# Patient Record
Sex: Male | Born: 1935 | Race: White | Hispanic: No | Smoking: Former smoker
Health system: Southern US, Community
[De-identification: ages and names within clinical notes are randomized; demographics above are authoritative.]

## PROBLEM LIST (undated history)

## (undated) DIAGNOSIS — L97409 Non-pressure chronic ulcer of unspecified heel and midfoot with unspecified severity: Secondary | ICD-10-CM

## (undated) DIAGNOSIS — I219 Acute myocardial infarction, unspecified: Secondary | ICD-10-CM

## (undated) DIAGNOSIS — I10 Essential (primary) hypertension: Secondary | ICD-10-CM

## (undated) DIAGNOSIS — N400 Enlarged prostate without lower urinary tract symptoms: Secondary | ICD-10-CM

## (undated) DIAGNOSIS — N183 Chronic kidney disease, stage 3 unspecified: Secondary | ICD-10-CM

## (undated) DIAGNOSIS — I739 Peripheral vascular disease, unspecified: Secondary | ICD-10-CM

## (undated) DIAGNOSIS — E7800 Pure hypercholesterolemia, unspecified: Secondary | ICD-10-CM

## (undated) DIAGNOSIS — I495 Sick sinus syndrome: Secondary | ICD-10-CM

## (undated) DIAGNOSIS — L97509 Non-pressure chronic ulcer of other part of unspecified foot with unspecified severity: Secondary | ICD-10-CM

## (undated) DIAGNOSIS — E119 Type 2 diabetes mellitus without complications: Secondary | ICD-10-CM

## (undated) DIAGNOSIS — J189 Pneumonia, unspecified organism: Secondary | ICD-10-CM

## (undated) DIAGNOSIS — I509 Heart failure, unspecified: Secondary | ICD-10-CM

## (undated) DIAGNOSIS — Z950 Presence of cardiac pacemaker: Secondary | ICD-10-CM

## (undated) DIAGNOSIS — E785 Hyperlipidemia, unspecified: Secondary | ICD-10-CM

## (undated) DIAGNOSIS — E1140 Type 2 diabetes mellitus with diabetic neuropathy, unspecified: Secondary | ICD-10-CM

## (undated) DIAGNOSIS — I2510 Atherosclerotic heart disease of native coronary artery without angina pectoris: Secondary | ICD-10-CM

## (undated) HISTORY — PX: INSERT / REPLACE / REMOVE PACEMAKER: SUR710

## (undated) HISTORY — PX: PACEMAKER INSERTION: SHX728

## (undated) HISTORY — DX: Hyperlipidemia, unspecified: E78.5

## (undated) HISTORY — PX: HERNIA REPAIR: SHX51

## (undated) HISTORY — DX: Type 2 diabetes mellitus with diabetic neuropathy, unspecified: E11.40

## (undated) HISTORY — PX: CARDIAC CATHETERIZATION: SHX172

## (undated) HISTORY — PX: CATARACT EXTRACTION W/ INTRAOCULAR LENS IMPLANT: SHX1309

## (undated) HISTORY — DX: Sick sinus syndrome: I49.5

## (undated) HISTORY — DX: Atherosclerotic heart disease of native coronary artery without angina pectoris: I25.10

## (undated) HISTORY — PX: CAROTID ENDARTERECTOMY: SUR193

## (undated) HISTORY — PX: CATARACT EXTRACTION, BILATERAL: SHX1313

## (undated) HISTORY — DX: Essential (primary) hypertension: I10

---

## 1994-01-21 HISTORY — PX: CORONARY ARTERY BYPASS GRAFT: SHX141

## 1999-02-04 ENCOUNTER — Inpatient Hospital Stay (HOSPITAL_COMMUNITY): Admission: EM | Admit: 1999-02-04 | Discharge: 1999-02-07 | Payer: Self-pay | Admitting: Cardiology

## 1999-08-17 ENCOUNTER — Encounter: Payer: Self-pay | Admitting: Internal Medicine

## 1999-08-17 ENCOUNTER — Inpatient Hospital Stay (HOSPITAL_COMMUNITY): Admission: EM | Admit: 1999-08-17 | Discharge: 1999-08-24 | Payer: Self-pay | Admitting: Emergency Medicine

## 2001-06-23 ENCOUNTER — Encounter: Payer: Self-pay | Admitting: *Deleted

## 2001-06-24 ENCOUNTER — Encounter (INDEPENDENT_AMBULATORY_CARE_PROVIDER_SITE_OTHER): Payer: Self-pay | Admitting: Specialist

## 2001-06-24 ENCOUNTER — Inpatient Hospital Stay (HOSPITAL_COMMUNITY): Admission: RE | Admit: 2001-06-24 | Discharge: 2001-06-26 | Payer: Self-pay | Admitting: *Deleted

## 2003-12-16 ENCOUNTER — Ambulatory Visit: Payer: Self-pay | Admitting: Cardiology

## 2004-03-23 ENCOUNTER — Ambulatory Visit: Payer: Self-pay | Admitting: Cardiology

## 2004-04-24 ENCOUNTER — Ambulatory Visit: Payer: Self-pay | Admitting: Cardiology

## 2004-05-24 ENCOUNTER — Ambulatory Visit: Payer: Self-pay | Admitting: Cardiology

## 2004-06-28 ENCOUNTER — Ambulatory Visit: Payer: Self-pay | Admitting: Cardiology

## 2004-08-06 ENCOUNTER — Ambulatory Visit: Payer: Self-pay | Admitting: Cardiology

## 2004-09-07 ENCOUNTER — Ambulatory Visit: Payer: Self-pay | Admitting: Cardiology

## 2004-10-12 ENCOUNTER — Ambulatory Visit: Payer: Self-pay | Admitting: Cardiology

## 2004-10-24 ENCOUNTER — Ambulatory Visit: Payer: Self-pay | Admitting: Cardiology

## 2004-11-12 ENCOUNTER — Ambulatory Visit: Payer: Self-pay | Admitting: Cardiology

## 2004-12-17 ENCOUNTER — Ambulatory Visit: Payer: Self-pay | Admitting: Cardiology

## 2005-01-17 ENCOUNTER — Ambulatory Visit: Payer: Self-pay | Admitting: Cardiology

## 2005-02-19 ENCOUNTER — Ambulatory Visit: Payer: Self-pay | Admitting: Cardiology

## 2005-03-22 ENCOUNTER — Ambulatory Visit: Payer: Self-pay | Admitting: Cardiology

## 2005-05-17 ENCOUNTER — Ambulatory Visit: Payer: Self-pay | Admitting: Cardiology

## 2005-06-13 ENCOUNTER — Ambulatory Visit: Payer: Self-pay | Admitting: Cardiology

## 2005-07-15 ENCOUNTER — Ambulatory Visit: Payer: Self-pay | Admitting: Cardiology

## 2005-08-12 ENCOUNTER — Ambulatory Visit: Payer: Self-pay | Admitting: Cardiology

## 2005-09-16 ENCOUNTER — Ambulatory Visit: Payer: Self-pay | Admitting: Cardiology

## 2005-10-14 ENCOUNTER — Ambulatory Visit: Payer: Self-pay | Admitting: Cardiology

## 2005-11-12 ENCOUNTER — Ambulatory Visit: Payer: Self-pay | Admitting: Cardiology

## 2005-12-10 ENCOUNTER — Ambulatory Visit: Payer: Self-pay | Admitting: Cardiology

## 2006-01-07 ENCOUNTER — Ambulatory Visit: Payer: Self-pay | Admitting: Cardiology

## 2006-02-04 ENCOUNTER — Ambulatory Visit: Payer: Self-pay | Admitting: Cardiology

## 2006-03-04 ENCOUNTER — Ambulatory Visit: Payer: Self-pay | Admitting: Cardiology

## 2006-04-01 ENCOUNTER — Ambulatory Visit: Payer: Self-pay | Admitting: Cardiology

## 2006-04-22 ENCOUNTER — Ambulatory Visit: Payer: Self-pay | Admitting: Cardiology

## 2006-04-29 ENCOUNTER — Ambulatory Visit: Payer: Self-pay | Admitting: Cardiology

## 2006-05-27 ENCOUNTER — Ambulatory Visit: Payer: Self-pay | Admitting: Cardiology

## 2006-06-24 ENCOUNTER — Ambulatory Visit: Payer: Self-pay | Admitting: Cardiology

## 2006-07-22 ENCOUNTER — Ambulatory Visit: Payer: Self-pay | Admitting: Cardiology

## 2006-08-19 ENCOUNTER — Ambulatory Visit: Payer: Self-pay | Admitting: Cardiology

## 2006-09-16 ENCOUNTER — Ambulatory Visit: Payer: Self-pay | Admitting: Cardiology

## 2006-10-14 ENCOUNTER — Ambulatory Visit: Payer: Self-pay | Admitting: Cardiology

## 2006-11-11 ENCOUNTER — Ambulatory Visit: Payer: Self-pay | Admitting: Cardiology

## 2006-12-09 ENCOUNTER — Ambulatory Visit: Payer: Self-pay | Admitting: Cardiology

## 2007-01-06 ENCOUNTER — Ambulatory Visit: Payer: Self-pay | Admitting: Cardiology

## 2007-02-02 ENCOUNTER — Ambulatory Visit: Payer: Self-pay | Admitting: Cardiology

## 2007-04-22 ENCOUNTER — Ambulatory Visit: Payer: Self-pay | Admitting: Cardiology

## 2007-04-28 ENCOUNTER — Ambulatory Visit: Payer: Self-pay

## 2007-05-05 ENCOUNTER — Ambulatory Visit: Payer: Self-pay | Admitting: Cardiology

## 2007-05-12 ENCOUNTER — Ambulatory Visit: Payer: Self-pay | Admitting: Cardiology

## 2007-08-04 ENCOUNTER — Ambulatory Visit: Payer: Self-pay | Admitting: Cardiology

## 2007-10-20 ENCOUNTER — Ambulatory Visit: Payer: Self-pay | Admitting: Cardiology

## 2007-11-03 ENCOUNTER — Ambulatory Visit: Payer: Self-pay | Admitting: Cardiology

## 2008-02-02 ENCOUNTER — Ambulatory Visit: Payer: Self-pay | Admitting: Cardiology

## 2008-04-15 DIAGNOSIS — E119 Type 2 diabetes mellitus without complications: Secondary | ICD-10-CM | POA: Insufficient documentation

## 2008-04-15 DIAGNOSIS — I495 Sick sinus syndrome: Secondary | ICD-10-CM

## 2008-04-15 DIAGNOSIS — E1142 Type 2 diabetes mellitus with diabetic polyneuropathy: Secondary | ICD-10-CM | POA: Insufficient documentation

## 2008-04-15 DIAGNOSIS — I2510 Atherosclerotic heart disease of native coronary artery without angina pectoris: Secondary | ICD-10-CM | POA: Insufficient documentation

## 2008-04-15 DIAGNOSIS — I10 Essential (primary) hypertension: Secondary | ICD-10-CM | POA: Insufficient documentation

## 2008-04-15 DIAGNOSIS — E785 Hyperlipidemia, unspecified: Secondary | ICD-10-CM | POA: Insufficient documentation

## 2008-04-26 ENCOUNTER — Encounter: Payer: Self-pay | Admitting: Cardiology

## 2008-04-26 ENCOUNTER — Ambulatory Visit: Payer: Self-pay | Admitting: Cardiology

## 2008-05-03 ENCOUNTER — Ambulatory Visit: Payer: Self-pay | Admitting: Cardiology

## 2008-05-09 ENCOUNTER — Encounter: Payer: Self-pay | Admitting: Cardiology

## 2008-08-02 ENCOUNTER — Ambulatory Visit: Payer: Self-pay | Admitting: Cardiology

## 2008-09-01 ENCOUNTER — Encounter (INDEPENDENT_AMBULATORY_CARE_PROVIDER_SITE_OTHER): Payer: Self-pay | Admitting: *Deleted

## 2008-11-01 ENCOUNTER — Ambulatory Visit: Payer: Self-pay | Admitting: Cardiology

## 2008-11-01 DIAGNOSIS — R609 Edema, unspecified: Secondary | ICD-10-CM | POA: Insufficient documentation

## 2008-11-01 DIAGNOSIS — R42 Dizziness and giddiness: Secondary | ICD-10-CM | POA: Insufficient documentation

## 2009-01-31 ENCOUNTER — Ambulatory Visit: Payer: Self-pay | Admitting: Cardiology

## 2009-04-28 ENCOUNTER — Ambulatory Visit: Payer: Self-pay | Admitting: Cardiology

## 2009-05-02 ENCOUNTER — Ambulatory Visit: Payer: Self-pay | Admitting: Cardiology

## 2009-08-01 ENCOUNTER — Ambulatory Visit: Payer: Self-pay | Admitting: Cardiology

## 2009-08-17 ENCOUNTER — Encounter (INDEPENDENT_AMBULATORY_CARE_PROVIDER_SITE_OTHER): Payer: Self-pay | Admitting: *Deleted

## 2009-09-11 ENCOUNTER — Encounter (INDEPENDENT_AMBULATORY_CARE_PROVIDER_SITE_OTHER): Payer: Self-pay | Admitting: *Deleted

## 2009-10-20 ENCOUNTER — Encounter: Payer: Self-pay | Admitting: Cardiology

## 2009-10-31 ENCOUNTER — Ambulatory Visit: Payer: Self-pay | Admitting: Cardiology

## 2009-11-17 ENCOUNTER — Encounter: Payer: Self-pay | Admitting: Cardiology

## 2009-11-17 ENCOUNTER — Ambulatory Visit: Payer: Self-pay | Admitting: Cardiology

## 2009-11-23 ENCOUNTER — Encounter: Payer: Self-pay | Admitting: Cardiology

## 2009-11-23 LAB — CONVERTED CEMR LAB
BUN: 31 mg/dL — ABNORMAL HIGH (ref 6–23)
CO2: 23 meq/L (ref 19–32)
Calcium: 8.4 mg/dL (ref 8.4–10.5)
Chloride: 104 meq/L (ref 96–112)
Creatinine, Ser: 1.39 mg/dL (ref 0.40–1.50)
Glucose, Bld: 99 mg/dL (ref 70–99)
Potassium: 5.8 meq/L — ABNORMAL HIGH (ref 3.5–5.3)
Sodium: 140 meq/L (ref 135–145)

## 2010-01-02 ENCOUNTER — Encounter: Payer: Self-pay | Admitting: Cardiology

## 2010-01-30 ENCOUNTER — Encounter: Payer: Self-pay | Admitting: Internal Medicine

## 2010-02-13 ENCOUNTER — Ambulatory Visit: Payer: Self-pay | Admitting: Cardiology

## 2010-02-14 ENCOUNTER — Ambulatory Visit: Payer: Self-pay | Admitting: Cardiology

## 2010-02-22 NOTE — Cardiovascular Report (Signed)
Summary: TTM   TTM   Imported By: Roderic Ovens 10/19/2009 16:12:39  _____________________________________________________________________  External Attachment:    Type:   Image     Comment:   External Document

## 2010-02-22 NOTE — Letter (Signed)
Summary: Device-Delinquent Check  [facility], Main Office  [address]   [place], [place] [address]   Phone: [phone]  Fax: [phone]     September 11, 2009 MRN: [id number]   IZYAN EZZELL [address] [place], [place]  [address]   Dear Mr. HALLQUIST,  According to our records, you have not had your implanted device checked in the recommended period of time.  We are unable to determine appropriate device function without checking your device on a regular basis.  Please call our office to schedule an appointment, with Dr. Juanda Chance,  as soon as possible.  If you are having your device checked by another physician, please call us so that we may update our records.  Thank you,  Altha Harm, LPN  September 11, 2009 9:09 AM  [facility]  certified mail

## 2010-02-22 NOTE — Cardiovascular Report (Signed)
Summary: Certified Letter Signed - Patient (Appt. Scheduled 11/17/09)  Certified Letter Signed - Patient (Appt. Scheduled 11/17/09)   Imported By: Debby Freiberg 11/20/2009 15:59:19  _____________________________________________________________________  External Attachment:    Type:   Image     Comment:   External Document

## 2010-02-22 NOTE — Letter (Signed)
Summary: Device-Delinquent Phone Journalist, newspaper, Main Office  [address]   [place], [place] [address]   Phone: [phone]  Fax: [phone]     August 17, 2009 MRN: [id number]   ALBERT HERSCH [address] [place], [place]  [address]   Dear Mr. ANSELMI,  According to our records, you were scheduled for a device phone transmission on                                 12-21-2008.  We did not receive any results from this check.  If you transmitted on your scheduled day, please call us to help troubleshoot your system.  If you forgot to send your transmission, please send one upon receipt of this letter.  Thank you,   Architectural technologist [facility]

## 2010-02-22 NOTE — Cardiovascular Report (Signed)
Summary: TTM   TTM   Imported By: Roderic Ovens 01/17/2010 15:42:15  _____________________________________________________________________  External Attachment:    Type:   Image     Comment:   External Document

## 2010-02-22 NOTE — Assessment & Plan Note (Signed)
Summary: per check out/sf   Visit Type:  1 yr f/u Primary Provider:  Ammie Ferrier  CC:  sob and pt states he gets exhausted easily.Marland Kitchendenies any other complaints today.  History of Present Illness: [name] comes in today for further evaluation and management of his coronary artery disease, history of hypertension, dizzy spells, history of lower extremity edema, and permanent pacemaker.  His dizzy spells persist the point that he stopped his amlodipine. His blood pressures are running in the 80s. Gen. running in the 120s. His dizziness has improved.  He also discontinued his diuretic. He's had no edema.  He's very limited and getting around because of diabetic neuropathy. His son lives next to exam as well as his granddaughter. His wife is more disabled and him. He is still very sharp mentally however.  He has dyspnea on exertion but no angina. Symptoms are fairly stable. He denies any syncope or tachypalpitations.  Current Medications (verified): 1)  Glipizide Xl 5 Mg Tb24 (Glipizide) .Marland Kitchen.. 1 Tablet By Mouth Two Times A Day 2)  Simvastatin 40 Mg Tabs (Simvastatin) .... Take One Tablet By Mouth At Bedtime 3)  Doxazosin Mesylate 4 Mg Tabs (Doxazosin Mesylate) .... Take  1 Tab At Bedtime 4)  Metoprolol Tartrate 50 Mg Tabs (Metoprolol Tartrate) .... Take 1/2  Tablet By Mouth Twice A Day 5)  Aspirin Ec 325 Mg Tbec (Aspirin) .... Take One Tablet By Mouth Daily  Allergies: 1)  ! Ace Inhibitors  Past History:  Past Medical History: Last updated: 05/02/2008 HYPERLIPIDEMIA (ICD-272.4) HYPERTENSION (ICD-401.9) DM (ICD-250.00) DIABETIC PERIPHERAL NEUROPATHY (ICD-250.60) PACEMAKER, PERMANENT (ICD-V45.01) CAD (ICD-414.00)    Past Surgical History: Last updated: 02-May-2008 left carotid endarterectomy in 2003 by Dr. Liliane Bade  Family History: Last updated: 02-May-2008  His mother passed away of coronary artery disease at 68. Father passed away of a stroke at 77.  He has two brothers  who have coronary artery disease with bypass surgeries in the past.  Social History: Last updated: 05-02-08 Retired  Married  Tobacco Use - Former.  1983 approx Alcohol Use - no  Risk Factors: Smoking Status: quit (05/02/08)  Review of Systems       negative history of present illness  Vital Signs:  Patient profile:   77 year old male Height:      70 inches Weight:      211 pounds Pulse (ortho):   64 / minute Pulse rhythm:   regular BP sitting:   128 / 70  (left arm) Cuff size:   large  Vitals Entered By: Danielle Rankin, CMA (April 28, 2009 3:30 PM)  Physical Exam  General:  obese.  sitting in a wheelchair Head:  normocephalic and atraumatic Eyes:  PERRLA/EOM intact; conjunctiva and lids normal. Mouth:  Teeth, gums and palate normal. Oral mucosa normal. Neck:  Neck supple, no JVD. No masses, thyromegaly or abnormal cervical nodes. Lungs:  Clear bilaterally to auscultation and percussion. Heart:  PMI nondisplaced, soft S1-S2, no gallop or rub Msk:  decreased ROM.   Pulses:  barely palpable in the lower extremities. His toes are cool with decreased capillary reflex. There is no sign of ulcers or infection. Extremities:  he has no edema today. Neurologic:  Alert and oriented x 3. Skin:  Intact without lesions or rashes. Psych:  Normal affect.   EKG  Procedure date:  04/28/2009  Findings:      normal sinus rhythm, ventricular pacing, rate 64 beats per minute, occasional PVC  PPM Specifications Following  MD:  Everardo Beals Juanda Chance, MD     Referring MD:  Trig Mcbryar PPM Vendor:  Medtronic     PPM Model Number:  [id number]     PPM Serial Number:  [id number] PPM DOI:  08/23/1999     PPM Implanting MD:  Everardo Beals. Juanda Chance, MD  Lead 1    Location: RA     DOI: 08/23/1999     Model #: [id number]     Serial #: [id number]     Status: active Lead 2    Location: RV     DOI: 08/23/1999     Model #: [id number]     Serial #: [id number]     Status: active   Indications:  SSS   Impression &  Recommendations:  Problem # 1:  CAD (ICD-414.00) Assessment Unchanged  The following medications were removed from the medication list:    Amlodipine Besylate 5 Mg Tabs (Amlodipine besylate) .Marland Kitchen... 1 once daily His updated medication list for this problem includes:    Metoprolol Tartrate 50 Mg Tabs (Metoprolol tartrate) .Marland Kitchen... Take 1/2  tablet by mouth twice a day    Aspirin Ec 325 Mg Tbec (Aspirin) .Marland Kitchen... Take one tablet by mouth daily  Orders: EKG w/ Interpretation (93[address])  Problem # 2:  PACEMAKER, PERMANENT (ICD-V45.01) Assessment: Unchanged We will have his pacemaker interrogated today.interrogation demonstrates 22 months of battery life.  Problem # 3:  EDEMA (ICD-782.3) Assessment: Improved Continue not to take diuretic  Problem # 4:  HYPERTENSION (ICD-401.9) Assessment: Improved continue That cannot take diuretic or amlodipine. The following medications were removed from the medication list:    Amlodipine Besylate 5 Mg Tabs (Amlodipine besylate) .Marland Kitchen... 1 once daily    Hydrochlorothiazide 25 Mg Tabs (Hydrochlorothiazide) .Marland Kitchen... Take one tablet by mouth daily. His updated medication list for this problem includes:    Doxazosin Mesylate 4 Mg Tabs (Doxazosin mesylate) .Marland Kitchen... Take  1 tab at bedtime    Metoprolol Tartrate 50 Mg Tabs (Metoprolol tartrate) .Marland Kitchen... Take 1/2  tablet by mouth twice a day    Aspirin Ec 325 Mg Tbec (Aspirin) .Marland Kitchen... Take one tablet by mouth daily  Patient Instructions: 1)  Your physician recommends that you schedule a follow-up appointment in: YEAR WITH DR Lowen Barringer 2)  Your physician recommends that you continue on your current medications as directed. Please refer to the Current Medication list given to you today.

## 2010-02-22 NOTE — Assessment & Plan Note (Signed)
Summary: rov   Visit Type:  rov Primary Dazia Lippold:  Ammie Ferrier  CC:  edema/legs...sob w/exertion....denies any cp.  History of Present Illness: [name] is an 77 yo m that comes in today for further evaluation and management of his coronary artery disease, history of hypertension, dizzy spells, history of lower extremity edema, and permanent pacemaker.  he is off his BP meds given that he was hypotensive. He also discontinued his diuretic. He's had no edema.  He's very limited and getting around because of diabetic neuropathy. His son lives next to him as well as his granddaughter. His wife is more disabled and him. He is still very sharp mentally however.  Patient is feeling well and denies CP, abdominal pain, nausea, vomiting, HA's, palpitations, blurred vision. fever, chills, diarrhea, constipation or SOB.    Current Medications (verified): 1)  Glipizide Xl 5 Mg Tb24 (Glipizide) .Marland Kitchen.. 1 Tablet By Mouth Two Times A Day 2)  Simvastatin 40 Mg Tabs (Simvastatin) .... Take One Tablet By Mouth At Bedtime 3)  Metoprolol Tartrate 50 Mg Tabs (Metoprolol Tartrate) .... Take 1/2 Tab Once Daily 4)  Aspirin Ec 325 Mg Tbec (Aspirin) .... Take One Tablet By Mouth Daily 5)  Flomax 0.4 Mg Caps (Tamsulosin Hcl) .Marland Kitchen.. 1 Cap At Bedtime  Allergies: 1)  ! Ace Inhibitors  Past History:  Past Medical History: Last updated: 04/15/2008 HYPERLIPIDEMIA (ICD-272.4) HYPERTENSION (ICD-401.9) DM (ICD-250.00) DIABETIC PERIPHERAL NEUROPATHY (ICD-250.60) PACEMAKER, PERMANENT (ICD-V45.01) CAD (ICD-414.00)    Review of Systems       negative except per HPI  Vital Signs:  Patient profile:   77 year old male Height:      70 inches Weight:      213.4 pounds BMI:     30.73 Pulse rate:   66 / minute Pulse rhythm:   regular BP sitting:   146 / 82  (left arm) Cuff size:   large  Vitals Entered By: Danielle Rankin, CMA (November 17, 2009 2:21 PM)  Physical Exam  General:  obese.  sitting in a  wheelchair Lungs:  Clear bilaterally to auscultation and percussion. Heart:  PMI nondisplaced, soft S1-S2, no gallop or rub Pulses:  barely palpable in the lower extremities. His toes are cool with decreased capillary reflex. There is no sign of ulcers or infection. Extremities:  he has no edema today. Neurologic:  Alert and oriented x 3.   PPM Specifications Following MD:  Everardo Beals. Juanda Chance, MD     Referring MD:  WALL PPM Vendor:  Medtronic     PPM Model Number:  [id number]     PPM Serial Number:  [id number] PPM DOI:  08/23/1999     PPM Implanting MD:  Everardo Beals. Juanda Chance, MD  Lead 1    Location: RA     DOI: 08/23/1999     Model #: [id number]     Serial #: [id number]     Status: active Lead 2    Location: RV     DOI: 08/23/1999     Model #: 6578     Serial #: [id number]     Status: active   Indications:  SSS   PPM Follow Up Battery Voltage:  2.73 V     Battery Est. Longevity:  16 mths       PPM Device Measurements [facility]  Amplitude: 2.80 mV, Impedance: 513 ohms, Threshold: 1.00 V at 0.40 msec Right Ventricle  Amplitude: 8.00 mV, Impedance: 504 ohms, Threshold: 1.00 V at 0.40 msec  Episodes MS Episodes:  0     Ventricular High Rate:  0     Atrial Pacing:  56.6%     Ventricular Pacing:  99.9%  Parameters Mode:  DDD     Lower Rate Limit:  60     Upper Rate Limit:  120 Paced AV Delay:  150     Sensed AV Delay:  130 Next Cardiology Appt Due:  05/22/2010 Tech Comments:  NORMAL DEVICE FUNCTION.  NO CHANGES MADE.  BATTERY LONGEVITY 16 MTHS.  MEDNET TTMs 3 MTHS AND ROV IN 6 MTHS W/DEVICE CLINIC. Vella Kohler  November 17, 2009 2:41 PM  Impression & Recommendations:  Problem # 1:  PACEMAKER, PERMANENT (ICD-V45.01) Assessment Comment Only We will have his pacemaker interrogated today.interrogation demonstrates 16 months of battery life. will consolidate his cardiology visits to only Dr. Johney Frame.   Problem # 2:  HYPERTENSION (ICD-401.9) Assessment: Comment Only bp slightly elevated, will give  HCTZ. and check BMET.   The following medications were removed from the medication list:    Doxazosin Mesylate 4 Mg Tabs (Doxazosin mesylate) .Marland Kitchen... Take  1 tab at bedtime His updated medication list for this problem includes:    Metoprolol Tartrate 50 Mg Tabs (Metoprolol tartrate) .Marland Kitchen... Take 1/2 tab once daily    Aspirin Ec 325 Mg Tbec (Aspirin) .Marland Kitchen... Take one tablet by mouth daily    Hydrochlorothiazide 12.5 Mg Tabs (Hydrochlorothiazide) .Marland Kitchen... Take one tablet by mouth daily.  Orders: EKG w/ Interpretation (93000) T-Basic Metabolic Panel ([address])  Problem # 3:  HYPERLIPIDEMIA (ICD-272.4) Assessment: Comment Only Well controlled on current treatment, No new changes made today, Will continue to monitor.   His updated medication list for this problem includes:    Simvastatin 40 Mg Tabs (Simvastatin) .Marland Kitchen... Take one tablet by mouth at bedtime  His updated medication list for this problem includes:    Simvastatin 40 Mg Tabs (Simvastatin) .Marland Kitchen... Take one tablet by mouth at bedtime  Problem # 4:  CAD (ICD-414.00) Assessment: Comment Only stable.   His updated medication list for this problem includes:    Metoprolol Tartrate 50 Mg Tabs (Metoprolol tartrate) .Marland Kitchen... Take 1/2 tab once daily    Aspirin Ec 325 Mg Tbec (Aspirin) .Marland Kitchen... Take one tablet by mouth daily  Orders: EKG w/ Interpretation (93000) T-Basic Metabolic Panel ([address]-91478)  Patient Instructions: 1)  Start Hydrochlorothiazide ( HCTZ) 12.5mg  once daily. 2)  Labwork today: bmet 3)  Labwork at Dr. Neita Garnet office in 1 week: bmet 4)  Your physician wants you to follow-up in:  1 year with Dr. Johney Frame. You will receive a reminder letter in the mail two months in advance. If you don't receive a letter, please call our office to schedule the follow-up appointment. Prescriptions: HYDROCHLOROTHIAZIDE 12.5 MG TABS (HYDROCHLOROTHIAZIDE) Take one tablet by mouth daily.  #30 x 11   Entered by:   Sherri Rad, RN, BSN   Authorized  by:   Lenoria Farrier, MD, [facility]   Signed by:   Sherri Rad, RN, BSN on 11/17/2009   Method used:   Electronically to        [facility] [id number]* (retail)       [address]       [place], [place]  [address]       Ph: [phone]       Fax: [phone]   RxID:   [id number]   Appended Document: rov faxed note to Dr. Mikey Bussing at [phone].

## 2010-02-22 NOTE — Cardiovascular Report (Signed)
Summary: TTM   TTM   Imported By: Roderic Ovens 08/04/2009 15:56:25  _____________________________________________________________________  External Attachment:    Type:   Image     Comment:   External Document

## 2010-02-22 NOTE — Cardiovascular Report (Signed)
Summary: TTM   TTM   Imported By: Roderic Ovens 02/28/2009 16:04:49  _____________________________________________________________________  External Attachment:    Type:   Image     Comment:   External Document

## 2010-02-22 NOTE — Medication Information (Signed)
Summary: Physician's Orders   Physician's Orders   Imported By: Roderic Ovens 12/01/2009 10:59:36  _____________________________________________________________________  External Attachment:    Type:   Image     Comment:   External Document

## 2010-02-28 NOTE — Cardiovascular Report (Signed)
Summary: TTM   TTM   Imported By: Roderic Ovens 02/16/2010 16:14:24  _____________________________________________________________________  External Attachment:    Type:   Image     Comment:   External Document

## 2010-04-26 ENCOUNTER — Ambulatory Visit (INDEPENDENT_AMBULATORY_CARE_PROVIDER_SITE_OTHER): Payer: Medicare Other | Admitting: *Deleted

## 2010-04-26 DIAGNOSIS — I442 Atrioventricular block, complete: Secondary | ICD-10-CM

## 2010-05-03 ENCOUNTER — Encounter: Payer: Self-pay | Admitting: Internal Medicine

## 2010-05-03 DIAGNOSIS — I495 Sick sinus syndrome: Secondary | ICD-10-CM

## 2010-05-31 ENCOUNTER — Encounter: Payer: Self-pay | Admitting: Internal Medicine

## 2010-05-31 DIAGNOSIS — I495 Sick sinus syndrome: Secondary | ICD-10-CM

## 2010-06-05 NOTE — Assessment & Plan Note (Signed)
[facility]                            CARDIOLOGY OFFICE NOTE   NAME:Housman, BRIGG CAPE                       MRN:          [id number]  DATE:05/12/2007                            DOB:          January 13, 1921    Mr. Whitner returns today for followup concerning a possibility of  posterior circulation TIAs.  Please see my previous note.   He has had no further events.  He has not gone up on his aspirin from 81  to 325.   His carotid Dopplers showed nonobstructive plaque in both carotids and  antegrade flow in both vertebrals.  I have reviewed these with him  today.   His meds are unchanged.   Blood pressure is 108/54, pulse is 66 and regular.  Weight is 223 down  4.  Rest of the exam is unchanged.   I had a nice talk with Mr. Calo.  I have asked him to increase his  aspirin 325 a day.  We also talked about Aggrenox, but he said he was  not going to afford that.  Will plan on seeing him back in 6 months.     Thomas C. Daleen Squibb, MD, [facility]  Electronically Signed    TCW/MedQ  DD: 05/12/2007  DT: 05/12/2007  Job #: [id number]   cc:   Wannetta Sender.

## 2010-06-05 NOTE — Assessment & Plan Note (Signed)
 HEALTHCARE                            CARDIOLOGY OFFICE NOTE   NAME:[name]                       MRN:          [id number]  DATE:10/20/2007                            DOB:          [birth year]    [name] returns today for further management of the following issues:  1. Coronary artery disease status post coronary artery bypass grafting      in 1996.  He has some occasional chest pain but very little.  He is      very limited with a walker.  2. Normal left ventricular systolic function.  3. Status post left carotid endarterectomy in 2003 by Dr. Liliane Bade.      Carotid Dopplers in April 2009 showed nonobstructive plaque with      antegrade flow in both vertebrals.  He has had no further symptoms      of posterior TIAs.  4. Sick sinus syndrome status post Medtronic Sigma DDD pacer in 2001.  5. Type 2 diabetes with peripheral neuropathy.  6. Hypertension.  7. Hyperlipidemia.  8. History of ACE intolerance.   His biggest concern is his wife today.  I saw her earlier.  She is  starting to have dementia and just got her with a prolonged  hospitalization with complicated cellulitis.   His meds are:  1. Amlodipine 10 mg a day.  2. Aspirin 325 mg a day.  3. Metoprolol 25 b.i.d.  4. Glipizide 5 mg p.o. b.i.d.  5. Allopurinol 300 mg a day.  6. Zocor 40 mg a day.  7. Doxazosin 4 mg a day.  8. Hydrochlorothiazide 25 mg a day.   PHYSICAL EXAMINATION:  VITAL SIGNS:  Blood pressure today is 102/58,  pulse 64 and regular, weight is 221, down 2.  HEENT:  Unremarkable.  NECK:  Carotid upstrokes are equal bilaterally with bilateral bruits.  Thyroid is not enlarged.  Trachea is midline.  LUNGS:  Clear.  HEART:  Regular rate and rhythm.  No gallop.  ABDOMEN:  Soft, good bowel sounds.  EXTREMITIES:  No significant edema.  Pulses are present but reduced.  No  sign of DVT.  NEURO:  Other than walking with a walker, it is intact.  He is alert and  oriented and still has his sense of humor.   ASSESSMENT/PLAN:  [name] is stable from our standpoint.  He  unfortunately is going back as did his wife.  He says he has already  picked out his graveside.   I have tried to encourage him today.  We have made no changes in his  medical program.  I will see him back again in 6 months.     Thomas C. Daleen Squibb, MD, [facility]  Electronically Signed    TCW/MedQ  DD: 10/20/2007  DT: 10/21/2007  Job #: [id number]   cc:   Wannetta Sender.

## 2010-06-05 NOTE — Assessment & Plan Note (Signed)
[facility]                            CARDIOLOGY OFFICE NOTE   NAME:Goodlow, [name]                       MRN:          [id number]  DATE:04/22/2007                            DOB:          [birth year]    [name] returns today for management of the following issues.  1. Coronary artery disease status post coronary bypass grafting in      1996.  He is asymptomatic.  He is very limited with a walker.  He      is having no angina.  He does have some dyspnea on exertion which      is stable.  2. Normal left ventricular systolic function.  3. Status post left carotid endarterectomy in 2003 by Dr. Liliane Bade.  4. Sick sinus syndrome status post Medtronic SIGMA DDD in 2001.  5. Diabetes with peripheral neuropathy.  6. Hypertension.  7. Hyperlipidemia.  8. History of ACE intolerance.   He is delighted it is spring.  He said he is sick and tired to sitting  inside all the time.  He is his usual humorous self.   He does relate some episodes of dizziness that just come out of the  blue.  They will last sometimes several minutes.  Sometimes he gets  visual blurring in both eyes.  He has had one episode where he was  having a little trouble speaking.   He has had a carotid endarterectomy but it has been a while since we  have had vertebral Dopplers.  He takes 81 mg of aspirin, Actos 30 mg a  day, HCTZ 25 mg a day, felodipine 10 mg a day, metoprolol 25 mg a day,  doxazosin 4 mg a day, Zocor 40 mg a day, allopurinol 300 mg a day.   He denies any orthopnea, PND or increased peripheral edema.  He has  about 1+ stable edema.   His blood pressure today was good at 127/58.  His pulse is 71.  His EKG  without the magnet shows him to have normal sinus rhythm with V pacing.  With the magnet, he has got AV pacing.  His weight is 227 which is  stable.  HEENT:  He is alert and oriented x3.  His speech is normal.  Facial  symmetry is normal.  PERRL.  Extraocular  movement is intact.  Sclerae  are clear.  NECK:  Supple.  Carotid upstrokes are equal bilaterally with a soft  bruit on the left.  There is a carotid endarterectomy scar on the left.  Thyroid is not enlarged.  Trachea is midline.  LUNGS:  Clear.  HEART:  Reveals a poorly appreciated PMI. He has a soft S1, S2 without  gallop.  ABDOMEN:  Soft, good bowel sounds.  No midline bruit.  EXTREMITIES:  1+ pitting edema.  There is no sign of DVT.  Pulses are  present but reduced.  NEURO:  Intact.  SKIN:  Marked for a few ecchymoses.   I am concerned [name] may be having some posterior circulation TIAs.  I have asked him to increase his aspirin to  325 mg a day and to return  for carotid Dopplers and to look at the vertebral flow as well.  In  addition, I have reviewed all his secondary preventative strategies  including blood sugar control, blood pressure control which is good  today and hyperlipidemia.  He is extremely compliant.  His daughter is  here to confirm this.   I will have him come back in 3-4 weeks to review these findings.     [name] C. Daleen Squibb, MD, [facility]  Electronically Signed    TCW/MedQ  DD: 04/22/2007  DT: 04/23/2007  Job #: [id number]   cc:   Wannetta Sender.

## 2010-06-05 NOTE — Assessment & Plan Note (Signed)
HEALTHCARE                            CARDIOLOGY OFFICE NOTE   NAME:[name], [name]                       MRN:          [id number]  DATE:04/26/2008                            DOB:          1920/12/17    [name] comes in today for followup.   PROBLEM LIST:  1. Coronary artery disease, status post coronary artery bypass      grafting in 1996.  He is asymptomatic.  He is still very limited      with his walker.  He is having no significant angina.  He has      dyspnea on exertion which is stable.  2. Normal left ventricular systolic function.  3. Status post left carotid endarterectomy in 2003.  4. Sick sinus syndrome, status post Medtronic Sigma dual-mode, dual-      pacing, dual-sensing (pacemaker) in 2001.  5. Type 2 diabetes with peripheral neuropathy.  6. Hypertension.  7. Hyperlipidemia.  8. History of ACE intolerance.   He is still somewhat depressed.  His biggest concern is that he can  hardly afford his wife's numerous medications.   He has very few episodes of the dizzy spells with transient visual  changes.  We were concerned in the past about this being a posterior  event and evaluated that and continued with aspirin 325 mg a day.   His carotid Dopplers were last done on April 28, 2007, which showed  nonobstructive disease bilaterally with a patent carotid endarterectomy  with the DPA on the left.  He has antegrade flow in both vertebrals.   CURRENT MEDICATIONS:  1. Glipizide XL 5 mg daily.  2. Simvastatin 40 mg at bedtime.  3. Doxazosin 4 mg a day.  4. Amlodipine 10 mg a day.  5. Metoprolol tartrate 50 mg 1/2 tablet by mouth twice a day.  6. Hydrochlorothiazide 25 mg per day.  7. Allopurinol 300 mg a day.  8. Aspirin 325 mg a day.   PHYSICAL EXAMINATION:  VITAL SIGNS:  His blood pressure is 124/60 in  left arm.  His pulse is 83 and regular.  His weight is 223.  SKIN:  Pale and dry.  HEENT:  Unchanged.  NECK:  Carotids are  full with a soft left carotid bruit.  Thyroid is not  enlarged.  Trachea is midline.  LUNGS:  Clear to auscultation and percussion.  HEART:  A nondisplaced PMI.  Sternotomy site is stable.  ABDOMEN:  Soft, good bowel sounds.  No obvious organomegaly.  EXTREMITIES:  No cyanosis or clubbing, but does reveal 1 to 2+ pitting  edema pretibially.  Pulses are intact.  NEUROLOGIC:  Grossly intact.   His electrocardiogram shows ventricular pacing at a rate of 77 beats per  minute.  He appears to be in sinus rhythm otherwise.   I had a nice chat with [name].  I have asked him to watch out for his  peripheral edema getting worse.  If that happens he could switch his  hydrochlorothiazide to Lasix.  At the present time, he would like to  just watch this.  He  says it comes and goes.   I have made no changes in medical program.  I will plan on seeing him  back in 6 months.     Thomas C. Daleen Squibb, MD, [facility]  Electronically Signed    TCW/MedQ  DD: 04/26/2008  DT: 04/27/2008  Job #: [id number]   cc:   Wannetta Sender.

## 2010-06-08 NOTE — Cardiovascular Report (Signed)
[place]. [facility]  Patient:    [name], [name]                       MRN: [id number] Adm. Date:  [id number] Attending:  Junious Silk CC:         Georgina Snell, M.D.             Thomas C. Wall, M.D. [facility]             Madolyn Frieze. Jens Som, M.D. [facility]                        Cardiac Catheterization  PROCEDURE:  Coronary arteriography.  INDICATION:  Recurrent bradycardia, chest pain.  [name] is status post CABG.  RESULTS:  Angiographic data: 1. His native left main coronary artery has a 70% diffuse disease. 2. Native LAD is 100% occluded proximally. 3. Native circumflex is 100% occluded proximally. 4. Native right coronary artery is 100% occluded proximally. 5. The left internal mammary artery is widely patent to the LAD. 6. The saphenous vein graft to the diagonal branch is widely patent.    Saphenous vein graft to the PDA is widely patent.  The saphenous vein graft    to the obtuse marginal-2 and 3 is widely patent, some collaterals to the    distal right coronary artery.  RAO ventriculography:  RAO ventriculography revealed normal wall motion. Ejection fraction is in excess of 60%.  There is no gradient across the aortic valve and no MR.  Hemodynamic data: 1. Aortic pressure was 140/62. 2. LV pressure was 140/18.  COMMENTS:  The patient had a lot of scar tissue in his right femoral artery and the perclose device was not used.  IMPRESSION:  The patient does not have a significant ischemic substraight. However, he continues to have bradycardia in the face of hyperkalemia.  This was discussed with Dr. Madolyn Frieze. [name] and he will have both the EP and renal consult.  I suspect that unless there is a clear cut etiology to the patients hyperkalemia he will need pacemaker therapy. DD:  08/20/99 TD:  08/20/99 Job: [id number]

## 2010-06-08 NOTE — Op Note (Signed)
[place]. [facility]  Patient:    [name], [name]                       MRN: [id number] Proc. Date: 08/23/99 Adm. Date:  [id number] Attending:  Junious Silk CC:         Lindwood Qua, M.D., [place], [place]             Jesse Sans. Daleen Squibb, M.D. [facility]/Cardiopulmonary Laboratory             Nathen May, M.D., [facility]/Bruce R. Juanda Chance, M.D. L                           Operative Report  HISTORY: Mr. Min is a 77 year old with previous bypass surgery, hypertension, diabetes, and renal insufficiency when on ACE inhibitors.  He also has had intermittent hyperkalemia and bradycardia down in the 30s, from which he gets weak and symptomatic.  It was felt that his bradycardia was not related to his borderline high potassiums and the decision was made to implant a permanent pacemaker.  OPERATION/PROCEDURE: Implantation of Medtronics Sigma DDD pacemaker (model [id number], serial [id number]), with a Medtronics bipolar screw-in ventricular lead (model [id number], serial [id number]), and Medtronics bipolar screw-in atrial lead (model #5076 45 cm, serial #[id number]).  CARDIOLOGISTEverardo Beals Juanda Chance, M.D.  INDICATIONS FOR PROCEDURE: Marked bradycardia with symptoms.  ANESTHESIA: Local 1% local xylocaine.  ESTIMATED BLOOD LOSS: Less than 20 cc.  COMPLICATIONS: None.  DESCRIPTION OF PROCEDURE: The procedure was performed in operating room #3. The right anterior chest was prepped and draped in the usual fashion.  The skin and subcutaneous tissue were anesthetized with 1% local xylocaine.  Using an 18 gauge thin-wall needle the subclavian vein was entered with a standard stick and accessed with a 0.38 wire.  An incision was made below the clavicle and extended to the prepectoral fascia.  A pocket was made inferior to the incision using blunt dissection.  Using two 9 French sheaths the atrial and ventricular leads were passed to the right atrium.  A - line was left  in the subclavian vein for later access.  A figure-of-eight suture was placed at the site for hemostasis and for later screw-in leads.  The ventricular lead was screwed into the right ventricular apex with good pacing parameters as described below.  The atrial lead was screwed into the lateral atrial wall with good pacing parameters as described below.  After removal of the stylettes and the 0.38 wire the figure-of-eight suture was secured at the entry site.  The leads were attached to the prepectoral fascia with two sutures of 1-0 silk and running Silastic collar.  The pocket was irrigated with sterile ganamycin solution and the leads were attached to the generator with the hexagonal wrench, and the generator was implanted into the pocket and secured loosely to the prepectoral fascia with 1-0 silk.  The subcutaneous tissue was closed with running 2-0 Dexon.  The skin was closed with running 5-0 Dexon.  PACING PARAMETERS: Ventricular unipolar R wave 3.6 mV.  Mem/capture 0.5 V. Ventricular bipolar: R wave 10.0 mV.  Mem/capture 0.6 V.  Resistance 838 ohms.  Atrial unipolar: P wave 2.0 mV.  Mem/capture 1.1 V.  Resistance 706 ohms.  Atrial bipolar: P wave 2.2 mV.  Mem/capture 1.1 V.  Resistance 701 ohms.  There was no pacing in the diaphragm in either the unipolar or bipolar mode at  10 V on either lead.  The patient tolerated the procedure well and left the laboratory in satisfactory condition, tracking in the atrium and pacing the ventricle. D:  08/23/99 TD:  08/23/99 Job: [id number]

## 2010-06-08 NOTE — Discharge Summary (Signed)
[name]. [facility]  Patient:    [name]                        MRN: [id number] Adm. Date:  [id number] Disc. Date: [id number] Attending:  Junious Silk Dictator:   Lavella Hammock, P.A. CC:         Dr. Lindwood Qua, [place], [place]             Jesse Sans. Wall, M.D. [facility]                           Discharge Summary  DATE OF BIRTH:  19-May-1920.  PROCEDURES:  Persantine Cardiolite.  HOSPITAL COURSE:  Mr.  [name] is a 77 year old male with a history of non-insulin-dependent diabetes and coronary artery disease who presented on the day of admission with profound junctional bradycardia and a creatinine that had gone from 1.2 to 2.4.  His potassium was also elevated at 6.2.  He was treated with insulin and glucose and transported from [facility] where he presented to  [facility] for further evaluation.  He had been on lisinopril prior to  admission and his dose had been increased from 2.5 mg to 5 mg a few weeks prior to admission.  Lisinopril was discontinued and he was hydrated.  A renal ultrasound was ordered.  His heart rate had been in the 30s but he was maintaining his blood pressure and it was felt that he should be watched carefully and not paced unless he became symptomatic.  If his heart rate continued on after his electrolyte imbalance was corrected, then he would need a pacemaker.  His potassium upon repeat check was still greater than 6 and he was given Kayexalate and sorbitol x 1. The next day, his heart rate was in the 70s, his potassium was 4.4 and his creatinine was 2.1 with hydration.  He had had presyncope with the low heart rate but once his heart rate became within normal limits, he had no further symptoms. An echocardiogram was done which showed mildly symmetric LVH with a grossly normal left ventricular ejection fraction and mild aortic sclerosis.  There was also mild tricuspid regurgitation and  trace mitral regurgitation.  He had enzymes checked for MI, but these were negative.  He had a renal ultrasound and this showed normal renal size in echo texture with no hydronephrosis.  His urinary bladder was distended with a possible bladder outlet obstruction.  He had a post-void residual catheterization done and only a 100 cc was obtained, so he did not have a Foley  catheter.  He had a Persantine Cardiolite which showed no ischemia and an injection fraction of 56%.  By February 07, 1999, he was asymptomatic as far as his presyncope went and he had no shortness of breath and no chest pain.  He had no significant pauses or arrhythmias either.  His BUN and creatinine had normalized and he was  feeling much better.  He was considered stable for discharge on February 07, 1999.  LABORATORY VALUES:  Hemoglobin 13.9, hematocrit 40.2, WBCs 9.2, platelets 220.  K+ on admission 5.9, 4.2 at discharge.  BUN 42 on admission, 23 at discharge. Creatinine 2.9 on admission, 1.5 at discharge.  Serial CK-MB and troponin are negative for MI.  TSH 1.98.  Chest x-ray:  Small left effusion or scarring and left lower lobe atelectasis, similar to the prior  study.  Cardiolite study:  Negative for pharmacologic stress induced ischemia with an ejection fraction of 56%.  DISCHARGE CONDITION:  Improved.  CONSULTS:  None.  COMPLICATIONS:  None.  DISCHARGE DIAGNOSES: 1. Renal insufficiency. 2. Bradycardia. 3. Presyncope. 4. History of coronary artery disease with aortocoronary bypass surgery in 1996. 5. Bilateral internal carotid artery stenosis. 6. Atypical chest pain. 7. Non-insulin-dependent diabetes mellitus. 8. Hypertension.  DISCHARGE INSTRUCTIONS: 1. His activity level is to be as tolerated. 2. He is to stick to a low-fat diabetic diet. 3. He is not to take ACE inhibitors. 4. He is to follow up with Dr. Mikey Bussing as needed. 5. He has an appointment with Dr. Daleen Squibb on March 02, 1999 at  9:45 a.m.  DISCHARGE MEDICATIONS: 1. Serzone 150 mg one-half tablet q.d. 2. Coated aspirin 81 mg q.d. 3. Glucotrol XL 10 mg two tablets q.d. 4. Glucophage 500 mg two tablets b.i.d. 5. Centrum Silver multivitamin q.d. DD:  02/22/99 TD:  02/22/99 Job: [id number]

## 2010-06-08 NOTE — Assessment & Plan Note (Signed)
[facility]                            CARDIOLOGY OFFICE NOTE   NAME:Elmquist, CATHY ROPP                       MRN:          [id number]  DATE:04/22/2006                            DOB:          [birth year]    Mr. Wimmer returns today for management of the following of the issues:  1. Coronary artery disease status post coronary artery bypass grafting      in 1996. His last Myoview was in 2004. He is asymptomatic. He is      fairly limited in his mobility.  2. Good left ventricular systolic function.  3. Status post left carotid endarterectomy in 2003.  4. Sick sinus syndrome status post Medtronic SIGMA DDD pacer in 2001.      That was checked today by our pacer techs and it checked out      normal. No changes were made.  5. Diabetes with peripheral neuropathy.  6. Hypertension.  7. Hyperlipidemia.  8. History of ACE INTOLERANCE.   He is doing pretty well other than a cold.   His medications are unchanged since last visit.   His blood pressure today is 137/66, pulse is 71. Weight is 227.  HEENT: Normocephalic, atraumatic. PERRLA. Extra-ocular movements intact.  Sclerae are clear. Facial symmetry is normal.  LUNGS:  Are clear to auscultation and percussion.  Carotids are full. There are no bruits.  HEART: Reveals a normal S1, S2. PMI was difficult to appreciate.  Sternotomy site is stable.  ABDOMEN: Protuberant,  with good bowel sounds.  EXTREMITIES: Reveals trace edema. Pulses were decreased, but present.   Electrocardiogram shows normal sinus rhythm with ventricular pacing.   ASSESSMENT/PLAN:  Mr. Bathgate is doing well. I have made no changes in his  program. With his decreased functional capacity, we will forgo a stress  Myoview. Will see him back in a year.     Thomas C. Daleen Squibb, MD, [facility]  Electronically Signed    TCW/MedQ  DD: 04/22/2006  DT: 04/22/2006  Job #: [id number]   cc:   Wannetta Sender.

## 2010-06-08 NOTE — H&P (Signed)
[place]. [facility]  Patient:    [name], [name] Visit Number: [id number] MRN: [id number]          Service Type: SUR Location: 2000 2029 01 Attending Physician:  Melvenia Needles Dictated by:   Dominica Severin, P.A. Admit Date:  06/24/2001 Discharge Date: 06/26/2001   CC:         CVTS office   History and Physical  DATE OF BIRTH:  06/18/1920  CHIEF COMPLAINT:  Left ECCVOD.  HISTORY OF PRESENT ILLNESS:  This is a 77 year old Caucasian male who is referred from Dr. Daleen Squibb for evaluation of left internal carotid artery stenosis.  The patient has been having symptoms of dizziness, speech changes, vision changes, and confusion which started approximately six weeks ago.  He has had three episodes since then.  He describes speech difficulties as well as sensory difficulties and vision changes, as well as weakness, which lasted about 15-20 minutes and then resolved.  He has had no prior history of this.  PAST MEDICAL HISTORY: 1. Coronary artery disease, status post coronary artery bypass graft surgery,    1996, by Dr. Tyrone Sage. 2. History of arrhythmia, status post pacemaker implantation in 2001. 3. Hypertension. 4. Hyperlipidemia. 5. Diabetes mellitus, type 2. 6. Peripheral neuropathy with pain in his feet.  ALLERGIES:  He has an intolerance to ACE INHIBITORS, causing renal insufficiency, and BETA BLOCKERS.  MEDICATIONS: 1. Glucotrol 10 mg daily. 2. Actos 30 mg daily. 3. Serzone 75 mg daily. 4. Zocor 20 mg daily. 5. Feldene 10 mg daily. 6. Hydrochlorothiazide 12.5 mg daily. 7. Centrum daily. 8. Aspirin 81 mg daily.  SOCIAL HISTORY:  The patient has been married for 60 years.  He has two children who are 65 and 54.  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  He denies any alcohol use.  He has a remote history of smoking.  He quit 20 years ago.  He lives with his wife in a ranch style home.  He does drive.   He does not have any assistance available at home.  FAMILY HISTORY:  His mother passed away of coronary artery disease at 45. Father passed away of a stroke at 68.  He has two brothers who have coronary artery disease with bypass surgeries in the past.  REVIEW OF SYSTEMS:  Generally the patient denies any fever or chills, weight loss, weight gain, or increased fatigue.  HEENT:  He denies any vision troubles, hearing changes, difficulty swallowing or chewing, neck pain or stiffness.  He wears eyeglasses as well as dentures and hearing aids. PULMONARY:  He does get short of breath with exertion, denies any shortness of breath at rest, paroxysmal or nocturnal dyspnea, sleep apnea, or cough. CARDIAC:  He denies any chest pain, pressure, palpitations, or pedal edema. GASTROINTESTINAL:  The patient has decreased his weight approximately 13 pounds intentionally on the [employer].  He denies any dysphagia, nausea, vomiting, abdominal pain, change in bowel habits.  He does have a hiatal hernia.  GENITOURINARY:  Denies any dysuria.  He does describe some urinary frequency.  Denies any hematuria, hesitancy, incontinence, or urinary tract infections.  NEUROMUSCULAR:  He states he has some dizziness and some presyncope, although he has not passed out, as well as paresthesias in his lower extremities.  He denies any tremors, joint pains, stiffness, swelling, muscle pain, or weakness, or back pain.  He is able to ambulate with cane. VASCULAR AND SKIN:  Please see HPI.  HEMATOLOGIC:  There is no infection, fever, fatigue, night sweats, bruits, or bleeding tendencies, no bleed or blood clots.  PSYCHIATRIC HISTORY:  Denies any depression, anxiety, or memory disturbance.  PHYSICAL EXAMINATION:  VITAL SIGNS:  Blood pressure is 148/80 on the right and 156/70 on the left. Heart rate is 88, respiratory rate is 18.  GENERAL:  This is a 77 year old Caucasian male who is in no acute distress. He is alert  and oriented x3.  HEENT:  Head is normocephalic, atraumatic.  Eyes:  PERRLA/EOMI.  Oral mucosa is pink and moist.  NECK:  Supple without JVD, bruits, lymphadenopathy, or thyromegaly.  RESPIRATORY:  Lung sounds are clear to auscultation bilaterally without any wheezes, rhonchi, or rales.  HEART:  Regular rate and rhythm without any murmurs, gallops, or rubs.  ABDOMEN:  Soft, nontender with positive bowel sounds x4 quadrants without any masses or bruits.  GENITOURINARY/RECTAL:  Deferred.  VASCULAR:  Doppler studies revealed severe left internal carotid artery stenosis.  EXTREMITIES:  He has right lower extremity edema as well as a saphenous vein; however, his incisions which are well-healed.  Of note he does have a well-healed sternotomy incision on his chest.  He has 2+ radial and 2+ pedal pulses bilaterally.  NEUROMUSCULAR:  He is alert and oriented x3.  Cranial nerves II-XII are grossly intact.  He has a steady gait with his cane.  He has full range of motion of his upper and lower extremities and 4+ muscle strength bilaterally.  IMPRESSION:  Left internal carotid artery stenosis, status post TIAs.  PLAN:  The patient is to undergo a left carotid endarterectomy tomorrow on June 24, 2001, by Dr. Madilyn Fireman.  Dr. Madilyn Fireman has seen and evaluated the patient prior to this admission and has explained the risks and benefits of the procedure and the patient has agreed to continue. Dictated by:   Dominica Severin, P.A. Attending Physician:  Melvenia Needles DD:  06/23/01 TD:  06/24/01 Job: [id number]

## 2010-06-08 NOTE — Discharge Summary (Signed)
[place]. [facility]  Patient:    [name], [name]                         MRN: [id number] Adm. Date:  08/17/99 Disc. Date: 08/24/99 Attending:  Madolyn Frieze. Jens Som, M.D. [facility] Dictator:   Gene Serpe, P.A. CC:         Harlow Mares, M.D.   Referring Physician Discharge [place]  PROCEDURES:  Cardiac catheterization July 30 and Metronic Sigma pacemaker implantation August 2.  REASON FOR ADMISSION:  Mr. Gracey is a 77 year old male with complicated past medical history notable for status post CABG, diabetes mellitus, HTN, hypercholesterolemia, RBBB, and history of renal insufficiency who presented with recurrent symptomatic bradycardia.  Patient had initially been seen in January 2001 for evaluation of profound bradycardia in the setting of acute renal insufficiency/hyperkalemia.  ACE inhibitor was discontinued and a renal ultrasound was negative.  Additional studies included a normal 2-D echocardiogram and Persantine Cardiolite.  He was discharged on Norvasc for hypertension.  However, patient continued to have reports of bradycardia and on admission was noted to have marked sinus bradycardia with rates in the 20 BPM range.  He was admitted for further evaluation and management of recurrent profound bradycardia.  LABORATORIES:  Normal HGB/hematocrit and platelet indices.  INR 1.0. Potassium 5.8 on admission with normal renal function-subsequent final potassium level of 4.0.  Final BUN 24, creatinine 1.4.  Cardiac enzymes: Negative CPK-MB x 2, troponin I 0.02, less than 0.03.  TSH 3.57.  Normal LFTs.  ADMISSION CXR:  No acute findings.  Stable cardiomegaly.  HOSPITAL COURSE:  Following admission, patient ruled out for myocardial infarction with negative serial cardiac enzymes.  Hyperkalemia was treated with Kayexalate and hydration with normalization of potassium levels.  Dr. Jens Som was concerned upon presentation that the bradycardia might  be ischemia-mediated.  Thus, plan was to proceed with diagnostic coroangiography.  Cardiac catheterization performed July 30 by Dr. Burna Forts (see catheterization report) revealed no significant ischemic substrate with normal LV function.  Specifically, Dr. Eden Emms noted patency of all the CABG grafts with total occlusion of the native coronary arteries including 70% LMCA.  Dr. Eden Emms recommended proceeding with pacemaker implantation.  Patient was seen in this regard by Dr. Odessa Fleming who recommended proceeding with pacemaker implantation given the recurrence of this marked and symptomatic sinus bradycardia.  He also noted evidence of sinus arrest with slow junctional escape.  Dr. Graciela Husbands was not clear as to the correlation between the noted hyperkalemia on several occasions with the recurrent sinus bradycardia. Following review with colleagues, plan was to proceed with permanent pacemaker implantation.  Dr. Juanda Chance performed successful implantation of Metronic Sigma pacemaker on August 2 set in DDD mode.  No complications were noted.  Postoperative CXR revealed no pneumothorax.  Patient was instructed to refrain from driving x 3 weeks.  DISCHARGE MEDICATIONS: 1. Norvasc 5 mg q.d. 2. Serzone 75 mg q.d. 3. Coated aspirin 81 mg q.d. 4. Glucophage 1000 mg b.i.d. 5. Glucotrol XL 20 mg q.d. 6. Niaspan 750 mg q.d. 7. MVI.  Patient was instructed to refrain from lifting his right upper extremity and to keep the pacemaker wound site clean and dry until follow-up.  Patient is scheduled to follow up with the [facility] on Friday, August 17 at 10 a.m.  Patient will then return for cardiac follow-up with Dr. Juanito Doom in four to six weeks.  DISCHARGE DIAGNOSES: 1. Recurrent symptomatic sinus bradycardia.  a. Status post Metronic (DDD) pacemaker August 2. 2. Coronary artery disease.    a. Cardiac catheterization July 30-widely patent grafts, normal left       ventricular  function. 3. Recurrent hyperkalemia. 4. Hypertension. 5. Diabetes mellitus. 6. History of hypercholesterolemia. 7. History of renal insufficiency on ACE inhibitor. 8. Right bundle branch block. DD:  09/03/99 TD:  09/03/99 Job: [id number]

## 2010-06-08 NOTE — Op Note (Signed)
[place]. [facility]  Patient:    KEMON, DEVINCENZI Visit Number: [id number] MRN: [id number]          Service Type: SUR Location: 2000 2029 01 Attending Physician:  Melvenia Needles Dictated by:   Denman George, M.D. Proc. Date: 06/24/01 Admit Date:  06/24/2001 Discharge Date: 06/26/2001   CC:         Thomas C. Wall, M.D. [facility]  Dr. Linda Hedges, [facility]   Operative Report  SURGEON:  Denman George, M.D.  ASSISTANTS:  Caralee Ates, M.D. and Maxwell Marion, RNFA  ANESTHETIC:  General endotracheal.  ANESTHESIOLOGIST:  Guadalupe Maple, M.D.  PREOPERATIVE DIAGNOSES: 1. Severe left internal carotid artery stenosis. 2. Left hemispheric transient ischemic attacks.  POSTOPERATIVE DIAGNOSES: 1. Severe left internal carotid artery stenosis. 2. Left hemispheric transient ischemic attacks.  PROCEDURE:  Left carotid endarterectomy with Dacron patch angioplasty.  CLINICAL NOTE:  This is a 77 year old male with a history of coronary artery disease and diabetes.  He has recently developed recurrent episodes of dysphasia, light-headedness, dizziness, and mild confusion.  These have been recurrent over the past 4-6 weeks lasting 10-15 minutes.  A work-up for this revealed evidence of severe left internal carotid artery stenosis.  The patient was seen and evaluated in the office.  It was recommended he undergo left carotid endarterectomy for reduction of stroke risk.  Risks and benefits of this operative procedure were explained to the patient in detail. Potential risks of the procedure including MI, CVA, and death approximate 1-2% risk were discussed.  The patient consented for surgery.  DESCRIPTION OF PROCEDURE:  The patient was brought to the operating room in stable condition.  General endotracheal anesthesia was induced.  A Foley catheter and arterial line were in place.  The left neck was prepped and draped in a sterile  fashion.  Curvilinear skin incision was made along the anterior border of the left sternomastoid muscle.  Subcutaneous tissue was divided with electrocautery. Deep dissection carried along the anterior border of the sternomastoid to the carotid sheath. The common carotid artery was mobilized proximally down to the omohyoid muscle and encircled with a vessel loop.  The vagus nerve was reflected posteriorly and preserved.  Distal dissection was carried up to the origin of the superior thyroid and external carotid, which were encircled with a vessel loop.  The internal carotid artery followed distally upto the posterior belly of the digastric muscle.  The hypoglossal nerve was mobilized and retracted superiorly.  The distal internal carotid artery was encircled with a vessel loop.  Examination of the carotid bifurcation verified calcified plaque disease extending well into the origin of the left internal carotid artery.  The patient was administered 7000 units of heparin intravenously.  Adequate circulation time was permitted.  The carotid vessels were controlled with clamps.  A longitudinal arteriotomy was made in the distal left common carotid artery.  The arteriotomy was extended across the carotid bulb and up into the internal carotid artery.  The carotid bifurcation revealed ulcerated plaque with a high-grade, greater than 80% stenosis.  A shunt was inserted.  An endarterectomy elevator was used to remove the plaque.  The endarterectomy was carried down into the common carotid artery, where the plaque was divided transversely with Potts scissors.  The plaque then raised up into the bulb of the superior thyroid and external carotid were endarterectomized using an eversion technique.  The internal carotid plaque then feathered out well distally.  Fragments of  plaque were removed with plaque forceps.  The site irrigated with heparin saline solution.  A Finesse Dacron patch was placed  over the endarterectomy site using running 6-0 Prolene suture.  The shunt was then removed.  All vessels were well-flushed.  Clamps were removed directly initial antegrade flow up the external carotid artery, following this the internal carotid was released. Excellent pulse on Doppler signal were present in the left internal carotid artery.  The patient was administered 50 mg of Protamine intravenously. Adequate hemostasis obtained.  The sternomastoid fascia was closed with running 2-0 Vicryl suture.  Platysma was closed with running 3-0 Vicryl suture.  Skin closed with 4-0 Monocryl. Sterile dressing was applied.  Anesthesia was reversed in the operating room.  The patient awakened readily and moved all extremities to command.  He was transferred to recovery room in stable condition. Dictated by:   Denman George, M.D. Attending Physician:  Melvenia Needles DD:  06/24/01 TD:  06/26/01 Job: [id number]

## 2010-06-08 NOTE — Discharge Summary (Signed)
[place]. [facility]  Patient:    [name], SITES Visit Number: [id number] MRN: [id number]          Service Type: SUR Location: 2000 2029 01 Attending Physician:  Melvenia Needles Dictated by:   Faith Rogue, M.D. Admit Date:  06/24/2001 Discharge Date: 06/26/2001   CC:         Thomas C. Wall, M.D.  Angelia Mould. Charisse March., M.D.   Discharge Summary  DATE OF BIRTH:  1920/07/24  ADMITTING DIAGNOSIS:  Left carotid stenosis.  PAST MEDICAL HISTORY: 1. Coronary artery disease status post coronary artery bypass grafting in    1996 and a permanent pacemaker in 2001. 2. Diabetes mellitus type 2 times 20 years. 3. Hypertension. 4. Dyslipidemia. 5. Peripheral neuropathy.  ALLERGIES:  ACE INHIBITORS and BETA BLOCKERS.  DISCHARGE DIAGNOSIS: Left carotid stenosis status post left carotid endarterectomy.  BRIEF HISTORY:  The patient is a 77 year old Caucasian man who was referred to CVTS by Dr. Daleen Squibb. He was evaluated in the office by Dr. Madilyn Fireman on June 2 after a four to six history of episodic lightheadedness, dizziness, vision changes and speech difficulty.  Doppler evaluation in the CVTS office revealed severe left internal carotid artery stenosis. After examination of the patient and review of the records including the Doppler results, Dr. Madilyn Fireman recommended left carotid endarterectomy for stroke risk reduction.  The procedure risks and benefits were discussed with the patient and he agreed to proceed.  HOSPITAL COURSE: On June 24, 2001 the patient was electively admitted to [facility] under the care of Dr. Denman George.  He underwent an uncomplicated left carotid endarterectomy with a Dacron patch angioplasty.  He tolerated this procedure well and was transferred in stable condition to the PACU.  He emerged from anesthesia neurologically intact.  The patient has remained hemodynamically stable from surgery.  POSTOPERATIVE COURSE:   On postoperative day one he was slow to void after removing the Foley.  He also had some nausea and vomiting on this day.  On postoperative day two he reports feeling very well. He ate breakfast and lunch without nausea and vomiting.  He is urinating without difficulty now. He is ambulating independently and his pain is well controlled. His left neck incision is clean and dry.  He has some mild swelling and is also complaining of a mild sore throat.  The patient is ready for discharge home.  CONDITION ON DISCHARGE:  Improved.  INSTRUCTIONS ON DISCHARGE INCLUDE: 1. Activity - he has been asked to refrain from driving for at least two weeks    and no heavy lifting also. 2. He is to continue his breathing exercises and daily walking.  WOUND CARE:  He may shower tomorrow, that is Saturday, June 7, with mild soap and water.  He has been instructed to look at his incision daily.  If it becomes red, hot, swollen, draining or if he has a fever greater than 101.0 degrees Fahrenheit he has been asked to call Dr. Madilyn Fireman office.  MEDICATIONS ON DISCHARGE: 1. Tylox one to two p.o. q.4. to 6h p.r.n. for pain or acetaminophen 650 mg    p.o. q.4. to 6h p.r.n. for pain. 2. Chloraseptic spray, two sprays to the back of the throat p.r.n. 3. Resume home medications of Glucotrol 10 mg p.o. qd, Actos 30 mg p.o. q.d.,    Serzone 75 mg p.o. q.d., Zocor 20 mg p.o. q.d., Feldene 10 mg p.o. q.d.,    hydrochlorothiazide 12.5  mg p.o. q.d., enteric coated aspirin 81 mg p.o.    q.d. and Centrum multivitamin one p.o. q.d.  He has also been asked to keep track of his blood pressure and blood glucose.  FOLLOW-UP: 1. He has an appointment to see Dr. Madilyn Fireman at the CVTS office on June 30 at    12:30 in the afternoon. 2. He has an appointment to see Dr. Daleen Squibb on June 17 and he has been reminded  to keep that appointment.  Dictated by:   Faith Rogue, M.D. Attending Physician:  Melvenia Needles DD:  06/26/01 TD:   06/29/01 Job: [id number]

## 2010-07-05 ENCOUNTER — Encounter: Payer: Self-pay | Admitting: Internal Medicine

## 2010-07-05 DIAGNOSIS — I495 Sick sinus syndrome: Secondary | ICD-10-CM

## 2010-08-02 ENCOUNTER — Encounter: Payer: Self-pay | Admitting: Internal Medicine

## 2010-08-02 DIAGNOSIS — I495 Sick sinus syndrome: Secondary | ICD-10-CM

## 2010-08-30 ENCOUNTER — Encounter: Payer: Self-pay | Admitting: Internal Medicine

## 2010-08-30 DIAGNOSIS — I495 Sick sinus syndrome: Secondary | ICD-10-CM

## 2010-10-04 ENCOUNTER — Encounter: Payer: Self-pay | Admitting: Internal Medicine

## 2010-10-04 DIAGNOSIS — I495 Sick sinus syndrome: Secondary | ICD-10-CM

## 2010-10-24 ENCOUNTER — Emergency Department (HOSPITAL_COMMUNITY): Payer: Medicare Other

## 2010-10-24 ENCOUNTER — Inpatient Hospital Stay (HOSPITAL_COMMUNITY)
Admission: EM | Admit: 2010-10-24 | Discharge: 2010-10-26 | DRG: 243 | Disposition: A | Discharge status: Home Health | Payer: Medicare Other | Attending: Internal Medicine | Admitting: Internal Medicine

## 2010-10-24 DIAGNOSIS — I498 Other specified cardiac arrhythmias: Secondary | ICD-10-CM | POA: Diagnosis present

## 2010-10-24 DIAGNOSIS — I442 Atrioventricular block, complete: Secondary | ICD-10-CM | POA: Diagnosis present

## 2010-10-24 DIAGNOSIS — E876 Hypokalemia: Secondary | ICD-10-CM | POA: Diagnosis present

## 2010-10-24 DIAGNOSIS — I10 Essential (primary) hypertension: Secondary | ICD-10-CM | POA: Diagnosis present

## 2010-10-24 DIAGNOSIS — T82190A Other mechanical complication of cardiac electrode, initial encounter: Principal | ICD-10-CM | POA: Diagnosis present

## 2010-10-24 DIAGNOSIS — G609 Hereditary and idiopathic neuropathy, unspecified: Secondary | ICD-10-CM | POA: Diagnosis present

## 2010-10-24 DIAGNOSIS — Y849 Medical procedure, unspecified as the cause of abnormal reaction of the patient, or of later complication, without mention of misadventure at the time of the procedure: Secondary | ICD-10-CM | POA: Diagnosis present

## 2010-10-24 DIAGNOSIS — I2510 Atherosclerotic heart disease of native coronary artery without angina pectoris: Secondary | ICD-10-CM | POA: Diagnosis present

## 2010-10-24 DIAGNOSIS — E119 Type 2 diabetes mellitus without complications: Secondary | ICD-10-CM | POA: Diagnosis present

## 2010-10-24 DIAGNOSIS — E785 Hyperlipidemia, unspecified: Secondary | ICD-10-CM | POA: Diagnosis present

## 2010-10-24 LAB — CBC
Hemoglobin: 11.8 g/dL — ABNORMAL LOW (ref 13.0–17.0)
MCH: 29.4 pg (ref 26.0–34.0)
MCHC: 32.7 g/dL (ref 30.0–36.0)
MCV: 90 fL (ref 78.0–100.0)
RBC: 4.01 MIL/uL — ABNORMAL LOW (ref 4.22–5.81)

## 2010-10-24 LAB — POCT I-STAT, CHEM 8
BUN: 45 mg/dL — ABNORMAL HIGH (ref 6–23)
Creatinine, Ser: 1.8 mg/dL — ABNORMAL HIGH (ref 0.50–1.35)
Glucose, Bld: 130 mg/dL — ABNORMAL HIGH (ref 70–99)
Hemoglobin: 11.6 g/dL — ABNORMAL LOW (ref 13.0–17.0)
Potassium: 5.8 mEq/L — ABNORMAL HIGH (ref 3.5–5.1)
Sodium: 140 mEq/L (ref 135–145)
TCO2: 24 mmol/L (ref 0–100)

## 2010-10-24 LAB — COMPREHENSIVE METABOLIC PANEL
ALT: 19 U/L (ref 0–53)
AST: 16 U/L (ref 0–37)
Albumin: 3.3 g/dL — ABNORMAL LOW (ref 3.5–5.2)
Alkaline Phosphatase: 60 U/L (ref 39–117)
BUN: 44 mg/dL — ABNORMAL HIGH (ref 6–23)
Chloride: 106 mEq/L (ref 96–112)
Potassium: 5.9 mEq/L — ABNORMAL HIGH (ref 3.5–5.1)
Sodium: 141 mEq/L (ref 135–145)
Total Bilirubin: 0.2 mg/dL — ABNORMAL LOW (ref 0.3–1.2)

## 2010-10-24 LAB — PROTIME-INR
INR: 0.96 (ref 0.00–1.49)
Prothrombin Time: 13 seconds (ref 11.6–15.2)

## 2010-10-25 DIAGNOSIS — I498 Other specified cardiac arrhythmias: Secondary | ICD-10-CM

## 2010-10-25 LAB — BASIC METABOLIC PANEL
CO2: 25 mEq/L (ref 19–32)
Calcium: 8.5 mg/dL (ref 8.4–10.5)
Chloride: 104 mEq/L (ref 96–112)
Creatinine, Ser: 1.62 mg/dL — ABNORMAL HIGH (ref 0.50–1.35)
Glucose, Bld: 98 mg/dL (ref 70–99)

## 2010-10-25 LAB — GLUCOSE, CAPILLARY
Glucose-Capillary: 145 mg/dL — ABNORMAL HIGH (ref 70–99)
Glucose-Capillary: 173 mg/dL — ABNORMAL HIGH (ref 70–99)

## 2010-10-25 LAB — CBC
HCT: 37.9 % — ABNORMAL LOW (ref 39.0–52.0)
Hemoglobin: 12.3 g/dL — ABNORMAL LOW (ref 13.0–17.0)
MCH: 28.6 pg (ref 26.0–34.0)
MCV: 88.1 fL (ref 78.0–100.0)
RBC: 4.3 MIL/uL (ref 4.22–5.81)
WBC: 8.1 10*3/uL (ref 4.0–10.5)

## 2010-10-26 ENCOUNTER — Inpatient Hospital Stay (HOSPITAL_COMMUNITY): Payer: Medicare Other

## 2010-10-26 LAB — GLUCOSE, CAPILLARY: Glucose-Capillary: 116 mg/dL — ABNORMAL HIGH (ref 70–99)

## 2010-11-01 DIAGNOSIS — I495 Sick sinus syndrome: Secondary | ICD-10-CM

## 2010-11-01 NOTE — Op Note (Signed)
[name], [name]                ACCOUNT NO.:  [id number]  MEDICAL RECORD NO.:  [id number]  LOCATION:  2004                         FACILITY:  [facility]  PHYSICIAN:  Doylene Canning. [name], MD    DATE OF BIRTH:  [birth year]  DATE OF PROCEDURE:  10/25/2010 DATE OF DISCHARGE:                              OPERATIVE REPORT   PROCEDURE PERFORMED:  Insertion of dual-chamber pacemaker.  INDICATIONS:  Status post prior pacemaker insertion with broken RV lead, unable to revise the broken RV lead secondary to occlusion of the right subclavian vein, now for insertion of a new dual-chamber device.  INTRODUCTION:  The patient is an 77 year old male with longstanding bradycardia, complete heart block status post pacemaker insertion.  He was subsequently found to have a broken RV lead with markedly elevated bipolar pacing impedance of 2000 ohms.  He is now referred for insertion of a new device.  PROCEDURE:  After informed consent was obtained, the patient was taken to the diagnostic catheterization lab in the fasting state.  After usual preparation and draping, intravenous fentanyl and midazolam were given for sedation.  A 30 mL of lidocaine was initially infiltrated into the right infraclavicular region.  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  This was confirmed with a second venogram from the right side.  At this point, the left side was evaluated and left upper extremity venous system was in fact intact.  The initial procedure from the right side was abandoned and the patient was re-prepped and draped from the left side in a sterile fashion.  A 30 mL lidocaine was infiltrated into the left infraclavicular region.  A 5-cm incision was carried out over this region.  Electrocautery was utilized to dissect down to the fascial plane.  The left subclavian vein was punctured x2  and a Medtronic model 5076 58-cm active fixation pacing lead, serial #[id number] was advanced into the right ventricle and the Medtronic model 5076 52-cm active fixation pacing lead, serial #[id number] was advanced in the right atrium.  Mapping was carried out in the right ventricle and paced R- waves at 10 mV.  The pacing impedance was 900 ohms and threshold 0.9 volts at 0.5 milliseconds.  A 10-volt pacing did not stimulate the diaphragm.  With the ventricular lead in satisfactory position, attention was then turned to the placement of an atrial lead, was placed in anterolateral portion of the right atrium where P-waves measured 1.5 mV.  The pacing threshold was 1.4 volts at 0.5 milliseconds and the impedance was 500 ohms.  With both the atrial and ventricular leads in satisfactory position, there was secured to subpectoralis fascia with a figure-of-eight silk suture.  The sewing sleeve was secured with silk suture.  Electrocautery was utilized to make the subcutaneous pocket. Antibiotic irrigation was utilized to irrigate the pocket.  The Medtronic Sensia dual-chamber pacemaker serial [id number] was connected to the atrial and ventricular leads and placed back in the subcutaneous pocket where it were secured with silk suture.  Pocket was irrigated with antibiotic irrigation.  The incision was closed with  2-0 and 3-0 Vicryl.  Benzoin and Steri-Strips were painted on the skin, pressure dressing was applied, the patient was returned to his room in satisfactory condition.  COMPLICATIONS:  There were no immediate procedure complications.  RESULTS:  Demonstrate successful implantation of a Medtronic dual- chamber pacemaker in a patient with symptomatic complete heart block, broken RV pacing lead, and congestive heart failure.  Of note, the patient's old device was reprogrammed to the ODO mode.     Doylene Canning. [name], MD     GWT/MEDQ  D:  10/25/2010  T:  10/26/2010  Job:   [id number]  Electronically Signed by Lewayne Bunting MD on 11/01/2010 06:50:47 PM

## 2010-11-01 NOTE — Discharge Summary (Signed)
[name], [name]                ACCOUNT NO.:  [id number]  MEDICAL RECORD NO.:  [id number]  LOCATION:  2004                         FACILITY:  [facility]  PHYSICIAN:  Doylene Canning. [name], MD    DATE OF BIRTH:  03-01-1920  DATE OF ADMISSION:  10/24/2010 DATE OF DISCHARGE:  10/26/2010                              DISCHARGE SUMMARY   PRIMARY CARE PHYSICIAN:  Lindwood Qua, MD  CARDIOLOGIST:  New Dr. Hillis Range, with previously Dr. Charlies Constable.  PRIMARY DIAGNOSIS:  Right ventricular lead fracture resulting in loss of pacing status post new pacemaker implantation this admission.  SECONDARY DIAGNOSES: 1. Symptomatic bradycardia status post pacemaker implant in August     2001. 2. Coronary artery disease status post coronary artery bypass graft in     1996 by Dr. Tyrone Sage. 3. Hypertension. 4. Hyperlipidemia. 5. Type 2 diabetes. 6. Peripheral neuropathy. 7. Carotid disease status post left carotid endarterectomy in 2003.  ALLERGIES:  The patient is allergic to LISINOPRIL.  PROCEDURES THIS ADMISSION: 1. 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  The patient received a     Medtronic model number [id number] pacemaker with model number 5076     right atrial and right ventricular leads.  The patient had no early     apparent complications. 2. Chest x-ray on October 26, 2010 demonstrated no pneumothorax status     post pacemaker implantation.  ADMISSION LABORATORY DATA:  Potassium 4.7, BUN 38, creatinine 1.62, and glucose 98.  White count 8.1, hemoglobin 12.3, and hematocrit 37.9.  INR 0.96.  BRIEF HISTORY OF PRESENT ILLNESS:  [name] is an 77 year old male with a history of coronary disease and symptomatic bradycardia status post pacemaker implantation originally in 2001.  On the day of admission, he began to have significant fatigue and weakness.  He tested the pulse and it was in the 30s.  He called his daughter who  called 911 and he was transferred to [facility] for further evaluation.  On arrival, he was found to have a bipolar RV lead fracture.  His device was therefore reprogrammed to unipolar recapture.  The patient was admitted for further treatment.  HOSPITAL COURSE:  The patient was admitted on October 24, 2010 for planned RV lead revision and pacemaker generator change.  Because of the patient's occluded right subclavian vein, he underwent placement of a new pacemaker system on the left side.  The patient tolerated this procedure well with no real apparent complications.  He was monitored on telemetry overnight which demonstrated AV pacing.  His left chest was without hematoma or ecchymosis.  Chest x-ray was obtained which demonstrated no pneumothorax.  His device was interrogated and found to be functioning normally.  Dr. [name] examined the patient on October 26, 2010 and considered him stable for discharge.  DISCHARGE INSTRUCTIONS: 1. Increase activity slowly. 2. No driving for 3 days. 3. Follow a low-sodium, heart-healthy diet. 4. Keep the incisions clean and dry for 1 week.  FOLLOWUP APPOINTMENTS: 1. Dr. Johney Frame on November 07, 2010 at 4:15 p.m. - this was previously     scheduled. 2. Dr.  Hofmann as scheduled.  DISCHARGE MEDICATIONS: 1. Aspirin 325 mg daily. 2. Glipizide 5 mg twice daily. 3. Metoprolol tartrate 25 mg twice daily. 4. Simvastatin 40 mg daily. 5. Flomax 0.4 mg every evening.  DISPOSITION:  The patient was seen and examined by Dr. [name] on October 26, 2010 and considered stable for discharge.  DURATION OF DISCHARGE ENCOUNTER:  35 minutes.     Gypsy Balsam, RN,BSN   ______________________________ Doylene Canning. [name], MD    AS/MEDQ  D:  10/26/2010  T:  10/26/2010  Job:  [id number]  cc:   Wannetta Sender., MD  Electronically Signed by Gypsy Balsam RNBSN on 10/30/2010 02:38:46 PM Electronically Signed by Lewayne Bunting MD on 11/01/2010 06:50:52 PM

## 2010-11-05 ENCOUNTER — Telehealth: Payer: Self-pay | Admitting: Internal Medicine

## 2010-11-05 NOTE — Telephone Encounter (Signed)
Nursing is going out tomorrow  He is somewhat depressed  Due to his wife being in a facility and he wants to be placed in the same one  The [facility] is going to see about getting a Child psychotherapist involved with case to see if maybe can get them placed together  They have contacted his PCP regarding the depression

## 2010-11-05 NOTE — Telephone Encounter (Signed)
Ask for [name].  Message for patient Blood Pressure 100/50 and is having 3 out of 10 pain around site. Pt feels somewhat depressed.  She will call PCP for that.  This is info only but if any orders call [name].

## 2010-11-06 ENCOUNTER — Telehealth: Payer: Self-pay | Admitting: Internal Medicine

## 2010-11-06 NOTE — Telephone Encounter (Signed)
Pt has an appointment tomorrow with Dr [name] and we can address these issues at the appointment

## 2010-11-06 NOTE — Telephone Encounter (Signed)
Needs to report BP 95/50 everything else is ok.  Pt fells weak/tired/  Please call HM back and advise if any changes.

## 2010-11-07 ENCOUNTER — Encounter: Payer: Self-pay | Admitting: Internal Medicine

## 2010-11-07 ENCOUNTER — Ambulatory Visit (INDEPENDENT_AMBULATORY_CARE_PROVIDER_SITE_OTHER): Payer: Medicare Other | Admitting: Internal Medicine

## 2010-11-07 DIAGNOSIS — Z950 Presence of cardiac pacemaker: Secondary | ICD-10-CM

## 2010-11-07 DIAGNOSIS — I495 Sick sinus syndrome: Secondary | ICD-10-CM

## 2010-11-07 LAB — PACEMAKER DEVICE OBSERVATION
AL IMPEDENCE PM: 464 Ohm
AL THRESHOLD: 0.75 V
ATRIAL PACING PM: 79
BATTERY VOLTAGE: 2.79 V
RV LEAD IMPEDENCE PM: 540 Ohm

## 2010-11-07 NOTE — Progress Notes (Signed)
The patient presents today for routine electrophysiology followup.  Since last being seen by Dr Juanda Chance, the patient reports doing reasonably well.  He recently underwent urgent pacemaker system revision by Dr [name] due to RV lead fracture.  His old R sided system was abandoned due to occluded R subclavian vein and an inability to place a new lead.  He now has a new left sided pacing system.  He has mild site soreness but is otherwise doing very well.  Today, he denies symptoms of palpitations, chest pain, shortness of breath, orthopnea, PND, lower extremity edema, dizziness, presyncope, syncope, or neurologic sequela.  The patient feels that he is tolerating medications without difficulties and is otherwise without complaint today.   Past Medical History  Diagnosis Date  . Hypertension   . Hyperlipidemia   . Diabetes mellitus   . Coronary artery disease     s/p CABG 1996  . Sick sinus syndrome     s/p PPM 2001 (R sided),  RV lead fracture 10/12 with new system (MDT) placed on L side due to R sided venous occlusion  . Diabetic neuropathy   . CVD (cardiovascular disease)     s/p CEA   Past Surgical History  Procedure Date  . Coronary artery bypass graft   . Carotid endarterectomy   . Pacemaker insertion     s/p PPM 2001 (R sided),  RV lead fracture 10/12 with new system (MDT) placed on L side due to R sided venous occlusion    Current Outpatient Prescriptions  Medication Sig Dispense Refill  . aspirin 325 MG tablet Take 325 mg by mouth daily.        Marland Kitchen glipiZIDE (GLUCOTROL) 5 MG tablet Take 5 mg by mouth 2 (two) times daily before a meal.        . metoprolol tartrate (LOPRESSOR) 25 MG tablet Take 25 mg by mouth 2 (two) times daily.        . simvastatin (ZOCOR) 40 MG tablet Take 40 mg by mouth at bedtime.        . Tamsulosin HCl (FLOMAX) 0.4 MG CAPS Take 0.4 mg by mouth daily.          Allergies  Allergen Reactions  . Ace Inhibitors     History   Social History  . Marital Status:  Married    Spouse Name: N/A    Number of Children: N/A  . Years of Education: N/A   Occupational History  . Not on file.   Social History Main Topics  . Smoking status: Former Smoker    Quit date: 11/07/1975  . Smokeless tobacco: Never Used  . Alcohol Use: No  . Drug Use: No  . Sexually Active: Not on file   Other Topics Concern  . Not on file   Social History Narrative  . No narrative on file    Physical Exam: Filed Vitals:   11/07/10 1623  BP: 138/68  Pulse: 75  Height: 5' 10.5" (1.791 m)  Weight: 202 lb (91.627 kg)    GEN- The patient is well appearing, alert and oriented x 3 today.   Head- normocephalic, atraumatic Eyes-  Sclera clear, conjunctiva pink Ears- hearing intact Oropharynx- clear Neck- supple, no JVP Lymph- no cervical lymphadenopathy Lungs- Clear to ausculation bilaterally, normal work of breathing Chest- pacemaker pocket is well healed Heart- Regular rate and rhythm, no murmurs, rubs or gallops, PMI not laterally displaced GI- soft, NT, ND, + BS Extremities- no clubbing, cyanosis, or edema  Pacemaker  interrogation- reviewed in detail today,  See PACEART report  Assessment and Plan:

## 2010-11-07 NOTE — Assessment & Plan Note (Signed)
Normal pacemaker function See Arita Miss Art report No changes today  steristrips removed today Return in 3 months

## 2010-11-07 NOTE — Patient Instructions (Signed)
Your physician recommends that you schedule a follow-up appointment in 3 months with Dr Allred    

## 2010-11-29 ENCOUNTER — Telehealth: Payer: Self-pay | Admitting: Internal Medicine

## 2010-11-29 ENCOUNTER — Ambulatory Visit (INDEPENDENT_AMBULATORY_CARE_PROVIDER_SITE_OTHER): Payer: Medicare Other | Admitting: *Deleted

## 2010-11-29 DIAGNOSIS — I495 Sick sinus syndrome: Secondary | ICD-10-CM

## 2010-11-29 LAB — PACEMAKER DEVICE OBSERVATION

## 2010-11-29 MED ORDER — SULFAMETHOXAZOLE-TRIMETHOPRIM 800-160 MG PO TABS: 1.0000 | ORAL_TABLET | Freq: Two times a day (BID) | ORAL | Status: DC

## 2010-11-29 NOTE — Telephone Encounter (Signed)
Pt son calling stating that site where pacemaker was inserted site is no oozing and has been oozing since yesterday. Please return pt call to discuss further.

## 2010-11-29 NOTE — Progress Notes (Signed)
Wound check--site oozing

## 2010-11-29 NOTE — Telephone Encounter (Signed)
Pt's son is calling back again

## 2010-11-30 NOTE — Telephone Encounter (Signed)
Patient coming in to see [facility] and Dr. Johney Frame if needed.

## 2010-12-05 ENCOUNTER — Ambulatory Visit (INDEPENDENT_AMBULATORY_CARE_PROVIDER_SITE_OTHER): Payer: Medicare Other | Admitting: *Deleted

## 2010-12-05 DIAGNOSIS — I495 Sick sinus syndrome: Secondary | ICD-10-CM

## 2010-12-05 DIAGNOSIS — R0989 Other specified symptoms and signs involving the circulatory and respiratory systems: Secondary | ICD-10-CM

## 2010-12-05 LAB — PACEMAKER DEVICE OBSERVATION

## 2010-12-17 ENCOUNTER — Other Ambulatory Visit: Payer: Self-pay

## 2010-12-17 ENCOUNTER — Ambulatory Visit (INDEPENDENT_AMBULATORY_CARE_PROVIDER_SITE_OTHER): Payer: Medicare Other | Admitting: *Deleted

## 2010-12-17 ENCOUNTER — Encounter: Payer: Self-pay | Admitting: Internal Medicine

## 2010-12-17 DIAGNOSIS — I495 Sick sinus syndrome: Secondary | ICD-10-CM

## 2010-12-17 LAB — PACEMAKER DEVICE OBSERVATION
AL IMPEDENCE PM: 456 Ohm
ATRIAL PACING PM: 89
BATTERY VOLTAGE: 2.8 V
RV LEAD THRESHOLD: 1 V
VENTRICULAR PACING PM: 100

## 2010-12-17 NOTE — Progress Notes (Signed)
PPM wound check 

## 2010-12-18 ENCOUNTER — Telehealth: Payer: Self-pay | Admitting: Internal Medicine

## 2010-12-18 NOTE — Telephone Encounter (Signed)
Called and spoke with Larene Beach and she is going to refax today to [phone]

## 2010-12-18 NOTE — Telephone Encounter (Signed)
New Msg: Vickie from [facility] regarding face to face and orders needing to signed by MD. Pt d/c instructions were signed but face to face and orders were never signed and this prohibits the services to be billed. Please return Vickie call to discuss further.

## 2011-02-21 ENCOUNTER — Encounter: Payer: Self-pay | Admitting: Internal Medicine

## 2011-02-21 ENCOUNTER — Ambulatory Visit (INDEPENDENT_AMBULATORY_CARE_PROVIDER_SITE_OTHER): Payer: Medicare Other | Admitting: Internal Medicine

## 2011-02-21 DIAGNOSIS — I495 Sick sinus syndrome: Secondary | ICD-10-CM

## 2011-02-21 DIAGNOSIS — I10 Essential (primary) hypertension: Secondary | ICD-10-CM

## 2011-02-21 LAB — PACEMAKER DEVICE OBSERVATION
AL AMPLITUDE: 4 mv
AL IMPEDENCE PM: 432 Ohm
AL THRESHOLD: 0.625 v
ATRIAL PACING PM: 92
BAMS-0001: 150 {beats}/min
BATTERY VOLTAGE: 2.79 v
RV LEAD IMPEDENCE PM: 501 Ohm
RV LEAD THRESHOLD: 0.625 v
VENTRICULAR PACING PM: 100

## 2011-02-21 NOTE — Patient Instructions (Signed)
Your physician wants you to follow-up in: OCT OF 2013 You will receive a reminder letter in the mail two months in advance. If you don't receive a letter, please call our office to schedule the follow-up appointment.

## 2011-02-21 NOTE — Assessment & Plan Note (Signed)
Stable No change required today  

## 2011-02-21 NOTE — Assessment & Plan Note (Signed)
Normal pacemaker function See Pace Art report No changes today  

## 2011-02-21 NOTE — Progress Notes (Signed)
The patient presents today for routine electrophysiology followup.  Since last being seen by Dr Juanda Chance, the patient reports doing reasonably well.  He underwent urgent pacemaker system revision 10/12 by Dr [name] due to RV lead fracture.  His old R sided system was abandoned due to occluded R subclavian vein and an inability to place a new lead.  He now has a new left sided pacing system.  His incision is now well healed.  Today, he denies symptoms of palpitations, chest pain, shortness of breath, orthopnea, PND, lower extremity edema, dizziness, presyncope, syncope, or neurologic sequela.  The patient feels that he is tolerating medications without difficulties and is otherwise without complaint today.   Past Medical History  Diagnosis Date  . Hypertension   . Hyperlipidemia   . Diabetes mellitus   . Coronary artery disease     s/p CABG 1996  . Sick sinus syndrome     s/p PPM 2001 (R sided),  RV lead fracture 10/12 with new system (MDT) placed on L side due to R sided venous occlusion  . Diabetic neuropathy   . CVD (cardiovascular disease)     s/p CEA   Past Surgical History  Procedure Date  . Coronary artery bypass graft   . Carotid endarterectomy   . Pacemaker insertion     s/p PPM 2001 (R sided),  RV lead fracture 10/12 with new system (MDT) placed on Left side due to R sided venous occlusion    Current Outpatient Prescriptions  Medication Sig Dispense Refill  . aspirin 325 MG tablet Take 325 mg by mouth daily.        Marland Kitchen glipiZIDE (GLUCOTROL) 5 MG tablet Take 5 mg by mouth 2 (two) times daily before a meal.        . metoprolol tartrate (LOPRESSOR) 25 MG tablet Take 25 mg by mouth 2 (two) times daily.        . simvastatin (ZOCOR) 40 MG tablet Take 40 mg by mouth at bedtime.        . Tamsulosin HCl (FLOMAX) 0.4 MG CAPS Take 0.4 mg by mouth daily.        Marland Kitchen sulfamethoxazole-trimethoprim (BACTRIM DS) 800-160 MG per tablet Take 1 tablet by mouth 2 (two) times daily.  28 tablet  0     Allergies  Allergen Reactions  . Ace Inhibitors     History   Social History  . Marital Status: Married    Spouse Name: N/A    Number of Children: N/A  . Years of Education: N/A   Occupational History  . Not on file.   Social History Main Topics  . Smoking status: Former Smoker    Quit date: 11/07/1975  . Smokeless tobacco: Never Used  . Alcohol Use: No  . Drug Use: No  . Sexually Active: Not on file   Other Topics Concern  . Not on file   Social History Narrative  . No narrative on file    Physical Exam: Filed Vitals:   02/21/11 1207  BP: 132/68  Pulse: 78  Height: 5\' 10"  (1.778 m)  Weight: 202 lb (91.627 kg)    GEN- The patient is well appearing, alert and oriented x 3 today.   Head- normocephalic, atraumatic Eyes-  Sclera clear, conjunctiva pink Ears- hearing intact Oropharynx- clear Neck- supple, no JVP Lymph- no cervical lymphadenopathy Lungs- Clear to ausculation bilaterally, normal work of breathing Chest- pacemaker pocket is well healed Heart- Regular rate and rhythm, no murmurs, rubs or gallops,  PMI not laterally displaced GI- soft, NT, ND, + BS Extremities- no clubbing, cyanosis, or edema  Pacemaker interrogation- reviewed in detail today,  See PACEART report  Assessment and Plan:

## 2011-11-20 ENCOUNTER — Encounter: Payer: Self-pay | Admitting: *Deleted

## 2011-11-20 DIAGNOSIS — Z950 Presence of cardiac pacemaker: Secondary | ICD-10-CM | POA: Insufficient documentation

## 2011-11-28 ENCOUNTER — Encounter: Payer: Self-pay | Admitting: Internal Medicine

## 2011-11-28 ENCOUNTER — Ambulatory Visit (INDEPENDENT_AMBULATORY_CARE_PROVIDER_SITE_OTHER): Payer: Medicare Other | Admitting: Internal Medicine

## 2011-11-28 VITALS — BP 114/60 | HR 75 | Ht 61.2 in | Wt 208.0 lb

## 2011-11-28 DIAGNOSIS — I10 Essential (primary) hypertension: Secondary | ICD-10-CM

## 2011-11-28 DIAGNOSIS — I495 Sick sinus syndrome: Secondary | ICD-10-CM

## 2011-11-28 NOTE — Patient Instructions (Signed)
Your physician wants you to follow-up in: 6 months with Sunday Spillers and 12 months with Dr Jacquiline Doe will receive a reminder letter in the mail two months in advance. If you don't receive a letter, please call our office to schedule the follow-up appointment.   Remote monitoring is used to monitor your Pacemaker of ICD from home. This monitoring reduces the number of office visits required to check your device to one time per year. It allows [place] to keep an eye on the functioning of your device to ensure it is working properly. You are scheduled for a device check from home on 03/02/12. You may send your transmission at any time that day. If you have a wireless device, the transmission will be sent automatically. After your physician reviews your transmission, you will receive a postcard with your next transmission date.

## 2011-11-29 LAB — PACEMAKER DEVICE OBSERVATION
AL IMPEDENCE PM: 438 Ohm
AL THRESHOLD: 1 V
BAMS-0001: 150 {beats}/min
RV LEAD THRESHOLD: 1 V

## 2011-12-01 NOTE — Progress Notes (Signed)
PCP: Lindwood Qua, MD  [name] is a 78 y.o. male who presents today for routine electrophysiology followup.  Since last being seen in our clinic, the patient reports doing well for his age.  Today, he denies symptoms of palpitations, chest pain, shortness of breath, dizziness, presyncope, or syncope.   He has stable edema. The patient is otherwise without complaint today.   Past Medical History  Diagnosis Date  . Hypertension   . Hyperlipidemia   . Diabetes mellitus   . Coronary artery disease     s/p CABG 1996  . Sick sinus syndrome     s/p PPM 2001 (R sided),  RV lead fracture 10/12 with new system (MDT) placed on L side due to R sided venous occlusion  . Diabetic neuropathy   . CVD (cardiovascular disease)     s/p CEA   Past Surgical History  Procedure Date  . Coronary artery bypass graft   . Carotid endarterectomy   . Pacemaker insertion     s/p PPM 2001 (R sided),  RV lead fracture 10/12 with new system (MDT) placed on Left side due to R sided venous occlusion    Current Outpatient Prescriptions  Medication Sig Dispense Refill  . aspirin 325 MG tablet Take 325 mg by mouth daily.        Marland Kitchen glipiZIDE (GLUCOTROL) 5 MG tablet Take 5 mg by mouth 2 (two) times daily before a meal.        . metoprolol tartrate (LOPRESSOR) 25 MG tablet Take 25 mg by mouth 2 (two) times daily.        . simvastatin (ZOCOR) 40 MG tablet Take 40 mg by mouth at bedtime.        . Tamsulosin HCl (FLOMAX) 0.4 MG CAPS Take 0.4 mg by mouth daily.          Physical Exam: Filed Vitals:   11/28/11 1622  BP: 114/60  Pulse: 75  Height: 5' 1.2" (1.554 m)  Weight: 208 lb (94.348 kg)  SpO2: 98%    GEN- The patient is elderly appearing, alert and oriented x 3 today.   Head- normocephalic, atraumatic Eyes-  Sclera clear, conjunctiva pink Ears- hearing intact Oropharynx- clear Lungs- Clear to ausculation bilaterally, normal work of breathing Chest- L pacemaker pocket is well healed, his previously  implanted pacemaker has been abandoned on the R side Heart- Regular rate and rhythm, no murmurs, rubs or gallops, PMI not laterally displaced GI- soft, NT, ND, + BS Extremities- no clubbing, cyanosis, +1 edema  Pacemaker interrogation- reviewed in detail today,  See PACEART report  Assessment and Plan:

## 2011-12-01 NOTE — Assessment & Plan Note (Signed)
Normal pacemaker function See Pace Art report No changes today  

## 2011-12-01 NOTE — Assessment & Plan Note (Signed)
Stable No change required today  

## 2012-03-02 ENCOUNTER — Encounter: Payer: Medicare Other | Admitting: *Deleted

## 2012-03-09 ENCOUNTER — Encounter: Payer: Self-pay | Admitting: *Deleted

## 2012-03-16 ENCOUNTER — Ambulatory Visit (INDEPENDENT_AMBULATORY_CARE_PROVIDER_SITE_OTHER): Payer: Medicare Other | Admitting: *Deleted

## 2012-03-16 ENCOUNTER — Telehealth: Payer: Self-pay | Admitting: Internal Medicine

## 2012-03-16 ENCOUNTER — Encounter: Payer: Self-pay | Admitting: Internal Medicine

## 2012-03-16 ENCOUNTER — Other Ambulatory Visit: Payer: Self-pay | Admitting: Internal Medicine

## 2012-03-16 DIAGNOSIS — I495 Sick sinus syndrome: Secondary | ICD-10-CM

## 2012-03-16 DIAGNOSIS — Z950 Presence of cardiac pacemaker: Secondary | ICD-10-CM

## 2012-03-16 NOTE — Telephone Encounter (Signed)
Pt calling to see if remote transmission came in

## 2012-03-16 NOTE — Telephone Encounter (Signed)
Transmission was received. Spoke w/pt and pt aware.

## 2012-03-24 LAB — REMOTE PACEMAKER DEVICE
AL IMPEDENCE PM: 426 Ohm
AL IMPEDENCE PM: 426 Ohm
AL THRESHOLD: 0.625 v
ATRIAL PACING PM: 94
ATRIAL PACING PM: 94
BAMS-0001: 150 {beats}/min
BATTERY VOLTAGE: 2.79 V
BATTERY VOLTAGE: 2.79 v
RV LEAD IMPEDENCE PM: 529 Ohm
RV LEAD IMPEDENCE PM: 529 Ohm
RV LEAD THRESHOLD: 0.5 V
RV LEAD THRESHOLD: 0.5 v
VENTRICULAR PACING PM: 100

## 2012-06-11 ENCOUNTER — Telehealth: Payer: Self-pay | Admitting: Internal Medicine

## 2012-06-11 NOTE — Telephone Encounter (Signed)
Patient instructed to send carelink transmission 06/16/12.  Patient also requested an appointment with Norma Fredrickson.  Appointment scheduled for 6*3/14 @ 3:00pm

## 2012-06-11 NOTE — Telephone Encounter (Signed)
New Prob      Pt has some questions regarding follow up remote check. Please call.

## 2012-06-16 ENCOUNTER — Ambulatory Visit (INDEPENDENT_AMBULATORY_CARE_PROVIDER_SITE_OTHER): Payer: Medicare Other | Admitting: *Deleted

## 2012-06-16 DIAGNOSIS — I495 Sick sinus syndrome: Secondary | ICD-10-CM

## 2012-06-16 DIAGNOSIS — Z950 Presence of cardiac pacemaker: Secondary | ICD-10-CM

## 2012-06-19 ENCOUNTER — Encounter: Payer: Self-pay | Admitting: Cardiology

## 2012-06-22 LAB — REMOTE PACEMAKER DEVICE
AL IMPEDENCE PM: 404 Ohm
AL THRESHOLD: 0.625 V
BAMS-0001: 150 {beats}/min
BATTERY VOLTAGE: 2.79 V
RV LEAD THRESHOLD: 0.5 V

## 2012-06-23 ENCOUNTER — Ambulatory Visit (INDEPENDENT_AMBULATORY_CARE_PROVIDER_SITE_OTHER)
Admission: RE | Admit: 2012-06-23 | Discharge: 2012-06-23 | Disposition: A | Discharge status: Home / Self Care | Payer: Medicare Other | Source: Ambulatory Visit | Attending: Nurse Practitioner | Admitting: Nurse Practitioner

## 2012-06-23 ENCOUNTER — Encounter: Payer: Self-pay | Admitting: Nurse Practitioner

## 2012-06-23 ENCOUNTER — Ambulatory Visit (INDEPENDENT_AMBULATORY_CARE_PROVIDER_SITE_OTHER): Payer: Medicare Other | Admitting: Nurse Practitioner

## 2012-06-23 VITALS — BP 162/84 | HR 68 | Ht 70.5 in | Wt 190.0 lb

## 2012-06-23 DIAGNOSIS — R5383 Other fatigue: Secondary | ICD-10-CM

## 2012-06-23 DIAGNOSIS — G459 Transient cerebral ischemic attack, unspecified: Secondary | ICD-10-CM

## 2012-06-23 DIAGNOSIS — R5381 Other malaise: Secondary | ICD-10-CM

## 2012-06-23 LAB — CBC WITH DIFFERENTIAL/PLATELET
Basophils Absolute: 0.1 10*3/uL (ref 0.0–0.1)
Basophils Relative: 0.6 % (ref 0.0–3.0)
Eosinophils Absolute: 0.2 10*3/uL (ref 0.0–0.7)
Eosinophils Relative: 2.5 % (ref 0.0–5.0)
HCT: 38.1 % — ABNORMAL LOW (ref 39.0–52.0)
Hemoglobin: 12.4 g/dL — ABNORMAL LOW (ref 13.0–17.0)
Lymphocytes Relative: 18.4 % (ref 12.0–46.0)
Lymphs Abs: 1.5 10*3/uL (ref 0.7–4.0)
MCHC: 32.6 g/dL (ref 30.0–36.0)
MCV: 91.6 fl (ref 78.0–100.0)
Monocytes Absolute: 0.9 10*3/uL (ref 0.1–1.0)
Monocytes Relative: 11 % (ref 3.0–12.0)
Neutro Abs: 5.6 10*3/uL (ref 1.4–7.7)
Neutrophils Relative %: 67.5 % (ref 43.0–77.0)
Platelets: 241 10*3/uL (ref 150.0–400.0)
RBC: 4.16 Mil/uL — ABNORMAL LOW (ref 4.22–5.81)
RDW: 13.6 % (ref 11.5–14.6)
WBC: 8.3 10*3/uL (ref 4.5–10.5)

## 2012-06-23 LAB — BASIC METABOLIC PANEL
BUN: 35 mg/dL — ABNORMAL HIGH (ref 6–23)
CO2: 23 mEq/L (ref 19–32)
Calcium: 9 mg/dL (ref 8.4–10.5)
Chloride: 110 mEq/L (ref 96–112)
Creatinine, Ser: 1.5 mg/dL (ref 0.4–1.5)
GFR: 45.39 mL/min — ABNORMAL LOW (ref >60.00)
Glucose, Bld: 109 mg/dL — ABNORMAL HIGH (ref 70–99)
Potassium: 5.5 mEq/L — ABNORMAL HIGH (ref 3.5–5.1)
Sodium: 143 mEq/L (ref 135–145)

## 2012-06-23 LAB — TSH: TSH: 1.64 u[IU]/mL (ref 0.35–5.50)

## 2012-06-23 NOTE — Patient Instructions (Signed)
We will check a CT scan of your head  We are checking labs today  We will get your pacemaker reprogrammed  Go to the ER if you have any more spells where your speech is slurred and you can't walk  Call the [facility] office at [phone] if you have any questions, problems or concerns.

## 2012-06-23 NOTE — Progress Notes (Signed)
[name] Date of Birth: May 11, 1920 Medical Record #[id number]  History of Present Illness: [name] is seen back today for a 6 month check. He is seen for Dr. Johney Frame. He has a pacemaker in place - changed out in 2012, originally placed in 2001. Remote CABG in 1996. Other issues include HTN, HLD and DM. He has had remote stroke and has had prior CEA on the left.   Comes in today. Here with his son. He says he is not doing well. He "can't do anything". Using a walker, power chair, wheel chair to get around. He wants this pacemaker programmed like his last one. I am assuming he is referring to the rate responsiveness. Has trouble with his balance. Some occasional chest pain but nothing that really worries him. Did have a spell this past Friday where his words were slurred and he had trouble walking. He did not seek evaluation. Symptoms abated within a few hours and he was fine the following day. He tells me that he "knows he is old".    Current Outpatient Prescriptions on File Prior to Visit  Medication Sig Dispense Refill  . aspirin 325 MG tablet Take 325 mg by mouth daily.        Marland Kitchen glipiZIDE (GLUCOTROL) 5 MG tablet Take 5 mg by mouth 2 (two) times daily before a meal.        . metoprolol tartrate (LOPRESSOR) 25 MG tablet Take 25 mg by mouth 2 (two) times daily.        . simvastatin (ZOCOR) 40 MG tablet Take 40 mg by mouth at bedtime.        . Tamsulosin HCl (FLOMAX) 0.4 MG CAPS Take 0.4 mg by mouth daily.         No current facility-administered medications on file prior to visit.    Allergies  Allergen Reactions  . Ace Inhibitors     Past Medical History  Diagnosis Date  . Hypertension   . Hyperlipidemia   . Diabetes mellitus   . Coronary artery disease     s/p CABG 1996  . Sick sinus syndrome     s/p PPM 2001 (R sided),  RV lead fracture 10/12 with new system (MDT) placed on L side due to R sided venous occlusion  . Diabetic neuropathy   . CVD (cardiovascular disease)       s/p CEA    Past Surgical History  Procedure Laterality Date  . Coronary artery bypass graft    . Carotid endarterectomy    . Pacemaker insertion      s/p PPM 2001 (R sided),  RV lead fracture 10/12 with new system (MDT) placed on Left side due to R sided venous occlusion    History  Smoking status  . Former Smoker  . Quit date: 11/07/1975  Smokeless tobacco  . Never Used    History  Alcohol Use No    History reviewed. No pertinent family history.  Review of Systems: The review of systems is per the HPI.  All other systems were reviewed and are negative.  Physical Exam: BP 162/84  Pulse 68  Ht 5' 10.5" (1.791 m)  Wt 190 lb (86.183 kg)  BMI 26.87 kg/m2 Repeat BP by me is down to 140/60.  Patient is very pleasant and in no acute distress. Looks chronically [name]. Skin is warm and dry. Color is pale.  HEENT is unremarkable. Normocephalic/atraumatic. PERRL. Sclera are nonicteric. Neck is supple. No masses. No JVD. Lungs are clear.  Cardiac exam shows a regular rate and rhythm. Abdomen is soft. Extremities are with trace edema. Gait is not tested. No gross neurologic deficits noted.  LABORATORY DATA: PENDING  Lab Results  Component Value Date   WBC 8.1 10/25/2010   HGB 12.3* 10/25/2010   HCT 37.9* 10/25/2010   PLT 208 10/25/2010   GLUCOSE 98 10/25/2010   ALT 19 10/24/2010   AST 16 10/24/2010   NA 140 10/25/2010   K 4.7 10/25/2010   CL 104 10/25/2010   CREATININE 1.62* 10/25/2010   BUN 38* 10/25/2010   CO2 25 10/25/2010   INR 0.96 10/24/2010     Assessment / Plan: 1. Underlying pacemaker - his rate responsiveness was not like it had been. Reprogrammed from medium/low to low per the device clinic today.   2. Possible TIA - he is not interested in a "big evaluation" - will check some baseline labs and arrange for CT of the head. If he has recurrence, he will need further testing. I have left him on his current regimen for now. I have asked him to alert his family if he has  recurrence and seek medical attention.   3. Advanced age  Will tentatively see him back as planned.   Patient is agreeable to this plan and will call if any problems develop in the interim.   Rosalio Macadamia, RN, ANP-C [facility] [address] [place], [place]  [address]

## 2012-07-01 ENCOUNTER — Encounter: Payer: Self-pay | Admitting: *Deleted

## 2012-07-16 ENCOUNTER — Encounter: Payer: Self-pay | Admitting: Internal Medicine

## 2012-09-22 ENCOUNTER — Ambulatory Visit (INDEPENDENT_AMBULATORY_CARE_PROVIDER_SITE_OTHER): Payer: Medicare Other | Admitting: *Deleted

## 2012-09-22 DIAGNOSIS — I495 Sick sinus syndrome: Secondary | ICD-10-CM

## 2012-09-22 DIAGNOSIS — Z950 Presence of cardiac pacemaker: Secondary | ICD-10-CM

## 2012-10-02 LAB — REMOTE PACEMAKER DEVICE
BAMS-0001: 150 {beats}/min
RV LEAD THRESHOLD: 0.5 V

## 2012-10-14 ENCOUNTER — Encounter: Payer: Self-pay | Admitting: *Deleted

## 2012-10-23 ENCOUNTER — Encounter: Payer: Self-pay | Admitting: Internal Medicine

## 2012-12-10 ENCOUNTER — Encounter: Payer: Self-pay | Admitting: Internal Medicine

## 2012-12-10 ENCOUNTER — Encounter (INDEPENDENT_AMBULATORY_CARE_PROVIDER_SITE_OTHER): Payer: Self-pay

## 2012-12-10 ENCOUNTER — Ambulatory Visit (INDEPENDENT_AMBULATORY_CARE_PROVIDER_SITE_OTHER): Payer: Medicare Other | Admitting: Internal Medicine

## 2012-12-10 VITALS — BP 137/79 | HR 90 | Ht 70.5 in | Wt 206.2 lb

## 2012-12-10 DIAGNOSIS — I10 Essential (primary) hypertension: Secondary | ICD-10-CM

## 2012-12-10 DIAGNOSIS — I495 Sick sinus syndrome: Secondary | ICD-10-CM

## 2012-12-10 DIAGNOSIS — Z950 Presence of cardiac pacemaker: Secondary | ICD-10-CM

## 2012-12-10 DIAGNOSIS — I2510 Atherosclerotic heart disease of native coronary artery without angina pectoris: Secondary | ICD-10-CM

## 2012-12-10 DIAGNOSIS — I442 Atrioventricular block, complete: Secondary | ICD-10-CM

## 2012-12-10 NOTE — Progress Notes (Signed)
PCP: Lindwood Qua, MD  [name] is a 79 y.o. male who presents today for routine electrophysiology followup.  Since last being seen in our clinic, the patient reports doing well for his age.  Today, he denies symptoms of palpitations, chest pain, shortness of breath, dizziness, presyncope, or syncope.   He has stable edema.  He has had no further neuro symptoms since being see by [name].  The patient is otherwise without complaint today.   Past Medical History  Diagnosis Date  . Hypertension   . Hyperlipidemia   . Diabetes mellitus   . Coronary artery disease     s/p CABG 1996  . Sick sinus syndrome     s/p PPM 2001 (R sided),  RV lead fracture 10/12 with new system (MDT) placed on L side due to R sided venous occlusion  . Diabetic neuropathy   . CVD (cardiovascular disease)     s/p CEA   Past Surgical History  Procedure Laterality Date  . Coronary artery bypass graft    . Carotid endarterectomy    . Pacemaker insertion      s/p PPM 2001 (R sided),  RV lead fracture 10/12 with new system (MDT) placed on Left side due to R sided venous occlusion    Current Outpatient Prescriptions  Medication Sig Dispense Refill  . aspirin 325 MG tablet Take 325 mg by mouth daily.        Marland Kitchen glipiZIDE (GLUCOTROL) 5 MG tablet Take 5 mg by mouth 2 (two) times daily before a meal.        . metoprolol tartrate (LOPRESSOR) 25 MG tablet Take 25 mg by mouth 2 (two) times daily.        . simvastatin (ZOCOR) 40 MG tablet Take 40 mg by mouth at bedtime.        . Tamsulosin HCl (FLOMAX) 0.4 MG CAPS Take 0.4 mg by mouth daily.         No current facility-administered medications for this visit.    Physical Exam: Filed Vitals:   12/10/12 1558  BP: 137/79  Pulse: 90  Height: 5' 10.5" (1.791 m)  Weight: 206 lb 3.2 oz (93.532 kg)    GEN- The patient is elderly appearing, alert and oriented x 3 today.   Head- normocephalic, atraumatic Eyes-  Sclera clear, conjunctiva pink Ears- hearing  intact Oropharynx- clear Lungs- Clear to ausculation bilaterally, normal work of breathing Chest- L pacemaker pocket is well healed, his previously implanted pacemaker has been abandoned on the R side Heart- Regular rate and rhythm, no murmurs, rubs or gallops, PMI not laterally displaced GI- soft, NT, ND, + BS Extremities- no clubbing, cyanosis, +1 edema  Pacemaker interrogation- reviewed in detail today,  See PACEART report  Assessment and Plan:  1. Sick sinus syndrome, complete heart block Normal pacemaker function See Pace Art report No changes today  2. HTN Stable No change required today  3. CAD Stable No change required today  Carelink  Return to see [name] in 6 months I will see in a year

## 2012-12-10 NOTE — Patient Instructions (Signed)
Your physician wants you to follow-up in: 6 months with Lori Gerhardt,NP and 12 months with Dr Allred You will receive a reminder letter in the mail two months in advance. If you don't receive a letter, please call our office to schedule the follow-up appointment.   Remote monitoring is used to monitor your Pacemaker or ICD from home. This monitoring reduces the number of office visits required to check your device to one time per year. It allows us to keep an eye on the functioning of your device to ensure it is working properly. You are scheduled for a device check from home on 03/15/13. You may send your transmission at any time that day. If you have a wireless device, the transmission will be sent automatically. After your physician reviews your transmission, you will receive a postcard with your next transmission date.   

## 2012-12-11 LAB — MDC_IDC_ENUM_SESS_TYPE_INCLINIC
Brady Statistic AP VP Percent: 97 %
Brady Statistic AS VP Percent: 3 %
Brady Statistic AS VS Percent: 0 %
Date Time Interrogation Session: 20141120220221
Lead Channel Impedance Value: 426 Ohm
Lead Channel Impedance Value: 526 Ohm
Lead Channel Pacing Threshold Amplitude: 0.5 V
Lead Channel Pacing Threshold Pulse Width: 0.4 ms
Lead Channel Setting Pacing Amplitude: 2.5 V

## 2013-03-15 ENCOUNTER — Ambulatory Visit (INDEPENDENT_AMBULATORY_CARE_PROVIDER_SITE_OTHER): Payer: Medicare Other | Admitting: *Deleted

## 2013-03-15 DIAGNOSIS — I442 Atrioventricular block, complete: Secondary | ICD-10-CM

## 2013-03-15 LAB — MDC_IDC_ENUM_SESS_TYPE_REMOTE
Battery Impedance: 252 Ohm
Battery Voltage: 2.78 V
Brady Statistic AP VS Percent: 0 %
Lead Channel Impedance Value: 416 Ohm
Lead Channel Pacing Threshold Amplitude: 0.625 V
Lead Channel Pacing Threshold Pulse Width: 0.4 ms
Lead Channel Setting Pacing Amplitude: 2 V
Lead Channel Setting Pacing Pulse Width: 0.4 ms
MDC IDC MSMT BATTERY REMAINING LONGEVITY: 83 mo
MDC IDC MSMT LEADCHNL RA PACING THRESHOLD PULSEWIDTH: 0.4 ms
MDC IDC MSMT LEADCHNL RV IMPEDANCE VALUE: 503 Ohm
MDC IDC MSMT LEADCHNL RV PACING THRESHOLD AMPLITUDE: 0.5 V
MDC IDC SESS DTM: 20150223150300
MDC IDC SET LEADCHNL RV PACING AMPLITUDE: 2.5 V
MDC IDC SET LEADCHNL RV SENSING SENSITIVITY: 5.6 mV
MDC IDC STAT BRADY AP VP PERCENT: 98 %
MDC IDC STAT BRADY AS VP PERCENT: 2 %
MDC IDC STAT BRADY AS VS PERCENT: 0 %

## 2013-03-22 ENCOUNTER — Encounter: Payer: Self-pay | Admitting: *Deleted

## 2013-03-29 ENCOUNTER — Encounter: Payer: Self-pay | Admitting: Internal Medicine

## 2013-06-08 ENCOUNTER — Encounter: Payer: Self-pay | Admitting: Nurse Practitioner

## 2013-06-08 ENCOUNTER — Ambulatory Visit (INDEPENDENT_AMBULATORY_CARE_PROVIDER_SITE_OTHER): Payer: Medicare Other | Admitting: Nurse Practitioner

## 2013-06-08 VITALS — BP 140/60 | HR 68 | Ht 70.5 in | Wt 210.0 lb

## 2013-06-08 DIAGNOSIS — I10 Essential (primary) hypertension: Secondary | ICD-10-CM

## 2013-06-08 DIAGNOSIS — Z950 Presence of cardiac pacemaker: Secondary | ICD-10-CM

## 2013-06-08 NOTE — Progress Notes (Signed)
Sarina IllAustin L Chock Date of Birth: January 11, 1921 Medical Record #[id number]  History of Present Illness: Mr. Logan Boresvans is seen back today for a follow up visit. Seen for Dr. Johney FrameAllred. He is a 80 year old male with HTN, HLD DM, remote CABG for CAD, underlying PPM for SSS with subsequent RV lead fracture in 2012 with new system placed on left side due to right sided venous occlusion, PVD with past CEA.  Last seen here in November. Seemed to be doing ok.  Comes back today. Here with his son. Doing ok. Says he really has no complaint. Still gets short of breath if he does a lot. Gives out easily. No chest pain. Some swelling. Was given prn lasix to use by his PCP earlier this month. Tolerating his medicines. Probably gets too much salt. Lost his wife of 8171 years this past January. Labs are checked by PCP who he sees every 3 months.   Current Outpatient Prescriptions  Medication Sig Dispense Refill  . aspirin 325 MG tablet Take 325 mg by mouth daily.        . furosemide (LASIX) 40 MG tablet Take 40 mg by mouth as needed.       Marland Kitchen. glipiZIDE (GLUCOTROL) 5 MG tablet Take 5 mg by mouth 2 (two) times daily before a meal.        . metoprolol tartrate (LOPRESSOR) 25 MG tablet Take 25 mg by mouth 2 (two) times daily.        . simvastatin (ZOCOR) 40 MG tablet Take 40 mg by mouth at bedtime.        . Tamsulosin HCl (FLOMAX) 0.4 MG CAPS Take 0.4 mg by mouth daily.         No current facility-administered medications for this visit.    Allergies  Allergen Reactions  . Ace Inhibitors     Past Medical History  Diagnosis Date  . Hypertension   . Hyperlipidemia   . Diabetes mellitus   . Coronary artery disease     s/p CABG 1996  . Sick sinus syndrome     s/p PPM 2001 (R sided),  RV lead fracture 10/12 with new system (MDT) placed on L side due to R sided venous occlusion  . Diabetic neuropathy   . CVD (cardiovascular disease)     s/p CEA    Past Surgical History  Procedure Laterality Date  . Coronary  artery bypass graft    . Carotid endarterectomy    . Pacemaker insertion      s/p PPM 2001 (R sided),  RV lead fracture 10/12 with new system (MDT) placed on Left side due to R sided venous occlusion    History  Smoking status  . Former Smoker  . Quit date: 11/07/1975  Smokeless tobacco  . Never Used    History  Alcohol Use No    History reviewed. No pertinent family history.  Review of Systems: The review of systems is per the HPI.  All other systems were reviewed and are negative.  Physical Exam: BP 140/60  Pulse 68  Ht 5' 10.5" (1.791 m)  Wt 210 lb (95.255 kg)  BMI 29.70 kg/m2 Patient is very pleasant and in no acute distress. Skin is warm and dry. Color is normal.  HEENT is unremarkable but has missing teeth. Normocephalic/atraumatic. PERRL. Sclera are nonicteric. Neck is supple. No masses. No JVD. Lungs are clear. Cardiac exam shows a regular rate and rhythm. Abdomen is soft. Extremities are without edema. Legs little cool to  touch. Distal pulses diminished.  Gait not tested.  No gross neurologic deficits noted.  LABORATORY DATA:  Lab Results  Component Value Date   WBC 8.3 06/23/2012   HGB 12.4* 06/23/2012   HCT 38.1* 06/23/2012   PLT 241.0 06/23/2012   GLUCOSE 109* 06/23/2012   ALT 19 10/24/2010   AST 16 10/24/2010   NA 143 06/23/2012   K 5.5* 06/23/2012   CL 110 06/23/2012   CREATININE 1.5 06/23/2012   BUN 35* 06/23/2012   CO2 23 06/23/2012   TSH 1.64 06/23/2012   INR 0.96 10/24/2010    Assessment / Plan: 1. HTN - BP fair. No changes in current regimen. He seems to be holding his own.   2. CAD - managed medically.  3. Underlying PPM  See Dr. Johney FrameAllred in November.   Patient is agreeable to this plan and will call if any problems develop in the interim.   Rosalio MacadamiaLori C. Ajene Carchi, RN, ANP-C [facility] [address] [place], [place]  [id number] [phone]

## 2013-06-08 NOTE — Patient Instructions (Signed)
I think you are doing ok  Stay on your current medicines  See Dr. Johney FrameAllred back in November  Call the [facility] office at [phone] if you have any questions, problems or concerns.

## 2013-06-16 ENCOUNTER — Encounter: Payer: Self-pay | Admitting: Internal Medicine

## 2013-06-16 ENCOUNTER — Ambulatory Visit (INDEPENDENT_AMBULATORY_CARE_PROVIDER_SITE_OTHER): Payer: Medicare Other | Admitting: *Deleted

## 2013-06-16 DIAGNOSIS — I495 Sick sinus syndrome: Secondary | ICD-10-CM

## 2013-06-16 LAB — MDC_IDC_ENUM_SESS_TYPE_REMOTE
Battery Remaining Longevity: 77 mo
Date Time Interrogation Session: 20150527143210
Lead Channel Impedance Value: 499 Ohm
Lead Channel Pacing Threshold Amplitude: 0.5 V
Lead Channel Pacing Threshold Pulse Width: 0.4 ms
Lead Channel Setting Pacing Amplitude: 2.5 V
Lead Channel Setting Sensing Sensitivity: 5.6 mV
MDC IDC MSMT BATTERY IMPEDANCE: 326 Ohm
MDC IDC MSMT BATTERY VOLTAGE: 2.78 V
MDC IDC MSMT LEADCHNL RA IMPEDANCE VALUE: 409 Ohm
MDC IDC MSMT LEADCHNL RA PACING THRESHOLD AMPLITUDE: 0.75 V
MDC IDC MSMT LEADCHNL RA PACING THRESHOLD PULSEWIDTH: 0.4 ms
MDC IDC SET LEADCHNL RA PACING AMPLITUDE: 2 V
MDC IDC SET LEADCHNL RV PACING PULSEWIDTH: 0.4 ms
MDC IDC STAT BRADY AP VP PERCENT: 99 %
MDC IDC STAT BRADY AP VS PERCENT: 0 %
MDC IDC STAT BRADY AS VP PERCENT: 1 %
MDC IDC STAT BRADY AS VS PERCENT: 0 %

## 2013-06-16 NOTE — Progress Notes (Signed)
Remote pacemaker transmission.   

## 2013-07-02 ENCOUNTER — Encounter: Payer: Self-pay | Admitting: Cardiology

## 2013-09-21 ENCOUNTER — Ambulatory Visit (INDEPENDENT_AMBULATORY_CARE_PROVIDER_SITE_OTHER): Payer: Medicare Other | Admitting: *Deleted

## 2013-09-21 ENCOUNTER — Telehealth: Payer: Self-pay | Admitting: Cardiology

## 2013-09-21 DIAGNOSIS — I495 Sick sinus syndrome: Secondary | ICD-10-CM

## 2013-09-21 LAB — MDC_IDC_ENUM_SESS_TYPE_REMOTE
Battery Impedance: 352 Ohm
Battery Voltage: 2.78 V
Brady Statistic AP VP Percent: 99 %
Brady Statistic AP VS Percent: 0 %
Brady Statistic AS VP Percent: 1 %
Brady Statistic AS VS Percent: 0 %
Lead Channel Impedance Value: 426 Ohm
Lead Channel Pacing Threshold Amplitude: 0.75 V
Lead Channel Pacing Threshold Pulse Width: 0.4 ms
Lead Channel Setting Pacing Amplitude: 2.5 V
Lead Channel Setting Pacing Pulse Width: 0.4 ms
MDC IDC MSMT BATTERY REMAINING LONGEVITY: 75 mo
MDC IDC MSMT LEADCHNL RV IMPEDANCE VALUE: 488 Ohm
MDC IDC MSMT LEADCHNL RV PACING THRESHOLD AMPLITUDE: 0.5 V
MDC IDC MSMT LEADCHNL RV PACING THRESHOLD PULSEWIDTH: 0.4 ms
MDC IDC SESS DTM: 20150901145336
MDC IDC SET LEADCHNL RA PACING AMPLITUDE: 2 V
MDC IDC SET LEADCHNL RV SENSING SENSITIVITY: 5.6 mV

## 2013-09-21 NOTE — Telephone Encounter (Signed)
Spoke with pt and reminded pt of remote transmission that is due today. Pt verbalized understanding.   

## 2013-09-22 NOTE — Progress Notes (Signed)
Remote pacemaker transmission.   

## 2013-09-29 ENCOUNTER — Encounter: Payer: Self-pay | Admitting: Cardiology

## 2013-10-21 ENCOUNTER — Encounter: Payer: Self-pay | Admitting: Internal Medicine

## 2013-12-13 ENCOUNTER — Encounter: Payer: Self-pay | Admitting: Internal Medicine

## 2013-12-13 ENCOUNTER — Ambulatory Visit (INDEPENDENT_AMBULATORY_CARE_PROVIDER_SITE_OTHER): Payer: Medicare Other | Admitting: Internal Medicine

## 2013-12-13 VITALS — BP 150/90 | HR 89 | Ht 70.0 in | Wt 213.1 lb

## 2013-12-13 DIAGNOSIS — I10 Essential (primary) hypertension: Secondary | ICD-10-CM

## 2013-12-13 DIAGNOSIS — I495 Sick sinus syndrome: Secondary | ICD-10-CM

## 2013-12-13 DIAGNOSIS — I442 Atrioventricular block, complete: Secondary | ICD-10-CM

## 2013-12-13 LAB — MDC_IDC_ENUM_SESS_TYPE_INCLINIC
Battery Impedance: 402 Ohm
Battery Remaining Longevity: 73 mo
Battery Voltage: 2.78 V
Brady Statistic AS VP Percent: 1 %
Lead Channel Impedance Value: 439 Ohm
Lead Channel Pacing Threshold Amplitude: 0.5 V
Lead Channel Pacing Threshold Amplitude: 0.75 V
Lead Channel Setting Pacing Amplitude: 2 V
Lead Channel Setting Pacing Amplitude: 2.5 V
Lead Channel Setting Sensing Sensitivity: 5.6 mV
MDC IDC MSMT LEADCHNL RA PACING THRESHOLD PULSEWIDTH: 0.4 ms
MDC IDC MSMT LEADCHNL RV IMPEDANCE VALUE: 504 Ohm
MDC IDC MSMT LEADCHNL RV PACING THRESHOLD PULSEWIDTH: 0.4 ms
MDC IDC SESS DTM: 20151123164230
MDC IDC SET LEADCHNL RV PACING PULSEWIDTH: 0.4 ms
MDC IDC STAT BRADY AP VP PERCENT: 99 %
MDC IDC STAT BRADY AP VS PERCENT: 0 %
MDC IDC STAT BRADY AS VS PERCENT: 0 %

## 2013-12-13 NOTE — Progress Notes (Signed)
PCP: [name],[name], MD  [name] is a 80 y.o. male who presents today for routine electrophysiology followup.  Since last being seen in our clinic, the patient reports doing well for his age.  He enjoys watching nascar on TV.  Today, he denies symptoms of palpitations, chest pain, shortness of breath, dizziness, presyncope, or syncope.   He has stable edema. The patient is otherwise without complaint today.   Past Medical History  Diagnosis Date  . Hypertension   . Hyperlipidemia   . Diabetes mellitus   . Coronary artery disease     s/p CABG 1996  . Sick sinus syndrome     s/p PPM 2001 (R sided),  RV lead fracture 10/12 with new system (MDT) placed on L side due to R sided venous occlusion  . Diabetic neuropathy   . CVD (cardiovascular disease)     s/p CEA   Past Surgical History  Procedure Laterality Date  . Coronary artery bypass graft    . Carotid endarterectomy    . Pacemaker insertion      s/p PPM 2001 (R sided),  RV lead fracture 10/12 with new system (MDT) placed on Left side due to R sided venous occlusion    Current Outpatient Prescriptions  Medication Sig Dispense Refill  . aspirin 325 MG tablet Take 325 mg by mouth daily.      . furosemide (LASIX) 40 MG tablet Take 40 mg by mouth daily as needed for fluid.     Marland Kitchen. glipiZIDE (GLUCOTROL) 5 MG tablet Take 5 mg by mouth 2 (two) times daily before a meal.      . metoprolol tartrate (LOPRESSOR) 25 MG tablet Take 25 mg by mouth 2 (two) times daily.      . polyethylene glycol powder (GLYCOLAX/MIRALAX) powder Take by mouth.    . simvastatin (ZOCOR) 40 MG tablet Take 40 mg by mouth at bedtime.      . Tamsulosin HCl (FLOMAX) 0.4 MG CAPS Take 0.4 mg by mouth daily.       No current facility-administered medications for this visit.    Physical Exam: Filed Vitals:   12/13/13 1558  BP: 150/90  Pulse: 89  Height: 5\' 10"  (1.778 m)  Weight: 213 lb 1.9 oz (96.671 kg)    GEN- The patient is elderly appearing, alert and  oriented x 3 today.   Head- normocephalic, atraumatic Eyes-  Sclera clear, conjunctiva pink Ears- hearing intact Oropharynx- clear Lungs- Clear to ausculation bilaterally, normal work of breathing Chest- L pacemaker pocket is well healed, his previously implanted pacemaker has been abandoned on the R side Heart- Regular rate and rhythm, no murmurs, rubs or gallops, PMI not laterally displaced GI- soft, NT, ND, + BS Extremities- no clubbing, cyanosis, +1 edema  Pacemaker interrogation- reviewed in detail today,  See PACEART report  Assessment and Plan:  1. Sick sinus syndrome, complete heart block Normal pacemaker function See Pace Art report No changes today  2. HTN Stable No change required today  3. CAD Stable No change required today  [facility]  I will see in a year

## 2013-12-13 NOTE — Patient Instructions (Signed)
Your physician wants you to follow-up in: 12 months with Dr. Allred You will receive a reminder letter in the mail two months in advance. If you don't receive a letter, please call our office to schedule the follow-up appointment.   Remote monitoring is used to monitor your Pacemaker or ICD from home. This monitoring reduces the number of office visits required to check your device to one time per year. It allows us to keep an eye on the functioning of your device to ensure it is working properly. You are scheduled for a device check from home on 03/16/14. You may send your transmission at any time that day. If you have a wireless device, the transmission will be sent automatically. After your physician reviews your transmission, you will receive a postcard with your next transmission date.   

## 2013-12-14 ENCOUNTER — Encounter: Payer: Self-pay | Admitting: Internal Medicine

## 2014-01-21 HISTORY — PX: PERIPHERAL ARTERIAL STENT GRAFT: SHX2220

## 2014-03-01 ENCOUNTER — Ambulatory Visit (INDEPENDENT_AMBULATORY_CARE_PROVIDER_SITE_OTHER): Payer: Medicare Other

## 2014-03-01 ENCOUNTER — Ambulatory Visit (INDEPENDENT_AMBULATORY_CARE_PROVIDER_SITE_OTHER): Payer: Medicare Other | Admitting: Podiatry

## 2014-03-01 VITALS — BP 147/83 | HR 73 | Temp 97.6°F

## 2014-03-01 DIAGNOSIS — L03032 Cellulitis of left toe: Secondary | ICD-10-CM

## 2014-03-01 DIAGNOSIS — L97521 Non-pressure chronic ulcer of other part of left foot limited to breakdown of skin: Secondary | ICD-10-CM

## 2014-03-01 MED ORDER — CEPHALEXIN 500 MG PO CAPS: 500.0000 mg | ORAL_CAPSULE | Freq: Three times a day (TID) | ORAL | Status: DC

## 2014-03-01 NOTE — Progress Notes (Signed)
Subjective:     Patient ID: [name], male   DOB: 25-Jul-1920, 81 y.o.   MRN: [id number]  HPI patient presents with his son stating that he had a small puncture on the bottom of his left heel and he has nerve damage cannot feel his feet and it was worked on by his family physician. He now has a small area on the outside of his left heel in the back of his heel that are crusted and he states are not currently draining. He has had no fever chills or any systemic indication of infection   Review of Systems  All other systems reviewed and are negative.      Objective:   Physical Exam  Constitutional: He is oriented to person, place, and time.  Musculoskeletal: Normal range of motion.  Neurological: He is oriented to person, place, and time.  Skin: Skin is warm and dry.  Nursing note and vitals reviewed.  neurovascular status are diminished on both feet with diminishment of sharp Dole vibratory and diminishment of PT and DP pulses left over right. He has crusted area on the left heel plantar left lateral heel and a small area on his Achilles tendon with all these areas not showing current drainage but there is redness surrounding the area on his Achilles tendon. Patient also complains of pain     Assessment:     Probable puncture room with soft tissue cellulitis that does not appear to be spreading currently but I do think lack of circulation is causing the problem to be worsened    Plan:     H&P and condition discussed with him and his son. Understands that the possibility exists he may require amputation long-term but at this point we are sending him to vascular as soon as possible and I applied Silvadene to the lesions and placed on cephalexin 3 times a day for the next 10 days. Gave strict instructions if he should develop any systemic signs of infection or if he should develop any fever or chills to go straight to the emergency room

## 2014-03-01 NOTE — Progress Notes (Signed)
   Subjective:    Patient ID: [name], male    DOB: 03/06/1920, 81 y.o.   MRN: [id number]  HPI Comments: "I have a place on my foot"  Patient c/o wounds left heel, plantar and posterior, for about 3 weeks. He states that Dr. Mikey BussingHoffman and his nurse thought he might have a wart and froze the area to the plantar heel x 2. The area started to darken and become sore. The area posterior is red, draining and dark. The whole foot is very swollen and red. Keeping areas covered.     Review of Systems  Respiratory: Positive for shortness of breath.   Cardiovascular: Positive for leg swelling.  Skin: Positive for color change, rash and wound.  All other systems reviewed and are negative.      Objective:   Physical Exam        Assessment & Plan:

## 2014-03-02 ENCOUNTER — Telehealth: Payer: Self-pay | Admitting: *Deleted

## 2014-03-02 NOTE — Telephone Encounter (Signed)
"  We received a referral from Dr. Charlsie Merlesegal.  It is conflicting, we don't know if he just wants ABIs or if he wants a lower arterial doppler."  I informed her that he needs ABIs and a lower arterial.  "We don't have the time to do this.  We have the time to do the  ABIs because we have some 30 minute slots but no times to do a lower arterial.  We only have one tech.  You may have to refer him to the hospital."

## 2014-03-14 ENCOUNTER — Ambulatory Visit: Payer: Self-pay | Admitting: Vascular Surgery

## 2014-03-16 ENCOUNTER — Ambulatory Visit (INDEPENDENT_AMBULATORY_CARE_PROVIDER_SITE_OTHER): Payer: Medicare Other | Admitting: *Deleted

## 2014-03-16 DIAGNOSIS — I495 Sick sinus syndrome: Secondary | ICD-10-CM

## 2014-03-16 NOTE — Progress Notes (Signed)
Remote pacemaker transmission.   

## 2014-03-17 LAB — MDC_IDC_ENUM_SESS_TYPE_REMOTE
Battery Voltage: 2.78 V
Brady Statistic AP VS Percent: 0 %
Brady Statistic AS VP Percent: 1 %
Brady Statistic AS VS Percent: 0 %
Date Time Interrogation Session: 20160224134857
Lead Channel Impedance Value: 404 Ohm
Lead Channel Impedance Value: 464 Ohm
Lead Channel Pacing Threshold Amplitude: 0.5 V
Lead Channel Pacing Threshold Amplitude: 0.625 V
Lead Channel Pacing Threshold Pulse Width: 0.4 ms
Lead Channel Setting Pacing Amplitude: 2.5 V
Lead Channel Setting Pacing Pulse Width: 0.4 ms
MDC IDC MSMT BATTERY IMPEDANCE: 478 Ohm
MDC IDC MSMT BATTERY REMAINING LONGEVITY: 67 mo
MDC IDC MSMT LEADCHNL RA PACING THRESHOLD PULSEWIDTH: 0.4 ms
MDC IDC SET LEADCHNL RA PACING AMPLITUDE: 2 V
MDC IDC SET LEADCHNL RV SENSING SENSITIVITY: 5.6 mV
MDC IDC STAT BRADY AP VP PERCENT: 98 %

## 2014-03-21 ENCOUNTER — Encounter: Payer: Self-pay | Admitting: Cardiology

## 2014-03-28 ENCOUNTER — Encounter: Payer: Self-pay | Admitting: Internal Medicine

## 2014-04-12 ENCOUNTER — Ambulatory Visit: Payer: Medicare Other | Admitting: Podiatry

## 2014-04-22 ENCOUNTER — Ambulatory Visit (INDEPENDENT_AMBULATORY_CARE_PROVIDER_SITE_OTHER): Payer: Medicare Other | Admitting: Podiatry

## 2014-04-22 ENCOUNTER — Encounter: Payer: Self-pay | Admitting: Podiatry

## 2014-04-22 VITALS — BP 125/70 | HR 77 | Resp 16 | Ht 70.0 in | Wt 206.0 lb

## 2014-04-22 DIAGNOSIS — L89891 Pressure ulcer of other site, stage 1: Secondary | ICD-10-CM

## 2014-04-22 DIAGNOSIS — L03032 Cellulitis of left toe: Secondary | ICD-10-CM

## 2014-04-22 DIAGNOSIS — L97521 Non-pressure chronic ulcer of other part of left foot limited to breakdown of skin: Secondary | ICD-10-CM

## 2014-04-25 NOTE — Progress Notes (Signed)
Subjective:     Patient ID: [name], male   DOB: 06/19/1920, 81 y.o.   MRN: [id number]  HPI patient states the pain comes and goes and my foot. States it's improved since treatment   Review of Systems     Objective:   Physical Exam Neurovascular status found to be unchanged with patient well oriented and noted to have significant diminishment of pain upon palpation    Assessment:     Improved inflammatory process with pain which has receded    Plan:     Advised on physical therapy anti-inflammatories and compression. Surgical shoe as needed and wide supportive shoes will be utilized. Reappoint to recheck

## 2014-04-28 ENCOUNTER — Ambulatory Visit
Admit: 2014-04-28 | Disposition: A | Discharge status: Home / Self Care | Payer: Self-pay | Attending: Surgery | Admitting: Surgery

## 2014-04-28 ENCOUNTER — Encounter
Admit: 2014-04-28 | Disposition: A | Discharge status: Home / Self Care | Payer: Self-pay | Attending: Surgery | Admitting: Surgery

## 2014-05-04 LAB — WOUND AEROBIC CULTURE

## 2014-05-10 ENCOUNTER — Ambulatory Visit
Admit: 2014-05-10 | Disposition: A | Discharge status: Home / Self Care | Payer: Self-pay | Attending: Surgery | Admitting: Surgery

## 2014-05-12 ENCOUNTER — Ambulatory Visit
Admit: 2014-05-12 | Disposition: A | Discharge status: Home / Self Care | Payer: Self-pay | Attending: Surgery | Admitting: Surgery

## 2014-05-12 LAB — CBC WITH DIFFERENTIAL/PLATELET
BASOS ABS: 0 10*3/uL (ref 0.0–0.1)
Basophil %: 0.6 %
Eosinophil #: 0.2 10*3/uL (ref 0.0–0.7)
Eosinophil %: 2.8 %
HCT: 37 % — AB (ref 40.0–52.0)
HGB: 12.1 g/dL — ABNORMAL LOW (ref 13.0–18.0)
LYMPHS ABS: 1.5 10*3/uL (ref 1.0–3.6)
Lymphocyte %: 18.7 %
MCH: 28.7 pg (ref 26.0–34.0)
MCHC: 32.7 g/dL (ref 32.0–36.0)
MCV: 88 fL (ref 80–100)
Monocyte #: 0.9 x10 3/mm (ref 0.2–1.0)
Monocyte %: 10.9 %
Neutrophil #: 5.4 10*3/uL (ref 1.4–6.5)
Neutrophil %: 67 %
PLATELETS: 276 10*3/uL (ref 150–440)
RBC: 4.2 10*6/uL — ABNORMAL LOW (ref 4.40–5.90)
RDW: 13.4 % (ref 11.5–14.5)
WBC: 8.1 10*3/uL (ref 3.8–10.6)

## 2014-05-13 LAB — HEMOGLOBIN A1C: Hemoglobin A1C: 8.5 % — ABNORMAL HIGH

## 2014-05-17 ENCOUNTER — Ambulatory Visit
Admit: 2014-05-17 | Disposition: A | Discharge status: Home / Self Care | Payer: Self-pay | Attending: Surgery | Admitting: Surgery

## 2014-05-22 NOTE — Op Note (Signed)
PATIENT NAME:  [name], [name] MR#:  [id number] DATE OF BIRTH:  [birth year]  DATE OF PROCEDURE:  03/14/2014  PREOPERATIVE DIAGNOSES:  1.  Peripheral arterial disease with ulceration and gangrenous changes in the left lower extremity.  2.  Chronic kidney disease stage 3-4. 3.  Coronary artery disease.  4.  Diabetes.  5.  Hypertension.   POSTOPERATIVE DIAGNOSES: 1.  Peripheral arterial disease with ulceration and gangrenous changes in the left lower extremity.  2.  Chronic kidney disease stage 3-4. 3.  Coronary artery disease.  4.  Diabetes.  5.  Hypertension.   PROCEDURE: 1.  Ultrasound guidance for vascular access to right femoral artery.  2.  Catheter placement to left peroneal artery from right femoral approach.  3.  Aortogram and selective left lower extremity angiogram.  4.  Percutaneous transluminal angioplasty of left peroneal artery with 3 mm diameter angioplasty balloon.  5.  Percutaneous transluminal angioplasty of tibioperoneal trunk with 3 mm diameter conventional and 4 mm diameter Lutonix drug-coated angioplasty balloon.  6.  Percutaneous transluminal angioplasty of distal SFA and above-knee popliteal artery with 5 mm diameter Lutonix drug-coated angioplasty balloon and 6 mm diameter conventional angioplasty balloon.  7.  Subcutaneous stent placement to distal superficial femoral artery for residual stenosis after angioplasty with 6 mm diameter x 67 mm length self-expanding stent. 8.  Star close closure device, right femoral artery.   SURGEON: Annice Needy, M.D.   ANESTHESIA: Local with moderate conscious sedation.   ESTIMATED BLOOD LOSS: 25 mL.  FLUOROSCOPY TIME: Six minutes.   CONTRAST: 80 mL.   INDICATION FOR PROCEDURE: This is a 81 year old gentleman who presented in my office last week with severe ischemia of the left lower extremity with nonhealing ulceration and gangrenous changes. We are performing revascularization for an attempt at limb salvage. The risks and  benefits were discussed. Informed consent was obtained.   DESCRIPTION OF PROCEDURE: The patient is brought to the vascular suite. Groins were shaved and prepped and a sterile surgical field was created. The right femoral artery was accessed under direct ultrasound guidance without difficulty with a Seldinger needle and a permanent image was recorded. A J-wire and 5 French sheath were then placed. Pigtail catheter was placed in the aorta at the L1 level and AP aortogram was performed. This showed what appeared to be moderate left renal artery stenosis and a patent right renal artery, left renal artery stenosis although not entirely opacified appeared to be in the 60%-70% range. The aorta and iliac segments appeared widely patent with about a 30% stenosis at most in the right common iliac artery. I then crossed the aortic bifurcation and advanced to the left femoral head and selective left lower extremity angiogram was then performed. This demonstrated a calcific distal iliac common femoral, profunda femoris, superficial femoral artery.  The common femoral and profunda femoris artery were widely patent. The SFA was patent until the distal segment where it occluded. It reconstituted at Hunter's canal but the above-knee popliteal artery was diseased down to just above the knee. The vessel then normalized in the distal popliteal artery. The tibioperoneal trunk and peroneal artery where the only runoff distally and this was somewhat diffusely diseased, although the degree of stenosis was difficult to opacify due to the proximal occlusion and the poor opacification. The patient was heparinized. A 6 French Ansell sheath was placed over a Truman advantage wire and I was able to cross through the SFA and popliteal lesions without difficulty and confirm intraluminal flow  in the popliteal artery. This showed blob in the tibioperoneal trunk and moderate stenosis of multiple segments of the peroneal artery. I treated the  peroneal artery and the tibioperoneal trunk with a 3 mm diameter angioplasty balloon. This resulted in residual flow limitation with the blob of either thrombus or plaque in the tibioperoneal trunk. It was then treated with a 4 mm diameter angioplasty balloon. I treated the majority of the peroneal artery down to its split just above the ankle with a 3 mm diameter angioplasty balloon and resulted in markedly improved patency and there was reconstitution of the posterior tibial artery at the ankle. It should be noted the 4 balloon in the tibioperoneal trunk was actually Lutonix drug-coated angioplasty balloon. I treated the above-knee popliteal artery and distal SFA with a 5 mm diameter Lutonix drug-coated angioplasty balloon.  Narrowing was seen which resolved with angioplasty. Completion angiogram following this showed the popliteal portion of the intervention to be patent with good flow, however, there were still a high-grade residual stenosis at the original occlusion entry point in the distal SFA. I treated this with a 6 mm diameter angioplasty balloon, however, the residual high-grade stenosis from the very calcific lesion remained and I elected to treat this with a self-expanding stent. A 6 mm diameter x 67 mm self-expanding stent was placed in the distal SFA, post dilated with a 6 mm balloon with an excellent angiographic completion result and markedly improved flow. There was no significant residual stenosis after stent placement. The peroneal artery and tibioperoneal trunk had less than 20% residual stenosis after angioplasty. At this point I terminated the procedure. The sheath was removed. StarClose closure device was deployed in the usual fashion with excellent hemostatic result. The patient tolerated the procedure well and was taken to the recovery room in stable condition.   ____________________________ Annice NeedyJason S. Ashyah Quizon, MD jsd:mc D: 03/14/2014 10:30:36 ET T: 03/14/2014 10:55:32  ET JOB#: [id number]  cc: Annice NeedyJason S. Ardyce Heyer, MD, <Dictator>   Annice NeedyJASON S Natalea Sutliff MD ELECTRONICALLY SIGNED 03/25/2014 17:40

## 2014-05-23 ENCOUNTER — Other Ambulatory Visit: Payer: Self-pay | Admitting: Infectious Diseases

## 2014-05-23 ENCOUNTER — Ambulatory Visit: Admission: RE | Admit: 2014-05-23 | Payer: Medicare Other | Source: Ambulatory Visit

## 2014-05-23 DIAGNOSIS — M869 Osteomyelitis, unspecified: Secondary | ICD-10-CM

## 2014-05-24 ENCOUNTER — Other Ambulatory Visit: Payer: Self-pay | Admitting: Infectious Diseases

## 2014-05-24 ENCOUNTER — Encounter: Payer: PRIVATE HEALTH INSURANCE | Admitting: Surgery

## 2014-05-24 ENCOUNTER — Ambulatory Visit
Admission: RE | Admit: 2014-05-24 | Discharge: 2014-05-24 | Disposition: A | Discharge status: Home / Self Care | Payer: Medicare Other | Source: Ambulatory Visit | Attending: Vascular Surgery | Admitting: Vascular Surgery

## 2014-05-24 DIAGNOSIS — E11621 Type 2 diabetes mellitus with foot ulcer: Secondary | ICD-10-CM | POA: Diagnosis present

## 2014-05-24 DIAGNOSIS — M869 Osteomyelitis, unspecified: Secondary | ICD-10-CM

## 2014-05-24 DIAGNOSIS — L97529 Non-pressure chronic ulcer of other part of left foot with unspecified severity: Secondary | ICD-10-CM | POA: Diagnosis present

## 2014-05-24 DIAGNOSIS — L03116 Cellulitis of left lower limb: Secondary | ICD-10-CM | POA: Insufficient documentation

## 2014-05-24 MED ORDER — VANCOMYCIN HCL IN DEXTROSE 1-5 GM/200ML-% IV SOLN: 1000.0000 mg | Freq: Once | INTRAVENOUS | Status: DC

## 2014-05-24 MED ORDER — VANCOMYCIN HCL IN DEXTROSE 1-5 GM/200ML-% IV SOLN
1000.0000 mg | Freq: Once | INTRAVENOUS | Status: DC
  Filled 2014-05-24: qty 200

## 2014-05-24 NOTE — Op Note (Signed)
Preoperative diagnosis: Cellulitis left foot with diabetic foot ulcer  Postoperative diagnosis: Same  Procedure performed insertion of a PICC line with ultrasound and fluoroscopic guidance.  Procedure performed by Levora DredgeGregory Hansini Clodfelter M.D.  Anesthesia local 1% lidocaine.  After obtaining informed consent for insertion of a PICC line the patient is brought to special procedures in his left arm is prepped and draped in a sterile fashion. Appropriate timeout was called. Ultrasound was placed in a sterile sleeve and the left upper arm is evaluated. The basilic vein is noted to be echolucent and compressible indicating patency. Images recorded for the permanent record. Under real-time visualization the basilic vein is accessed with a microneedle. A microwire was then advanced under fluoroscopy and positioned with its tip in the superior vena cava. Length measurements are made and a single-lumen 4 French PICC line is trimmed to appropriate length. Peel-away sheath was inserted and subsequently the PICC line is advanced over the wire through the peel-away sheath. Under fluoroscopy the tip was noted to be in the superior vena cava and the wire and peel-away sheath are removed.  The PICC line aspirates easily and was flushed with heparinized saline is then secured to the skin of the arm with a sterile dressing.  The PICC line measures 46 cm to the tip.  Summary successful insertion of single-lumen PICC line left arm.

## 2014-05-25 ENCOUNTER — Encounter: Payer: PRIVATE HEALTH INSURANCE | Admitting: Surgery

## 2014-05-26 ENCOUNTER — Encounter: Payer: Medicare Other | Admitting: Surgery

## 2014-05-26 ENCOUNTER — Encounter: Payer: Medicare Other | Attending: Surgery | Admitting: Surgery

## 2014-05-26 DIAGNOSIS — M869 Osteomyelitis, unspecified: Secondary | ICD-10-CM | POA: Insufficient documentation

## 2014-05-26 DIAGNOSIS — E1152 Type 2 diabetes mellitus with diabetic peripheral angiopathy with gangrene: Secondary | ICD-10-CM | POA: Insufficient documentation

## 2014-05-26 DIAGNOSIS — L97323 Non-pressure chronic ulcer of left ankle with necrosis of muscle: Secondary | ICD-10-CM | POA: Diagnosis not present

## 2014-05-26 DIAGNOSIS — E11621 Type 2 diabetes mellitus with foot ulcer: Secondary | ICD-10-CM | POA: Insufficient documentation

## 2014-05-26 DIAGNOSIS — I70244 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: Secondary | ICD-10-CM | POA: Insufficient documentation

## 2014-05-26 DIAGNOSIS — L03116 Cellulitis of left lower limb: Secondary | ICD-10-CM | POA: Diagnosis not present

## 2014-05-27 ENCOUNTER — Encounter: Payer: PRIVATE HEALTH INSURANCE | Admitting: Surgery

## 2014-05-27 NOTE — Progress Notes (Addendum)
[name], [name] ([id number]) Visit Report for 05/26/2014 Chief Complaint Document Details Patient Name: Desire, [name] Date of Service: 05/26/2014 3:30 PM Medical Record Number: [id number] Patient Account Number: [id number] Date of Birth/Sex: 1920/10/07 (81 y.o. Male) Treating RN: Kristin Bruins Primary Care Physician: Lindwood Qua Other Clinician: Referring Physician: Lindwood Qua Treating Physician/Extender: Rudene Re in Treatment: 4 Information Obtained from: Patient Chief Complaint Patient presents to the wound care center for a consult due non healing wound 81 year old gentleman who comes with a history of having some ulcerated areas on his heels since February 2016 and then a large injury to his left posterior heel and ankle since about a month. Electronic Signature(s) Signed: 05/26/2014 4:59:19 PM By: Evlyn Kanner MD, FACS Entered By: Evlyn Kanner on 05/26/2014 16:30:55 [name] ([id number]) -------------------------------------------------------------------------------- Debridement Details Patient Name: [name], [name] Date of Service: 05/26/2014 3:30 PM Medical Record Number: [id number] Patient Account Number: [id number] Date of Birth/Sex: 1920-04-18 (81 y.o. Male) Treating RN: Kristin Bruins Primary Care Physician: Lindwood Qua Other Clinician: Referring Physician: Lindwood Qua Treating Physician/Extender: Rudene Re in Treatment: 4 Debridement Performed for Wound #1 Left Achilles Assessment: Performed By: Physician Tristan Schroeder., MD Debridement: Debridement Pre-procedure Yes Verification/Time Out Taken: Start Time: 16:20 Pain Control: Lidocaine 4% Topical Solution Level: Skin/Subcutaneous Tissue Total Area Debrided (L x 6.8 (cm) x 4.9 (cm) = 33.32 (cm) W): Tissue and other Eschar, Fibrin/Slough, Ligament, Skin, Subcutaneous material debrided: Instrument: Forceps, Scissors Bleeding: Minimum Hemostasis Achieved:  Pressure End Time: 16:25 Procedural Pain: 0 Post Procedural Pain: 0 Response to Treatment: Procedure was tolerated well Post Debridement Measurements of Total Wound Length: (cm) 6.8 Width: (cm) 4.9 Depth: (cm) 1.5 Volume: (cm) 39.254 Electronic Signature(s) Signed: 05/26/2014 4:55:43 PM By: Kristin Bruins Signed: 05/26/2014 4:59:19 PM By: Evlyn Kanner MD, FACS Entered By: Evlyn Kanner on 05/26/2014 16:30:14 [name] ([id number]) -------------------------------------------------------------------------------- Debridement Details Patient Name: [name], [name] Date of Service: 05/26/2014 3:30 PM Medical Record Number: [id number] Patient Account Number: [id number] Date of Birth/Sex: 1920/07/26 (81 y.o. Male) Treating RN: Kristin Bruins Primary Care Physician: Lindwood Qua Other Clinician: Referring Physician: Lindwood Qua Treating Physician/Extender: Rudene Re in Treatment: 4 Debridement Performed for Wound #2 Left,Lateral Calcaneous Assessment: Performed By: Physician Tristan Schroeder., MD Debridement: Debridement Pre-procedure Yes Verification/Time Out Taken: Start Time: 16:18 Pain Control: Lidocaine 4% Topical Solution Level: Skin/Subcutaneous Tissue Total Area Debrided (L x 1.1 (cm) x 3.8 (cm) = 4.18 (cm) W): Tissue and other Viable, Non-Viable, Fibrin/Slough, Subcutaneous material debrided: Instrument: Forceps Bleeding: Minimum Hemostasis Achieved: Pressure End Time: 16:20 Procedural Pain: 0 Post Procedural Pain: 0 Response to Treatment: Procedure was tolerated well Post Debridement Measurements of Total Wound Length: (cm) 1.1 Width: (cm) 3.8 Depth: (cm) 0.1 Volume: (cm) 0.328 Electronic Signature(s) Signed: 05/26/2014 4:55:43 PM By: Kristin Bruins Signed: 05/26/2014 4:59:19 PM By: Evlyn Kanner MD, FACS Entered By: Evlyn Kanner on 05/26/2014 16:30:47 [name]  ([id number]) -------------------------------------------------------------------------------- HPI Details Patient Name: [name], [name] Date of Service: 05/26/2014 3:30 PM Medical Record Number: [id number] Patient Account Number: [id number] Date of Birth/Sex: 12/19/1920 (81 y.o. Male) Treating RN: Kristin Bruins Primary Care Physician: Lindwood Qua Other Clinician: Referring Physician: Lindwood Qua Treating Physician/Extender: Rudene Re in Treatment: 4 History of Present Illness HPI Description: 81 year old gentleman who was known to be a diabetic for many years has peripheral neuropathy recently went to his primary care doctor for a punctured wound on his left heel which was something he had noticed. The patient then was referred  to a podiatrist who referred him to Dr. Wyn Quaker in the vascular surgery department and I understand the procedure was done on 03/14/2014 with a left lower extremity vascular procedure and stenting was done. Details of this are not available at the present time. The patient has been applying Neosporin to his leg and has not had any wound care addressed so far. patient has also had a history of coronary artery disease in the past and hasn't had a CABG and a pacemaker placement in the remote past.Other details and notes are pending. the patient is not in pain lives alone and has some home health and other aides coming to help him with his daily chores and meals. reviewing the vascular notes I understand the procedure was done on 03/14/2014 and he was operated by Dr. Wyn Quaker. he had a catheter placement to the left peroneal artery and a aortogram and selective left lower extremity angiogram was done. He also had a percutaneous transluminal angioplasty of the left peroneal artery and the tibioperoneal trunk. The distal SFA and above-knee popliteal artery were also angioplastied. A subcutaneous stent placement to the distal superficial femoral artery was also  done. 05/05/2014 -- the patient has not had any change in his health and after much consideration is decided that he does not want HBOT as he is claustrophobic and was unable to tolerate being in the chamber for 90 minutes. I have discussed with him that his most recent x-ray of the foot shows that he has the distal phalanx of the left great toe showing changes with osteomyelitis cannot be excluded at that site. He tells me that he cannot have an MRI because of his defibrillator and hence we will order a triple phase bone scan. I have also reviewed his culture report which shows several organisms and he has sensitivity to tetracycline and we have recommended he takes this for 14 days and this has been given to him on 05/02/2014 05/12/2014 the bone scan done on 05/10/2014 shows #1 findings are worrisome for osteomyelitis involving the calcaneus of the left foot, #2 increase uptake localizing to the left second and third toe on all 3 phases. Cannot rule out osteomyelitis in this area. And #3 left foot cellulitis. 05/19/2014 -- reviewed several reports which we have received back on [facility]. #1 chest x-ray done on April 21 shows chronic changes in the left base no acute findings. #2 his CBC is within normal limits. #3 hemoglobin A1c was 8.5%. #4 EKG done on 26 April was within normal limits due to his electronic ventricular pacemaker. 05/26/2014 -- he has had his PICC line placed and is taking vancomycin daily basis. He is to start hyperbaric oxygen therapy on this coming Monday. TRAVONTE, BYARD ([id number]) Electronic Signature(s) Signed: 05/26/2014 4:59:19 PM By: Evlyn Kanner MD, FACS Entered By: Evlyn Kanner on 05/26/2014 16:31:30 [name], [name] ([id number]) -------------------------------------------------------------------------------- Physical Exam Details Patient Name: [name], [name] Date of Service: 05/26/2014 3:30 PM Medical Record Number: [id number] Patient Account Number:  [id number] Date of Birth/Sex: March 14, 1920 (81 y.o. Male) Treating RN: Kristin Bruins Primary Care Physician: Lindwood Qua Other Clinician: Referring Physician: Lindwood Qua Treating Physician/Extender: Rudene Re in Treatment: 4 Constitutional . Pulse regular. Respirations normal and unlabored. Afebrile. . Eyes Nonicteric. Reactive to light. Ears, Nose, Mouth, and Throat Lips, teeth, and gums WNL.Marland Kitchen Moist mucosa without lesions . Neck supple and nontender. No palpable supraclavicular or cervical adenopathy. Normal sized without goiter. Respiratory WNL. No retractions.. Integumentary (Hair, Skin) the left Achilles  tendon is exposed and I have debrided as much as possible and there is minimal amount of healthy granulation tissue.Marland Kitchen No crepitus or fluctuance. No peri-wound warmth or erythema. No masses.Marland Kitchen Psychiatric Judgement and insight Intact.. No evidence of depression, anxiety, or agitation.. Electronic Signature(s) Signed: 05/26/2014 4:59:19 PM By: Evlyn Kanner MD, FACS Entered By: Evlyn Kanner on 05/26/2014 16:32:48 Blumstein, [name] ([id number]) -------------------------------------------------------------------------------- Physician Orders Details Patient Name: Bunner, Deyvi L. Date of Service: 05/26/2014 3:30 PM Medical Record Number: [id number] Patient Account Number: [id number] Date of Birth/Sex: [birth year] (81 y.o. Male) Treating RN: Curtis Sites Primary Care Physician: Lindwood Qua Other Clinician: Referring Physician: Lindwood Qua Treating Physician/Extender: Rudene Re in Treatment: 4 Verbal / Phone Orders: Yes Clinician: Curtis Sites Read Back and Verified: Yes Diagnosis Coding Wound Cleansing Wound #1 Left Achilles o Clean wound with Normal Saline. o May shower with protection. Wound #2 Left,Lateral Calcaneous o Clean wound with Normal Saline. o May shower with protection. Wound #3 Left Toe Great o Clean wound with  Normal Saline. o May shower with protection. Anesthetic Wound #1 Left Achilles o Topical Lidocaine 4% cream applied to wound bed prior to debridement Wound #2 Left,Lateral Calcaneous o Topical Lidocaine 4% cream applied to wound bed prior to debridement Wound #3 Left Toe Great o Topical Lidocaine 4% cream applied to wound bed prior to debridement Skin Barriers/Peri-Wound Care Wound #1 Left Achilles o Skin Prep Wound #2 Left,Lateral Calcaneous o Skin Prep Wound #3 Left Toe Great o Skin Prep Primary Wound Dressing Wound #1 Left Achilles o Santyl Ointment [name], Demir L. ([id number]) Wound #2 Left,Lateral Calcaneous o Santyl Ointment Wound #3 Left Toe Great o Other: - betadine paint Secondary Dressing Wound #1 Left Achilles o Gauze, ABD and Kerlix/Conform Wound #2 Left,Lateral Calcaneous o Gauze, ABD and Kerlix/Conform Wound #3 Left Toe Great o Gauze, ABD and Kerlix/Conform Dressing Change Frequency Wound #1 Left Achilles o Change dressing every day. Wound #2 Left,Lateral Calcaneous o Change dressing every day. Wound #3 Left Toe Great o Change dressing every day. Follow-up Appointments Wound #1 Left Achilles o Return Appointment in 1 week. Wound #2 Left,Lateral Calcaneous o Return Appointment in 1 week. Wound #3 Left Toe Great o Return Appointment in 1 week. Additional Orders / Instructions Wound #1 Left Achilles o Increase protein intake. o Other: - goal is to keep blood sugars under 180 Wound #2 Left,Lateral Calcaneous o Increase protein intake. o Other: - goal is to keep blood sugars under 180 Wound #3 Left Toe Great o Increase protein intake. YUTAKA, HOLBERG ([id number]) o Other: - goal is to keep blood sugars under 180 Home Health Wound #1 Left Achilles o Continue Home Health Visits - [facility] o Home Health Nurse may visit PRN to address patientos wound care needs. o FACE TO FACE  ENCOUNTER: MEDICARE and MEDICAID PATIENTS: I certify that this patient is under my care and that I had a face-to-face encounter that meets the physician face-to-face encounter requirements with this patient on this date. The encounter with the patient was in whole or in part for the following MEDICAL CONDITION: (primary reason for Home Healthcare) MEDICAL NECESSITY: I certify, that based on my findings, NURSING services are a medically necessary home health service. HOME BOUND STATUS: I certify that my clinical findings support that this patient is homebound (i.e., Due to illness or injury, pt requires aid of supportive devices such as crutches, cane, wheelchairs, walkers, the use of special transportation or the assistance of another person to leave  their place of residence. There is a normal inability to leave the home and doing so requires considerable and taxing effort. Other absences are for medical reasons / religious services and are infrequent or of short duration when for other reasons). o If current dressing causes regression in wound condition, may D/C ordered dressing product/s and apply Normal Saline Moist Dressing daily until next Wound Healing Center / Other MD appointment. Notify Wound Healing Center of regression in wound condition at [phone]. o Please direct any NON-WOUND related issues/requests for orders to patient's Primary Care Physician Wound #2 Left,Lateral Calcaneous o [facility] o Home Health Nurse may visit PRN to address patientos wound care needs. o FACE TO FACE ENCOUNTER: MEDICARE and MEDICAID PATIENTS: I certify that this patient is under my care and that I had a face-to-face encounter that meets the physician face-to-face encounter requirements with this patient on this date. The encounter with the patient was in whole or in part for the following MEDICAL CONDITION: (primary reason for Home  Healthcare) MEDICAL NECESSITY: I certify, that based on my findings, NURSING services are a medically necessary home health service. HOME BOUND STATUS: I certify that my clinical findings support that this patient is homebound (i.e., Due to illness or injury, pt requires aid of supportive devices such as crutches, cane, wheelchairs, walkers, the use of special transportation or the assistance of another person to leave their place of residence. There is a normal inability to leave the home and doing so requires considerable and taxing effort. Other absences are for medical reasons / religious services and are infrequent or of short duration when for other reasons). o If current dressing causes regression in wound condition, may D/C ordered dressing product/s and apply Normal Saline Moist Dressing daily until next Wound Healing Center / Other MD appointment. Notify Wound Healing Center of regression in wound condition at [phone]. o Please direct any NON-WOUND related issues/requests for orders to patient's Primary Care Physician Wound #3 Left Toe Doretha SouGreat o Continue Home Health Visits - [facility] o Home Health Nurse may visit PRN to address patientos wound care needs. [name], [name] ([id number]) o FACE TO FACE ENCOUNTER: MEDICARE and MEDICAID PATIENTS: I certify that this patient is under my care and that I had a face-to-face encounter that meets the physician face-to-face encounter requirements with this patient on this date. The encounter with the patient was in whole or in part for the following MEDICAL CONDITION: (primary reason for Home Healthcare) MEDICAL NECESSITY: I certify, that based on my findings, NURSING services are a medically necessary home health service. HOME BOUND STATUS: I certify that my clinical findings support that this patient is homebound (i.e., Due to illness or injury, pt requires aid of supportive devices such as crutches, cane, wheelchairs,  walkers, the use of special transportation or the assistance of another person to leave their place of residence. There is a normal inability to leave the home and doing so requires considerable and taxing effort. Other absences are for medical reasons / religious services and are infrequent or of short duration when for other reasons). o If current dressing causes regression in wound condition, may D/C ordered dressing product/s and apply Normal Saline Moist Dressing daily until next Wound Healing Center / Other MD appointment. Notify Wound Healing Center of regression in wound condition at [phone]. o Please direct any NON-WOUND related issues/requests for orders to patient's Primary Care Physician Hyperbaric Oxygen Therapy Wound #3 Left Toe  Great o Indication: - osteomyelitis o If appropriate for treatment, begin HBOT per protocol: o 2.0 ATA for 90 Minutes without Air Breaks o One treatment per day (delivered Monday through Friday unless otherwise specified in Special Instructions below): o Total # of Treatments: - 40 o Finger stick Blood Glucose Pre- and Post- HBOT Treatment. o Follow Hyperbaric Oxygen Glycemia Protocol HBO Contraindications Wound #3 Left Toe Great - Left Lower Extremity o HBO contraindications of hyperbaric oxygen therapy were reviewed and the patient found to have no untreated pneumothorax or history of spontaneous pneumothorax. o HBO contraindications of hyperbaric oxygen therapy were reviewed and the patient found to have no history of medications such as Bleomycin, Adriamycin, disulfiram, cisplatin and sulfamylon and is not currently receiving any chemotherapy. o HBO contraindications of hyperbaric oxygen therapy were reviewed and the patient found to have no Upper respiratory infection and chronic sinusitis. o HBO contraindications of hyperbaric oxygen therapy were reviewed and the patient found to have no history of retinal  surgery proceeding 6 weeks or intraocular gas o HBO contraindications of hyperbaric oxygen therapy were reviewed and the patient found to have no history seizure disorder or any anticonvulsant medication. o HBO contraindications of hyperbaric oxygen therapy were reviewed and the patient found to have no septicemia with CO2 retention. o HBO contraindications of hyperbaric oxygen therapy were reviewed and the patient found to have no fever greater than 100 degrees. o HBO contraindications of hyperbaric oxygen therapy were reviewed and the patient found to have no pregnancy noted. [name], [name] ([id number]) o HBO contraindications of hyperbaric oxygen therapy were reviewed and the patient found to have no medications such as steroids or narcotics or Phenergan. Medications-please add to medication list. Wound #1 Left Achilles - Left Lower Extremity o Santyl Enzymatic Ointment Wound #2 Left,Lateral Calcaneous - Left Lower Extremity o Santyl Enzymatic Ointment GLYCEMIA INTERVENTIONS PROTOCOL PRE-HBO GLYCEMIA INTERVENTIONS ACTION INTERVENTION Obtain pre-HBO capillary blood 1 glucose (ensure physician order is in chart). A. Notify HBO physician and await physician orders. 2 If result is 70 mg/dl or below: B. If the result meets the hospital definition of a critical result, follow hospital policy. A. Give patient an 8 ounce Glucerna Shake, an 8 ounce Ensure, or 8 ounces of a Glucerna/Ensure equivalent dietary supplement*. B. Wait 30 minutes. If result is 71 mg/dl to 409 mg/dl: C. Retest patientos capillary blood glucose (CBG). D. If result greater than or equal to 110 mg/dl, proceed with HBO. If result less than 110 mg/dl, notify HBO physician and consider holding HBO. If result is 131 mg/dl to 811 mg/dl: A. Proceed with HBO. A. Notify HBO physician and await physician orders. B. It is recommended to hold HBO and do blood/urine ketone If result is 250 mg/dl or  greater: testing. C. If the result meets the hospital definition of a critical result, follow hospital policy. POST-HBO GLYCEMIA INTERVENTIONS ACTION INTERVENTION Obtain post HBO capillary blood 1 glucose (ensure physician order is in chart). Fanara, Tymarion L. ([id number]) 2 If result is 70 mg/dl or below: A. Notify HBO physician and await physician orders. B. If the result meets the hospital definition of a critical result, follow hospital policy. A. Give patient an 8 ounce Glucerna Shake, an 8 ounce Ensure, or 8 ounces of a Glucerna/Ensure equivalent dietary supplement*. B. Wait 15 minutes for symptoms of hypoglycemia (i.e. nervousness, anxiety, If result is 71 mg/dl to 213 mg/dl: sweating, chills, clamminess, irritability, confusion, tachycardia or dizziness). C. If patient asymptomatic, discharge patient. If patient symptomatic,  repeat capillary blood glucose (CBG) and notify HBO physician. If result is 101 mg/dl to 191249 mg/dl: A. Discharge patient. A. Notify HBO physician and await physician orders. B. It is recommended to do If result is 250 mg/dl or greater: blood/urine ketone testing. C. If the result meets the hospital definition of a critical result, follow hospital policy. *Juice or candies are NOT equivalent products. If patient refuses the Glucerna or Ensure, please consult the hospital dietitian for an appropriate substitute. Electronic Signature(s) Signed: 05/30/2014 9:33:45 AM By: Curtis Sitesorthy, Joanna Signed: 05/30/2014 5:02:53 PM By: Evlyn KannerBritto, Bralynn Donado MD, FACS Previous Signature: 05/26/2014 4:59:19 PM Version By: Evlyn KannerBritto, Miosotis Wetsel MD, FACS Previous Signature: 05/26/2014 5:45:27 PM Version By: Curtis Sitesorthy, Joanna Entered By: Curtis Sitesorthy, Joanna on 05/30/2014 09:33:43 [name], [name] ([id number]) -------------------------------------------------------------------------------- Problem List Details Patient Name: Wilczak, Alistair L. Date of Service: 05/26/2014 3:30 PM Medical Record  Number: [id number] Patient Account Number: [id number] Date of Birth/Sex: 1920-02-25 36(81 y.o. 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 ([id number]) -------------------------------------------------------------------------------- Progress Note Details Patient Name: [name], [name] Date of Service: 05/26/2014 3:30 PM Medical Record Number: [id number] Patient Account Number: [id number] Date of Birth/Sex: 1920-02-25 37(81 y.o. Male) Treating RN: Kristin Bruinsanneker, Nancy Primary Care Physician: [name], [name] Other Clinician: Referring Physician: [name], [name] Treating Physician/Extender: Rudene ReBritto, Loreda Silverio Weeks in Treatment: 4 Subjective Chief Complaint Information obtained from Patient Patient presents to the wound care center for a consult due non healing wound 81 year old gentleman who comes with a history of having some ulcerated areas on his heels since February 2016 and then a large injury to his left posterior heel and ankle since about a month. History of Present Illness (HPI) 81 year old gentleman who was known to be a diabetic for many years has peripheral neuropathy  recently went to his primary care doctor for a punctured wound on his left heel which was something he had noticed. The patient then was referred to a podiatrist who referred him to Dr. Wyn Quakerew in the vascular surgery department and I understand the procedure was done on 03/14/2014 with a left lower extremity vascular procedure and stenting was done. Details of this are not available at the present time. The patient has been applying Neosporin to his leg and has not had any wound care addressed so far. patient has also had a history of coronary artery disease in the past and hasn't had a CABG and a pacemaker placement in the remote past.Other details and notes are pending. the patient is not in pain lives alone and has some home health and other aides coming to help him with his daily chores and meals. reviewing the vascular notes I understand the procedure was done on 03/14/2014 and he was operated by Dr. Wyn QuakerEW. he had a catheter placement to the left peroneal artery and a aortogram and selective left lower extremity angiogram was done. He also had  a percutaneous transluminal angioplasty of the left peroneal artery and the tibioperoneal trunk. The distal SFA and above-knee popliteal artery were also angioplastied. A subcutaneous stent placement to the distal superficial femoral artery was also done. 05/05/2014 -- the patient has not had any change in his health and after much consideration is decided that he does not want HBOT as he is claustrophobic and was unable to tolerate being in the chamber for 90 minutes. I have discussed with him that his most recent x-ray of the foot shows that he has the distal phalanx of the left great toe showing changes with osteomyelitis cannot be excluded at that site. He tells me that he cannot have an MRI because of his defibrillator and hence we will order a triple phase bone scan. I have also reviewed his culture report which shows several organisms and he has  sensitivity to tetracycline and we have recommended he takes this for 14 days and this has been given to him on 05/02/2014 05/12/2014 the bone scan done on 05/10/2014 shows #1 findings are worrisome for osteomyelitis involving the calcaneus of the left foot, #2 increase uptake localizing to the left second and third toe on all 3 phases. Cannot rule out osteomyelitis in this area. And #3 left foot cellulitis. 05/19/2014 -- reviewed several reports which we have received back on [facility] Coom[facility]. #1 chest x-ray done on April 21 shows chronic changes in the left base no acute findings. Potts, Atari L. ([id number]) #2 his CBC is within normal limits. #3 hemoglobin A1c was 8.5%. #4 EKG done on 26 April was within normal limits due to his electronic ventricular pacemaker. 05/26/2014 -- he has had his PICC line placed and is taking vancomycin daily basis. He is to start hyperbaric oxygen therapy on this coming Monday. Objective Constitutional Pulse regular. Respirations normal and unlabored. Afebrile. Vitals Time Taken: 3:45 PM, Height: 70 in, Weight: 200 lbs, BMI: 28.7, Temperature: 97.8 F, Pulse: 88 bpm, Respiratory Rate: 18 breaths/min, Blood Pressure: 135/55 mmHg. Eyes Nonicteric. Reactive to light. Ears, Nose, Mouth, and Throat Lips, teeth, and gums WNL.Marland Kitchen Moist mucosa without lesions . Neck supple and nontender. No palpable supraclavicular or cervical adenopathy. Normal sized without goiter. Respiratory WNL. No retractions.Marland Kitchen Psychiatric Judgement and insight Intact.. No evidence of depression, anxiety, or agitation.. Integumentary (Hair, Skin) the left Achilles tendon is exposed and I have debrided as much as possible and there is minimal amount of healthy granulation tissue.Marland Kitchen No crepitus or fluctuance. No peri-wound warmth or erythema. No masses.. Wound #1 status is Open. Original cause of wound was Gradually Appeared. The wound is located on the Left Achilles. The wound measures 6.8cm  length x 4.9cm width x 1.5cm depth; 26.169cm^2 area and 39.254cm^3 volume. There is tendon exposed. There is no tunneling or undermining noted. There is a medium amount of serous drainage noted. The wound margin is well defined and not attached to the wound base. There is small (1-33%) pink granulation within the wound bed. There is a large (67-100%) amount of necrotic tissue within the wound bed including Eschar and Adherent Slough. The periwound skin appearance exhibited: Erythema. The periwound skin appearance did not exhibit: Callus, Crepitus, Excoriation, Fluctuance, Friable, Induration, Localized Edema, Rash, Scarring, Dry/Scaly, Maceration, Novitski, Jaiceon L. ([id number]) Moist, Atrophie Blanche, Cyanosis, Ecchymosis, Hemosiderin Staining, Mottled, Pallor, Rubor. The surrounding wound skin color is noted with erythema which is circumferential. Periwound temperature was noted as No Abnormality. Wound #2 status is Open. Original cause of wound was Gradually Appeared. The wound  is located on the Left,Lateral Calcaneous. The wound measures 1.1cm length x 3.8cm width x 0.1cm depth; 3.283cm^2 area and 0.328cm^3 volume. The wound is limited to skin breakdown. There is no tunneling or undermining noted. There is a small amount of serous drainage noted. The wound margin is distinct with the outline attached to the wound base. There is small (1-33%) pink granulation within the wound bed. There is a medium (34-66%) amount of necrotic tissue within the wound bed including Adherent Slough. The periwound skin appearance did not exhibit: Callus, Crepitus, Excoriation, Fluctuance, Friable, Induration, Localized Edema, Rash, Scarring, Dry/Scaly, Maceration, Moist, Atrophie Blanche, Cyanosis, Ecchymosis, Hemosiderin Staining, Mottled, Pallor, Rubor, Erythema. Periwound temperature was noted as No Abnormality. The periwound has tenderness on palpation. Wound #3 status is Open. Original cause of wound was  Gradually Appeared. The wound is located on the Left Toe Great. The wound measures 1cm length x 1.2cm width x 0.1cm depth; 0.942cm^2 area and 0.094cm^3 volume. The wound is limited to skin breakdown. There is a none present amount of drainage noted. The wound margin is distinct with the outline attached to the wound base. There is no granulation within the wound bed. There is a large (67-100%) amount of necrotic tissue within the wound bed including Eschar. The periwound skin appearance exhibited: Erythema. The periwound skin appearance did not exhibit: Callus, Crepitus, Excoriation, Fluctuance, Friable, Induration, Localized Edema, Rash, Scarring, Dry/Scaly, Maceration, Moist, Atrophie Blanche, Cyanosis, Ecchymosis, Hemosiderin Staining, Mottled, Pallor, Rubor. The surrounding wound skin color is noted with erythema which is circumferential. Periwound temperature was noted as No Abnormality. 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 we'll continue with Santyl ointment locally and apply a appropriate dressing over this. Bolin, Ramez L. ([id number]) He will see me back next week. 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 There was a Skin/Subcutaneous Tissue Debridement (40981-19147) debridement with total area of 33.32 sq cm performed by Cheikh Bramble, Ignacia Felling., MD. with the following instrument(s): Forceps and Scissors including Fibrin/Slough, Ligament, Eschar, Skin,  and Subcutaneous after achieving pain control using Lidocaine 4% Topical Solution. A time out was conducted prior to the start of the procedure. A Minimum amount of bleeding was controlled with Pressure. The procedure was tolerated well with a pain level of 0 throughout and a pain level of 0 following the procedure. Post Debridement Measurements: 6.8cm length x 4.9cm width x 1.5cm depth; 39.254cm^3 volume. Wound #2 Wound #2 is an Arterial Insufficiency Ulcer located on the Left,Lateral Calcaneous . There was a Skin/Subcutaneous Tissue Debridement ([id number]) debridement with total area of 4.18 sq cm performed by Sevon Rotert, Ignacia Felling., MD. with the following instrument(s): Forceps to remove Viable and Non- Viable tissue/material including Fibrin/Slough and Subcutaneous after achieving pain control using Lidocaine 4% Topical Solution. A time out was conducted prior to the start of the procedure. A Minimum amount of bleeding was controlled with Pressure. The procedure was tolerated well with a pain level of 0 throughout and a pain level of 0 following the procedure. Post Debridement Measurements: 1.1cm length  x 3.8cm width x 0.1cm depth; 0.328cm^3 volume. Plan Wound Cleansing: Wound #1 Left Achilles: Clean wound with Normal Saline. May shower with protection. Wound #2 Left,Lateral Calcaneous: Clean wound with Normal Saline. May shower with protection. Wound #3 Left Toe Great: Clean wound with Normal Saline. May shower with protection. Anesthetic: Wound #1 Left Achilles: Topical Lidocaine 4% cream applied to wound bed prior to debridement Wound #2 Left,Lateral Calcaneous: Topical Lidocaine 4% cream applied to wound bed prior to debridement Wound #3 Left Toe Great: Topical Lidocaine 4% cream applied to wound bed prior to debridement Skin Barriers/Peri-Wound Care: Wound #1 Left Achilles: Royer, Ozias L. ([id number]) Skin Prep Wound #2 Left,Lateral Calcaneous: Skin Prep Wound #3 Left Toe  Great: Skin Prep Primary Wound Dressing: Wound #1 Left Achilles: Santyl Ointment Wound #2 Left,Lateral Calcaneous: Santyl Ointment Wound #3 Left Toe Great: Other: - betadine paint Secondary Dressing: Wound #1 Left Achilles: Gauze, ABD and Kerlix/Conform Wound #2 Left,Lateral Calcaneous: Gauze, ABD and Kerlix/Conform Wound #3 Left Toe Great: Gauze, ABD and Kerlix/Conform Dressing Change Frequency: Wound #1 Left Achilles: Change dressing every day. Wound #2 Left,Lateral Calcaneous: Change dressing every day. Wound #3 Left Toe Great: Change dressing every day. Follow-up Appointments: Wound #1 Left Achilles: Return Appointment in 1 week. Wound #2 Left,Lateral Calcaneous: Return Appointment in 1 week. Wound #3 Left Toe Great: Return Appointment in 1 week. Additional Orders / Instructions: Wound #1 Left Achilles: Increase protein intake. Other: - goal is to keep blood sugars under 180 Wound #2 Left,Lateral Calcaneous: Increase protein intake. Other: - goal is to keep blood sugars under 180 Wound #3 Left Toe Great: Increase protein intake. Other: - goal is to keep blood sugars under 180 Home Health: Wound #1 Left Achilles: Continue Home Health Visits - [facility] Nurse may visit PRN to address patient s wound care needs. FACE TO FACE ENCOUNTER: MEDICARE and MEDICAID PATIENTS: I certify that this patient is under my care and that I had a face-to-face encounter that meets the physician face-to-face encounter requirements with this patient on this date. The encounter with the patient was in whole or in part for the following MEDICAL CONDITION: (primary reason for Home Healthcare) MEDICAL NECESSITY: I certify, TIELER, COURNOYER ([id number]) that based on my findings, NURSING services are a medically necessary home health service. HOME BOUND STATUS: I certify that my clinical findings support that this patient is homebound (i.e., Due to illness or injury, pt  requires aid of supportive devices such as crutches, cane, wheelchairs, walkers, the use of special transportation or the assistance of another person to leave their place of residence. There is a normal inability to leave the home and doing so requires considerable and taxing effort. Other absences are for medical reasons / religious services and are infrequent or of short duration when for other reasons). If current dressing causes regression in wound condition, may D/C ordered dressing product/s and apply Normal Saline Moist Dressing daily until next Wound Healing Center / Other MD appointment. Notify Wound Healing Center of regression in wound condition at [phone]. Please direct any NON-WOUND related issues/requests for orders to patient's Primary Care Physician Wound #2 Left,Lateral Calcaneous: Continue Home Health Visits - [facility] Nurse may visit PRN to address patient s wound care needs. FACE TO FACE ENCOUNTER: MEDICARE and MEDICAID PATIENTS: I certify that this patient is under my care and that I had a face-to-face encounter that meets the physician face-to-face encounter requirements with this patient on this date.  The encounter with the patient was in whole or in part for the following MEDICAL CONDITION: (primary reason for Home Healthcare) MEDICAL NECESSITY: I certify, that based on my findings, NURSING services are a medically necessary home health service. HOME BOUND STATUS: I certify that my clinical findings support that this patient is homebound (i.e., Due to illness or injury, pt requires aid of supportive devices such as crutches, cane, wheelchairs, walkers, the use of special transportation or the assistance of another person to leave their place of residence. There is a normal inability to leave the home and doing so requires considerable and taxing effort. Other absences are for medical reasons / religious services and are infrequent or of short  duration when for other reasons). If current dressing causes regression in wound condition, may D/C ordered dressing product/s and apply Normal Saline Moist Dressing daily until next Wound Healing Center / Other MD appointment. Notify Wound Healing Center of regression in wound condition at [phone]. Please direct any NON-WOUND related issues/requests for orders to patient's Primary Care Physician Wound #3 Left Toe Great: Continue Home Health Visits - [facility] Nurse may visit PRN to address patient s wound care needs. FACE TO FACE ENCOUNTER: MEDICARE and MEDICAID PATIENTS: I certify that this patient is under my care and that I had a face-to-face encounter that meets the physician face-to-face encounter requirements with this patient on this date. The encounter with the patient was in whole or in part for the following MEDICAL CONDITION: (primary reason for Home Healthcare) MEDICAL NECESSITY: I certify, that based on my findings, NURSING services are a medically necessary home health service. HOME BOUND STATUS: I certify that my clinical findings support that this patient is homebound (i.e., Due to illness or injury, pt requires aid of supportive devices such as crutches, cane, wheelchairs, walkers, the use of special transportation or the assistance of another person to leave their place of residence. There is a normal inability to leave the home and doing so requires considerable and taxing effort. Other absences are for medical reasons / religious services and are infrequent or of short duration when for other reasons). If current dressing causes regression in wound condition, may D/C ordered dressing product/s and apply Normal Saline Moist Dressing daily until next Wound Healing Center / Other MD appointment. Notify Wound Healing Center of regression in wound condition at [phone]. Please direct any NON-WOUND related issues/requests for orders to patient's  Primary Care Physician Hyperbaric Oxygen Therapy: Wound #3 Left Toe Great: Indication: - osteomyelitis If appropriate for treatment, begin HBOT per protocol: 2.0 ATA for 90 Minutes without Air Breaks One treatment per day (delivered Monday through Friday unless otherwise specified in Special Instructions below): Mathew, Raquon L. ([id number]) Total # of Treatments: - 40 Finger stick Blood Glucose Pre- and Post- HBOT Treatment. Follow Hyperbaric Oxygen Glycemia Protocol HBO Contraindications: Wound #3 Left Toe Great: HBO contraindications of hyperbaric oxygen therapy were reviewed and the patient found to have no untreated pneumothorax or history of spontaneous pneumothorax. HBO contraindications of hyperbaric oxygen therapy were reviewed and the patient found to have no history of medications such as Bleomycin, Adriamycin, disulfiram, cisplatin and sulfamylon and is not currently receiving any chemotherapy. HBO contraindications of hyperbaric oxygen therapy were reviewed and the patient found to have no Upper respiratory infection and chronic sinusitis. HBO contraindications of hyperbaric oxygen therapy were reviewed and the patient found to have no history of retinal surgery proceeding 6 weeks or intraocular gas HBO contraindications of  hyperbaric oxygen therapy were reviewed and the patient found to have no history seizure disorder or any anticonvulsant medication. HBO contraindications of hyperbaric oxygen therapy were reviewed and the patient found to have no septicemia with CO2 retention. HBO contraindications of hyperbaric oxygen therapy were reviewed and the patient found to have no fever greater than 100 degrees. HBO contraindications of hyperbaric oxygen therapy were reviewed and the patient found to have no pregnancy noted. HBO contraindications of hyperbaric oxygen therapy were reviewed and the patient found to have no medications such as steroids or narcotics or  Phenergan. Medications-please add to medication list.: Wound #1 Left Achilles: Santyl Enzymatic Ointment Wound #2 Left,Lateral Calcaneous: Santyl Enzymatic Ointment the patient is making slow progress and will benefit greatly from the IV antibiotics and from the hyperbaric oxygen therapy. we'll continue with Santyl ointment locally and apply a appropriate dressing over this. He will see me back next week. Electronic Signature(s) Signed: 05/31/2014 4:43:51 PM By: Evlyn Kanner MD, FACS Previous Signature: 05/26/2014 4:59:19 PM Version By: Evlyn Kanner MD, FACS Entered By: Evlyn Kanner on 05/31/2014 16:43:27 [name], [name] ([id number]) -------------------------------------------------------------------------------- SuperBill Details Patient Name: Senn, Damare L. Date of Service: 05/26/2014 Medical Record Number: [id number] Patient Account Number: [id number] Date of Birth/Sex: September 22, 1920 (81 y.o. 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 M86.072 Acute hematogenous osteomyelitis, left ankle and foot Facility Procedures CPT4: Description Modifier Quantity Code [id number] 11042 - DEB SUBQ TISSUE 20 SQ CM/< 1 ICD-10 Description Diagnosis E11.621 Type 2 diabetes mellitus with foot ulcer I70.244 Atherosclerosis of native arteries of left leg with ulceration of heel and  midfoot 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 CPT4: [id number] 11045 - DEB SUBQ TISS EA ADDL 20CM 1 ICD-10  Description Diagnosis E11.621 Type 2 diabetes mellitus with foot ulcer 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

## 2014-05-27 NOTE — Progress Notes (Signed)
[name] ([id number]) Visit Report for 05/26/2014 Arrival Information Details Patient Name: [name], [name]. Date of Service: 05/26/2014 3:30 PM Medical Record Number: [id number] Patient Account Number: [id number] Date of Birth/Sex: May 20, 1920 (81 y.o. Male) Treating RN: Kristin Bruins Primary Care Physician: Lindwood Qua Other Clinician: Referring Physician: Lindwood Qua Treating Physician/Extender: Rudene Re in Treatment: 4 Visit Information History Since Last Visit All ordered tests and consults were completed: Yes Patient Arrived: Wheel Chair Added or deleted any medications: Yes Arrival Time: 15:50 Any new allergies or adverse reactions: No Accompanied By: son Had a fall or experienced change in No activities of daily living that may affect Transfer Assistance: None risk of falls: Patient Identification Verified: Yes Signs or symptoms of abuse/neglect since last No Secondary Verification Process Yes visito Completed: Hospitalized since last visit: No Patient Requires Transmission-Based No Has Dressing in Place as Prescribed: Yes Precautions: Pain Present Now: No Patient Has Alerts: No Electronic Signature(s) Signed: 05/26/2014 4:11:43 PM By: Kristin Bruins Entered By: Kristin Bruins on 05/26/2014 15:52:07 Arrona, Mitchel Elbert Ewings ([id number]) -------------------------------------------------------------------------------- Encounter Discharge Information Details Patient Name: [name], [name] Date of Service: 05/26/2014 3:30 PM Medical Record Number: [id number] Patient Account Number: [id number] Date of Birth/Sex: 1920/10/22 (81 y.o. Male) Treating RN: Kristin Bruins Primary Care Physician: Lindwood Qua Other Clinician: Referring Physician: Lindwood Qua Treating Physician/Extender: Rudene Re in Treatment: 4 Encounter Discharge Information Items Discharge Pain Level: 0 Discharge Condition: Stable Ambulatory Status: Wheelchair Discharge  Destination: Home Transportation: Private Auto Accompanied By: son Schedule Follow-up Appointment: Yes Medication Reconciliation completed No and provided to Patient/Care Joshuwa Vecchio: Provided on Clinical Summary of Care: 05/26/2014 Form Type Recipient Paper Patient AE Electronic Signature(s) Signed: 05/26/2014 4:55:43 PM By: Kristin Bruins Previous Signature: 05/26/2014 4:42:59 PM Version By: Gwenlyn Perking Entered By: Kristin Bruins on 05/26/2014 16:48:58 Campos, Leeandre Elbert Ewings ([id number]) -------------------------------------------------------------------------------- Lower Extremity Assessment Details Patient Name: [name], [name] Date of Service: 05/26/2014 3:30 PM Medical Record Number: [id number] Patient Account Number: [id number] Date of Birth/Sex: [birth year] (81 y.o. Male) Treating RN: Kristin Bruins Primary Care Physician: Lindwood Qua Other Clinician: Referring Physician: Lindwood Qua Treating Physician/Extender: Rudene Re in Treatment: 4 Edema Assessment Assessed: [Left: No] [Right: No] E[Left: dema] [Right: :] Calf Left: Right: Point of Measurement: 34 cm From Medial Instep 40 cm cm Ankle Left: Right: Point of Measurement: 12 cm From Medial Instep 24.5 cm cm Vascular Assessment Claudication: Claudication Assessment [Left:None] Pulses: Posterior Tibial Palpable: [Left:No] Doppler: [Left:Multiphasic] Dorsalis Pedis Palpable: [Left:No] Doppler: [Left:Multiphasic] Extremity colors, hair growth, and conditions: Extremity Color: [Left:Red] Hair Growth on Extremity: [Left:No] Temperature of Extremity: [Left:Warm] Capillary Refill: [Left:< 3 seconds] Lipodermatosclerosis: [Left:No] Toe Nail Assessment Left: Right: Thick: Yes Discolored: Yes Improper Length and Hygiene: Yes RENNER, SEBALD ([id number]) Electronic Signature(s) Signed: 05/26/2014 4:11:43 PM By: Kristin Bruins Entered By: Kristin Bruins on 05/26/2014 16:00:34 Baggerly, Burnham Elbert Ewings  ([id number]) -------------------------------------------------------------------------------- Multi Wound Chart Details Patient Name: [name], [name] Date of Service: 05/26/2014 3:30 PM Medical Record Number: [id number] Patient Account Number: [id number] Date of Birth/Sex: [birth year] (81 y.o. 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  Arterial Insufficiency Ulcer] Comorbid History: [1:Cataracts, Chronic sinus Cataracts, Chronic sinus Cataracts, Chronic sinus problems/congestion, Congestive Heart Failure, Congestive Heart Failure, Congestive Heart Failure, Coronary Artery Disease, Coronary Artery Disease, Coronary  Artery Disease, Hypertension, Myocardial Hypertension, Myocardial Hypertension, Myocardial Infarction, Peripheral Arterial Disease, Type II Arterial Disease, Type II Arterial Disease, Type II Diabetes, Neuropathy] [2:problems/congestion, Infarction,  Peripheral Diabetes, Neuropathy] [3:problems/congestion, Infarction, Peripheral Diabetes, Neuropathy] Date Acquired: [1:01/24/2014] [2:01/24/2014] [3:01/24/2014] Weeks of Treatment: [1:4] [2:4] [3:4] Wound Status: [1:Open] [2:Open] [3:Open] Clustered Wound: [1:No] [2:Yes] [3:No] Pending Amputation on Yes [2:No] [3:No] Presentation: Measurements L x W x D 6.8x4.9x1.5 [2:1.1x3.8x0.1] [3:1x1.2x0.1] (cm) Area (cm) : [1:26.169]  [2:3.283] [3:0.942] Volume (cm) : [1:39.254] [2:0.328] [3:0.094] % Reduction in Area: [1:-5.80%] [2:41.90%] [3:-9.00%] % Reduction in Volume: -429.00% [2:41.90%] [3:-9.30%] Classification: [1:Full Thickness With Exposed Support Structures] [2:Full Thickness Without Exposed Support Structures] [3:Unclassifiable] HBO Classification: [1:Grade 3] [2:Grade 1] [3:Grade 0] Wagner Verification: [1:Abscess] [2:N/A] [3:N/A] Exudate Amount: [1:Medium] [2:Small] [3:None Present] Exudate Type: Serous Serous N/A Exudate Color: amber amber N/A Wound Margin: Well defined, not attached Distinct, outline attached Distinct, outline attached Granulation Amount: Small (1-33%) Small (1-33%) None Present (0%) Granulation Quality: Pink Pink N/A Necrotic Amount: Large (67-100%) Medium (34-66%) Large (67-100%) Necrotic Tissue: Eschar, Adherent Slough Adherent Slough Eschar Exposed Structures: Tendon: Yes Fascia: No Fascia: No Fascia: No Fat: No Fat: No Fat: No Tendon: No Tendon: No Muscle: No Muscle: No Muscle: No Joint: No Joint: No Joint: No Bone: No Bone: No Bone: No Limited to Skin Limited to Skin Breakdown Breakdown Epithelialization: None None None Periwound Skin Texture: Edema: No Edema: No Edema: No Excoriation: No Excoriation: No Excoriation: No Induration: No Induration: No Induration: No Callus: No Callus: No Callus: No Crepitus: No Crepitus: No Crepitus: No Fluctuance: No Fluctuance: No Fluctuance: No Friable: No Friable: No Friable: No Rash: No Rash: No Rash: No Scarring: No Scarring: No Scarring: No Periwound Skin Maceration: No Maceration: No Maceration: No Moisture: Moist: No Moist: No Moist: No Dry/Scaly: No Dry/Scaly: No Dry/Scaly: No Periwound Skin Color: Erythema: Yes Atrophie Blanche: No Erythema: Yes Atrophie Blanche: No Cyanosis: No Atrophie Blanche: No Cyanosis: No Ecchymosis: No Cyanosis: No Ecchymosis: No Erythema: No Ecchymosis:  No Hemosiderin Staining: No Hemosiderin Staining: No Hemosiderin Staining: No Mottled: No Mottled: No Mottled: No Pallor: No Pallor: No Pallor: No Rubor: No Rubor: No Rubor: No Erythema Location: Circumferential N/A Circumferential Erythema Change: No Change N/A N/A Temperature: No Abnormality No Abnormality No Abnormality Tenderness on No Yes No Palpation: Wound Preparation: Ulcer Cleansing: Ulcer Cleansing: Ulcer Cleansing: Rinsed/Irrigated with Rinsed/Irrigated with Rinsed/Irrigated with Saline Saline Saline Topical Anesthetic Topical Anesthetic Topical Anesthetic Applied: Other: lidocaine Applied: Other: idocaine Applied: Other: 4% 4% LIDOCAINE 4% Treatment Notes [name], Theodoro L. ([id number]) Electronic Signature(s) Signed: 05/26/2014 5:45:27 PM By: Curtis Sitesorthy, Joanna Entered By: Curtis Sitesorthy, Joanna on 05/26/2014 16:18:54 Fancher, Raoul [name] ([id number]) -------------------------------------------------------------------------------- Multi-Disciplinary Care Plan Details Patient Name: Sunga, Carleton L. Date of Service: 05/26/2014 3:30 PM Medical Record Number: [id number] Patient Account Number: [id number] Date of Birth/Sex: 1920/09/18 12(81 y.o. Male) Treating RN: Curtis Sitesorthy, Joanna Primary Care Physician: [name], [name] Other Clinician: Referring Physician: [name], [name] Treating Physician/Extender: Rudene ReBritto, Errol Weeks in Treatment: 4 Active Inactive Abuse / Safety / Falls / Self Care Management Nursing Diagnoses: Potential for falls Goals: Patient will remain injury free Date Initiated: 04/28/2014 Goal Status: Active Interventions: Assess fall risk on admission and as needed Notes: Necrotic Tissue Nursing Diagnoses: Impaired tissue integrity related to necrotic/devitalized tissue Goals: Necrotic/devitalized tissue will be minimized in the wound  bed Date Initiated: 04/28/2014 Goal Status: Active Interventions: Provide education on necrotic tissue and debridement process Treatment  Activities: Apply topical anesthetic as ordered : 05/26/2014 Notes: Nutrition Nursing Diagnoses: Potential for alteratiion in Nutrition/Potential for imbalanced nutrition Radigan, Iker L. ([id number]) Goals: Patient/caregiver agrees to and verbalizes understanding of need to use nutritional supplements and/or vitamins as prescribed Date Initiated: 04/28/2014 Goal Status: Active Interventions: Assess patient nutrition upon admission and as needed per policy Notes: Orientation to the Wound Care Program Nursing Diagnoses: Knowledge deficit related to the wound healing center program Goals: Patient/caregiver will verbalize understanding of the Wound Healing Center Program Date Initiated: 04/28/2014 Goal Status: Active Interventions: Provide education on orientation to the wound center Notes: Wound/Skin Impairment Nursing Diagnoses: Impaired tissue integrity Goals: Ulcer/skin breakdown will have a volume reduction of 30% by week 4 Date Initiated: 04/28/2014 Goal Status: Active Interventions: Assess ulceration(s) every visit Notes: Electronic Signature(s) Signed: 05/26/2014 5:45:27 PM By: Curtis Sites Entered By: Curtis Sites on 05/26/2014 16:18:43 Kovich, Kinte L. ([id number]) -------------------------------------------------------------------------------- Pain Assessment Details Patient Name: Giacomo, Soua L. Date of Service: 05/26/2014 3:30 PM Medical Record Number: [id number] Patient Account Number: [id number] Date of Birth/Sex: 05-08-1920 (81 y.o. Male) Treating RN: Kristin Bruins Primary Care Physician: Lindwood Qua Other Clinician: Referring Physician: Lindwood Qua Treating Physician/Extender: Rudene Re in Treatment: 4 Active Problems Location of Pain Severity and Description of Pain Patient Has Paino No Site Locations Pain Management and Medication Current Pain Management: Electronic Signature(s) Signed: 05/26/2014 4:11:43 PM By: Kristin Bruins Entered  By: Kristin Bruins on 05/26/2014 15:52:16 Amparan, [name] ([id number]) -------------------------------------------------------------------------------- Patient/Caregiver Education Details Patient Name: Lingafelter, Dezman L. Date of Service: 05/26/2014 3:30 PM Medical Record Number: [id number] Patient Account Number: [id number] Date of Birth/Gender: June 22, 1920 (81 y.o. Male) Treating RN: Kristin Bruins Primary Care Physician: Lindwood Qua Other Clinician: Referring Physician: Lindwood Qua Treating Physician/Extender: Rudene Re in Treatment: 4 Education Assessment Education Provided To: Patient and Caregiver son Education Topics Provided Wound/Skin Impairment: Handouts: Caring for Your Ulcer Methods: Demonstration, Explain/Verbal Responses: Reinforcements needed Electronic Signature(s) Signed: 05/26/2014 4:55:43 PM By: Kristin Bruins Entered By: Kristin Bruins on 05/26/2014 16:49:19 Claytor, Arshawn Elbert Ewings ([id number]) -------------------------------------------------------------------------------- Wound Assessment Details Patient Name: Perine, Keivon L. Date of Service: 05/26/2014 3:30 PM Medical Record Number: [id number] Patient Account Number: [id number] Date of Birth/Sex: 07/05/1920 (81 y.o. Male) Treating RN: Kristin Bruins Primary Care Physician: Lindwood Qua Other Clinician: Referring Physician: Lindwood Qua Treating Physician/Extender: Rudene Re in Treatment: 4 Wound Status Wound Number: 1 Primary Arterial Insufficiency Ulcer Etiology: Wound Location: Left Achilles Wound Open Wounding Event: Gradually Appeared Status: Date Acquired: 01/24/2014 Comorbid Cataracts, Chronic sinus Weeks Of Treatment: 4 History: problems/congestion, Congestive Heart Clustered Wound: No Failure, Coronary Artery Disease, Pending Amputation On Presentation Hypertension, Myocardial Infarction, Peripheral Arterial Disease, Type II Diabetes, Neuropathy Wound  Measurements Length: (cm) 6.8 Width: (cm) 4.9 Depth: (cm) 1.5 Area: (cm) 26.169 Volume: (cm) 39.254 % Reduction in Area: -5.8% % Reduction in Volume: -429% Epithelialization: None Tunneling: No Undermining: No Wound Description Full Thickness With Exposed Foul Odor Af Classification: Support Structures Diabetic Severity Grade 3 (Wagner): [address] Verification: Abscess Wound Margin: Well defined, not attached Exudate Amount: Medium Exudate Type: Serous Exudate Color: amber ter Cleansing: No Wound Bed Granulation Amount: Small (1-33%) Exposed Structure Granulation Quality: Pink Fascia Exposed: No Necrotic Amount: Large (67-100%) Fat Layer Exposed: No Necrotic Quality: Eschar, Adherent Slough Tendon Exposed: Yes Muscle Exposed: No Joint Exposed: No Bone Exposed: No Periwound Skin Texture Mothershead, Evren L. ([id number]) Texture Color No Abnormalities  Noted: No No Abnormalities Noted: No Callus: No Atrophie Blanche: No Crepitus: No Cyanosis: No Excoriation: No Ecchymosis: No Fluctuance: No Erythema: Yes Friable: No Erythema Location: Circumferential Induration: No Erythema Change: No Change Localized Edema: No Hemosiderin Staining: No Rash: No Mottled: No Scarring: No Pallor: No Rubor: No Moisture No Abnormalities Noted: No Temperature / Pain Dry / Scaly: No Temperature: No Abnormality Maceration: No Moist: No Wound Preparation Ulcer Cleansing: Rinsed/Irrigated with Saline Topical Anesthetic Applied: Other: lidocaine 4%, Treatment Notes Wound #1 (Left Achilles) 1. Cleansed with: Clean wound with Normal Saline 2. Anesthetic Topical Lidocaine 4% cream to wound bed prior to debridement 4. Dressing Applied: Santyl Ointment 5. Secondary Dressing Applied ABD Pad Dry Gauze ABD and Kerlix/Conform Electronic Signature(s) Signed: 05/26/2014 4:11:43 PM By: Kristin Bruins Entered By: Kristin Bruins on 05/26/2014 16:04:23 Celaya, Soul Elbert Ewings  ([id number]) -------------------------------------------------------------------------------- Wound Assessment Details Patient Name: Terrero, Trevar L. Date of Service: 05/26/2014 3:30 PM Medical Record Number: [id number] Patient Account Number: [id number] Date of Birth/Sex: 04-28-1920 (81 y.o. Male) Treating RN: Kristin Bruins Primary Care Physician: Lindwood Qua Other Clinician: Referring Physician: Lindwood Qua Treating Physician/Extender: Rudene Re in Treatment: 4 Wound Status Wound Number: 2 Primary Arterial Insufficiency Ulcer Etiology: Wound Location: Left Calcaneous - Lateral Wound Open Wounding Event: Gradually Appeared Status: Date Acquired: 01/24/2014 Comorbid Cataracts, Chronic sinus Weeks Of Treatment: 4 History: problems/congestion, Congestive Heart Clustered Wound: Yes Failure, Coronary Artery Disease, Hypertension, Myocardial Infarction, Peripheral Arterial Disease, Type II Diabetes, Neuropathy Wound Measurements Length: (cm) 1.1 Width: (cm) 3.8 Depth: (cm) 0.1 Area: (cm) 3.283 Volume: (cm) 0.328 % Reduction in Area: 41.9% % Reduction in Volume: 41.9% Epithelialization: None Tunneling: No Undermining: No Wound Description Full Thickness Without Classification: Exposed Support Structures Diabetic Severity Grade 1 (Wagner): Wound Margin: Distinct, outline attached Exudate Amount: Small Exudate Type: Serous Exudate Color: amber Foul Odor After Cleansing: No Wound Bed Granulation Amount: Small (1-33%) Exposed Structure Granulation Quality: Pink Fascia Exposed: No Necrotic Amount: Medium (34-66%) Fat Layer Exposed: No Necrotic Quality: Adherent Slough Tendon Exposed: No Muscle Exposed: No Joint Exposed: No Bone Exposed: No Limited to Skin Breakdown Periwound Skin Texture Jaeger, Chauncey L. ([id number]) Texture Color No Abnormalities Noted: No No Abnormalities Noted: No Callus: No Atrophie Blanche: No Crepitus: No Cyanosis:  No Excoriation: No Ecchymosis: No Fluctuance: No Erythema: No Friable: No Hemosiderin Staining: No Induration: No Mottled: No Localized Edema: No Pallor: No Rash: No Rubor: No Scarring: No Temperature / Pain Moisture Temperature: No Abnormality No Abnormalities Noted: No Tenderness on Palpation: Yes Dry / Scaly: No Maceration: No Moist: No Wound Preparation Ulcer Cleansing: Rinsed/Irrigated with Saline Topical Anesthetic Applied: Other: idocaine 4%, Treatment Notes Wound #2 (Left, Lateral Calcaneous) 1. Cleansed with: Clean wound with Normal Saline 2. Anesthetic Topical Lidocaine 4% cream to wound bed prior to debridement 4. Dressing Applied: Santyl Ointment 5. Secondary Dressing Applied ABD Pad Dry Gauze ABD and Kerlix/Conform Electronic Signature(s) Signed: 05/26/2014 4:11:43 PM By: Kristin Bruins Entered By: Kristin Bruins on 05/26/2014 16:05:06 Kail, Ziquan Elbert Ewings ([id number]) -------------------------------------------------------------------------------- Wound Assessment Details Patient Name: Pherigo, Carel L. Date of Service: 05/26/2014 3:30 PM Medical Record Number: [id number] Patient Account Number: [id number] Date of Birth/Sex: 12/08/1920 (81 y.o. Male) Treating RN: Kristin Bruins Primary Care Physician: Lindwood Qua Other Clinician: Referring Physician: Lindwood Qua Treating Physician/Extender: Rudene Re in Treatment: 4 Wound Status Wound Number: 3 Primary Arterial Insufficiency Ulcer Etiology: Wound Location: Left Toe Great Wound Open Wounding Event: Gradually Appeared Status: Date Acquired: 01/24/2014 Comorbid Cataracts, Chronic sinus Weeks Of Treatment:  4 History: problems/congestion, Congestive Heart Clustered Wound: No Failure, Coronary Artery Disease, Hypertension, Myocardial Infarction, Peripheral Arterial Disease, Type II Diabetes, Neuropathy Wound Measurements Length: (cm) 1 Width: (cm) 1.2 Depth: (cm) 0.1 Area: (cm)  0.942 Volume: (cm) 0.094 % Reduction in Area: -9% % Reduction in Volume: -9.3% Epithelialization: None Wound Description Classification: Unclassifiable Diabetic Severity [address](Wagner): Grade 0 Wound Margin: Distinct, outline attach Exudate Amount: None Present Foul Odor After Cleansing: No ed Wound Bed Granulation Amount: None Present (0%) Exposed Structure Necrotic Amount: Large (67-100%) Fascia Exposed: No Necrotic Quality: Eschar Fat Layer Exposed: No Tendon Exposed: No Muscle Exposed: No Joint Exposed: No Bone Exposed: No Limited to Skin Breakdown Periwound Skin Texture Texture Color No Abnormalities Noted: No No Abnormalities Noted: No Callus: No Atrophie Blanche: No Bright, Demarquis L. ([id number]) Crepitus: No Cyanosis: No Excoriation: No Ecchymosis: No Fluctuance: No Erythema: Yes Friable: No Erythema Location: Circumferential Induration: No Hemosiderin Staining: No Localized Edema: No Mottled: No Rash: No Pallor: No Scarring: No Rubor: No Moisture Temperature / Pain No Abnormalities Noted: No Temperature: No Abnormality Dry / Scaly: No Maceration: No Moist: No Wound Preparation Ulcer Cleansing: Rinsed/Irrigated with Saline Topical Anesthetic Applied: Other: LIDOCAINE 4%, Treatment Notes Wound #3 (Left Toe Great) 1. Cleansed with: Clean wound with Normal Saline 4. Dressing Applied: Other dressing (specify in notes) 5. Secondary Dressing Applied Dry Gauze 7. Secured with Paper tape Notes betadine paint Electronic Signature(s) Signed: 05/26/2014 4:11:43 PM By: Kristin Bruinsanneker, Nancy Entered By: Kristin Bruinsanneker, Nancy on 05/26/2014 16:05:28 Baynes, Raoul [name] ([id number]) -------------------------------------------------------------------------------- Vitals Details Patient Name: Grumbine, Burman L. Date of Service: 05/26/2014 3:30 PM Medical Record Number: [id number] Patient Account Number: [id number] Date of Birth/Sex: [birth year](81 y.o. Male) Treating RN:  Kristin Bruinsanneker, Nancy Primary Care Physician: [name], [name] Other Clinician: Referring Physician: [name], [name] Treating Physician/Extender: Rudene ReBritto, Errol Weeks in Treatment: 4 Vital Signs Time Taken: 15:45 Temperature (F): 97.8 Height (in): 70 Pulse (bpm): 88 Weight (lbs): 200 Respiratory Rate (breaths/min): 18 Body Mass Index (BMI): 28.7 Blood Pressure (mmHg): 135/55 Reference Range: 80 - 120 mg / dl Electronic Signature(s) Signed: 05/26/2014 4:11:43 PM By: Kristin Bruinsanneker, Nancy Entered By: Kristin Bruinsanneker, Nancy on 05/26/2014 15:52:45

## 2014-05-27 NOTE — Progress Notes (Signed)
[name], Lajarvis L. ([id number]) Visit Report for 05/26/2014 HBO Risk Assessment Details Patient Name: Tencza, Tomoya L. Date of Service: 05/26/2014 3:30 PM Medical Record Number: [id number] Patient Account Number: [id number] Date of Birth/Sex: [birth year] [age] y.o. Male) Treating RN: Huel CoventryWoody, Kim Primary Care Physician: [name], [name] Other Clinician: Referring Physician: [name], [name] Treating Physician/Extender: Rudene ReBritto, Errol Weeks in Treatment: 4 HBO Risk Assessment Items Answer Barotrauma Risks: Upper Respiratory Infections No Prior Radiation Treatment to Head/Neck No Tracheostomy No Ear problems or surgery (otosclerosis)- Consider pressure equalization tubes No Sinus Problems, Sinus Obstruction No Pulmonary Risks: Currently seeing a pulmonologisto No Emphysema No Pneumothorax No Tuberculosis No Other lung problems (COPD with CO2 retention, lesions, surgery) -Refer to No CPGs Congestive heart Failure -Consider holding HBO if ejection fraction<30% Yes History of smoking No Bullous Disease, Blebs No Other pulmonary abnormalities No Cardiac Risks: Currently seeing a cardiologisto Yes Pacemaker/AICD Yes Hypertension Yes Diuretic Used (water pill). If yes, last time taken: Yes History of prior or current malignancy (Cancer) Surgery No Radiation therapy No Chemotherapy No Ophthalmic Risks: Optic Neuritis No Waddle, Aidric L. ([id number]) Cataracts No Myopia No Retinopathy or Retinal Detachment Surgery- Consider pressure equalization No tubes Confinement Anxiety Claustrophobia Yes Dialysis Dialysis No Any implants; medical or non-medical No Pregnancy No Diabetes Non-Insulin if Yes for Diabetes: Dependent Diabetes Medication: glipizide HgbA1C within 3 months Yes Seizures Seizures No Currently using these medications: Aspirin No Digoxin (CHF patient) No Narcotics No Nitroprusside No Phenothiazine (Thorazine,etc.) No Prednisone or other steroids No Disulfiram  (Antabuse) No Mafenide Acetate (Sulfamylon-burn cream) No Amiodarone No Date of last Chest X-ray: 05/12/2014 Date of last EKG: 05/17/2014 Date of Last CBC: 05/12/2014 Electronic Signature(s) Signed: 05/26/2014 4:41:22 PM By: Curtis Sitesorthy, Joanna Signed: 05/26/2014 5:19:47 PM By: Elliot GurneyWoody, RN, BSN, Kim RN, BSN Previous Signature: 05/26/2014 4:39:47 PM Version By: Curtis Sitesorthy, Joanna Entered By: Curtis Sitesorthy, Joanna on 05/26/2014 16:41:22

## 2014-05-30 ENCOUNTER — Encounter: Payer: Medicare Other | Admitting: Surgery

## 2014-05-30 DIAGNOSIS — L97323 Non-pressure chronic ulcer of left ankle with necrosis of muscle: Secondary | ICD-10-CM | POA: Diagnosis not present

## 2014-05-30 LAB — GLUCOSE, CAPILLARY
GLUCOSE-CAPILLARY: 132 mg/dL — AB (ref 70–99)
Glucose-Capillary: 260 mg/dL — ABNORMAL HIGH (ref 70–99)

## 2014-05-30 NOTE — Progress Notes (Signed)
[name], Raeshawn L. ([id number]) Visit Report for 05/30/2014 HBO Details Patient Name: Carreira, Cohen L. Date of Service: 05/30/2014 10:00 AM Medical Record Number: [id number] Patient Account Number: [id number] Date of Birth/Sex: 02/26/1920 [age] y.o. 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 HBO Treatment Details Treatment Number: 1 Patient Type: Outpatient Chamber Type: Monoplace Chamber #: YQM#[id number] Treatment Protocol: 2.0 ATA with 90 minutes oxygen, and no air breaks Treatment Details Compression Rate Down: 1.5 psi / minute De-Compression Rate Up: 1.5 psi / minute Air breaks and breathing Compress Tx Pressure Decompress Decompress periods Begins Reached Begins Ends (leave unused spaces blank) Chamber Pressure 1 ATA 2.0 ATA - - - - - - 2.0 ATA 1 ATA Clock Time (24 hr) 10:20 10:32 - - - - - - 12:02 12:13 Treatment Length: 113 (minutes) Treatment Segments: 4 Capillary Blood Glucose Pre Capillary Blood Glucose (mg/dl): Post Capillary Blood Glucose (mg/dl): Vital Signs Capillary Blood Glucose Reference Range: 80 - 120 mg / dl HBO Diabetic Blood Glucose Intervention Range: <131 mg/dl or >295>249 mg/dl Time Vitals Blood Respiratory Capillary Blood Glucose Pulse Action Type: Pulse: Temperature: Taken: Pressure: Rate: Glucose (mg/dl): Meter #: Oximetry (%) Taken: Pre 09:21 120/52 78 18 97.8 260 1 none Post 12:18 124/50 72 18 97.9 132 1 none Pre-Treatment Ear Evaluation Left Right Crapps, Ulysse L. ([id number]) Clear: Yes Clear: Yes Intact: Yes Intact: Yes PE Tubes inserted: No PE Tubes  inserted: No Irrigated: No Irrigated: No Left Teed Scale: Grade 0 Right Teed Scale: Grade 0 Treatment Response Treatment Well Toleration: Treatment Treatment Completed without Adverse Event Completion Status: Physician Notes in good general health with normal auscultation of heart and lungs. HBO Attestation I certify that I supervised this HBO treatment in accordance with Medicare guidelines. A trained Yes emergency response team is readily available per hospital policies and procedures. Continue HBOT as ordered. Yes Electronic Signature(s) Signed: 05/30/2014 5:02:53 PM By: Evlyn KannerBritto, Errol MD, FACS Entered By: Evlyn KannerBritto, Errol on 05/30/2014 13:54:43 Debono, Catarino Elbert EwingsL. ([id number]) -------------------------------------------------------------------------------- HBO Risk Assessment Details Patient Name: Councilman, Fishel L. Date of Service: 05/30/2014 10:00 AM Medical Record Number: [id number] Patient Account Number: [id number] Date of Birth/Sex: 04/01/1920 [age] y.o. Male) Treating RN: Huel CoventryWoody, Kim Primary Care Physician: [name], [name] Other Clinician: Referring Physician: [name], [name] Treating Physician/Extender: Rudene ReBritto, Errol Weeks in Treatment: 4 HBO Risk Assessment Items Answer Barotrauma Risks: Upper Respiratory Infections No Prior Radiation Treatment to Head/Neck No Tracheostomy No Ear problems or surgery (otosclerosis)- Consider pressure equalization tubes No Sinus Problems, Sinus Obstruction No Pulmonary Risks: Currently seeing a pulmonologisto No Emphysema No Pneumothorax No Tuberculosis No Other lung problems (COPD with CO2 retention, lesions, surgery) -Refer to No CPGs Congestive heart Failure -Consider holding HBO if ejection fraction<30% Yes History of smoking No Bullous Disease, Blebs No Other pulmonary abnormalities No Cardiac Risks: Currently seeing a cardiologisto Yes Pacemaker/AICD Yes if Yes for Pacemaker: Brand and Model Medtronic 5076-52cm Hypertension  Yes Diuretic Used (water pill). If yes, last time taken: Yes History of prior or current malignancy (Cancer) Surgery No Radiation therapy No Chemotherapy No Ophthalmic Risks: Optic Neuritis No Cataracts No Myopia No Buffin, Stefano L. ([id number]) Retinopathy or Retinal Detachment Surgery- Consider pressure equalization No tubes Confinement Anxiety Claustrophobia Yes Dialysis Any implants;  medical or non-medical No Pregnancy No Diabetes Non-Insulin if Yes for Diabetes: Dependent Diabetes Medication: glipizide HgbA1C within 3 months Yes Seizures Seizures No Currently using these medications: Aspirin No Digoxin (CHF patient) No Narcotics No Nitroprusside No Phenothiazine (Thorazine,etc.) No Prednisone or other steroids No Disulfiram (Antabuse) No Mafenide Acetate (Sulfamylon-burn cream) No Amiodarone No Date of last Chest X-ray: 05/12/2014 Date of last EKG: 05/17/2014 Date of Last CBC: 05/12/2014 Electronic Signature(s) Signed: 05/30/2014 9:47:51 AM By: Elliot GurneyWoody, RN, BSN, Kim RN, BSN Entered By: Elliot GurneyWoody, RN, BSN, Kim on 05/30/2014 09:47:51 Branaman, Raoul [name] ([id number]) -------------------------------------------------------------------------------- HBO Safety Checklist Details Patient Name: Osland, Bryndan L. Date of Service: 05/30/2014 10:00 AM Medical Record Number: [id number] Patient Account Number: [id number] Date of Birth/Sex: 06/13/1920 [age] y.o. Male) Treating RN: Primary Care Physician: [name], [name] Other Clinician: Referring Physician: [name], [name] Treating Physician/Extender: Rudene ReBritto, Errol Weeks in Treatment: 4 HBO Safety Checklist Items Safety Checklist Consent Form Signed Patient voided / foley secured and emptied When did you last eato 07:00 am Last dose of injectable or oral agent 07:00 am Ostomy pouch emptied and vented if applicable n/a All implantable devices assessed, documented and approved n/a Intravenous access site secured and place Valuables  secured Linens and cotton and cotton/polyester blend (less than 51% polyester) Personal oil-based products / skin lotions / body lotions removed Wigs or hairpieces removed Smoking or tobacco materials removed Books / newspapers / magazines / loose paper removed Cologne, aftershave, perfume and deodorant removed Jewelry removed (may wrap wedding band) Make-up removed n/a Hair care products removed Battery operated devices (external) removed Heating patches and chemical warmers removed Titanium eyewear removed Nail polish cured greater than 10 hours n/a Casting material cured greater than 10 hours n/a Hearing aids removed Loose dentures or partials removed Prosthetics have been removed n/a Patient demonstrates correct use of air break device (if applicable) Patient concerns have been addressed Patient grounding bracelet on and cord attached to chamber Specifics for Inpatients (complete in addition to above) Medication sheet sent with patient Intravenous medications needed or due during therapy sent with patient [name], Fidencio L. ([id number]) Drainage tubes (e.g. nasogastric tube or chest tube secured and vented) Endotracheal or Tracheotomy tube secured Cuff deflated of air and inflated with saline Airway suctioned Electronic Signature(s) Signed: 05/30/2014 4:24:42 PM By: Dayton MartesWallace, RCP,RRT,CHT, Sallie RCP, RRT, CHT Entered By: Weyman RodneyWallace, RCP,RRT,CHT, Lucio EdwardSallie on 05/30/2014 09:29:26

## 2014-05-30 NOTE — Progress Notes (Signed)
[name], Nazire L. ([id number]) Visit Report for 05/30/2014 Problem List Details Patient Name: [name], [name] Date of Service: 05/30/2014 10:00 AM Medical Record Number: [id number] Patient Account Number: [id number] Date of Birth/Sex: 05-10-1920 [age] y.o. 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 ([id number]) -------------------------------------------------------------------------------- SuperBill Details Patient Name: [name], [name] Date of Service: 05/30/2014 Medical Record Number: [id number] Patient Account Number: [id number] Date of Birth/Sex: 05-10-1920 [age] y.o. 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 of left  ankle with necrosis of muscle M86.372 Chronic multifocal osteomyelitis, left ankle and foot Facility Procedures CPT4 Code: [id number] Description: (Facility Use Only) HBOT, full body chamber, 30min Modifier: Quantity: 4 Physician Procedures CPT4 Code: 32440106770762 Description: [id number] - WC PHYS HYPERBARIC OXYGEN THERAPY ICD-10 Description Diagnosis M86.372 Chronic multifocal osteomyelitis, left ankle and foo Modifier: t Quantity: 1 Electronic Signature(s) Signed: 05/30/2014 5:02:53 PM By: Evlyn KannerBritto, Cassady Turano MD, FACS Entered By: Evlyn KannerBritto, Tarrell Debes on 05/30/2014 16:04:18

## 2014-05-30 NOTE — Progress Notes (Signed)
[name], [name] ([id number]) Visit Report for 05/30/2014 Arrival Information Details Patient Name: [name], [name] Date of Service: 05/30/2014 10:00 AM Medical Record Number: [id number] Patient Account Number: [id number] Date of Birth/Sex: 04/06/1920 [age] y.o. Male) Treating RN: Primary Care Physician: [name], [name] Other Clinician: Referring Physician: [name], [name] Treating Physician/Extender: Rudene ReBritto, Errol Weeks in Treatment: 4 Visit Information History Since Last Visit Added or deleted any medications: No Patient Arrived: Wheel Chair Any new allergies or adverse reactions: No Arrival Time: 09:15 Had a fall or experienced change in No activities of daily living that may affect Accompanied By: son risk of falls: Transfer Assistance: Manual Signs or symptoms of abuse/neglect since last No Patient Identification Verified: Yes visito Secondary Verification Process Yes Hospitalized since last visit: No Completed: Has Dressing in Place as Prescribed: Yes Patient Requires Transmission-Based No Pain Present Now: No Precautions: Patient Has Alerts: No Electronic Signature(s) Signed: 05/30/2014 4:24:42 PM By: Dayton MartesWallace, RCP,RRT,CHT, Sallie RCP, RRT, CHT Entered By: Dayton MartesWallace, RCP,RRT,CHT, Sallie on 05/30/2014 11:09:42 [name], [name] ([id number]) -------------------------------------------------------------------------------- Encounter Discharge Information Details Patient Name: [name], [name] Date of Service: 05/30/2014 10:00 AM Medical Record Number: [id number] Patient Account Number: [id number] Date of Birth/Sex: 04/06/1920 [age] y.o. Male) Treating RN: Primary Care Physician: [name], [name] Other Clinician: Izetta DakinWallace, Sallie Referring Physician: [name], [name] Treating Physician/Extender: Rudene ReBritto, Errol Weeks in Treatment: 4 Encounter Discharge Information Items Discharge Pain Level: 0 Discharge Condition: Stable Ambulatory Status: Wheelchair Discharge Destination:  Home Transportation: Private Auto Accompanied By: son, Reita ClicheBobby Schedule Follow-up Appointment: No Medication Reconciliation completed No and provided to Patient/Care Frandy Basnett: Patient Clinical Summary of Care: Declined Notes Patient has an HBO treatment scheduled on 05/31/14 at 10:00 am. Electronic Signature(s) Signed: 05/30/2014 2:04:10 PM By: Gwenlyn PerkingMoore, Shelia Entered By: Gwenlyn PerkingMoore, Shelia on 05/30/2014 14:04:09 [name], [name] ([id number]) -------------------------------------------------------------------------------- Vitals Details Patient Name: [name], [name] Date of Service: 05/30/2014 10:00 AM Medical Record Number: [id number] Patient Account Number: [id number] Date of Birth/Sex: 04/06/1920 [age] y.o. Male) Treating RN: Primary Care Physician: [name], [name] Other Clinician: Izetta DakinWallace, Sallie Referring Physician: [name], [name] Treating Physician/Extender: Rudene ReBritto, Errol Weeks in Treatment: 4 Vital Signs Time Taken: 09:21 Temperature (F): 97.8 Height (in): 70 Pulse (bpm): 78 Weight (lbs): 200 Respiratory Rate (breaths/min): 18 Body Mass Index (BMI): 28.7 Blood Pressure (mmHg): 120/52 Capillary Blood Glucose (mg/dl): 536260 Reference Range: 80 - 120 mg / dl Electronic Signature(s) Signed: 05/30/2014 4:24:42 PM By: Dayton MartesWallace, RCP,RRT,CHT, Sallie RCP, RRT, CHT Entered By: Weyman RodneyWallace, RCP,RRT,CHT, Lucio EdwardSallie on 05/30/2014 11:09:52

## 2014-05-31 ENCOUNTER — Encounter: Payer: Medicare Other | Admitting: Surgery

## 2014-05-31 DIAGNOSIS — L97323 Non-pressure chronic ulcer of left ankle with necrosis of muscle: Secondary | ICD-10-CM | POA: Diagnosis not present

## 2014-05-31 LAB — GLUCOSE, CAPILLARY
Glucose-Capillary: 213 mg/dL — ABNORMAL HIGH (ref 70–99)
Glucose-Capillary: 121 mg/dL — ABNORMAL HIGH (ref 70–99)

## 2014-05-31 NOTE — Progress Notes (Signed)
[name], [name] ([id number]) Visit Report for 05/31/2014 Arrival Information Details Patient Name: [name], Ubaldo L. Date of Service: 05/31/2014 10:00 AM Medical Record Number: [id number] Patient Account Number: [id number] Date of Birth/Sex: 09-18-1920 [age] y.o. Male) Treating RN: Primary Care Physician: [name], [name] Other Clinician: Izetta DakinWallace, Sallie Referring Physician: [name], [name] Treating Physician/Extender: Rudene ReBritto, Errol Weeks in Treatment: 4 Visit Information History Since Last Visit Added or deleted any medications: No Patient Arrived: Wheel Chair Any new allergies or adverse reactions: No Arrival Time: 09:15 Had a fall or experienced change in No activities of daily living that may affect Accompanied By: son, risk of falls: Bobby Signs or symptoms of abuse/neglect since last No Transfer Assistance: Manual visito Patient Identification Verified: Yes Hospitalized since last visit: No Secondary Verification Process Yes Has Dressing in Place as Prescribed: Yes Completed: Pain Present Now: No Patient Requires Transmission-Based No Precautions: Patient Has Alerts: No Electronic Signature(s) Signed: 05/31/2014 3:43:03 PM By: Dayton MartesWallace, RCP,RRT,CHT, Sallie RCP, RRT, CHT Entered By: Dayton MartesWallace, RCP,RRT,CHT, Sallie on 05/31/2014 09:30:25 [name], [name] ([id number]) -------------------------------------------------------------------------------- Encounter Discharge Information Details Patient Name: [name], [name] Date of Service: 05/31/2014 10:00 AM Medical Record Number: [id number] Patient Account Number: [id number] Date of Birth/Sex: 09-18-1920 [age] y.o. Male) Treating RN: Primary Care Physician: [name], [name] Other Clinician: Izetta DakinWallace, Sallie Referring Physician: [name], [name] Treating Physician/Extender: Rudene ReBritto, Errol Weeks in Treatment: 4 Encounter Discharge Information Items Discharge Pain Level: 0 Discharge Condition: Stable Ambulatory Status: Wheelchair Discharge  Destination: Home Private Transportation: Auto Accompanied By: son, Reita ClicheBobby Schedule Follow-up Appointment: No Medication Reconciliation completed and No provided to Patient/Care Kyah Buesing: Clinical Summary of Care: Notes Patient has an HBO treatment scheduled on 06/01/14 at 10:00 am. Electronic Signature(s) Signed: 05/31/2014 3:43:03 PM By: Dayton MartesWallace, RCP,RRT,CHT, Sallie RCP, RRT, CHT Entered By: Dayton MartesWallace, RCP,RRT,CHT, Sallie on 05/31/2014 13:01:36 [name], [name] ([id number]) -------------------------------------------------------------------------------- Vitals Details Patient Name: [name], [name] Date of Service: 05/31/2014 10:00 AM Medical Record Number: [id number] Patient Account Number: [id number] Date of Birth/Sex: 09-18-1920 [age] y.o. Male) Treating RN: Primary Care Physician: [name], [name] Other Clinician: Izetta DakinWallace, Sallie Referring Physician: [name], [name] Treating Physician/Extender: Rudene ReBritto, Errol Weeks in Treatment: 4 Vital Signs Time Taken: 09:18 Temperature (F): 98.7 Height (in): 70 Pulse (bpm): 84 Weight (lbs): 200 Respiratory Rate (breaths/min): 18 Body Mass Index (BMI): 28.7 Blood Pressure (mmHg): 122/50 Capillary Blood Glucose (mg/dl): 536213 Reference Range: 80 - 120 mg / dl Electronic Signature(s) Signed: 05/31/2014 3:43:03 PM By: Dayton MartesWallace, RCP,RRT,CHT, Sallie RCP, RRT, CHT Entered By: Dayton MartesWallace, RCP,RRT,CHT, Sallie on 05/31/2014 09:32:16

## 2014-05-31 NOTE — Progress Notes (Signed)
[name] [name], [name] L. ([id number]) Visit Report for 05/31/2014 HBO Details Patient Name: [name] [name] L. Date of Service: 05/31/2014 10:00 AM Medical Record Number: [id number] Patient Account Number: [id number] Date of Birth/Sex: 07-24-1920 [age] y.o. 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 HBO Treatment Details Treatment Number: 2 Patient Type: Outpatient Chamber Type: Monoplace Chamber #: [id number] Treatment Protocol: 2.0 ATA with 90 minutes oxygen, and no air breaks Treatment Details Compression Rate Down: 1.5 psi / minute De-Compression Rate Up: 1.5 psi / minute Air breaks and breathing Compress Tx Pressure Decompress Decompress periods Begins Reached Begins Ends (leave unused spaces blank) Chamber Pressure 1 ATA 2.0 ATA - - - - - - 2.0 ATA 1 ATA Clock Time (24 hr) 10:41 10:53 - - - - - - 12:23 12:34 Treatment Length: 113 (minutes) Treatment Segments: 4 Capillary Blood Glucose Pre Capillary Blood Glucose (mg/dl): Post Capillary Blood Glucose (mg/dl): Vital Signs Capillary Blood Glucose Reference Range: 80 - 120 mg / dl HBO Diabetic Blood Glucose Intervention Range: <131 mg/dl or >401>249 mg/dl Time Vitals Blood Respiratory Capillary Blood Glucose Pulse Action Type: Pulse: Temperature: Taken: Pressure: Rate: Glucose (mg/dl): Meter #: Oximetry (%) Taken: Pre 09:18 122/50 84 18 98.7 213 1 none Post 12:40 122/710 84 18 98 Treatment Response Well Collister, Erroll L. ([id number]) Treatment Toleration: Treatment Treatment Completed without Adverse Event Completion  Status: HBO Attestation I certify that I supervised this HBO treatment in accordance with Medicare guidelines. A trained Yes emergency response team is readily available per hospital policies and procedures. Continue HBOT as ordered. Yes Electronic Signature(s) Signed: 05/31/2014 2:25:04 PM By: Evlyn KannerBritto, Errol MD, FACS Entered By: Evlyn KannerBritto, Errol on 05/31/2014 13:35:01 [name] [name] [name] ([id number]) -------------------------------------------------------------------------------- HBO Safety Checklist Details Patient Name: [name] [name] L. Date of Service: 05/31/2014 10:00 AM Medical Record Number: [id number] Patient Account Number: [id number] Date of Birth/Sex: 07-24-1920 [age] y.o. Male) Treating RN: Primary Care Physician: [name], [name] Other Clinician: Izetta DakinWallace, [name] Referring Physician: [name], [name] Treating Physician/Extender: Rudene ReBritto, Errol Weeks in Treatment: 4 HBO Safety Checklist Items Safety Checklist Consent Form Signed Patient voided / foley secured and emptied When did you last eato 07:00 am Last dose of injectable or oral agent 07:00 am Ostomy pouch emptied and vented if applicable n/a All implantable devices assessed, documented and approved n/a Intravenous access site secured and place PICC line Valuables secured Linens and cotton and cotton/polyester blend (less than 51% polyester) Personal oil-based products / skin lotions / body lotions removed Wigs or hairpieces removed Smoking or tobacco materials removed Books / newspapers / magazines / loose paper removed Cologne, aftershave, perfume and deodorant removed Jewelry removed (may wrap wedding band) Make-up removed n/a Hair care products removed Battery operated devices (external) removed Heating patches and chemical warmers removed Titanium eyewear removed Nail polish cured greater than 10 hours n/a Casting material cured greater than 10 hours n/a Hearing aids removed Loose dentures or partials  removed Prosthetics have been removed n/a Patient demonstrates correct use of air break device (if applicable) Patient concerns have been addressed Patient grounding bracelet on and cord attached to chamber Specifics for Inpatients (complete in addition to above) Medication sheet sent with patient Intravenous medications needed or due during therapy sent with patient  [name] [name] [name] L. ([id number]) Drainage tubes (e.g. nasogastric tube or chest tube secured and vented) Endotracheal or Tracheotomy tube secured Cuff deflated of air and inflated with saline Airway suctioned Electronic Signature(s) Signed: 05/31/2014 3:43:03 PM By: [name] [name] RCP, RRT, CHT Entered By: [name] [name] on 05/31/2014 09:34:00

## 2014-06-01 ENCOUNTER — Encounter: Payer: Medicare Other | Admitting: Surgery

## 2014-06-01 DIAGNOSIS — L97323 Non-pressure chronic ulcer of left ankle with necrosis of muscle: Secondary | ICD-10-CM | POA: Diagnosis not present

## 2014-06-01 LAB — GLUCOSE, CAPILLARY
GLUCOSE-CAPILLARY: 167 mg/dL — AB (ref 70–99)
Glucose-Capillary: 236 mg/dL — ABNORMAL HIGH (ref 70–99)

## 2014-06-01 NOTE — Progress Notes (Signed)
[name], Winson L. ([id number]) Visit Report for 06/01/2014 Arrival Information Details Patient Name: [name], [name] Date of Service: 06/01/2014 10:00 AM Medical Record Number: [id number] Patient Account Number: [id number] Date of Birth/Sex: 1920-02-12 12(81 y.o. Male) Treating RN: Primary Care Physician: [name], [name] Other Clinician: Izetta DakinWallace, Sallie Referring Physician: [name], [name] Treating Physician/Extender: Tania AdeWeeks in Treatment: 4 Visit Information History Since Last Visit Added or deleted any medications: No Patient Arrived: Wheel Chair Any new allergies or adverse reactions: No Arrival Time: 09:15 Had a fall or experienced change in No activities of daily living that may affect Accompanied By: son, risk of falls: Bobby Signs or symptoms of abuse/neglect since last No Transfer Assistance: Manual visito Patient Identification Verified: Yes Hospitalized since last visit: No Secondary Verification Process Yes Has Dressing in Place as Prescribed: Yes Completed: Pain Present Now: No Patient Requires Transmission-Based No Precautions: Patient Has Alerts: No Electronic Signature(s) Signed: 06/01/2014 4:28:24 PM By: Dayton MartesWallace, RCP,RRT,CHT, Sallie RCP, RRT, CHT Entered By: Dayton MartesWallace, RCP,RRT,CHT, Sallie on 06/01/2014 09:33:41 [name], [name] ([id number]) -------------------------------------------------------------------------------- Encounter Discharge Information Details Patient Name: [name], [name] Date of Service: 06/01/2014 10:00 AM Medical Record Number: [id number] Patient Account Number: [id number] Date of Birth/Sex: 1920-02-12 [age] y.o. Male) Treating RN: Primary Care Physician: [name], [name] Other Clinician: Izetta DakinWallace, Sallie Referring Physician: [name], [name] Treating Physician/Extender: Tania AdeWeeks in Treatment: 4 Encounter Discharge Information Items Discharge Pain Level: 0 Discharge Condition: Stable Ambulatory Status: Wheelchair Discharge Destination:  Home Private Transportation: Auto Accompanied By: Reita ClicheBobby, son Schedule Follow-up Appointment: No Medication Reconciliation completed and No provided to Patient/Care Jeanita Carneiro: Clinical Summary of Care: Notes Patient has an HBO treatment scheduled on 06/02/14 at 10:00 am. Electronic Signature(s) Signed: 06/01/2014 4:28:24 PM By: Dayton MartesWallace, RCP,RRT,CHT, Sallie RCP, RRT, CHT Entered By: Dayton MartesWallace, RCP,RRT,CHT, Sallie on 06/01/2014 13:29:36 [name], [name] ([id number]) -------------------------------------------------------------------------------- Vitals Details Patient Name: [name], [name] Date of Service: 06/01/2014 10:00 AM Medical Record Number: [id number] Patient Account Number: [id number] Date of Birth/Sex: 1920-02-12 [age] y.o. Male) Treating RN: Primary Care Physician: [name], [name] Other Clinician: Izetta DakinWallace, Sallie Referring Physician: [name], [name] Treating Physician/Extender: Tania AdeWeeks in Treatment: 4 Vital Signs Time Taken: 09:17 Temperature (F): 97.9 Height (in): 70 Pulse (bpm): 78 Weight (lbs): 200 Respiratory Rate (breaths/min): 18 Body Mass Index (BMI): 28.7 Blood Pressure (mmHg): 102/52 Capillary Blood Glucose (mg/dl): 536236 Reference Range: 80 - 120 mg / dl Electronic Signature(s) Signed: 06/01/2014 4:28:24 PM By: Dayton MartesWallace, RCP,RRT,CHT, Sallie RCP, RRT, CHT Entered By: Weyman RodneyWallace, RCP,RRT,CHT, Lucio EdwardSallie on 06/01/2014 09:34:07

## 2014-06-01 NOTE — Progress Notes (Signed)
[name] [name] L. ([id number]) Visit Report for 06/01/2014 HBO Details Patient Name: [name] [name] L. Date of Service: 06/01/2014 10:00 AM Medical Record Number: [id number] Patient Account Number: [id number] Date of Birth/Sex: 1920-11-13 [age] y.o. 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 HBO Treatment Details Treatment Number: 3 Patient Type: Outpatient Chamber Type: Monoplace Chamber #: [id number] Treatment Protocol: 2.0 ATA with 90 minutes oxygen, and no air breaks Treatment Details Compression Rate Down: 1.5 psi / minute De-Compression Rate Up: 1.5 psi / minute Air breaks and breathing Compress Tx Pressure Decompress Decompress periods Begins Reached Begins Ends (leave unused spaces blank) Chamber Pressure 1 ATA 2.0 ATA - - - - - - 2.0 ATA 1 ATA Clock Time (24 hr) 10:37 10:49 - - - - - - 12:20 12:31 Treatment Length: 114 (minutes) Treatment Segments: 4 Capillary Blood Glucose Pre Capillary Blood Glucose (mg/dl): Post Capillary Blood Glucose (mg/dl): Vital Signs Capillary Blood Glucose Reference Range: 80 - 120 mg / dl HBO Diabetic Blood Glucose Intervention Range: <131 mg/dl or >474>249 mg/dl Time Vitals Blood Respiratory Capillary Blood Glucose Pulse Action Type: Pulse: Temperature: Taken: Pressure: Rate: Glucose (mg/dl): Meter #: Oximetry (%) Taken: Pre 09:17 102/52 78 18 97.9 236 1 none Post 12:36 102/58 72 18 97.3 167 1 none Treatment Response Well [name] L. ([id number]) Treatment Toleration: Treatment Treatment Completed without Adverse Event Completion  Status: HBO Attestation I certify that I supervised this HBO treatment in accordance with Medicare guidelines. A trained Yes emergency response team is readily available per hospital policies and procedures. Continue HBOT as ordered. Yes Electronic Signature(s) Signed: 06/01/2014 3:52:37 PM By: Madelaine BhatBurns, III, Walter MD Entered By: Madelaine BhatBurns, III, Walter on 06/01/2014 13:01:53 [name] [name] ([id number]) -------------------------------------------------------------------------------- HBO Safety Checklist Details Patient Name: [name] L. Date of Service: 06/01/2014 10:00 AM Medical Record Number: [id number] Patient Account Number: [id number] Date of Birth/Sex: 1920-11-13 [age] y.o. Male) Treating RN: Primary Care Physician: [name], [name] Other Clinician: Izetta DakinWallace, Sallie Referring Physician: [name], [name] Treating Physician/Extender: Tania AdeWeeks in Treatment: 4 HBO Safety Checklist Items Safety Checklist Consent Form Signed Patient voided / foley secured and emptied When did you last eato 07:00 am Last dose of injectable or oral agent 07:00 am Ostomy pouch emptied and vented if applicable n/a All implantable devices assessed, documented and approved n/a Intravenous access site secured and place PICC line in left arm Valuables secured Linens and cotton and cotton/polyester blend (less than 51% polyester) Personal oil-based products / skin lotions / body lotions removed Wigs or hairpieces removed Smoking or tobacco materials removed Books / newspapers / magazines / loose paper removed Cologne, aftershave, perfume and deodorant removed Jewelry removed (may wrap wedding band) Make-up removed n/a Hair care products removed Battery operated devices (external) removed Heating patches and chemical warmers removed Titanium eyewear removed Nail polish cured greater than 10 hours n/a Casting material cured greater than 10 hours n/a Hearing aids removed Loose dentures or partials  removed Prosthetics have been removed n/a Patient demonstrates correct use of air break device (if applicable) Patient concerns have been addressed Patient grounding bracelet on and cord attached to chamber Specifics for Inpatients (complete in addition to above) Medication sheet sent with patient Intravenous medications needed or due during therapy  sent with patient [name] [name] L. ([id number]) Drainage tubes (e.g. nasogastric tube or chest tube secured and vented) Endotracheal or Tracheotomy tube secured Cuff deflated of air and inflated with saline Airway suctioned Electronic Signature(s) Signed: 06/01/2014 4:28:24 PM By: Dayton MartesWallace, RCP,RRT,CHT, Sallie RCP, RRT, CHT Entered By: Dayton MartesWallace, RCP,RRT,CHT, Sallie on 06/01/2014 09:35:53

## 2014-06-02 ENCOUNTER — Ambulatory Visit: Payer: PRIVATE HEALTH INSURANCE | Admitting: Surgery

## 2014-06-02 ENCOUNTER — Encounter: Payer: Medicare Other | Admitting: Surgery

## 2014-06-02 DIAGNOSIS — L97323 Non-pressure chronic ulcer of left ankle with necrosis of muscle: Secondary | ICD-10-CM | POA: Diagnosis not present

## 2014-06-02 LAB — GLUCOSE, CAPILLARY
GLUCOSE-CAPILLARY: 221 mg/dL — AB (ref 65–99)
Glucose-Capillary: 129 mg/dL — ABNORMAL HIGH (ref 65–99)

## 2014-06-02 NOTE — Progress Notes (Signed)
[name], [name] ([id number]) Visit Report for 06/02/2014 HBO Details Patient Name: [name], [name] Date of Service: 06/02/2014 10:00 AM Medical Record Number: [id number] Patient Account Number: [id number] Date of Birth/Sex: 1920/05/28 [age] y.o. 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 HBO Treatment Details Treatment Number: 4 Patient Type: Outpatient Chamber Type: Monoplace Chamber #: [id number] Treatment Protocol: 2.0 ATA with 90 minutes oxygen, and no air breaks Treatment Details Compression Rate Down: 1.5 psi / minute De-Compression Rate Up: 1.5 psi / minute Air breaks and breathing Compress Tx Pressure Decompress Decompress periods Begins Reached Begins Ends (leave unused spaces blank) Chamber Pressure 1 ATA 2.0 ATA - - - - - - 2.0 ATA 1 ATA Clock Time (24 hr) 10:24 10:36 - - - - - - 12:07 12:17 Treatment Length: 113 (minutes) Treatment Segments: 4 Capillary Blood Glucose Pre Capillary Blood Glucose (mg/dl): Post Capillary Blood Glucose (mg/dl): Vital Signs Capillary Blood Glucose Reference Range: 80 - 120 mg / dl HBO Diabetic Blood Glucose Intervention Range: <131 mg/dl or >952>249 mg/dl Time Vitals Blood Respiratory Capillary Blood Glucose Pulse Action Type: Pulse: Temperature: Taken: Pressure: Rate: Glucose (mg/dl): Meter #: Oximetry (%) Taken: Pre 09:30 104/53 88 20 98 221 1 none Post 12:44 102/52 84 18 97.9 Treatment Response Well Cotham, Barlow L. ([id number]) Treatment Toleration: Treatment Treatment Completed without Adverse Event Completion Status: HBO  Attestation I certify that I supervised this HBO treatment in accordance with Medicare guidelines. A trained Yes emergency response team is readily available per hospital policies and procedures. Continue HBOT as ordered. Yes Electronic Signature(s) Signed: 06/02/2014 4:49:36 PM By: Evlyn KannerBritto, Errol MD, FACS Previous Signature: 06/02/2014 12:26:09 PM Version By: Evlyn KannerBritto, Errol MD, FACS Entered By: Evlyn KannerBritto, Errol on 06/02/2014 13:17:09 Bueso, Raoul [name] ([id number]) -------------------------------------------------------------------------------- HBO Safety Checklist Details Patient Name: [name], [name] Date of Service: 06/02/2014 10:00 AM Medical Record Number: [id number] Patient Account Number: [id number] Date of Birth/Sex: 1920/05/28 [age] y.o. Male) Treating RN: Primary Care Physician: [name], [name] Other Clinician: Referring Physician: [name], [name] Treating Physician/Extender: Rudene ReBritto, Errol Weeks in Treatment: 5 HBO Safety Checklist Items Safety Checklist Consent Form Signed Patient voided / foley secured and emptied When did you last eato 07:00 am Last dose of injectable or oral agent 07:00 am Ostomy pouch emptied and vented if applicable n/a All implantable devices assessed, documented and approved n/a Intravenous access site secured and place PICC line in left arm Valuables secured Linens and cotton and cotton/polyester blend (less than 51% polyester) Personal oil-based products / skin lotions / body lotions removed Wigs or hairpieces removed Smoking or tobacco materials removed Books / newspapers / magazines / loose paper removed Cologne, aftershave, perfume and deodorant removed Jewelry removed (may wrap wedding band) Make-up removed Hair care products removed Battery operated devices (external) removed Heating patches and chemical warmers removed Titanium eyewear removed Nail polish cured greater than 10 hours n/a Casting material cured greater than 10 hours  n/a Hearing aids removed Loose dentures or partials removed Prosthetics have been removed n/a Patient demonstrates correct use of air break device (if applicable) Patient concerns have been addressed Patient grounding bracelet on and cord attached to chamber Specifics for Inpatients (complete in addition to above) Medication sheet sent with patient Intravenous  medications needed or due during therapy sent with patient [name], Jaxzen L. ([id number]) Drainage tubes (e.g. nasogastric tube or chest tube secured and vented) Endotracheal or Tracheotomy tube secured Cuff deflated of air and inflated with saline Airway suctioned Electronic Signature(s) Signed: 06/02/2014 2:38:39 PM By: Dayton MartesWallace, RCP,RRT,CHT, Sallie RCP, RRT, CHT Entered By: Dayton MartesWallace, RCP,RRT,CHT, Sallie on 06/02/2014 12:05:33

## 2014-06-02 NOTE — Progress Notes (Signed)
Rijos, Heith L. ([id number]) Visit Report for 06/02/2014 Arrival Information Details Patient Name: [name], [name] Date of Service: 06/02/2014 10:00 AM Medical Record Number: [id number] Patient Account Number: [id number] Date of Birth/Sex: 1921/01/02 [age] y.o. Male) Treating RN: Primary Care PhysicianSarina Ill: [name], [name] Other Clinician: Izetta DakinWallace, Sallie Referring Physician: [name], [name] Treating Physician/Extender: Rudene ReBritto, Errol Weeks in Treatment: 5 Visit Information History Since Last Visit Added or deleted any medications: No Patient Arrived: Wheel Chair Any new allergies or adverse reactions: No Arrival Time: 10:00 Had a fall or experienced change in No activities of daily living that may affect Accompanied By: Reita ClicheBobby, risk of falls: son Signs or symptoms of abuse/neglect since last No Transfer Assistance: Manual visito Patient Identification Verified: Yes Hospitalized since last visit: No Secondary Verification Process Yes Has Dressing in Place as Prescribed: Yes Completed: Pain Present Now: No Patient Requires Transmission-Based No Precautions: Patient Has Alerts: No Electronic Signature(s) Signed: 06/02/2014 2:38:39 PM By: Dayton MartesWallace, RCP,RRT,CHT, Sallie RCP, RRT, CHT Entered By: Dayton MartesWallace, RCP,RRT,CHT, Sallie on 06/02/2014 12:03:29 [name], [name] ([id number]) -------------------------------------------------------------------------------- Encounter Discharge Information Details Patient Name: [name], [name] Date of Service: 06/02/2014 10:00 AM Medical Record Number: [id number] Patient Account Number: [id number] Date of Birth/Sex: 1921/01/02 [age] y.o. Male) Treating RN: Primary Care Physician: [name], [name] Other Clinician: Referring Physician: [name], [name] Treating Physician/Extender: Rudene ReBritto, Errol Weeks in Treatment: 5 Encounter Discharge Information Items Discharge Pain Level: 0 Discharge Condition: Stable Ambulatory Status: Wheelchair Discharge Destination:  Home Private Transportation: Auto Accompanied By: Reita ClicheBobby, son Schedule Follow-up Appointment: No Medication Reconciliation completed and No provided to Patient/Care Dynasti Kerman: Clinical Summary of Care: Notes Patient has an HBO treatment scheduled on 06/03/14 at 10:00 am. Electronic Signature(s) Signed: 06/02/2014 2:38:39 PM By: Dayton MartesWallace, RCP,RRT,CHT, Sallie RCP, RRT, CHT Entered By: Dayton MartesWallace, RCP,RRT,CHT, Sallie on 06/02/2014 13:17:21 [name], [name] ([id number]) -------------------------------------------------------------------------------- Vitals Details Patient Name: [name], [name] Date of Service: 06/02/2014 10:00 AM Medical Record Number: [id number] Patient Account Number: [id number] Date of Birth/Sex: 1921/01/02 [age] y.o. Male) Treating RN: Primary Care Physician: [name], [name] Other Clinician: Referring Physician: [name], [name] Treating Physician/Extender: Rudene ReBritto, Errol Weeks in Treatment: 5 Vital Signs Time Taken: 09:30 Temperature (F): 98 Height (in): 70 Pulse (bpm): 88 Weight (lbs): 200 Respiratory Rate (breaths/min): 20 Body Mass Index (BMI): 28.7 Blood Pressure (mmHg): 104/53 Capillary Blood Glucose (mg/dl): 536221 Reference Range: 80 - 120 mg / dl Electronic Signature(s) Signed: 06/02/2014 2:38:39 PM By: Dayton MartesWallace, RCP,RRT,CHT, Sallie RCP, RRT, CHT Entered By: Dayton MartesWallace, RCP,RRT,CHT, Sallie on 06/02/2014 12:04:35

## 2014-06-03 ENCOUNTER — Encounter: Payer: Medicare Other | Admitting: Surgery

## 2014-06-03 DIAGNOSIS — L97323 Non-pressure chronic ulcer of left ankle with necrosis of muscle: Secondary | ICD-10-CM | POA: Diagnosis not present

## 2014-06-03 LAB — GLUCOSE, CAPILLARY
GLUCOSE-CAPILLARY: 256 mg/dL — AB (ref 65–99)
GLUCOSE-CAPILLARY: 134 mg/dL — AB (ref 65–99)

## 2014-06-03 NOTE — Progress Notes (Signed)
[name], Issaih L. ([id number]) Visit Report for 06/03/2014 HBO Details Patient Name: [name], [name] Date of Service: 06/03/2014 10:00 AM Medical Record Number: [id number] Patient Account Number: [id number] Date of Birth/Sex: 12/17/1920 [age] y.o. 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 HBO Treatment Details Treatment Number: 5 Patient Type: Outpatient Chamber Type: Monoplace Chamber #: [id number] Treatment Protocol: 2.0 ATA with 90 minutes oxygen, and no air breaks Treatment Details Compression Rate Down: 1.5 psi / minute De-Compression Rate Up: 1.5 psi / minute Air breaks and breathing Compress Tx Pressure Decompress Decompress periods Begins Reached Begins Ends (leave unused spaces blank) Chamber Pressure 1 ATA 2.0 ATA - - - - - - 2.0 ATA 1 ATA Clock Time (24 hr) 10:26 10:38 - - - - - - 12:08 12:19 Treatment Length: 113 (minutes) Treatment Segments: 4 Capillary Blood Glucose Pre Capillary Blood Glucose (mg/dl): Post Capillary Blood Glucose (mg/dl): Vital Signs Capillary Blood Glucose Reference Range: 80 - 120 mg / dl HBO Diabetic Blood Glucose Intervention Range: <131 mg/dl or >295>249 mg/dl Time Vitals Blood Respiratory Capillary Blood Glucose Pulse Action Type: Pulse: Temperature: Taken: Pressure: Rate: Glucose (mg/dl): Meter #: Oximetry (%) Taken: Pre 09:05 100/46 72 20 98 256 1 none Post 12:24 98/48 72 20 98.4 134 1 none Treatment Response Well Mick, Erhardt L. ([id number]) Treatment Toleration: Treatment Treatment Completed without Adverse Event Completion  Status: HBO Attestation I certify that I supervised this HBO treatment in accordance with Medicare guidelines. A trained Yes emergency response team is readily available per hospital policies and procedures. Continue HBOT as ordered. Yes Electronic Signature(s) Signed: 06/03/2014 12:47:20 PM By: Evlyn KannerBritto, Errol MD, FACS Previous Signature: 06/03/2014 9:42:00 AM Version By: Dayton MartesWallace, RCP,RRT,CHT, Sallie RCP, RRT, CHT Entered By: Evlyn KannerBritto, Errol on 06/03/2014 12:46:23 Lie, Raoul [name] ([id number]) -------------------------------------------------------------------------------- HBO Safety Checklist Details Patient Name: [name], [name] Date of Service: 06/03/2014 10:00 AM Medical Record Number: [id number] Patient Account Number: [id number] Date of Birth/Sex: 12/17/1920 [age] y.o. Male) Treating RN: Primary Care Physician: [name], [name] Other Clinician: Izetta DakinWallace, Sallie Referring Physician: [name], [name] Treating Physician/Extender: Rudene ReBritto, Errol Weeks in Treatment: 5 HBO Safety Checklist Items Safety Checklist Consent Form Signed Patient voided / foley secured and emptied When did you last eato 07:00 am Last dose of injectable or oral agent 07:00 am Ostomy pouch emptied and vented if applicable n/a All implantable devices assessed, documented and approved n/a Intravenous access site secured and place PICC line in left arm Valuables secured Linens and cotton and cotton/polyester blend (less than 51% polyester) Personal oil-based products / skin lotions / body lotions removed Wigs or hairpieces removed Smoking or tobacco materials removed Books / newspapers / magazines / loose paper removed Cologne, aftershave, perfume and deodorant removed Jewelry removed (may wrap wedding band) Make-up removed n/a Hair care products removed Battery operated devices (external) removed Heating patches and chemical warmers removed Titanium eyewear removed Nail polish cured greater than 10 hours  n/a Casting material cured greater than 10 hours n/a Hearing aids removed Loose dentures or partials removed Prosthetics have been removed n/a Patient demonstrates correct use of air break device (if applicable) Patient concerns have been addressed Patient grounding bracelet on and cord attached to chamber Specifics for Inpatients (complete  in addition to above) Medication sheet sent with patient Intravenous medications needed or due during therapy sent with patient [name], Danniel L. ([id number]) Drainage tubes (e.g. nasogastric tube or chest tube secured and vented) Endotracheal or Tracheotomy tube secured Cuff deflated of air and inflated with saline Airway suctioned Electronic Signature(s) Signed: 06/03/2014 9:42:00 AM By: Dayton MartesWallace, RCP,RRT,CHT, Sallie RCP, RRT, CHT Entered By: Dayton MartesWallace, RCP,RRT,CHT, Sallie on 06/03/2014 09:16:03

## 2014-06-03 NOTE — Progress Notes (Signed)
FAYSAL, FENOGLIO ([id number]) Visit Report for 06/02/2014 Chief Complaint Document Details Patient Name: Ketchum, Quashaun L. Date of Service: 06/02/2014 9:00 AM Medical Record Number: [id number] Patient Account Number: [id number] Date of Birth/Sex: 1920/09/03 (81 y.o. Male) Treating RN: Primary Care Physician: Lindwood Qua Other Clinician: Referring Physician: Lindwood Qua Treating Physician/Extender: Rudene Re in Treatment: 5 Information Obtained from: Patient Chief Complaint Patient presents to the wound care center for a consult due non healing wound 81 year old gentleman who comes with a history of having some ulcerated areas on his heels since February 2016 and then a large injury to his left posterior heel and ankle since about a month. Electronic Signature(s) Signed: 06/02/2014 12:26:09 PM By: Evlyn Kanner MD, FACS Entered By: Evlyn Kanner on 06/02/2014 10:14:02 Stauffer, [name] ([id number]) -------------------------------------------------------------------------------- Debridement Details Patient Name: Crisco, Keyonta L. Date of Service: 06/02/2014 9:00 AM Medical Record Number: [id number] Patient Account Number: [id number] Date of Birth/Sex: 12-Aug-1920 (81 y.o. Male) Treating RN: Primary Care Physician: Lindwood Qua Other Clinician: Referring Physician: Lindwood Qua Treating Physician/Extender: Rudene Re in Treatment: 5 Debridement Performed for Wound #1 Left Achilles Assessment: Performed By: Physician Tristan Schroeder., MD Debridement: Debridement Pre-procedure Yes Verification/Time Out Taken: Start Time: 09:38 Pain Control: Lidocaine 4% Topical Solution Level: Skin/Subcutaneous Tissue Total Area Debrided (L x 5 (cm) x 2 (cm) = 10 (cm) W): Tissue and other Viable, Non-Viable, Exudate, Fibrin/Slough, Ligament, Skin, Subcutaneous material debrided: Instrument: Forceps, Scissors Bleeding: Minimum Hemostasis Achieved: Pressure End Time:  09:46 Procedural Pain: 0 Post Procedural Pain: 0 Response to Treatment: Procedure was tolerated well Post Debridement Measurements of Total Wound Length: (cm) 6.5 Width: (cm) 4.2 Depth: (cm) 0.8 Volume: (cm) 17.153 Electronic Signature(s) Signed: 06/02/2014 12:26:09 PM By: Evlyn Kanner MD, FACS Entered By: Evlyn Kanner on 06/02/2014 10:12:21 Denson, Shaquon Elbert Ewings ([id number]) -------------------------------------------------------------------------------- Debridement Details Patient Name: Dansereau, Karla L. Date of Service: 06/02/2014 9:00 AM Medical Record Number: [id number] Patient Account Number: [id number] Date of Birth/Sex: 01-Sep-1920 (81 y.o. Male) Treating RN: Primary Care Physician: Lindwood Qua Other Clinician: Referring Physician: Lindwood Qua Treating Physician/Extender: Rudene Re in Treatment: 5 Debridement Performed for Wound #2 Left,Lateral Calcaneous Assessment: Performed By: Physician Tristan Schroeder., MD Debridement: Debridement Pre-procedure Yes Verification/Time Out Taken: Start Time: 09:46 Pain Control: Lidocaine 4% Topical Solution Level: Skin/Subcutaneous Tissue Total Area Debrided (L x 0.8 (cm) x 3.6 (cm) = 2.88 (cm) W): Tissue and other Viable, Non-Viable, Eschar, Fibrin/Slough, Skin, Subcutaneous material debrided: Instrument: Forceps, Scissors Bleeding: Minimum Hemostasis Achieved: Silver Nitrate End Time: 09:48 Procedural Pain: 0 Post Procedural Pain: 0 Response to Treatment: Procedure was tolerated well Post Debridement Measurements of Total Wound Length: (cm) 0.8 Width: (cm) 3.6 Depth: (cm) 0.1 Volume: (cm) 0.226 Electronic Signature(s) Signed: 06/02/2014 12:26:09 PM By: Evlyn Kanner MD, FACS Entered By: Evlyn Kanner on 06/02/2014 10:12:58 Nyborg, Vannak Elbert Ewings ([id number]) -------------------------------------------------------------------------------- Debridement Details Patient Name: Asman, Farhad L. Date of Service:  06/02/2014 9:00 AM Medical Record Number: [id number] Patient Account Number: [id number] Date of Birth/Sex: 08/25/1920 (81 y.o. Male) Treating RN: Primary Care Physician: Lindwood Qua Other Clinician: Referring Physician: Lindwood Qua Treating Physician/Extender: Rudene Re in Treatment: 5 Debridement Performed for Wound #3 Left Toe Great Assessment: Performed By: Physician Tristan Schroeder., MD Debridement: Debridement Pre-procedure Yes Verification/Time Out Taken: Start Time: 09:34 Pain Control: Lidocaine 4% Topical Solution Level: Skin/Subcutaneous Tissue Total Area Debrided (L x 1.2 (cm) x 1.2 (cm) = 1.44 (cm) W): Tissue and other Viable, Non-Viable, Callus, Fibrin/Slough, Other, Skin, Subcutaneous material debrided: Instrument: Forceps,  Scissors Bleeding: Minimum Hemostasis Achieved: Pressure End Time: 09:38 Procedural Pain: 0 Post Procedural Pain: 0 Response to Treatment: Procedure was tolerated well Post Debridement Measurements of Total Wound Length: (cm) 2.7 Width: (cm) 1 Depth: (cm) 0.3 Volume: (cm) 0.636 Electronic Signature(s) Signed: 06/02/2014 12:26:09 PM By: Evlyn KannerBritto, Giordan Fordham MD, FACS Entered By: Evlyn KannerBritto, Gayatri Teasdale on 06/02/2014 10:13:49 Moquin, Maxx Elbert EwingsL. ([id number]) -------------------------------------------------------------------------------- HPI Details Patient Name: Gamel, Tekoa L. Date of Service: 06/02/2014 9:00 AM Medical Record Number: [id number] Patient Account Number: [id number] Date of Birth/Sex: December 02, 1920 [age] y.o. Male) Treating RN: Primary Care Physician: [name], [name] Other Clinician: Referring Physician: [name], [name] Treating Physician/Extender: Rudene ReBritto, Kyrah Schiro Weeks in Treatment: 5 History of Present Illness HPI Description: 81 year old gentleman who was known to be a diabetic for many years has peripheral neuropathy recently went to his primary care doctor for a punctured wound on his left heel which was something he had  noticed. The patient then was referred to a podiatrist who referred him to Dr. Wyn Quakerew in the vascular surgery department and I understand the procedure was done on 03/14/2014 with a left lower extremity vascular procedure and stenting was done. Details of this are not available at the present time. The patient has been applying Neosporin to his leg and has not had any wound care addressed so far. patient has also had a history of coronary artery disease in the past and hasn't had a CABG and a pacemaker placement in the remote past.Other details and notes are pending. the patient is not in pain lives alone and has some home health and other aides coming to help him with his daily chores and meals. reviewing the vascular notes I understand the procedure was done on 03/14/2014 and he was operated by Dr. Wyn QuakerEW. he had a catheter placement to the left peroneal artery and a aortogram and selective left lower extremity angiogram was done. He also had a percutaneous transluminal angioplasty of the left peroneal artery and the tibioperoneal trunk. The distal SFA and above-knee popliteal artery were also angioplastied. A subcutaneous stent placement to the distal superficial femoral artery was also done. 05/05/2014 -- the patient has not had any change in his health and after much consideration is decided that he does not want HBOT as he is claustrophobic and was unable to tolerate being in the chamber for 90 minutes. I have discussed with him that his most recent x-ray of the foot shows that he has the distal phalanx of the left great toe showing changes with osteomyelitis cannot be excluded at that site. He tells me that he cannot have an MRI because of his defibrillator and hence we will order a triple phase bone scan. I have also reviewed his culture report which shows several organisms and he has sensitivity to tetracycline and we have recommended he takes this for 14 days and this has been given to him on  05/02/2014 05/12/2014 the bone scan done on 05/10/2014 shows #1 findings are worrisome for osteomyelitis involving the calcaneus of the left foot, #2 increase uptake localizing to the left second and third toe on all 3 phases. Cannot rule out osteomyelitis in this area. And #3 left foot cellulitis. 05/19/2014 -- reviewed several reports which we have received back on [facility]. #1 chest x-ray done on April 21 shows chronic changes in the left base no acute findings. #2 his CBC is within normal limits. #3 hemoglobin A1c was 8.5%. #4 EKG done on 26 April was within normal limits due to his electronic ventricular  pacemaker. 05/26/2014 -- he has had his PICC line placed and is taking vancomycin daily basis. He is to start hyperbaric oxygen therapy on this coming Monday. AUNDRA, ESPIN ([id number]) Electronic Signature(s) Signed: 06/02/2014 12:26:09 PM By: Evlyn Kanner MD, FACS Entered By: Evlyn Kanner on 06/02/2014 10:14:11 Gunnerson, [name] ([id number]) -------------------------------------------------------------------------------- Physical Exam Details Patient Name: Sarria, Rambo L. Date of Service: 06/02/2014 9:00 AM Medical Record Number: [id number] Patient Account Number: [id number] Date of Birth/Sex: 17-Jul-1920 (81 y.o. Male) Treating RN: Primary Care Physician: Lindwood Qua Other Clinician: Referring Physician: Lindwood Qua Treating Physician/Extender: Rudene Re in Treatment: 5 Constitutional . Pulse regular. Respirations normal and unlabored. Afebrile. . Eyes Nonicteric. Reactive to light. Ears, Nose, Mouth, and Throat Lips, teeth, and gums WNL.Marland Kitchen Moist mucosa without lesions . Neck supple and nontender. No palpable supraclavicular or cervical adenopathy. Normal sized without goiter. Respiratory WNL. No retractions.. Cardiovascular Pedal Pulses WNL. No clubbing, cyanosis or edema. Integumentary (Hair, Skin) on the left big toe area there is a amount of  eschar and the nailbed which will be sharply removed today. His left Achilles tendon has a lot of debris and including some part of the ligament which needs to be sharply debrided. Lateral calcaneal area has callus and some debris and some subcutaneous tissue which will be sharply debrided.. No crepitus or fluctuance. No peri-wound warmth or erythema. No masses.Marland Kitchen Psychiatric Judgement and insight Intact.. No evidence of depression, anxiety, or agitation.. Electronic Signature(s) Signed: 06/02/2014 12:26:09 PM By: Evlyn Kanner MD, FACS Entered By: Evlyn Kanner on 06/02/2014 10:15:37 Barajas, [name] ([id number]) -------------------------------------------------------------------------------- Physician Orders Details Patient Name: Dockter, Harshan L. Date of Service: 06/02/2014 9:00 AM Medical Record Number: [id number] Patient Account Number: [id number] Date of Birth/Sex: 1920-12-07 (81 y.o. Male) Treating RN: Curtis Sites Primary Care Physician: Lindwood Qua Other Clinician: Referring Physician: Lindwood Qua Treating Physician/Extender: Rudene Re in Treatment: 5 Verbal / Phone Orders: Yes Clinician: Curtis Sites Read Back and Verified: Yes Diagnosis Coding Wound Cleansing Wound #1 Left Achilles o Clean wound with Normal Saline. o May shower with protection. Wound #2 Left,Lateral Calcaneous o Clean wound with Normal Saline. o May shower with protection. Wound #3 Left Toe Great o Clean wound with Normal Saline. o May shower with protection. Anesthetic Wound #1 Left Achilles o Topical Lidocaine 4% cream applied to wound bed prior to debridement Wound #2 Left,Lateral Calcaneous o Topical Lidocaine 4% cream applied to wound bed prior to debridement Wound #3 Left Toe Great o Topical Lidocaine 4% cream applied to wound bed prior to debridement Skin Barriers/Peri-Wound Care Wound #1 Left Achilles o Skin Prep Wound #2 Left,Lateral Calcaneous o  Skin Prep Wound #3 Left Toe Great o Skin Prep Primary Wound Dressing Wound #1 Left Achilles o Santyl Ointment Hepp, Kaelen L. ([id number]) Wound #2 Left,Lateral Calcaneous o Aquacel Ag Wound #3 Left Toe Great o Aquacel Ag Secondary Dressing Wound #1 Left Achilles o Gauze, ABD and Kerlix/Conform Wound #2 Left,Lateral Calcaneous o Gauze, ABD and Kerlix/Conform Wound #3 Left Toe Great o Gauze, ABD and Kerlix/Conform Dressing Change Frequency Wound #1 Left Achilles o Change dressing every day. Wound #2 Left,Lateral Calcaneous o Change dressing every day. Wound #3 Left Toe Great o Change dressing every day. Follow-up Appointments Wound #1 Left Achilles o Return Appointment in 1 week. Wound #2 Left,Lateral Calcaneous o Return Appointment in 1 week. Wound #3 Left Toe Great o Return Appointment in 1 week. Additional Orders / Instructions Wound #1 Left Achilles o Increase protein intake. o Other: -  goal is to keep blood sugars under 180 Wound #2 Left,Lateral Calcaneous o Increase protein intake. o Other: - goal is to keep blood sugars under 180 Wound #3 Left Toe Great o Increase protein intake. [name], Lannis L. ([id number]) o Other: - goal is to keep blood sugars under 180 Home Health Wound #1 Left Achilles o Continue Home Health Visits - [facility] o Home Health Nurse may visit PRN to address patientos wound care needs. o FACE TO FACE ENCOUNTER: MEDICARE and MEDICAID PATIENTS: I certify that this patient is under my care and that I had a face-to-face encounter that meets the physician face-to-face encounter requirements with this patient on this date. The encounter with the patient was in whole or in part for the following MEDICAL CONDITION: (primary reason for Home Healthcare) MEDICAL NECESSITY: I certify, that based on my findings, NURSING services are a medically necessary home health service. HOME BOUND STATUS: I  certify that my clinical findings support that this patient is homebound (i.e., Due to illness or injury, pt requires aid of supportive devices such as crutches, cane, wheelchairs, walkers, the use of special transportation or the assistance of another person to leave their place of residence. There is a normal inability to leave the home and doing so requires considerable and taxing effort. Other absences are for medical reasons / religious services and are infrequent or of short duration when for other reasons). o If current dressing causes regression in wound condition, may D/C ordered dressing product/s and apply Normal Saline Moist Dressing daily until next Wound Healing Center / Other MD appointment. Notify Wound Healing Center of regression in wound condition at [phone]. o Please direct any NON-WOUND related issues/requests for orders to patient's Primary Care Physician Wound #2 Left,Lateral Calcaneous o [facility] o Home Health Nurse may visit PRN to address patientos wound care needs. o FACE TO FACE ENCOUNTER: MEDICARE and MEDICAID PATIENTS: I certify that this patient is under my care and that I had a face-to-face encounter that meets the physician face-to-face encounter requirements with this patient on this date. The encounter with the patient was in whole or in part for the following MEDICAL CONDITION: (primary reason for Home Healthcare) MEDICAL NECESSITY: I certify, that based on my findings, NURSING services are a medically necessary home health service. HOME BOUND STATUS: I certify that my clinical findings support that this patient is homebound (i.e., Due to illness or injury, pt requires aid of supportive devices such as crutches, cane, wheelchairs, walkers, the use of special transportation or the assistance of another person to leave their place of residence. There is a normal inability to leave the home and doing so  requires considerable and taxing effort. Other absences are for medical reasons / religious services and are infrequent or of short duration when for other reasons). o If current dressing causes regression in wound condition, may D/C ordered dressing product/s and apply Normal Saline Moist Dressing daily until next Wound Healing Center / Other MD appointment. Notify Wound Healing Center of regression in wound condition at [phone]. o Please direct any NON-WOUND related issues/requests for orders to patient's Primary Care Physician Wound #3 Left Toe Doretha SouGreat o Continue Home Health Visits - [facility] o Home Health Nurse may visit PRN to address patientos wound care needs. [name], Damier L. ([id number]) o FACE TO FACE ENCOUNTER: MEDICARE and MEDICAID PATIENTS: I certify that this patient is under my care and that I had a face-to-face  encounter that meets the physician face-to-face encounter requirements with this patient on this date. The encounter with the patient was in whole or in part for the following MEDICAL CONDITION: (primary reason for Home Healthcare) MEDICAL NECESSITY: I certify, that based on my findings, NURSING services are a medically necessary home health service. HOME BOUND STATUS: I certify that my clinical findings support that this patient is homebound (i.e., Due to illness or injury, pt requires aid of supportive devices such as crutches, cane, wheelchairs, walkers, the use of special transportation or the assistance of another person to leave their place of residence. There is a normal inability to leave the home and doing so requires considerable and taxing effort. Other absences are for medical reasons / religious services and are infrequent or of short duration when for other reasons). o If current dressing causes regression in wound condition, may D/C ordered dressing product/s and apply Normal Saline Moist Dressing daily until next Wound Healing  Center / Other MD appointment. Notify Wound Healing Center of regression in wound condition at [phone]. o Please direct any NON-WOUND related issues/requests for orders to patient's Primary Care Physician Hyperbaric Oxygen Therapy Wound #3 Left Toe Great o Indication: - osteomyelitis o If appropriate for treatment, begin HBOT per protocol: o 2.0 ATA for 90 Minutes without Air Breaks o One treatment per day (delivered Monday through Friday unless otherwise specified in Special Instructions below): o Total # of Treatments: - 40 o Finger stick Blood Glucose Pre- and Post- HBOT Treatment. o Follow Hyperbaric Oxygen Glycemia Protocol HBO Contraindications Wound #3 Left Toe Great - Left Lower Extremity o HBO contraindications of hyperbaric oxygen therapy were reviewed and the patient found to have no untreated pneumothorax or history of spontaneous pneumothorax. o HBO contraindications of hyperbaric oxygen therapy were reviewed and the patient found to have no history of medications such as Bleomycin, Adriamycin, disulfiram, cisplatin and sulfamylon and is not currently receiving any chemotherapy. o HBO contraindications of hyperbaric oxygen therapy were reviewed and the patient found to have no Upper respiratory infection and chronic sinusitis. o HBO contraindications of hyperbaric oxygen therapy were reviewed and the patient found to have no history of retinal surgery proceeding 6 weeks or intraocular gas o HBO contraindications of hyperbaric oxygen therapy were reviewed and the patient found to have no history seizure disorder or any anticonvulsant medication. o HBO contraindications of hyperbaric oxygen therapy were reviewed and the patient found to have no septicemia with CO2 retention. o HBO contraindications of hyperbaric oxygen therapy were reviewed and the patient found to have no fever greater than 100 degrees. o HBO contraindications of  hyperbaric oxygen therapy were reviewed and the patient found to have no pregnancy noted. Brockmann, Marquise L. ([id number]) o HBO contraindications of hyperbaric oxygen therapy were reviewed and the patient found to have no medications such as steroids or narcotics or Phenergan. Medications-please add to medication list. Wound #1 Left Achilles - Left Lower Extremity o Santyl Enzymatic Ointment Wound #2 Left,Lateral Calcaneous - Left Lower Extremity o Santyl Enzymatic Ointment GLYCEMIA INTERVENTIONS PROTOCOL PRE-HBO GLYCEMIA INTERVENTIONS ACTION INTERVENTION Obtain pre-HBO capillary blood 1 glucose (ensure physician order is in chart). A. Notify HBO physician and await physician orders. 2 If result is 70 mg/dl or below: B. If the result meets the hospital definition of a critical result, follow hospital policy. A. Give patient an 8 ounce Glucerna Shake, an 8 ounce Ensure, or 8 ounces of a Glucerna/Ensure equivalent dietary supplement*. B. Wait 30 minutes. If result is  71 mg/dl to 960 mg/dl: C. Retest patientos capillary blood glucose (CBG). D. If result greater than or equal to 110 mg/dl, proceed with HBO. If result less than 110 mg/dl, notify HBO physician and consider holding HBO. If result is 131 mg/dl to 454 mg/dl: A. Proceed with HBO. A. Notify HBO physician and await physician orders. B. It is recommended to hold HBO and do blood/urine ketone If result is 250 mg/dl or greater: testing. C. If the result meets the hospital definition of a critical result, follow hospital policy. POST-HBO GLYCEMIA INTERVENTIONS ACTION INTERVENTION Obtain post HBO capillary blood 1 glucose (ensure physician order is in chart). Crisafulli, Huntley L. ([id number]) 2 If result is 70 mg/dl or below: A. Notify HBO physician and await physician orders. B. If the result meets the hospital definition of a critical result, follow hospital policy. A. Give patient an 8 ounce Glucerna  Shake, an 8 ounce Ensure, or 8 ounces of a Glucerna/Ensure equivalent dietary supplement*. B. Wait 15 minutes for symptoms of hypoglycemia (i.e. nervousness, anxiety, If result is 71 mg/dl to 829 mg/dl: sweating, chills, clamminess, irritability, confusion, tachycardia or dizziness). C. If patient asymptomatic, discharge patient. If patient symptomatic, repeat capillary blood glucose (CBG) and notify HBO physician. If result is 101 mg/dl to 562 mg/dl: A. Discharge patient. A. Notify HBO physician and await physician orders. B. It is recommended to do If result is 250 mg/dl or greater: blood/urine ketone testing. C. If the result meets the hospital definition of a critical result, follow hospital policy. *Juice or candies are NOT equivalent products. If patient refuses the Glucerna or Ensure, please consult the hospital dietitian for an appropriate substitute. Electronic Signature(s) Signed: 06/02/2014 12:26:09 PM By: Evlyn Kanner MD, FACS Signed: 06/02/2014 5:34:11 PM By: Curtis Sites Entered By: Curtis Sites on 06/02/2014 09:49:17 Tippens, [name] ([id number]) -------------------------------------------------------------------------------- Problem List Details Patient Name: Weinheimer, Boniface L. Date of Service: 06/02/2014 9:00 AM Medical Record Number: [id number] Patient Account Number: [id number] Date of Birth/Sex: [birth year] (81 y.o. 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 Date of Service: 06/02/2014 9:00 AM Medical Record Number: [id number] Patient Account Number: [id number] Date of Birth/Sex: November 20, 1920 (81 y.o. Male) Treating RN: Primary Care Physician: Lindwood Qua Other Clinician: Referring Physician: Lindwood Qua Treating Physician/Extender: Rudene Re in Treatment: 5 Subjective Chief Complaint Information obtained from Patient Patient presents to the wound care center for a consult due non healing wound 81 year old gentleman who comes with a history of having some ulcerated areas on his heels since February 2016 and then a large injury to his left posterior heel and ankle since about a month. History of Present Illness (HPI) 81 year old gentleman who was known to be a diabetic for many years has peripheral neuropathy recently went to his primary care doctor for a punctured wound  on his left heel which was something he had noticed. The patient then was referred to a podiatrist who referred him to Dr. Wyn Quaker in the vascular surgery department and I understand the procedure was done on 03/14/2014 with a left lower extremity vascular procedure and stenting was done. Details of this are not available at the present time. The patient has been applying Neosporin to his leg and has not had any wound care addressed so far. patient has also had a history of coronary artery disease in the past and hasn't had a CABG and a pacemaker placement in the remote past.Other details and notes are  pending. the patient is not in pain lives alone and has some home health and other aides coming to help him with his daily chores and meals. reviewing the vascular notes I understand the procedure was done on 03/14/2014 and he was operated by Dr. Wyn Quaker. he had a catheter placement to the left peroneal artery and a aortogram and selective left lower extremity angiogram was done. He also had a percutaneous transluminal angioplasty of the left peroneal artery and the tibioperoneal trunk. The distal SFA and above-knee popliteal artery were also angioplastied. A subcutaneous stent placement to the distal superficial femoral artery was also done. 05/05/2014 -- the patient has not had any change in his health and after much consideration is decided that he does not want HBOT as he is claustrophobic and was unable to tolerate being in the chamber for 90 minutes. I have discussed with him that his most recent x-ray of the foot shows that he has the distal phalanx of the left great toe showing changes with osteomyelitis cannot be excluded at that site. He tells me that he cannot have an MRI because of his defibrillator and hence we will order a triple phase bone scan. I have also reviewed his culture report which shows several organisms and he has sensitivity to tetracycline and we have recommended he takes this for 14 days and this has been given to him on 05/02/2014 05/12/2014 the bone scan done on 05/10/2014 shows #1 findings are worrisome for osteomyelitis involving the calcaneus of the left foot, #2 increase uptake localizing to the left second and third toe on all 3 phases. Cannot rule out osteomyelitis in this area. And #3 left foot cellulitis. 05/19/2014 -- reviewed several reports which we have received back on Everrett Coombe. #1 chest x-ray done on April 21 shows chronic changes in the left base no acute findings. Crook, Darryll L. ([id number]) #2 his CBC is within normal limits. #3 hemoglobin A1c  was 8.5%. #4 EKG done on 26 April was within normal limits due to his electronic ventricular pacemaker. 05/26/2014 -- he has had his PICC line placed and is taking vancomycin daily basis. He is to start hyperbaric oxygen therapy on this coming Monday. Objective Constitutional Pulse regular. Respirations normal and unlabored. Afebrile. Vitals Time Taken: 9:05 AM, Height: 70 in, Weight: 200 lbs, BMI: 28.7, Temperature: 98 F, Pulse: 88 bpm, Respiratory Rate: 20 breaths/min, Blood Pressure: 104/53 mmHg. General Notes: pt reports BS 150s Eyes Nonicteric. Reactive to light. Ears, Nose, Mouth, and Throat Lips, teeth, and gums WNL.Marland Kitchen Moist mucosa without lesions . Neck supple and nontender. No palpable supraclavicular or cervical adenopathy. Normal sized without goiter. Respiratory WNL. No retractions.. Cardiovascular Pedal Pulses WNL. No clubbing, cyanosis or edema. Psychiatric Judgement and insight Intact.. No evidence of depression, anxiety, or agitation.. Integumentary (Hair, Skin) on the left big toe area there is a amount  of eschar and the nailbed which will be sharply removed today. His left Achilles tendon has a lot of debris and including some part of the ligament which needs to be sharply debrided. Lateral calcaneal area has callus and some debris and some subcutaneous tissue which will be sharply debrided.. No crepitus or fluctuance. No peri-wound warmth or erythema. No masses.. Wound #1 status is Open. Original cause of wound was Gradually Appeared. The wound is located on the Left Achilles. The wound measures 6.5cm length x 4.2cm width x 0.8cm depth; 21.441cm^2 area and Poellnitz, Everhett L. ([id number]) 17.153cm^3 volume. There is tendon exposed. There is no tunneling or undermining noted. There is a medium amount of serous drainage noted. The wound margin is well defined and not attached to the wound base. There is small (1-33%) pink granulation within the wound bed. There is a  large (67-100%) amount of necrotic tissue within the wound bed including Eschar and Adherent Slough. The periwound skin appearance exhibited: Rubor, Erythema. The periwound skin appearance did not exhibit: Callus, Crepitus, Excoriation, Fluctuance, Friable, Induration, Localized Edema, Rash, Scarring, Dry/Scaly, Maceration, Moist, Atrophie Blanche, Cyanosis, Ecchymosis, Hemosiderin Staining, Mottled, Pallor. The surrounding wound skin color is noted with erythema which is circumferential. Periwound temperature was noted as No Abnormality. Wound #2 status is Open. Original cause of wound was Gradually Appeared. The wound is located on the Left,Lateral Calcaneous. The wound measures 0.8cm length x 3.6cm width x 0.1cm depth; 2.262cm^2 area and 0.226cm^3 volume. The wound is limited to skin breakdown. There is a small amount of serous drainage noted. The wound margin is distinct with the outline attached to the wound base. There is small (1-33%) pink granulation within the wound bed. There is a medium (34-66%) amount of necrotic tissue within the wound bed including Adherent Slough. The periwound skin appearance did not exhibit: Callus, Crepitus, Excoriation, Fluctuance, Friable, Induration, Localized Edema, Rash, Scarring, Dry/Scaly, Maceration, Moist, Atrophie Blanche, Cyanosis, Ecchymosis, Hemosiderin Staining, Mottled, Pallor, Rubor, Erythema. Periwound temperature was noted as No Abnormality. The periwound has tenderness on palpation. Wound #3 status is Open. Original cause of wound was Gradually Appeared. The wound is located on the Left Toe Great. The wound measures 1.2cm length x 1.2cm width x 0.1cm depth; 1.131cm^2 area and 0.113cm^3 volume. The wound is limited to skin breakdown. There is no tunneling or undermining noted. There is a none present amount of drainage noted. The wound margin is distinct with the outline attached to the wound base. There is no granulation within the wound bed.  There is a large (67-100%) amount of necrotic tissue within the wound bed including Eschar. The periwound skin appearance exhibited: Erythema. The periwound skin appearance did not exhibit: Callus, Crepitus, Excoriation, Fluctuance, Friable, Induration, Localized Edema, Rash, Scarring, Dry/Scaly, Maceration, Moist, Atrophie Blanche, Cyanosis, Ecchymosis, Hemosiderin Staining, Mottled, Pallor, Rubor. The surrounding wound skin color is noted with erythema which is circumferential. Periwound temperature was noted as No Abnormality. on the left big toe area there is a amount of eschar and the nailbed which will be sharply removed today. His left Achilles tendon has a lot of debris and including some part of the ligament which needs to be sharply debrided. Lateral calcaneal area has callus and some debris and some subcutaneous tissue which will be sharply debrided. 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 ([id number]) M86.372 - Chronic multifocal osteomyelitis, left ankle and foot after sharply debriding all the structures which needed to come out as noted in my detailed note I have recommended that we continue with Santyl on the area of the Achilles heel. The other areas will be packed with silver alginate and a dressing was applied. All the wounds are doing very much better after his continued hyperbaric oxygen therapy and we will also continue his IV antibiotics as per Dr. Sampson Goon. See me on a regular basis and will also follow-up with infectious disease for his routine care. 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 There was a  Skin/Subcutaneous Tissue Debridement ([id number]) debridement with total area of 10 sq cm performed by Joey Hudock, Ignacia Felling., MD. with the following instrument(s): Forceps and Scissors to remove Viable and Non-Viable tissue/material including Exudate, Fibrin/Slough, Ligament, Skin, and Subcutaneous after achieving pain control using Lidocaine 4% Topical Solution. A time out was conducted prior to the start of the procedure. A Minimum amount of bleeding was controlled with Pressure. The procedure was tolerated well with a pain level of 0 throughout and a pain level of 0 following the procedure. Post Debridement Measurements: 6.5cm length x 4.2cm width x 0.8cm depth; 17.153cm^3 volume. Wound #2 Wound #2 is an Arterial Insufficiency Ulcer located on the Left,Lateral Calcaneous . There was a Skin/Subcutaneous Tissue Debridement ([id number]) debridement with total area of 2.88 sq cm performed by Rafay Dahan, Ignacia Felling., MD. with the following instrument(s): Forceps and Scissors to remove Viable and Non-Viable tissue/material including Fibrin/Slough, Eschar, Skin, and Subcutaneous after achieving pain control using Lidocaine 4% Topical Solution. A time out was conducted prior to the start of the procedure. A Minimum amount of bleeding was controlled with Silver Nitrate. The procedure was tolerated well with a pain level of 0 throughout and a pain level of 0 following the procedure. Post Debridement Measurements: 0.8cm length x 3.6cm width x 0.1cm depth; 0.226cm^3 volume. Wound #3 Wound #3 is an Arterial Insufficiency Ulcer located on the Left Toe Great . There was a Skin/Subcutaneous Tissue Debridement ([id number]) debridement with total area of 1.44 sq cm performed by Dionisia Pacholski, Ignacia Felling., MD. with the following instrument(s): Forceps and Scissors to remove Viable and Non-Viable tissue/material including Fibrin/Slough, Other, Skin, Callus, and Subcutaneous after achieving pain control using Lidocaine 4% Topical  Solution. A time out was conducted prior to the start of the procedure. A Minimum amount of bleeding was controlled with Pressure. The procedure was tolerated well with a pain level of 0 throughout and a pain level of 0 following the procedure. Post Debridement Measurements: 2.7cm length x 1cm width x 0.3cm depth; 0.636cm^3 volume. Vandevender, Bell L. ([id number]) Plan Wound Cleansing: Wound #1 Left Achilles: Clean wound with Normal Saline. May shower with protection. Wound #2 Left,Lateral Calcaneous: Clean wound with Normal Saline. May shower with protection. Wound #3 Left Toe Great: Clean wound with Normal Saline. May shower with protection. Anesthetic: Wound #1 Left Achilles: Topical Lidocaine 4% cream applied to wound bed prior to debridement Wound #2 Left,Lateral Calcaneous: Topical Lidocaine 4% cream applied to wound bed prior to debridement Wound #3 Left Toe Great: Topical Lidocaine 4% cream applied to wound bed prior to debridement Skin Barriers/Peri-Wound Care: Wound #1 Left Achilles: Skin Prep Wound #2 Left,Lateral Calcaneous: Skin Prep Wound #3 Left Toe Great: Skin Prep Primary Wound Dressing: Wound #  1 Left Achilles: Santyl Ointment Wound #2 Left,Lateral Calcaneous: Aquacel Ag Wound #3 Left Toe Great: Aquacel Ag Secondary Dressing: Wound #1 Left Achilles: Gauze, ABD and Kerlix/Conform Wound #2 Left,Lateral Calcaneous: Gauze, ABD and Kerlix/Conform Wound #3 Left Toe Great: Gauze, ABD and Kerlix/Conform Dressing Change Frequency: Wound #1 Left Achilles: Change dressing every day. Wound #2 Left,Lateral Calcaneous: Change dressing every day. Wound #3 Left Toe Great: Change dressing every day. MYRICK, MCNAIRY ([id number]) Follow-up Appointments: Wound #1 Left Achilles: Return Appointment in 1 week. Wound #2 Left,Lateral Calcaneous: Return Appointment in 1 week. Wound #3 Left Toe Great: Return Appointment in 1 week. Additional Orders / Instructions: Wound  #1 Left Achilles: Increase protein intake. Other: - goal is to keep blood sugars under 180 Wound #2 Left,Lateral Calcaneous: Increase protein intake. Other: - goal is to keep blood sugars under 180 Wound #3 Left Toe Great: Increase protein intake. Other: - goal is to keep blood sugars under 180 Home Health: Wound #1 Left Achilles: Continue Home Health Visits - [facility] Nurse may visit PRN to address patient s wound care needs. FACE TO FACE ENCOUNTER: MEDICARE and MEDICAID PATIENTS: I certify that this patient is under my care and that I had a face-to-face encounter that meets the physician face-to-face encounter requirements with this patient on this date. The encounter with the patient was in whole or in part for the following MEDICAL CONDITION: (primary reason for Home Healthcare) MEDICAL NECESSITY: I certify, that based on my findings, NURSING services are a medically necessary home health service. HOME BOUND STATUS: I certify that my clinical findings support that this patient is homebound (i.e., Due to illness or injury, pt requires aid of supportive devices such as crutches, cane, wheelchairs, walkers, the use of special transportation or the assistance of another person to leave their place of residence. There is a normal inability to leave the home and doing so requires considerable and taxing effort. Other absences are for medical reasons / religious services and are infrequent or of short duration when for other reasons). If current dressing causes regression in wound condition, may D/C ordered dressing product/s and apply Normal Saline Moist Dressing daily until next Wound Healing Center / Other MD appointment. Notify Wound Healing Center of regression in wound condition at [phone]. Please direct any NON-WOUND related issues/requests for orders to patient's Primary Care Physician Wound #2 Left,Lateral Calcaneous: Continue Home Health Visits - [facility] Nurse may visit PRN to address patient s wound care needs. FACE TO FACE ENCOUNTER: MEDICARE and MEDICAID PATIENTS: I certify that this patient is under my care and that I had a face-to-face encounter that meets the physician face-to-face encounter requirements with this patient on this date. The encounter with the patient was in whole or in part for the following MEDICAL CONDITION: (primary reason for Home Healthcare) MEDICAL NECESSITY: I certify, that based on my findings, NURSING services are a medically necessary home health service. HOME BOUND STATUS: I certify that my clinical findings support that this patient is homebound (i.e., Due to illness or injury, pt requires aid of supportive devices such as crutches, cane, wheelchairs, walkers, the use of special transportation or the assistance of another person to leave their place of residence. There is a normal inability to leave the home and doing so requires considerable and taxing effort. Other absences are for medical reasons / religious services and are infrequent or of short duration when for other reasons). If current dressing causes  regression in wound condition, may D/C ordered dressing product/s and apply Normal Saline Moist Dressing daily until next Wound Healing Center / Other MD appointment. Notify Wound Healing Center of regression in wound condition at [phone]. KEYVON, HERTER ([id number]) Please direct any NON-WOUND related issues/requests for orders to patient's Primary Care Physician Wound #3 Left Toe Great: Continue Home Health Visits - [facility] Nurse may visit PRN to address patient s wound care needs. FACE TO FACE ENCOUNTER: MEDICARE and MEDICAID PATIENTS: I certify that this patient is under my care and that I had a face-to-face encounter that meets the physician face-to-face encounter requirements with this patient on this date. The encounter with the patient was in  whole or in part for the following MEDICAL CONDITION: (primary reason for Home Healthcare) MEDICAL NECESSITY: I certify, that based on my findings, NURSING services are a medically necessary home health service. HOME BOUND STATUS: I certify that my clinical findings support that this patient is homebound (i.e., Due to illness or injury, pt requires aid of supportive devices such as crutches, cane, wheelchairs, walkers, the use of special transportation or the assistance of another person to leave their place of residence. There is a normal inability to leave the home and doing so requires considerable and taxing effort. Other absences are for medical reasons / religious services and are infrequent or of short duration when for other reasons). If current dressing causes regression in wound condition, may D/C ordered dressing product/s and apply Normal Saline Moist Dressing daily until next Wound Healing Center / Other MD appointment. Notify Wound Healing Center of regression in wound condition at [phone]. Please direct any NON-WOUND related issues/requests for orders to patient's Primary Care Physician Hyperbaric Oxygen Therapy: Wound #3 Left Toe Great: Indication: - osteomyelitis If appropriate for treatment, begin HBOT per protocol: 2.0 ATA for 90 Minutes without Air Breaks One treatment per day (delivered Monday through Friday unless otherwise specified in Special Instructions below): Total # of Treatments: - 40 Finger stick Blood Glucose Pre- and Post- HBOT Treatment. Follow Hyperbaric Oxygen Glycemia Protocol HBO Contraindications: Wound #3 Left Toe Great: HBO contraindications of hyperbaric oxygen therapy were reviewed and the patient found to have no untreated pneumothorax or history of spontaneous pneumothorax. HBO contraindications of hyperbaric oxygen therapy were reviewed and the patient found to have no history of medications such as Bleomycin, Adriamycin, disulfiram,  cisplatin and sulfamylon and is not currently receiving any chemotherapy. HBO contraindications of hyperbaric oxygen therapy were reviewed and the patient found to have no Upper respiratory infection and chronic sinusitis. HBO contraindications of hyperbaric oxygen therapy were reviewed and the patient found to have no history of retinal surgery proceeding 6 weeks or intraocular gas HBO contraindications of hyperbaric oxygen therapy were reviewed and the patient found to have no history seizure disorder or any anticonvulsant medication. HBO contraindications of hyperbaric oxygen therapy were reviewed and the patient found to have no septicemia with CO2 retention. HBO contraindications of hyperbaric oxygen therapy were reviewed and the patient found to have no fever greater than 100 degrees. HBO contraindications of hyperbaric oxygen therapy were reviewed and the patient found to have no pregnancy noted. HBO contraindications of hyperbaric oxygen therapy were reviewed and the patient found to have no medications such as steroids or narcotics or Phenergan. Medications-please add to medication list.: Wound #1 Left Achilles: Horgan, Reymond L. ([id number]) Santyl Enzymatic Ointment Wound #2 Left,Lateral Calcaneous: Santyl Enzymatic Ointment after sharply debriding all the structures which needed to  come out as noted in my detailed note I have recommended that we continue with Santyl on the area of the Achilles heel. The other areas will be packed with silver alginate and a dressing was applied. All the wounds are doing very much better after his continued hyperbaric oxygen therapy and we will also continue his IV antibiotics as per Dr. Sampson Goon. See me on a regular basis and will also follow-up with infectious disease for his routine care. Electronic Signature(s) Signed: 06/02/2014 12:26:09 PM By: Evlyn Kanner MD, FACS Entered By: Evlyn Kanner on 06/02/2014 10:17:02 Rausch, [name]  ([id number]) -------------------------------------------------------------------------------- SuperBill Details Patient Name: Muldrew, Theophil L. Date of Service: 06/02/2014 Medical Record Number: [id number] Patient Account Number: [id number] Date of Birth/Sex: 12-22-1920 (81 y.o. 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 M86.372 Chronic multifocal osteomyelitis, left ankle and foot Facility Procedures CPT4 Code Description: [id number] 11042 - DEB SUBQ TISSUE 20 SQ CM/< ICD-10 Description Diagnosis E11.621 Type 2 diabetes mellitus with foot ulcer E11.52 Type 2 diabetes mellitus with diabetic peripheral angi L97.323 Non-pressure chronic ulcer of left  ankle with necrosis M86.372 Chronic multifocal osteomyelitis, left ankle and foot Modifier: opathy with gang of muscle Quantity: 1 rene Physician Procedures CPT4 Code Description: [id number] 11042 - WC PHYS SUBQ TISS 20 SQ CM ICD-10 Description Diagnosis E11.621 Type 2 diabetes mellitus with foot ulcer E11.52 Type 2 diabetes mellitus with diabetic peripheral angi L97.323 Non-pressure chronic ulcer of left ankle  with necrosis M86.372 Chronic multifocal osteomyelitis, left ankle and foot Modifier: opathy with gang of muscle Quantity: 1 rene Electronic Signature(s) Signed: 06/02/2014 12:26:09 PM By: Evlyn Kanner MD, FACS Entered By: Evlyn Kanner on 06/02/2014 10:17:29

## 2014-06-03 NOTE — Progress Notes (Signed)
[name], Kasin L. ([id number]) Visit Report for 06/02/2014 Arrival Information Details Patient Name: [name], Kirsten L. Date of Service: 06/02/2014 9:00 AM Medical Record Number: [id number] Patient Account Number: [id number] Date of Birth/Sex: 1920/04/16 73(81 y.o. Male) Treating RN: Kristin Bruinsanneker, Nancy Primary Care Physician: [name], [name] Other Clinician: Referring Physician: [name], [name] Treating Physician/Extender: Rudene ReBritto, Errol Weeks in Treatment: 5 Visit Information History Since Last Visit Added or deleted any medications: No Patient Arrived: Wheel Chair Any new allergies or adverse reactions: No Arrival Time: 09:06 Had a fall or experienced change in No activities of daily living that may affect Accompanied By: son risk of falls: Transfer Assistance: None Signs or symptoms of abuse/neglect since last No Patient Identification Verified: Yes visito Secondary Verification Process Yes Hospitalized since last visit: No Completed: Has Dressing in Place as Prescribed: Yes Patient Requires Transmission-Based No Has Footwear/Offloading in Place as Yes Precautions: Prescribed: Patient Has Alerts: No Left: Wedge Shoe Pain Present Now: No Electronic Signature(s) Signed: 06/02/2014 4:22:36 PM By: Kristin Bruinsanneker, Nancy Entered By: Kristin Bruinsanneker, Nancy on 06/02/2014 09:07:28 Loudon, Raoul [name] ([id number]) -------------------------------------------------------------------------------- Encounter Discharge Information Details Patient Name: Serna, Piero L. Date of Service: 06/02/2014 9:00 AM Medical Record Number: [id number] Patient Account Number: [id number] Date of Birth/Sex: 1920/04/16 30(81 y.o. Male) Treating RN: Kristin Bruinsanneker, Nancy Primary Care Physician: [name], [name] Other Clinician: Referring Physician: [name], [name] Treating Physician/Extender: Rudene ReBritto, Errol Weeks in Treatment: 5 Encounter Discharge Information Items Discharge Pain Level: 0 Discharge Condition: Stable Ambulatory Status:  Wheelchair Other (Note Discharge Destination: Required) Transportation: Private Auto Accompanied By: son Schedule Follow-up Appointment: Yes Medication Reconciliation completed and provided to Patient/Care No Ulices Maack: Provided on Clinical Summary of Care: 06/02/2014 Form Type Recipient Paper Patient AE Notes Pt to HBO Electronic Signature(s) Signed: 06/02/2014 12:24:14 PM By: Gwenlyn PerkingMoore, Shelia Entered By: Gwenlyn PerkingMoore, Shelia on 06/02/2014 12:24:13 Morrical, Dres[place] Elbert EwingsL. ([id number]) -------------------------------------------------------------------------------- Lower Extremity Assessment Details Patient Name: Sabel, Keyion L. Date of Service: 06/02/2014 9:00 AM Medical Record Number: [id number] Patient Account Number: [id number] Date of Birth/Sex: 1920/04/16 47(81 y.o. Male) Treating RN: Kristin Bruinsanneker, Nancy Primary Care Physician: [name], [name] Other Clinician: Referring Physician: [name], [name] Treating Physician/Extender: Rudene ReBritto, Errol Weeks in Treatment: 5 Edema Assessment Assessed: [Left: No] [Right: No] E[Left: dema] [Right: :] Calf Left: Right: Point of Measurement: 34 cm From Medial Instep 39 cm cm Ankle Left: Right: Point of Measurement: 12 cm From Medial Instep 25.5 cm cm Vascular Assessment Claudication: Claudication Assessment [Left:None] Pulses: Posterior Tibial Palpable: [Left:No] Doppler: [Left:Monophasic] Dorsalis Pedis Palpable: [Left:No] Doppler: [Left:Monophasic] Extremity colors, hair growth, and conditions: Extremity Color: [Left:Red] Hair Growth on Extremity: [Left:No] Temperature of Extremity: [Left:Warm] Capillary Refill: [Left:> 3 seconds] Lipodermatosclerosis: [Left:No] Toe Nail Assessment Left: Right: Thick: Yes Yes Discolored: Yes Yes Deformed: Yes Yes Improper Length and Hygiene: Yes Yes [name], Brayten L. ([id number]) Electronic Signature(s) Signed: 06/02/2014 4:22:36 PM By: Kristin Bruinsanneker, Nancy Entered By: Kristin Bruinsanneker, Nancy on 06/02/2014 09:16:25 Rippeon,  Amine Elbert EwingsL. ([id number]) -------------------------------------------------------------------------------- Multi Wound Chart Details Patient Name: Venn, Shayaan L. Date of Service: 06/02/2014 9:00 AM Medical Record Number: [id number] Patient Account Number: [id number] Date of Birth/Sex: 1920/04/16 [age] y.o. Male) Treating RN: Curtis Sitesorthy, Joanna Primary Care Physician: [name], [name] Other Clinician: Referring Physician: [name], [name] Treating Physician/Extender: Rudene ReBritto, Errol Weeks in Treatment: 5 Vital Signs Height(in): 70 Pulse(bpm): 88 Weight(lbs): 200 Blood Pressure 104/53 (mmHg): Body Mass Index(BMI): 29 Temperature(F): 98 Respiratory Rate 20 (breaths/min): Photos: [1:No Photos] [2:No Photos] [3:No Photos] Wound Location: [1:Left Achilles] [2:Left Calcaneous - Lateral Left Toe Great] Wounding Event: [1:Gradually Appeared] [2:Gradually Appeared] [3:Gradually Appeared] Primary Etiology: [1: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] Comorbid History: [1:Cataracts, Chronic sinus Cataracts, Chronic sinus Cataracts, Chronic sinus problems/congestion, Congestive Heart Failure, Congestive Heart Failure, Congestive Heart Failure, Coronary Artery Disease, Coronary Artery Disease, Coronary  Artery Disease, Hypertension, Myocardial Hypertension, Myocardial Hypertension, Myocardial Infarction, Peripheral Arterial Disease, Type II Arterial Disease, Type II Arterial Disease, Type II Diabetes, Neuropathy] [2:problems/congestion, Infarction,  Peripheral Diabetes, Neuropathy] [3:problems/congestion, Infarction, Peripheral Diabetes, Neuropathy] Date Acquired: [1:01/24/2014] [2:01/24/2014] [3:01/24/2014] Weeks of Treatment: [1:5] [2:5] [3:5] Wound Status: [1:Open] [2:Open] [3:Open] Clustered Wound: [1:No] [2:Yes] [3:No] Pending Amputation on Yes [2:No] [3:No] Presentation: Measurements L x W x D 6.5x4.2x0.8 [2:0.8x3.6x0.1] [3:1.2x1.2x0.1] (cm) Area (cm) :  [1:21.441] [2:2.262] [3:1.131] Volume (cm) : [1:17.153] [2:0.226] [3:0.113] % Reduction in Area: [1:13.30%] [2:60.00%] [3:-30.90%] % Reduction in Volume: -131.20% [2:60.00%] [3:-31.40%] Classification: [1:Full Thickness With Exposed Support Structures] [2:Full Thickness Without Exposed Support Structures] [3:Unclassifiable] HBO Classification: [1:Grade 3] [2:Grade 1] [3:Grade 0] Wagner Verification: [1:Abscess] [2:N/A] [3:N/A] Exudate Amount: [1:Medium] [2:Small] [3:None Present] Exudate Type: Serous Serous N/A Exudate Color: amber amber N/A Wound Margin: Well defined, not attached Distinct, outline attached Distinct, outline attached Granulation Amount: Small (1-33%) Small (1-33%) None Present (0%) Granulation Quality: Pink Pink N/A Necrotic Amount: Large (67-100%) Medium (34-66%) Large (67-100%) Necrotic Tissue: Eschar, Adherent Slough Adherent Slough Eschar Exposed Structures: Tendon: Yes Fascia: No Fascia: No Fascia: No Fat: No Fat: No Fat: No Tendon: No Tendon: No Muscle: No Muscle: No Muscle: No Joint: No Joint: No Joint: No Bone: No Bone: No Bone: No Limited to Skin Limited to Skin Breakdown Breakdown Epithelialization: None None None Periwound Skin Texture: Edema: No Edema: No Edema: No Excoriation: No Excoriation: No Excoriation: No Induration: No Induration: No Induration: No Callus: No Callus: No Callus: No Crepitus: No Crepitus: No Crepitus: No Fluctuance: No Fluctuance: No Fluctuance: No Friable: No Friable: No Friable: No Rash: No Rash: No Rash: No Scarring: No Scarring: No Scarring: No Periwound Skin Maceration: No Maceration: No Maceration: No Moisture: Moist: No Moist: No Moist: No Dry/Scaly: No Dry/Scaly: No Dry/Scaly: No Periwound Skin Color: Erythema: Yes Atrophie Blanche: No Erythema: Yes Rubor: Yes Cyanosis: No Atrophie Blanche: No Atrophie Blanche: No Ecchymosis: No Cyanosis: No Cyanosis: No Erythema:  No Ecchymosis: No Ecchymosis: No Hemosiderin Staining: No Hemosiderin Staining: No Hemosiderin Staining: No Mottled: No Mottled: No Mottled: No Pallor: No Pallor: No Pallor: No Rubor: No Rubor: No Erythema Location: Circumferential N/A Circumferential Erythema Change: No Change N/A N/A Temperature: No Abnormality No Abnormality No Abnormality Tenderness on No Yes No Palpation: Wound Preparation: Ulcer Cleansing: Ulcer Cleansing: Ulcer Cleansing: Rinsed/Irrigated with Rinsed/Irrigated with Rinsed/Irrigated with Saline Saline Saline Topical Anesthetic Topical Anesthetic Topical Anesthetic Applied: Other: lidocaine Applied: Other: idocaine Applied: Other: 4% 4% LIDOCAINE 4% Treatment Notes YADIER, BRAMHALL ([id number]) Electronic Signature(s) Signed: 06/02/2014 5:34:11 PM By: Curtis Sites Entered By: Curtis Sites on 06/02/2014 09:35:50 Verner, [name] ([id number]) -------------------------------------------------------------------------------- Multi-Disciplinary Care Plan Details Patient Name: Prins, Treasure L. Date of Service: 06/02/2014 9:00 AM Medical Record Number: [id number] Patient Account Number: [id number] Date of Birth/Sex: 13-Mar-1920 (81 y.o. Male) Treating RN: Curtis Sites Primary Care Physician: Lindwood Qua Other Clinician: Referring Physician: Lindwood Qua Treating Physician/Extender: Rudene Re in Treatment: 5 Active Inactive Abuse / Safety / Falls / Self Care Management Nursing Diagnoses: Potential for falls Goals: Patient will remain injury free Date Initiated: 04/28/2014 Goal Status: Active Interventions: Assess fall risk on admission and as needed Notes: Necrotic Tissue Nursing Diagnoses: Impaired tissue integrity related to necrotic/devitalized tissue Goals: Necrotic/devitalized tissue will  be minimized in the wound bed Date Initiated: 04/28/2014 Goal Status: Active Interventions: Provide education on necrotic tissue and  debridement process Treatment Activities: Apply topical anesthetic as ordered : 06/02/2014 Notes: Nutrition Nursing Diagnoses: Potential for alteratiion in Nutrition/Potential for imbalanced nutrition Mayorga, Jaskarn L. ([id number]) Goals: Patient/caregiver agrees to and verbalizes understanding of need to use nutritional supplements and/or vitamins as prescribed Date Initiated: 04/28/2014 Goal Status: Active Interventions: Assess patient nutrition upon admission and as needed per policy Notes: Orientation to the Wound Care Program Nursing Diagnoses: Knowledge deficit related to the wound healing center program Goals: Patient/caregiver will verbalize understanding of the Wound Healing Center Program Date Initiated: 04/28/2014 Goal Status: Active Interventions: Provide education on orientation to the wound center Notes: Wound/Skin Impairment Nursing Diagnoses: Impaired tissue integrity Goals: Ulcer/skin breakdown will have a volume reduction of 30% by week 4 Date Initiated: 04/28/2014 Goal Status: Active Interventions: Assess ulceration(s) every visit Notes: Electronic Signature(s) Signed: 06/02/2014 5:34:11 PM By: Curtis Sites Entered By: Curtis Sites on 06/02/2014 09:34:56 Cacciola, Costa Elbert Ewings ([id number]) -------------------------------------------------------------------------------- Pain Assessment Details Patient Name: Stegeman, Baker L. Date of Service: 06/02/2014 9:00 AM Medical Record Number: [id number] Patient Account Number: [id number] Date of Birth/Sex: Jun 10, 1920 (81 y.o. Male) Treating RN: Kristin Bruins Primary Care Physician: Lindwood Qua Other Clinician: Referring Physician: Lindwood Qua Treating Physician/Extender: Rudene Re in Treatment: 5 Active Problems Location of Pain Severity and Description of Pain Patient Has Paino No Site Locations With Dressing Change: No Pain Management and Medication Current Pain Management: Notes denies having  pain Electronic Signature(s) Signed: 06/02/2014 4:22:36 PM By: Kristin Bruins Entered By: Kristin Bruins on 06/02/2014 09:07:44 Wingerter, [name] ([id number]) -------------------------------------------------------------------------------- Patient/Caregiver Education Details Patient Name: Melhorn, Harinder L. Date of Service: 06/02/2014 9:00 AM Medical Record Number: [id number] Patient Account Number: [id number] Date of Birth/Gender: March 14, 1920 (81 y.o. Male) Treating RN: Kristin Bruins Primary Care Physician: Lindwood Qua Other Clinician: Referring Physician: Lindwood Qua Treating Physician/Extender: Rudene Re in Treatment: 5 Education Assessment Education Provided To: Patient and Caregiver Education Topics Provided Wound/Skin Impairment: Handouts: Caring for Your Ulcer Methods: Demonstration, Explain/Verbal Responses: State content correctly Electronic Signature(s) Signed: 06/02/2014 4:22:36 PM By: Kristin Bruins Entered By: Kristin Bruins on 06/02/2014 10:00:16 Chhim, Brittian Elbert Ewings ([id number]) -------------------------------------------------------------------------------- Wound Assessment Details Patient Name: Vereen, Arkin L. Date of Service: 06/02/2014 9:00 AM Medical Record Number: [id number] Patient Account Number: [id number] Date of Birth/Sex: 26-Oct-1920 (81 y.o. Male) Treating RN: Kristin Bruins Primary Care Physician: Lindwood Qua Other Clinician: Referring Physician: Lindwood Qua Treating Physician/Extender: Rudene Re in Treatment: 5 Wound Status Wound Number: 1 Primary Arterial Insufficiency Ulcer Etiology: Wound Location: Left Achilles Wound Open Wounding Event: Gradually Appeared Status: Date Acquired: 01/24/2014 Comorbid Cataracts, Chronic sinus Weeks Of Treatment: 5 History: problems/congestion, Congestive Heart Clustered Wound: No Failure, Coronary Artery Disease, Pending Amputation On Presentation Hypertension, Myocardial  Infarction, Peripheral Arterial Disease, Type II Diabetes, Neuropathy Wound Measurements Length: (cm) 6.5 Width: (cm) 4.2 Depth: (cm) 0.8 Area: (cm) 21.441 Volume: (cm) 17.153 % Reduction in Area: 13.3% % Reduction in Volume: -131.2% Epithelialization: None Tunneling: No Undermining: No Wound Description Full Thickness With Exposed Foul Odor Af Classification: Support Structures Diabetic Severity Grade 3 (Wagner): Wagner Verification: Abscess Wound Margin: Well defined, not attached Exudate Amount: Medium Exudate Type: Serous Exudate Color: amber ter Cleansing: No Wound Bed Granulation Amount: Small (1-33%) Exposed Structure Granulation Quality: Pink Fascia Exposed: No Necrotic Amount: Large (67-100%) Fat Layer Exposed: No Necrotic Quality: Eschar, Adherent Slough Tendon Exposed: Yes Muscle Exposed: No Joint Exposed: No Bone  Exposed: No Periwound Skin Texture Bobrowski, Fulton L. ([id number]) Texture Color No Abnormalities Noted: No No Abnormalities Noted: No Callus: No Atrophie Blanche: No Crepitus: No Cyanosis: No Excoriation: No Ecchymosis: No Fluctuance: No Erythema: Yes Friable: No Erythema Location: Circumferential Induration: No Erythema Change: No Change Localized Edema: No Hemosiderin Staining: No Rash: No Mottled: No Scarring: No Pallor: No Rubor: Yes Moisture No Abnormalities Noted: No Temperature / Pain Dry / Scaly: No Temperature: No Abnormality Maceration: No Moist: No Wound Preparation Ulcer Cleansing: Rinsed/Irrigated with Saline Topical Anesthetic Applied: Other: lidocaine 4%, Treatment Notes Wound #1 (Left Achilles) 1. Cleansed with: Clean wound with Normal Saline 2. Anesthetic Topical Lidocaine 4% cream to wound bed prior to debridement 4. Dressing Applied: Santyl Ointment 5. Secondary Dressing Applied ABD Pad [place] ABD and Kerlix/Conform 6. Footwear/Offloading device applied Surgical shoe Electronic  Signature(s) Signed: 06/02/2014 4:22:36 PM By: Kristin Bruinsanneker, Nancy Entered By: Kristin Bruinsanneker, Nancy on 06/02/2014 09:21:56 Zappia, Raoul [name] ([id number]) -------------------------------------------------------------------------------- Wound Assessment Details Patient Name: Massie, Haakon L. Date of Service: 06/02/2014 9:00 AM Medical Record Number: [id number] Patient Account Number: [id number] Date of Birth/Sex: [birth year] [age] y.o. Male) Treating RN: Kristin Bruinsanneker, Nancy Primary Care Physician: [name], [name] Other Clinician: Referring Physician: [name], [name] Treating Physician/Extender: Rudene ReBritto, Errol Weeks in Treatment: 5 Wound Status Wound Number: 2 Primary Arterial Insufficiency Ulcer Etiology: Wound Location: Left Calcaneous - Lateral Wound Open Wounding Event: Gradually Appeared Status: Date Acquired: 01/24/2014 Comorbid Cataracts, Chronic sinus Weeks Of Treatment: 5 History: problems/congestion, Congestive Heart Clustered Wound: Yes Failure, Coronary Artery Disease, Hypertension, Myocardial Infarction, Peripheral Arterial Disease, Type II Diabetes, Neuropathy Wound Measurements Length: (cm) 0.8 Width: (cm) 3.6 Depth: (cm) 0.1 Area: (cm) 2.262 Volume: (cm) 0.226 % Reduction in Area: 60% % Reduction in Volume: 60% Epithelialization: None Wound Description Full Thickness Without Classification: Exposed Support Structures Diabetic Severity Grade 1 (Wagner): Wound Margin: Distinct, outline attached Exudate Amount: Small Exudate Type: Serous Exudate Color: amber Foul Odor After Cleansing: No Wound Bed Granulation Amount: Small (1-33%) Exposed Structure Granulation Quality: Pink Fascia Exposed: No Necrotic Amount: Medium (34-66%) Fat Layer Exposed: No Necrotic Quality: Adherent Slough Tendon Exposed: No Muscle Exposed: No Joint Exposed: No Bone Exposed: No Limited to Skin Breakdown Periwound Skin Texture Vandervelden, Ryver L. ([id number]) Texture Color No Abnormalities  Noted: No No Abnormalities Noted: No Callus: No Atrophie Blanche: No Crepitus: No Cyanosis: No Excoriation: No Ecchymosis: No Fluctuance: No Erythema: No Friable: No Hemosiderin Staining: No Induration: No Mottled: No Localized Edema: No Pallor: No Rash: No Rubor: No Scarring: No Temperature / Pain Moisture Temperature: No Abnormality No Abnormalities Noted: No Tenderness on Palpation: Yes Dry / Scaly: No Maceration: No Moist: No Wound Preparation Ulcer Cleansing: Rinsed/Irrigated with Saline Topical Anesthetic Applied: Other: idocaine 4%, Treatment Notes Wound #2 (Left, Lateral Calcaneous) 1. Cleansed with: Clean wound with Normal Saline 2. Anesthetic Topical Lidocaine 4% cream to wound bed prior to debridement 4. Dressing Applied: Aquacel Ag 5. Secondary Dressing Applied ABD Pad ABD and Kerlix/Conform Electronic Signature(s) Signed: 06/02/2014 4:22:36 PM By: Kristin Bruinsanneker, Nancy Entered By: Kristin Bruinsanneker, Nancy on 06/02/2014 09:22:17 Dengel, Boone Elbert EwingsL. ([id number]) -------------------------------------------------------------------------------- Wound Assessment Details Patient Name: Bruna, Kevon L. Date of Service: 06/02/2014 9:00 AM Medical Record Number: [id number] Patient Account Number: [id number] Date of Birth/Sex: [birth year] [age] y.o. Male) Treating RN: Kristin Bruinsanneker, Nancy Primary Care Physician: [name], [name] Other Clinician: Referring Physician: [name], [name] Treating Physician/Extender: Rudene ReBritto, Errol Weeks in Treatment: 5 Wound Status Wound Number: 3 Primary Arterial Insufficiency Ulcer Etiology: Wound Location: Left Toe Great Wound Open Wounding Event:  Gradually Appeared Status: Date Acquired: 01/24/2014 Comorbid Cataracts, Chronic sinus Weeks Of Treatment: 5 History: problems/congestion, Congestive Heart Clustered Wound: No Failure, Coronary Artery Disease, Hypertension, Myocardial Infarction, Peripheral Arterial Disease, Type II Diabetes,  Neuropathy Wound Measurements Length: (cm) 1.2 Width: (cm) 1.2 Depth: (cm) 0.1 Area: (cm) 1.131 Volume: (cm) 0.113 % Reduction in Area: -30.9% % Reduction in Volume: -31.4% Epithelialization: None Tunneling: No Undermining: No Wound Description Classification: Unclassifiable Diabetic Severity (Wagner): Grade 0 Wound Margin: Distinct, outline attach Exudate Amount: None Present Foul Odor After Cleansing: No ed Wound Bed Granulation Amount: None Present (0%) Exposed Structure Necrotic Amount: Large (67-100%) Fascia Exposed: No Necrotic Quality: Eschar Fat Layer Exposed: No Tendon Exposed: No Muscle Exposed: No Joint Exposed: No Bone Exposed: No Limited to Skin Breakdown Periwound Skin Texture Texture Color No Abnormalities Noted: No No Abnormalities Noted: No Callus: No Atrophie Blanche: No Petro, Caster L. ([id number]) Crepitus: No Cyanosis: No Excoriation: No Ecchymosis: No Fluctuance: No Erythema: Yes Friable: No Erythema Location: Circumferential Induration: No Hemosiderin Staining: No Localized Edema: No Mottled: No Rash: No Pallor: No Scarring: No Rubor: No Moisture Temperature / Pain No Abnormalities Noted: No Temperature: No Abnormality Dry / Scaly: No Maceration: No Moist: No Wound Preparation Ulcer Cleansing: Rinsed/Irrigated with Saline Topical Anesthetic Applied: Other: LIDOCAINE 4%, Treatment Notes Wound #3 (Left Toe Great) 1. Cleansed with: Clean wound with Normal Saline 2. Anesthetic Topical Lidocaine 4% cream to wound bed prior to debridement 4. Dressing Applied: Aquacel Ag 5. Secondary Dressing Applied ABD Pad ABD and Kerlix/Conform Electronic Signature(s) Signed: 06/02/2014 4:22:36 PM By: Kristin Bruins Entered By: Kristin Bruins on 06/02/2014 09:22:39 Caison, [name] ([id number]) -------------------------------------------------------------------------------- Vitals Details Patient Name: Vandermeer, Aaliyah L. Date of  Service: 06/02/2014 9:00 AM Medical Record Number: [id number] Patient Account Number: [id number] Date of Birth/Sex: 14-Dec-1920 (81 y.o. Male) Treating RN: Kristin Bruins Primary Care Physician: Lindwood Qua Other Clinician: Referring Physician: Lindwood Qua Treating Physician/Extender: Rudene Re in Treatment: 5 Vital Signs Time Taken: 09:05 Temperature (F): 98 Height (in): 70 Pulse (bpm): 88 Weight (lbs): 200 Respiratory Rate (breaths/min): 20 Body Mass Index (BMI): 28.7 Blood Pressure (mmHg): 104/53 Reference Range: 80 - 120 mg / dl Notes pt reports BS 469G Electronic Signature(s) Signed: 06/02/2014 4:22:36 PM By: Kristin Bruins Entered By: Kristin Bruins on 06/02/2014 09:09:43

## 2014-06-03 NOTE — Progress Notes (Signed)
[name], Brentley L. ([id number]) Visit Report for 06/03/2014 Arrival Information Details Patient Name: [name], [name] Date of Service: 06/03/2014 10:00 AM Medical Record Number: [id number] Patient Account Number: [id number] Date of Birth/Sex: [birth year](81 y.o. Male) Treating RN: Primary Care Physician: [name], [name] Other Clinician: Izetta DakinWallace, Sallie Referring Physician: [name], [name] Treating Physician/Extender: Rudene ReBritto, Errol Weeks in Treatment: 5 Visit Information History Since Last Visit Added or deleted any medications: No Patient Arrived: Wheel Chair Any new allergies or adverse reactions: No Arrival Time: 09:00 Had a fall or experienced change in No activities of daily living that may affect Accompanied By: Reita ClicheBobby, risk of falls: son Signs or symptoms of abuse/neglect since last No Transfer Assistance: Manual visito Patient Identification Verified: Yes Hospitalized since last visit: No Secondary Verification Process Yes Has Dressing in Place as Prescribed: Yes Completed: Pain Present Now: No Patient Requires Transmission-Based No Precautions: Patient Has Alerts: No Electronic Signature(s) Signed: 06/03/2014 9:42:00 AM By: Dayton MartesWallace, RCP,RRT,CHT, Sallie RCP, RRT, CHT Entered By: Dayton MartesWallace, RCP,RRT,CHT, Sallie on 06/03/2014 09:14:09 Jeffords, Raoul [name] ([id number]) -------------------------------------------------------------------------------- Encounter Discharge Information Details Patient Name: [name], [name] Date of Service: 06/03/2014 10:00 AM Medical Record Number: [id number] Patient Account Number: [id number] Date of Birth/Sex: [birth year] [age] y.o. Male) Treating RN: Primary Care Physician: [name], [name] Other Clinician: Izetta DakinWallace, Sallie Referring Physician: [name], [name] Treating Physician/Extender: Rudene ReBritto, Errol Weeks in Treatment: 5 Encounter Discharge Information Items Discharge Pain Level: 0 Discharge Condition: Stable Ambulatory Status: Wheelchair Discharge  Destination: Home Private Transportation: Auto Accompanied By: Reita ClicheBobby, son Schedule Follow-up Appointment: No Medication Reconciliation completed and No provided to Patient/Care Maranda Marte: Clinical Summary of Care: Notes Patient has an HBO treatment scheduled on 06/06/14 at 10:00 am. Electronic Signature(s) Signed: 06/03/2014 9:42:00 AM By: Dayton MartesWallace, RCP,RRT,CHT, Sallie RCP, RRT, CHT Entered By: Dayton MartesWallace, RCP,RRT,CHT, Sallie on 06/03/2014 12:41:15 Rutten, Raoul [name] ([id number]) -------------------------------------------------------------------------------- Vitals Details Patient Name: [name], [name] Date of Service: 06/03/2014 10:00 AM Medical Record Number: [id number] Patient Account Number: [id number] Date of Birth/Sex: [birth year](81 y.o. Male) Treating RN: Primary Care Physician: [name], [name] Other Clinician: Izetta DakinWallace, Sallie Referring Physician: [name], [name] Treating Physician/Extender: Rudene ReBritto, Errol Weeks in Treatment: 5 Vital Signs Time Taken: 09:05 Temperature (F): 98.0 Height (in): 70 Pulse (bpm): 72 Weight (lbs): 200 Respiratory Rate (breaths/min): 20 Body Mass Index (BMI): 28.7 Blood Pressure (mmHg): 100/46 Capillary Blood Glucose (mg/dl): 536256 Reference Range: 80 - 120 mg / dl Electronic Signature(s) Signed: 06/03/2014 9:42:00 AM By: Dayton MartesWallace, RCP,RRT,CHT, Sallie RCP, RRT, CHT Entered By: Dayton MartesWallace, RCP,RRT,CHT, Sallie on 06/03/2014 09:14:46

## 2014-06-06 ENCOUNTER — Encounter: Payer: Medicare Other | Admitting: Surgery

## 2014-06-06 DIAGNOSIS — L97323 Non-pressure chronic ulcer of left ankle with necrosis of muscle: Secondary | ICD-10-CM | POA: Diagnosis not present

## 2014-06-06 LAB — GLUCOSE, CAPILLARY
GLUCOSE-CAPILLARY: 240 mg/dL — AB (ref 65–99)
Glucose-Capillary: 108 mg/dL — ABNORMAL HIGH (ref 65–99)

## 2014-06-06 NOTE — Progress Notes (Signed)
[name], [name] ([id number]) Visit Report for 06/06/2014 Arrival Information Details Patient Name: [name], [name] Date of Service: 06/06/2014 10:00 AM Medical Record Number: [id number] Patient Account Number: [id number] Date of Birth/Sex: November 21, 1920 [age] y.o. Male) Treating RN: Primary Care Physician: [name], [name] Other Clinician: Izetta DakinWallace, Sallie Referring Physician: [name], [name] Treating Physician/Extender: Rudene ReBritto, Errol Weeks in Treatment: 5 Visit Information History Since Last Visit Added or deleted any medications: No Patient Arrived: Wheel Chair Any new allergies or adverse reactions: No Arrival Time: 09:15 Had a fall or experienced change in No activities of daily living that may affect Accompanied By: Reita ClicheBobby, risk of falls: son Signs or symptoms of abuse/neglect since last No Transfer Assistance: Manual visito Patient Identification Verified: Yes Hospitalized since last visit: No Secondary Verification Process Yes Has Dressing in Place as Prescribed: Yes Completed: Pain Present Now: No Patient Requires Transmission-Based No Precautions: Patient Has Alerts: No Electronic Signature(s) Signed: 06/06/2014 12:45:35 PM By: Dayton MartesWallace, RCP,RRT,CHT, Sallie RCP, RRT, CHT Entered By: Dayton MartesWallace, RCP,RRT,CHT, Sallie on 06/06/2014 09:38:18 Hoopingarner, [name] ([id number]) -------------------------------------------------------------------------------- Encounter Discharge Information Details Patient Name: [name], [name] Date of Service: 06/06/2014 10:00 AM Medical Record Number: [id number] Patient Account Number: [id number] Date of Birth/Sex: November 21, 1920 [age] y.o. Male) Treating RN: Primary Care Physician: [name], [name] Other Clinician: Izetta DakinWallace, Sallie Referring Physician: [name], [name] Treating Physician/Extender: Rudene ReBritto, Errol Weeks in Treatment: 5 Encounter Discharge Information Items Discharge Pain Level: 0 Discharge Condition: Stable Ambulatory Status: Wheelchair Discharge  Destination: Home Private Transportation: Auto Accompanied By: Reita ClicheBobby, son Schedule Follow-up Appointment: No Medication Reconciliation completed and No provided to Patient/Care Murline Weigel: Clinical Summary of Care: Notes Patient has an HBO treatment scheduled on 06/07/14 at 10:00 am. Electronic Signature(s) Signed: 06/06/2014 12:45:35 PM By: Dayton MartesWallace, RCP,RRT,CHT, Sallie RCP, RRT, CHT Entered By: Dayton MartesWallace, RCP,RRT,CHT, Sallie on 06/06/2014 12:45:10 Hollenbaugh, [name] ([id number]) -------------------------------------------------------------------------------- Vitals Details Patient Name: [name], [name] Date of Service: 06/06/2014 10:00 AM Medical Record Number: [id number] Patient Account Number: [id number] Date of Birth/Sex: November 21, 1920 [age] y.o. Male) Treating RN: Primary Care Physician: [name], [name] Other Clinician: Izetta DakinWallace, Sallie Referring Physician: [name], [name] Treating Physician/Extender: Rudene ReBritto, Errol Weeks in Treatment: 5 Vital Signs Time Taken: 09:17 Temperature (F): 97.9 Height (in): 70 Pulse (bpm): 72 Weight (lbs): 200 Respiratory Rate (breaths/min): 20 Body Mass Index (BMI): 28.7 Blood Pressure (mmHg): 100/50 Capillary Blood Glucose (mg/dl): 536240 Reference Range: 80 - 120 mg / dl Electronic Signature(s) Signed: 06/06/2014 12:45:35 PM By: Dayton MartesWallace, RCP,RRT,CHT, Sallie RCP, RRT, CHT Entered By: Dayton MartesWallace, RCP,RRT,CHT, Sallie on 06/06/2014 09:38:46

## 2014-06-06 NOTE — Progress Notes (Signed)
[name], [name] ([id number]) Visit Report for 06/06/2014 HBO Details Patient Name: [name], [name] Date of Service: 06/06/2014 10:00 AM Medical Record Number: [id number] Patient Account Number: [id number] Date of Birth/Sex: 09/22/1920 [age] y.o. 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 HBO Treatment Details Treatment Number: 6 Patient Type: Outpatient Chamber Type: Monoplace Chamber #: [id number] Treatment Protocol: 2.0 ATA with 90 minutes oxygen, and no air breaks Treatment Details Compression Rate Down: 1.5 psi / minute De-Compression Rate Up: 1.5 psi / minute Air breaks and breathing Compress Tx Pressure Decompress Decompress periods Begins Reached Begins Ends (leave unused spaces blank) Chamber Pressure 1 ATA 2.0 ATA - - - - - - 2.0 ATA 1 ATA Clock Time (24 hr) 10:21 10:33 - - - - - - 10:03 12:13 Treatment Length: 112 (minutes) Treatment Segments: 4 Capillary Blood Glucose Pre Capillary Blood Glucose (mg/dl): Post Capillary Blood Glucose (mg/dl): Vital Signs Capillary Blood Glucose Reference Range: 80 - 120 mg / dl HBO Diabetic Blood Glucose Intervention Range: <131 mg/dl or >102>249 mg/dl Time Vitals Blood Respiratory Capillary Blood Glucose Pulse Action Type: Pulse: Temperature: Taken: Pressure: Rate: Glucose (mg/dl): Meter #: Oximetry (%) Taken: Pre 09:17 100/50 72 20 97.9 240 1 none Post 12:36 100/64 72 20 97.9 108 1 none Treatment Response Well Kashuba, Luisfernando L. ([id number]) Treatment Toleration: Treatment Treatment Completed without Adverse  Event Completion Status: HBO Attestation I certify that I supervised this HBO treatment in accordance with Medicare guidelines. A trained Yes emergency response team is readily available per hospital policies and procedures. Continue HBOT as ordered. Yes Electronic Signature(s) Signed: 06/06/2014 1:04:49 PM By: Evlyn KannerBritto, Errol MD, FACS Previous Signature: 06/06/2014 12:45:35 PM Version By: Dayton MartesWallace, RCP,RRT,CHT, Sallie RCP, RRT, CHT Entered By: Evlyn KannerBritto, Errol on 06/06/2014 12:59:02 Hartsock, Raoul [name] ([id number]) -------------------------------------------------------------------------------- HBO Safety Checklist Details Patient Name: [name], [name] Date of Service: 06/06/2014 10:00 AM Medical Record Number: [id number] Patient Account Number: [id number] Date of Birth/Sex: 09/22/1920 [age] y.o. Male) Treating RN: Primary Care Physician: [name], [name] Other Clinician: Izetta DakinWallace, Sallie Referring Physician: [name], [name] Treating Physician/Extender: Rudene ReBritto, Errol Weeks in Treatment: 5 HBO Safety Checklist Items Safety Checklist Consent Form Signed Patient voided / foley secured and emptied When did you last eato 07:30 am Last dose of injectable or oral agent 07:30 am Ostomy pouch emptied and vented if applicable n/a All implantable devices assessed, documented and approved n/a Intravenous access site secured and place PICC line in left arm Valuables secured Linens and cotton and cotton/polyester blend (less than 51% polyester) Personal oil-based products / skin lotions / body lotions removed Wigs or hairpieces removed Smoking or tobacco materials removed Books / newspapers / magazines / loose paper removed Cologne, aftershave, perfume and deodorant removed Jewelry removed (may wrap wedding band) Make-up removed n/a Hair care products removed Battery operated devices (external) removed Heating patches and chemical warmers removed Titanium eyewear removed Nail polish cured greater  than 10 hours n/a Casting material cured greater than 10 hours n/a Hearing aids removed Loose dentures or partials removed Prosthetics have been removed n/a Patient demonstrates correct use of air break device (if applicable) Patient concerns have been addressed Patient grounding bracelet on and cord attached to chamber Specifics for Inpatients (complete  in addition to above) Medication sheet sent with patient Intravenous medications needed or due during therapy sent with patient [name], [name] ([id number]) Drainage tubes (e.g. nasogastric tube or chest tube secured and vented) Endotracheal or Tracheotomy tube secured Cuff deflated of air and inflated with saline Airway suctioned Electronic Signature(s) Signed: 06/06/2014 12:45:35 PM By: Dayton MartesWallace, RCP,RRT,CHT, Sallie RCP, RRT, CHT Entered By: Dayton MartesWallace, RCP,RRT,CHT, Sallie on 06/06/2014 09:39:53

## 2014-06-07 ENCOUNTER — Encounter: Payer: Medicare Other | Admitting: Surgery

## 2014-06-07 DIAGNOSIS — L97323 Non-pressure chronic ulcer of left ankle with necrosis of muscle: Secondary | ICD-10-CM | POA: Diagnosis not present

## 2014-06-07 LAB — GLUCOSE, CAPILLARY
Glucose-Capillary: 221 mg/dL — ABNORMAL HIGH (ref 65–99)
Glucose-Capillary: 142 mg/dL — ABNORMAL HIGH (ref 65–99)

## 2014-06-07 NOTE — Progress Notes (Signed)
[name], Atif L. ([id number]) Visit Report for 06/07/2014 Arrival Information Details Patient Name: [name], Shepard L. Date of Service: 06/07/2014 10:00 AM Medical Record Number: [id number] Patient Account Number: [id number] Date of Birth/Sex: 1920/11/28 [age] y.o. Male) Treating RN: Primary Care Physician: [name], [name] Other Clinician: Izetta DakinWallace, Sallie Referring Physician: [name], [name] Treating Physician/Extender: Rudene ReBritto, Errol Weeks in Treatment: 5 Visit Information History Since Last Visit Added or deleted any medications: No Patient Arrived: Wheel Chair Any new allergies or adverse reactions: No Arrival Time: 09:10 Had a fall or experienced change in No activities of daily living that may affect Accompanied By: Reita ClicheBobby, risk of falls: son Signs or symptoms of abuse/neglect No Transfer Assistance: Manual since last visito Patient Identification Verified: Yes Hospitalized since last visit: No Secondary Verification Process Yes Has Dressing in Place as Prescribed: Yes Completed: Has Footwear/Offloading in Place as Yes Patient Requires Transmission-Based No Prescribed: Precautions: Left: Surgical Shoe with Patient Has Alerts: No Pressure Relief Insole Pain Present Now: No Electronic Signature(s) Signed: 06/07/2014 1:04:50 PM By: Dayton MartesWallace, RCP,RRT,CHT, Sallie RCP, RRT, CHT Entered By: Dayton MartesWallace, RCP,RRT,CHT, Sallie on 06/07/2014 09:20:00 Callender, Raoul [name] ([id number]) -------------------------------------------------------------------------------- Encounter Discharge Information Details Patient Name: Isola, Burnham L. Date of Service: 06/07/2014 10:00 AM Medical Record Number: [id number] Patient Account Number: [id number] Date of Birth/Sex: 1920/11/28 [age] y.o. Male) Treating RN: Primary Care Physician: [name], [name] Other Clinician: Izetta DakinWallace, Sallie Referring Physician: [name], [name] Treating Physician/Extender: Rudene ReBritto, Errol Weeks in Treatment: 5 Encounter Discharge Information  Items Discharge Pain Level: 0 Discharge Condition: Stable Ambulatory Status: Wheelchair Discharge Destination: Home Private Transportation: Auto Accompanied By: Reita ClicheBobby, son Schedule Follow-up Appointment: No Medication Reconciliation completed and No provided to Patient/Care Anuj Summons: Clinical Summary of Care: Notes Patient has an HBO treatment scheduled on 06/08/14 at 10:00 am. Electronic Signature(s) Signed: 06/07/2014 1:04:50 PM By: Dayton MartesWallace, RCP,RRT,CHT, Sallie RCP, RRT, CHT Entered By: Dayton MartesWallace, RCP,RRT,CHT, Sallie on 06/07/2014 13:04:28 Budhu, Raoul [name] ([id number]) -------------------------------------------------------------------------------- Vitals Details Patient Name: Scatena, Jamear L. Date of Service: 06/07/2014 10:00 AM Medical Record Number: [id number] Patient Account Number: [id number] Date of Birth/Sex: 1920/11/28 [age] y.o. Male) Treating RN: Primary Care Physician: [name], [name] Other Clinician: Izetta DakinWallace, Sallie Referring Physician: [name], [name] Treating Physician/Extender: Rudene ReBritto, Errol Weeks in Treatment: 5 Vital Signs Time Taken: 09:10 Temperature (F): 98.3 Height (in): 70 Pulse (bpm): 72 Weight (lbs): 200 Respiratory Rate (breaths/min): 20 Body Mass Index (BMI): 28.7 Blood Pressure (mmHg): 100/50 Capillary Blood Glucose (mg/dl): 403221 Reference Range: 80 - 120 mg / dl Electronic Signature(s) Signed: 06/07/2014 1:04:50 PM By: Dayton MartesWallace, RCP,RRT,CHT, Sallie RCP, RRT, CHT Entered By: Weyman RodneyWallace, RCP,RRT,CHT, Lucio EdwardSallie on 06/07/2014 09:20:24

## 2014-06-07 NOTE — Progress Notes (Signed)
[name], Zakaree L. ([id number]) Visit Report for 06/07/2014 HBO Details Patient Name: [name], [name] Date of Service: 06/07/2014 10:00 AM Medical Record Number: [id number] Patient Account Number: [id number] Date of Birth/Sex: 1920-02-22 [age] y.o. 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 HBO Treatment Details Treatment Number: 7 Patient Type: Outpatient Chamber Type: Monoplace Chamber #: [id number] Treatment Protocol: 2.0 ATA with 90 minutes oxygen, and no air breaks Treatment Details Compression Rate Down: 1.5 psi / minute De-Compression Rate Up: 1.5 psi / minute Air breaks and breathing Compress Tx Pressure Decompress Decompress periods Begins Reached Begins Ends (leave unused spaces blank) Chamber Pressure 1 ATA 2.0 ATA - - - - - - 2.0 ATA 1 ATA Clock Time (24 hr) 10:22 10:34 - - - - - - 12:04 12:15 Treatment Length: 113 (minutes) Treatment Segments: 4 Capillary Blood Glucose Pre Capillary Blood Glucose (mg/dl): Post Capillary Blood Glucose (mg/dl): Vital Signs Capillary Blood Glucose Reference Range: 80 - 120 mg / dl HBO Diabetic Blood Glucose Intervention Range: <131 mg/dl or >865>249 mg/dl Time Vitals Blood Respiratory Capillary Blood Glucose Pulse Action Type: Pulse: Temperature: Taken: Pressure: Rate: Glucose (mg/dl): Meter #: Oximetry (%) Taken: Pre 09:10 100/50 72 20 98.3 221 1 none Post 12:39 100/50 84 20 97.7 Treatment Response Well Wiltse, Shahzad L. ([id number]) Treatment Toleration: Treatment Treatment Completed without Adverse Event Completion  Status: HBO Attestation I certify that I supervised this HBO treatment in accordance with Medicare guidelines. A trained Yes emergency response team is readily available per hospital policies and procedures. Continue HBOT as ordered. Yes Electronic Signature(s) Signed: 06/07/2014 2:50:30 PM By: Evlyn KannerBritto, Errol MD, FACS Previous Signature: 06/07/2014 1:04:50 PM Version By: Dayton MartesWallace, RCP,RRT,CHT, Sallie RCP, RRT, CHT Previous Signature: 06/07/2014 12:54:03 PM Version By: Evlyn KannerBritto, Errol MD, FACS Entered By: Evlyn KannerBritto, Errol on 06/07/2014 13:20:06 Colclough, Raoul [name] ([id number]) -------------------------------------------------------------------------------- HBO Safety Checklist Details Patient Name: Avey, Arney L. Date of Service: 06/07/2014 10:00 AM Medical Record Number: [id number] Patient Account Number: [id number] Date of Birth/Sex: 1920-02-22 [age] y.o. Male) Treating RN: Primary Care Physician: [name], [name] Other Clinician: Izetta DakinWallace, Sallie Referring Physician: [name], [name] Treating Physician/Extender: Rudene ReBritto, Errol Weeks in Treatment: 5 HBO Safety Checklist Items Safety Checklist Consent Form Signed Patient voided / foley secured and emptied When did you last eato 07:00 am Last dose of injectable or oral agent 07:00 am Ostomy pouch emptied and vented if applicable n/a All implantable devices assessed, documented and approved n/a Intravenous access site secured and place PICC line in left arm Valuables secured Linens and cotton and cotton/polyester blend (less than 51% polyester) Personal oil-based products / skin lotions / body lotions removed Wigs or hairpieces removed Smoking or tobacco materials removed Books / newspapers / magazines / loose paper removed Cologne, aftershave, perfume and deodorant removed Jewelry removed (may wrap wedding band) Make-up removed n/a Hair care products removed Battery operated devices (external) removed Heating patches and chemical warmers  removed Titanium eyewear removed Nail polish cured greater than 10 hours n/a Casting material cured greater than 10 hours n/a Hearing aids removed Loose dentures or partials removed Prosthetics have been removed n/a Patient demonstrates correct use of air break device (if applicable) Patient concerns have been addressed Patient grounding bracelet on and  cord attached to chamber Specifics for Inpatients (complete in addition to above) Medication sheet sent with patient Intravenous medications needed or due during therapy sent with patient [name], Justus L. ([id number]) Drainage tubes (e.g. nasogastric tube or chest tube secured and vented) Endotracheal or Tracheotomy tube secured Cuff deflated of air and inflated with saline Airway suctioned Electronic Signature(s) Signed: 06/07/2014 1:04:50 PM By: Dayton MartesWallace, RCP,RRT,CHT, Sallie RCP, RRT, CHT Entered By: Dayton MartesWallace, RCP,RRT,CHT, Sallie on 06/07/2014 09:21:36

## 2014-06-08 ENCOUNTER — Encounter: Payer: Medicare Other | Admitting: Surgery

## 2014-06-08 DIAGNOSIS — L97323 Non-pressure chronic ulcer of left ankle with necrosis of muscle: Secondary | ICD-10-CM | POA: Diagnosis not present

## 2014-06-08 LAB — GLUCOSE, CAPILLARY
Glucose-Capillary: 233 mg/dL — ABNORMAL HIGH (ref 65–99)
Glucose-Capillary: 142 mg/dL — ABNORMAL HIGH (ref 65–99)

## 2014-06-08 NOTE — Progress Notes (Signed)
[name], [name] ([id number]) Visit Report for 06/08/2014 HBO Details Patient Name: [name] L. Date of Service: 06/08/2014 10:00 AM Medical Record Number: [id number] Patient Account Number: [id number] Date of Birth/Sex: 1920-04-12 [age] y.o. 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 HBO Treatment Details Treatment Number: 8 Patient Type: Outpatient Chamber Type: Monoplace Chamber #: [id number] Treatment Protocol: 2.0 ATA with 90 minutes oxygen, and no air breaks Treatment Details Compression Rate Down: 1.5 psi / minute De-Compression Rate Up: 1.5 psi / minute Air breaks and breathing Compress Tx Pressure Decompress Decompress periods Begins Reached Begins Ends (leave unused spaces blank) Chamber Pressure 1 ATA 2.0 ATA - - - - - - 2.0 ATA 1 ATA Clock Time (24 hr) 10:38 10:50 - - - - - - 12:20 12:31 Treatment Length: 113 (minutes) Treatment Segments: 4 Capillary Blood Glucose Pre Capillary Blood Glucose (mg/dl): Post Capillary Blood Glucose (mg/dl): Vital Signs Capillary Blood Glucose Reference Range: 80 - 120 mg / dl HBO Diabetic Blood Glucose Intervention Range: <131 mg/dl or >644>249 mg/dl Time Vitals Blood Respiratory Capillary Blood Glucose Pulse Action Type: Pulse: Temperature: Taken: Pressure: Rate: Glucose (mg/dl): Meter #: Oximetry (%) Taken: Pre 09:28 100/50 72 18 97.8 233 1 none Post 12:37 122/54 66 18 98.4 Treatment Response [name] [name], [name] ([id number]) Treatment Toleration: Treatment Treatment Completed without Adverse Event Completion Status: HBO  Attestation I certify that I supervised this HBO treatment in accordance with Medicare guidelines. A trained Yes emergency response team is readily available per hospital policies and procedures. Continue HBOT as ordered. Yes Electronic Signature(s) Signed: 06/08/2014 1:27:22 PM By: Dayton MartesWallace, RCP,RRT,CHT, Sallie RCP, RRT, CHT Entered By: Dayton MartesWallace, RCP,RRT,CHT, Sallie on 06/08/2014 13:26:12 [name] [name] ([id number]) -------------------------------------------------------------------------------- HBO Safety Checklist Details Patient Name: [name] L. Date of Service: 06/08/2014 10:00 AM Medical Record Number: [id number] Patient Account Number: [id number] Date of Birth/Sex: 1920-04-12 [age] y.o. Male) Treating RN: Primary Care Physician: [name], [name] Other Clinician: Izetta DakinWallace, Sallie Referring Physician: [name], [name] Treating Physician/Extender: Tania AdeWeeks in Treatment: 5 HBO Safety Checklist Items Safety Checklist Consent Form Signed Patient voided / foley secured and emptied When did you last eato 07:00 am Last dose of injectable or oral agent 07:00 am Ostomy pouch emptied and vented if applicable n/a All implantable devices assessed, documented and approved n/a Intravenous access site secured and place PICC line in left arm Valuables secured Linens and cotton and cotton/polyester blend (less than 51% polyester) Personal oil-based products / skin lotions / body lotions removed Wigs or hairpieces removed Smoking or tobacco materials removed Books / newspapers / magazines / loose paper removed Cologne, aftershave, perfume and deodorant removed Jewelry removed (may wrap wedding band) Make-up removed n/a Hair care products removed Battery operated devices (external) removed Heating patches and chemical warmers removed Titanium eyewear removed Nail polish cured greater than 10 hours n/a Casting material cured greater than 10 hours n/a Hearing aids removed Loose dentures or  partials removed Prosthetics have been removed n/a Patient demonstrates correct use of air break device (if applicable) Patient concerns have been addressed Patient grounding bracelet on and cord attached to chamber Specifics for Inpatients (complete in addition to above) Medication sheet sent with patient Intravenous medications needed or due during therapy sent  with patient [name] [name] L. ([id number]) Drainage tubes (e.g. nasogastric tube or chest tube secured and vented) Endotracheal or Tracheotomy tube secured Cuff deflated of air and inflated with saline Airway suctioned Electronic Signature(s) Signed: 06/08/2014 1:27:22 PM By: Dayton MartesWallace, RCP,RRT,CHT, Sallie RCP, RRT, CHT Entered By: Dayton MartesWallace, RCP,RRT,CHT, Sallie on 06/08/2014 09:43:07

## 2014-06-08 NOTE — Progress Notes (Addendum)
[name], Cheyenne L. ([id number]) Visit Report for 06/08/2014 Arrival Information Details Patient Name: [name], [name] Date of Service: 06/08/2014 10:00 AM Medical Record Number: [id number] Patient Account Number: [id number] Date of Birth/Sex: [birth year] [age] y.o. Male) Treating RN: Primary Care Physician: [name], [name] Other Clinician: Izetta DakinWallace, Sallie Referring Physician: [name], [name] Treating Physician/Extender: BURNS, Regis BillWALTER Weeks in Treatment: 5 Visit Information History Since Last Visit Added or deleted any medications: No Patient Arrived: Wheel Chair Any new allergies or adverse reactions: No Arrival Time: 09:25 Had a fall or experienced change in No activities of daily living that may affect Accompanied By: Reita ClicheBobby, risk of falls: son Signs or symptoms of abuse/neglect No Transfer Assistance: Manual since last visito Patient Identification Verified: Yes Hospitalized since last visit: No Secondary Verification Process Yes Has Dressing in Place as Prescribed: Yes Completed: Has Footwear/Offloading in Place as No Patient Requires Transmission-Based No Prescribed: Precautions: Left: Surgical Shoe with Patient Has Alerts: No Pressure Relief Insole Pain Present Now: No Electronic Signature(s) Signed: 06/08/2014 4:43:35 PM By: Francie MassingKelly, Tia Previous Signature: 06/08/2014 1:27:22 PM Version By: Dayton MartesWallace, RCP,RRT,CHT, Sallie RCP, RRT, CHT Entered By: Francie MassingKelly, Tia on 06/08/2014 16:43:35 Nobel, Raoul [name] ([id number]) -------------------------------------------------------------------------------- Encounter Discharge Information Details Patient Name: [name], [name] Date of Service: 06/08/2014 10:00 AM Medical Record Number: [id number] Patient Account Number: [id number] Date of Birth/Sex: [birth year] [age] y.o. Male) Treating RN: Primary Care Physician: [name], [name] Other Clinician: Izetta DakinWallace, Sallie Referring Physician: [name], [name] Treating Physician/Extender: Tania AdeWeeks in Treatment:  5 Encounter Discharge Information Items Discharge Pain Level: 0 Discharge Condition: Stable Ambulatory Status: Wheelchair Discharge Destination: Home Private Transportation: Auto Accompanied By: Reita ClicheBobby, son Schedule Follow-up Appointment: No Medication Reconciliation completed and No provided to Patient/Care Arush Gatliff: Clinical Summary of Care: Notes Patient has an HBO treatment scheduled on 06/09/14 at 10:00 am. Electronic Signature(s) Signed: 06/08/2014 1:27:22 PM By: Dayton MartesWallace, RCP,RRT,CHT, Sallie RCP, RRT, CHT Entered By: Dayton MartesWallace, RCP,RRT,CHT, Sallie on 06/08/2014 13:27:08 Mercer, Raoul [name] ([id number]) -------------------------------------------------------------------------------- Vitals Details Patient Name: [name], [name] Date of Service: 06/08/2014 10:00 AM Medical Record Number: [id number] Patient Account Number: [id number] Date of Birth/Sex: [birth year] [age] y.o. Male) Treating RN: Primary Care Physician: [name], [name] Other Clinician: Izetta DakinWallace, Sallie Referring Physician: [name], [name] Treating Physician/Extender: Weeks in Treatment: 5 Vital Signs Time Taken: 09:28 Temperature (F): 97.8 Height (in): 70 Pulse (bpm): 72 Weight (lbs): 200 Respiratory Rate (breaths/min): 18 Body Mass Index (BMI): 28.7 Blood Pressure (mmHg): 100/50 Capillary Blood Glucose (mg/dl): 536233 Reference Range: 80 - 120 mg / dl Electronic Signature(s) Signed: 06/08/2014 1:27:22 PM By: Dayton MartesWallace, RCP,RRT,CHT, Sallie RCP, RRT, CHT Entered By: Weyman RodneyWallace, RCP,RRT,CHT, Lucio EdwardSallie on 06/08/2014 09:41:58

## 2014-06-09 ENCOUNTER — Encounter: Payer: Medicare Other | Admitting: Surgery

## 2014-06-09 DIAGNOSIS — L97323 Non-pressure chronic ulcer of left ankle with necrosis of muscle: Secondary | ICD-10-CM | POA: Diagnosis not present

## 2014-06-09 LAB — GLUCOSE, CAPILLARY
GLUCOSE-CAPILLARY: 233 mg/dL — AB (ref 65–99)
Glucose-Capillary: 91 mg/dL (ref 65–99)

## 2014-06-09 NOTE — Progress Notes (Signed)
[name], Alexy L. ([id number]) Visit Report for 06/09/2014 HBO Details Patient Name: [name], [name] Date of Service: 06/09/2014 10:00 AM Medical Record Number: [id number] Patient Account Number: [id number] Date of Birth/Sex: 1920/06/29 [age] y.o. 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 HBO Treatment Details Treatment Number: 9 Patient Type: Outpatient Chamber Type: Monoplace Chamber #: [id number] Treatment Protocol: 2.0 ATA with 90 minutes oxygen, and no air breaks Treatment Details Compression Rate Down: 1.5 psi / minute De-Compression Rate Up: 1.5 psi / minute Air breaks and breathing Compress Tx Pressure Decompress Decompress periods Begins Reached Begins Ends (leave unused spaces blank) Chamber Pressure 1 ATA 2.0 ATA - - - - - - 2.0 ATA 1 ATA Clock Time (24 hr) 10:42 10:54 - - - - - - 12:24 12:35 Treatment Length: 113 (minutes) Treatment Segments: 4 Capillary Blood Glucose Pre Capillary Blood Glucose (mg/dl): Post Capillary Blood Glucose (mg/dl): Vital Signs Capillary Blood Glucose Reference Range: 80 - 120 mg / dl HBO Diabetic Blood Glucose Intervention Range: <131 mg/dl or >295>249 mg/dl Time Vitals Blood Respiratory Capillary Blood Glucose Pulse Action Type: Pulse: Temperature: Taken: Pressure: Rate: Glucose (mg/dl): Meter #: Oximetry (%) Taken: Pre 10:00 124/46 69 18 97.9 233 1 none Post 12:42 122/52 66 18 98.3 91 1 patient refused Ensure - see below Treatment Response Well Furber, Emerson L. ([id number]) Treatment Toleration: Treatment Treatment Completed  without Adverse Event Completion Status: Treatment Notes Patient had brought peanut butter crackers with him - MD notified and patient ate the crackers. Electronic Signature(s) Signed: 06/09/2014 1:17:24 PM By: Sallee ProvencalWallace, RCP,RRT,CHT, Sallie RCP, RRT, CHT Signed: 06/09/2014 1:59:58 PM By: Evlyn KannerBritto, Errol MD, FACS Previous Signature: 06/09/2014 12:32:56 PM Version By: Evlyn KannerBritto, Errol MD, FACS Entered By: Dayton MartesWallace, RCP,RRT,CHT, Sallie on 06/09/2014 12:57:51 [name], [name] ([id number]) -------------------------------------------------------------------------------- HBO Safety Checklist Details Patient Name: [name], [name] Date of Service: 06/09/2014 10:00 AM Medical Record Number: [id number] Patient Account Number: [id number] Date of Birth/Sex: 1920/06/29 [age] y.o. Male) Treating RN: Primary Care Physician: [name], [name] Other Clinician: Izetta DakinWallace, Sallie Referring Physician: [name], [name] Treating Physician/Extender: Rudene ReBritto, Errol Weeks in Treatment: 6 HBO Safety Checklist Items Safety Checklist Consent Form Signed Patient voided / foley secured and emptied When did you last eato 07:00 am Last dose of injectable or oral agent 07:00 am Ostomy pouch emptied and vented if applicable n/a All implantable devices assessed, documented and approved Pacemake implanted Intravenous access site secured and place PICC line in left arm Valuables secured Linens and cotton and cotton/polyester blend (less than 51% polyester) Personal oil-based products / skin lotions / body lotions removed Wigs or hairpieces removed Smoking or tobacco materials removed Books / newspapers / magazines / loose paper removed Cologne, aftershave, perfume and deodorant removed Jewelry removed (may wrap wedding band) Make-up removed n/a Hair care products removed Battery operated devices (external) removed Heating patches and chemical warmers removed Titanium eyewear removed Nail polish cured greater than 10 hours  n/a Casting material cured greater than 10 hours n/a Hearing aids removed Loose dentures or partials removed Prosthetics have been removed n/a Patient demonstrates correct use of air break device (if applicable) Patient concerns have been addressed Patient grounding bracelet on and cord attached to chamber Specifics for Inpatients (complete  in addition to above) Medication sheet sent with patient Intravenous medications needed or due during therapy sent with patient [name], [name] ([id number]) Drainage tubes (e.g. nasogastric tube or chest tube secured and vented) Endotracheal or Tracheotomy tube secured Cuff deflated of air and inflated with saline Airway suctioned Electronic Signature(s) Signed: 06/09/2014 1:17:24 PM By: Dayton MartesWallace, RCP,RRT,CHT, Sallie RCP, RRT, CHT Entered By: Weyman RodneyWallace, RCP,RRT,CHT, Lucio EdwardSallie on 06/09/2014 10:58:18

## 2014-06-09 NOTE — Progress Notes (Signed)
[name], Daivion L. ([id number]) Visit Report for 06/09/2014 Arrival Information Details Patient Name: [name], [name] Date of Service: 06/09/2014 10:00 AM Medical Record Number: [id number] Patient Account Number: [id number] Date of Birth/Sex: 01-28-1920 [age] y.o. Male) Treating RN: Primary Care Physician: [name], [name] Other Clinician: Izetta DakinWallace, Sallie Referring Physician: [name], [name] Treating Physician/Extender: Rudene ReBritto, Errol Weeks in Treatment: 6 Visit Information History Since Last Visit Added or deleted any medications: No Patient Arrived: Wheel Chair Any new allergies or adverse reactions: No Arrival Time: 10:00 Had a fall or experienced change in No activities of daily living that may affect Accompanied By: Reita ClicheBobby, risk of falls: son Signs or symptoms of abuse/neglect No Transfer Assistance: Manual since last visito Patient Identification Verified: Yes Hospitalized since last visit: No Secondary Verification Process Yes Has Dressing in Place as Prescribed: Yes Completed: Has Footwear/Offloading in Place as Yes Patient Requires Transmission-Based No Prescribed: Precautions: Left: Surgical Shoe with Patient Has Alerts: No Pressure Relief Insole Pain Present Now: No Electronic Signature(s) Signed: 06/09/2014 1:17:24 PM By: Dayton MartesWallace, RCP,RRT,CHT, Sallie RCP, RRT, CHT Entered By: Dayton MartesWallace, RCP,RRT,CHT, Sallie on 06/09/2014 10:56:23 Moscoso, Raoul [name] ([id number]) -------------------------------------------------------------------------------- Encounter Discharge Information Details Patient Name: [name], [name] Date of Service: 06/09/2014 10:00 AM Medical Record Number: [id number] Patient Account Number: [id number] Date of Birth/Sex: 01-28-1920 [age] y.o. Male) Treating RN: Primary Care Physician: [name], [name] Other Clinician: Izetta DakinWallace, Sallie Referring Physician: [name], [name] Treating Physician/Extender: Rudene ReBritto, Errol Weeks in Treatment: 6 Encounter Discharge Information  Items Discharge Pain Level: 0 Discharge Condition: Stable Ambulatory Status: Wheelchair Discharge Destination: Home Private Transportation: Auto Accompanied By: Reita ClicheBobby, son Schedule Follow-up Appointment: No Medication Reconciliation completed and No provided to Patient/Care Triva Hueber: Clinical Summary of Care: Notes Patient has an HBO treatment scheduled on 06/10/14 at 10:00 am. Electronic Signature(s) Signed: 06/09/2014 1:17:24 PM By: Dayton MartesWallace, RCP,RRT,CHT, Sallie RCP, RRT, CHT Entered By: Dayton MartesWallace, RCP,RRT,CHT, Sallie on 06/09/2014 12:58:51 Motyka, Raoul [name] ([id number]) -------------------------------------------------------------------------------- Vitals Details Patient Name: [name], [name] Date of Service: 06/09/2014 10:00 AM Medical Record Number: [id number] Patient Account Number: [id number] Date of Birth/Sex: 01-28-1920 [age] y.o. Male) Treating RN: Primary Care Physician: [name], [name] Other Clinician: Izetta DakinWallace, Sallie Referring Physician: [name], [name] Treating Physician/Extender: Rudene ReBritto, Errol Weeks in Treatment: 6 Vital Signs Time Taken: 10:00 Temperature (F): 97.9 Height (in): 70 Pulse (bpm): 69 Weight (lbs): 200 Respiratory Rate (breaths/min): 18 Body Mass Index (BMI): 28.7 Blood Pressure (mmHg): 124/46 Capillary Blood Glucose (mg/dl): 956233 Reference Range: 80 - 120 mg / dl Electronic Signature(s) Signed: 06/09/2014 1:17:24 PM By: Dayton MartesWallace, RCP,RRT,CHT, Sallie RCP, RRT, CHT Entered By: Dayton MartesWallace, RCP,RRT,CHT, Sallie on 06/09/2014 10:57:08

## 2014-06-10 ENCOUNTER — Encounter: Payer: Medicare Other | Admitting: Surgery

## 2014-06-10 DIAGNOSIS — L97323 Non-pressure chronic ulcer of left ankle with necrosis of muscle: Secondary | ICD-10-CM | POA: Diagnosis not present

## 2014-06-10 LAB — GLUCOSE, CAPILLARY
GLUCOSE-CAPILLARY: 164 mg/dL — AB (ref 65–99)
Glucose-Capillary: 299 mg/dL — ABNORMAL HIGH (ref 65–99)

## 2014-06-10 NOTE — Progress Notes (Signed)
[name], [name] ([id number]) Visit Report for 06/09/2014 Arrival Information Details Patient Name: [name], [name]. Date of Service: 06/09/2014 8:45 AM Medical Record Number: [id number] Patient Account Number: [id number] Date of Birth/Sex: 1920/03/02 (81 y.o. Male) Treating RN: Huel Coventry Primary Care Physician: Lindwood Qua Other Clinician: Referring Physician: Lindwood Qua Treating Physician/Extender: Rudene Re in Treatment: 6 Visit Information History Since Last Visit Added or deleted any medications: No Patient Arrived: Wheel Chair Any new allergies or adverse reactions: No Arrival Time: 09:04 Had a fall or experienced change in No activities of daily living that may affect Accompanied By: son risk of falls: Transfer Assistance: Manual Signs or symptoms of abuse/neglect No Patient Identification Verified: Yes since last visito Secondary Verification Process Yes Hospitalized since last visit: No Completed: Has Dressing in Place as Prescribed: Yes Patient Requires Transmission-Based No Has Footwear/Offloading in Place as Yes Precautions: Prescribed: Patient Has Alerts: No Left: Surgical Shoe with Pressure Relief Insole Pain Present Now: No Electronic Signature(s) Signed: 06/09/2014 4:39:41 PM By: Elliot Gurney, RN, BSN, Kim RN, BSN Entered By: Elliot Gurney, RN, BSN, Kim on 06/09/2014 09:07:32 Tung, [name] ([id number]) -------------------------------------------------------------------------------- Encounter Discharge Information Details Patient Name: Shillingburg, Dash L. Date of Service: 06/09/2014 8:45 AM Medical Record Number: [id number] Patient Account Number: [id number] Date of Birth/Sex: 1920-10-11 (81 y.o. Male) Treating RN: Curtis Sites Primary Care Physician: Lindwood Qua Other Clinician: Referring Physician: Lindwood Qua Treating Physician/Extender: Rudene Re in Treatment: 6 Encounter Discharge Information Items Discharge Pain Level:  0 Discharge Condition: Stable Ambulatory Status: Wheelchair Discharge Destination: Home Transportation: Private Auto Accompanied By: son Schedule Follow-up Appointment: Yes Medication Reconciliation completed and provided to Patient/Care No Reine Bristow: Provided on Clinical Summary of Care: 06/09/2014 Form Type Recipient Paper Patient AE Electronic Signature(s) Signed: 06/09/2014 1:09:54 PM By: Gwenlyn Perking Entered By: Gwenlyn Perking on 06/09/2014 13:09:54 Peachey, Taiten [name] ([id number]) -------------------------------------------------------------------------------- Lower Extremity Assessment Details Patient Name: Aigner, Martavius L. Date of Service: 06/09/2014 8:45 AM Medical Record Number: [id number] Patient Account Number: [id number] Date of Birth/Sex: 07/08/1920 (81 y.o. Male) Treating RN: Huel Coventry Primary Care Physician: Lindwood Qua Other Clinician: Referring Physician: Lindwood Qua Treating Physician/Extender: Rudene Re in Treatment: 6 Edema Assessment Assessed: [Left: No] [Right: No] E[Left: dema] [Right: :] Calf Left: Right: Point of Measurement: 34 cm From Medial Instep 38.8 cm cm Ankle Left: Right: Point of Measurement: 12 cm From Medial Instep 26.5 cm cm Vascular Assessment Pulses: Posterior Tibial Palpable: [Left:No] Doppler: [Left:Monophasic] Dorsalis Pedis Palpable: [Left:No] Doppler: [Left:Monophasic] Extremity colors, hair growth, and conditions: Extremity Color: [Left:Normal] Hair Growth on Extremity: [Left:No] Temperature of Extremity: [Left:Warm] Capillary Refill: [Left:< 3 seconds] Toe Nail Assessment Left: Right: Thick: Yes Discolored: Yes Deformed: Yes Improper Length and Hygiene: Yes Electronic Signature(s) Signed: 06/09/2014 4:39:41 PM By: Elliot Gurney, RN, BSN, Kim RN, BSN [address], [name] ([id number]) Entered By: Elliot Gurney, RN, BSN, Kim on 06/09/2014 09:34:07 Zoll, [name]  ([id number]) -------------------------------------------------------------------------------- Multi Wound Chart Details Patient Name: Fabela, Fredderick L. Date of Service: 06/09/2014 8:45 AM Medical Record Number: [id number] Patient Account Number: [id number] Date of Birth/Sex: [birth year] (81 y.o. 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] Comorbid History: [1:Cataracts, Chronic sinus Cataracts, Chronic sinus Cataracts, Chronic sinus problems/congestion, Congestive Heart Failure, Congestive Heart Failure, Congestive Heart Failure, Coronary Artery Disease, Coronary Artery Disease, Coronary  Artery Disease, Hypertension, Myocardial Hypertension, Myocardial Hypertension, Myocardial Infarction, Peripheral Arterial Disease, Type II Arterial Disease, Type II Arterial Disease, Type II Diabetes, Neuropathy] [2:problems/congestion, Infarction,  Peripheral Diabetes, Neuropathy] [3:problems/congestion, Infarction, Peripheral Diabetes, Neuropathy] Date Acquired: [1:01/24/2014] [2:01/24/2014] [3:01/24/2014] Weeks of Treatment: [1:6] [2:6] [3:6] Wound Status: [1:Open] [2:Open] [3:Open] Clustered Wound: [1:No] [2:Yes] [3:No] Pending Amputation on Yes [2:No] [3:No] Presentation: Measurements L x W x D 7.5x4.3x0.7 [2:0.8x4x0.4]  [3:0.8x0.8x0.1] (cm) Area (cm) : [1:25.329] [2:2.513] [3:0.503] Volume (cm) : [1:17.73] [2:1.005] [3:0.05] % Reduction in Area: [1:-2.40%] [2:55.60%] [3:41.80%] % Reduction in Volume: -138.90% [2:-77.90%] [3:41.90%] Starting Position 1 11 [2:1] (o'clock): Ending Position 1 [1:1] [2:2] (o'clock): Maximum Distance 1 1.5 [2:0.2] (cm): Harpenau, Yussuf L. ([id number]) Undermining: Yes Yes No Classification: Full Thickness With Full Thickness Without Unclassifiable Exposed Support Exposed Support Structures Structures HBO Classification: Grade 3 Grade 1 Grade 0 Wagner Verification: Abscess N/A N/A Exudate Amount: Medium Small None Present Exudate Type: Serous Serous N/A Exudate Color: amber amber N/A Wound Margin: Epibole Distinct, outline attached Distinct, outline attached Granulation Amount: Small (1-33%) Small (1-33%) None Present (0%) Granulation Quality: Pink Pink N/A Necrotic Amount: Large (67-100%) Medium (34-66%) Large (67-100%) Necrotic Tissue: Eschar, Adherent Slough Adherent Slough Eschar Exposed Structures: Tendon: Yes Fascia: No Fascia: No Fascia: No Fat: No Fat: No Fat: No Tendon: No Tendon: No Muscle: No Muscle: No Muscle: No Joint: No Joint: No Joint: No Bone: No Bone: No Bone: No Limited to Skin Limited to Skin Breakdown Breakdown Epithelialization: None None None Periwound Skin Texture: Edema: No Edema: No Edema: No Excoriation: No Excoriation: No Excoriation: No Induration: No Induration: No Induration: No Callus: No Callus: No Callus: No Crepitus: No Crepitus: No Crepitus: No Fluctuance: No Fluctuance: No Fluctuance: No Friable: No Friable: No Friable: No Rash: No Rash: No Rash: No Scarring: No Scarring: No Scarring: No Periwound Skin Moist: Yes Moist: Yes Dry/Scaly: Yes Moisture: Maceration: No Maceration: No Maceration: No Dry/Scaly: No Dry/Scaly: No Moist: No Periwound Skin Color: Erythema: Yes Atrophie Blanche: No  Atrophie Blanche: No Rubor: Yes Cyanosis: No Cyanosis: No Atrophie Blanche: No Ecchymosis: No Ecchymosis: No Cyanosis: No Erythema: No Erythema: No Ecchymosis: No Hemosiderin Staining: No Hemosiderin Staining: No Hemosiderin Staining: No Mottled: No Mottled: No Mottled: No Pallor: No Pallor: No Pallor: No Rubor: No Rubor: No Erythema Location: Circumferential N/A N/A Erythema Change: No Change N/A N/A Temperature: No Abnormality No Abnormality No Abnormality Tenderness on No Yes No Palpation: Wound Preparation: Ulcer Cleansing: Ulcer Cleansing: Ulcer Cleansing: Rinsed/Irrigated with Rinsed/Irrigated with Rinsed/Irrigated with Horsley, Phill L. ([id number]) Saline Saline Saline Topical Anesthetic Topical Anesthetic Topical Anesthetic Applied: Other: lidocaine Applied: Other: idocaine Applied: Other: 4% 4% LIDOCAINE 4% Treatment Notes Electronic Signature(s) Signed: 06/09/2014 4:34:18 PM By: Curtis Sites Entered By: Curtis Sites on 06/09/2014 09:41:09 Lasseigne, [name] ([id number]) -------------------------------------------------------------------------------- Multi-Disciplinary Care Plan Details Patient Name: Mottola, Kinneth L. Date of Service: 06/09/2014 8:45 AM Medical Record Number: [id number] Patient Account Number: [id number] Date of Birth/Sex: January 25, 1920 (81 y.o. Male) Treating RN: Curtis Sites Primary Care Physician: Lindwood Qua Other Clinician: Referring Physician: Lindwood Qua Treating Physician/Extender: Rudene Re in Treatment: 6 Active Inactive Abuse / Safety / Falls / Self Care Management Nursing Diagnoses: Potential for falls Goals: Patient will remain injury free Date Initiated: 04/28/2014 Goal  Status: Active Interventions: Assess fall risk on admission and as needed Notes: Necrotic Tissue Nursing Diagnoses: Impaired tissue integrity related to necrotic/devitalized tissue Goals: Necrotic/devitalized tissue will be minimized in  the wound bed Date Initiated: 04/28/2014 Goal Status: Active Interventions: Provide education on necrotic tissue and debridement process Treatment Activities: Apply topical anesthetic as ordered : 06/09/2014 Notes: Nutrition Nursing Diagnoses: Potential for alteratiion in Nutrition/Potential for imbalanced nutrition Loveday, Linford L. ([id number]) Goals: Patient/caregiver agrees to and verbalizes understanding of need to use nutritional supplements and/or vitamins as prescribed Date Initiated: 04/28/2014 Goal Status: Active Interventions: Assess patient nutrition upon admission and as needed per policy Notes: Orientation to the Wound Care Program Nursing Diagnoses: Knowledge deficit related to the wound healing center program Goals: Patient/caregiver will verbalize understanding of the Wound Healing Center Program Date Initiated: 04/28/2014 Goal Status: Active Interventions: Provide education on orientation to the wound center Notes: Wound/Skin Impairment Nursing Diagnoses: Impaired tissue integrity Goals: Ulcer/skin breakdown will have a volume reduction of 30% by week 4 Date Initiated: 04/28/2014 Goal Status: Active Interventions: Assess ulceration(s) every visit Notes: Electronic Signature(s) Signed: 06/09/2014 4:34:18 PM By: Curtis Sitesorthy, Joanna Entered By: Curtis Sitesorthy, Joanna on 06/09/2014 09:40:57 Halliday, Emersen Elbert EwingsL. ([id number]) -------------------------------------------------------------------------------- Pain Assessment Details Patient Name: Bencosme, Eulis L. Date of Service: 06/09/2014 8:45 AM Medical Record Number: [id number] Patient Account Number: [id number] Date of Birth/Sex: Jun 17, 1920 3(81 y.o. Male) Treating RN: Huel CoventryWoody, Kim Primary Care Physician: [name], [name] Other Clinician: Referring Physician: [name], [name] Treating Physician/Extender: Rudene ReBritto, Errol Weeks in Treatment: 6 Active Problems Location of Pain Severity and Description of Pain Patient Has Paino No Site  Locations Pain Management and Medication Current Pain Management: Electronic Signature(s) Signed: 06/09/2014 4:39:41 PM By: Elliot GurneyWoody, RN, BSN, Kim RN, BSN Entered By: Elliot GurneyWoody, RN, BSN, Kim on 06/09/2014 09:07:41 Bold, Raoul [name] ([id number]) -------------------------------------------------------------------------------- Patient/Caregiver Education Details Patient Name: Russell, Rupert L. Date of Service: 06/09/2014 8:45 AM Medical Record Number: [id number] Patient Account Number: [id number] Date of Birth/Gender: Jun 17, 1920 3(81 y.o. Male) Treating RN: Curtis Sitesorthy, Joanna Primary Care Physician: [name], [name] Other Clinician: Referring Physician: [name], [name] Treating Physician/Extender: Rudene ReBritto, Errol Weeks in Treatment: 6 Education Assessment Education Provided To: Patient and Caregiver Education Topics Provided Wound/Skin Impairment: Handouts: Caring for Your Ulcer Methods: Demonstration, Explain/Verbal Responses: State content correctly Electronic Signature(s) Signed: 06/09/2014 4:34:18 PM By: Curtis Sitesorthy, Joanna Entered By: Curtis Sitesorthy, Joanna on 06/09/2014 10:12:52 Weinberg, Marquell Elbert EwingsL. ([id number]) -------------------------------------------------------------------------------- Wound Assessment Details Patient Name: Papesh, Deloss L. Date of Service: 06/09/2014 8:45 AM Medical Record Number: [id number] Patient Account Number: [id number] Date of Birth/Sex: Jun 17, 1920 35(81 y.o. Male) Treating RN: Huel CoventryWoody, Kim Primary Care Physician: [name], [name] Other Clinician: Referring Physician: [name], [name] Treating Physician/Extender: Rudene ReBritto, Errol Weeks in Treatment: 6 Wound Status Wound Number: 1 Primary Arterial Insufficiency Ulcer Etiology: Wound Location: Left Achilles Wound Open Wounding Event: Gradually Appeared Status: Date Acquired: 01/24/2014 Comorbid Cataracts, Chronic sinus Weeks Of Treatment: 6 History: problems/congestion, Congestive Heart Clustered Wound: No Failure, Coronary Artery  Disease, Pending Amputation On Presentation Hypertension, Myocardial Infarction, Peripheral Arterial Disease, Type II Diabetes, Neuropathy Photos Photo Uploaded By: Elliot GurneyWoody, RN, BSN, Kim on 06/09/2014 12:28:27 Wound Measurements Length: (cm) 7.5 % Reduction i Width: (cm) 4.3 % Reduction i Depth: (cm) 0.7 Epithelializa Area: (cm) 25.329 Undermining: Volume: (cm) 17.73 Starting Ending Pos Maximum Judithann Saugeri Shepler, Kamryn L. ([id number]) n Area: -2.4% n Volume: -138.9% tion: None Yes Position (o'clock): 11 ition (o'clock): 1 stance: (cm) 1.5 Wound Description Full Thickness With Exposed Classification: Support Structures Diabetic Severity Grade 3 (Wagner): Loreta AveWagner Verification: Abscess Wound Margin: Epibole  Exudate Amount: Medium Exudate Type: Serous Exudate Color: amber Foul Odor After Cleansing: No Wound Bed Granulation Amount: Small (1-33%) Exposed Structure Granulation Quality: Pink Fascia Exposed: No Necrotic Amount: Large (67-100%) Fat Layer Exposed: No Necrotic Quality: Eschar, Adherent Slough Tendon Exposed: Yes Muscle Exposed: No Joint Exposed: No Bone Exposed: No Periwound Skin Texture Texture Color No Abnormalities Noted: No No Abnormalities Noted: No Callus: No Atrophie Blanche: No Crepitus: No Cyanosis: No Excoriation: No Ecchymosis: No Fluctuance: No Erythema: Yes Friable: No Erythema Location: Circumferential Induration: No Erythema Change: No Change Localized Edema: No Hemosiderin Staining: No Rash: No Mottled: No Scarring: No Pallor: No Rubor: Yes Moisture No Abnormalities Noted: No Temperature / Pain Dry / Scaly: No Temperature: No Abnormality Maceration: No Moist: Yes Wound Preparation Ulcer Cleansing: Rinsed/Irrigated with Saline Topical Anesthetic Applied: Other: lidocaine 4%, Treatment Notes Wound #1 (Left Achilles) 1. Cleansed with: Clean wound with Normal Saline Genter, Averey L. ([id number]) 2. Anesthetic Topical  Lidocaine 4% cream to wound bed prior to debridement 4. Dressing Applied: Santyl Ointment 5. Secondary Dressing Applied ABD and Kerlix/Conform 7. Secured with Tape Notes Teacher, adult educationstretch netting Electronic Signature(s) Signed: 06/09/2014 4:39:41 PM By: Elliot GurneyWoody, RN, BSN, Kim RN, BSN Entered By: Elliot GurneyWoody, RN, BSN, Kim on 06/09/2014 09:21:11 Ketter, Raoul [name] ([id number]) -------------------------------------------------------------------------------- Wound Assessment Details Patient Name: Moss, Ercel L. Date of Service: 06/09/2014 8:45 AM Medical Record Number: [id number] Patient Account Number: [id number] Date of Birth/Sex: October 15, 1920 [age] y.o. Male) Treating RN: Huel CoventryWoody, Kim Primary Care Physician: [name], [name] Other Clinician: Referring Physician: [name], [name] Treating Physician/Extender: Rudene ReBritto, Errol Weeks in Treatment: 6 Wound Status Wound Number: 2 Primary Arterial Insufficiency Ulcer Etiology: Wound Location: Left Calcaneous - Lateral Wound Open Wounding Event: Gradually Appeared Status: Date Acquired: 01/24/2014 Comorbid Cataracts, Chronic sinus Weeks Of Treatment: 6 History: problems/congestion, Congestive Heart Clustered Wound: Yes Failure, Coronary Artery Disease, Hypertension, Myocardial Infarction, Peripheral Arterial Disease, Type II Diabetes, Neuropathy Photos Photo Uploaded By: Elliot GurneyWoody, RN, BSN, Kim on 06/09/2014 12:28:55 Wound Measurements Length: (cm) 0.8 Width: (cm) 4 Depth: (cm) 0.4 Area: (cm) 2.513 Volume: (cm) 1.005 % Reduction in Area: 55.6% % Reduction in Volume: -77.9% Epithelialization: None Tunneling: No Undermining: Yes Starting Position (o'clock): 1 Ending Position (o'clock): 2 Maximum Distance: (cm) 0.2 Wound Description Full Thickness Without Classification: Exposed Support Structures Diabetic Severity Grade 1 (Wagner): Mcconaghy, Antwine L. ([id number]) Foul Odor After Cleansing: No Wound Margin: Distinct, outline attached Exudate Amount:  Small Exudate Type: Serous Exudate Color: amber Wound Bed Granulation Amount: Small (1-33%) Exposed Structure Granulation Quality: Pink Fascia Exposed: No Necrotic Amount: Medium (34-66%) Fat Layer Exposed: No Necrotic Quality: Adherent Slough Tendon Exposed: No Muscle Exposed: No Joint Exposed: No Bone Exposed: No Limited to Skin Breakdown Periwound Skin Texture Texture Color No Abnormalities Noted: No No Abnormalities Noted: No Callus: No Atrophie Blanche: No Crepitus: No Cyanosis: No Excoriation: No Ecchymosis: No Fluctuance: No Erythema: No Friable: No Hemosiderin Staining: No Induration: No Mottled: No Localized Edema: No Pallor: No Rash: No Rubor: No Scarring: No Temperature / Pain Moisture Temperature: No Abnormality No Abnormalities Noted: No Tenderness on Palpation: Yes Dry / Scaly: No Maceration: No Moist: Yes Wound Preparation Ulcer Cleansing: Rinsed/Irrigated with Saline Topical Anesthetic Applied: Other: idocaine 4%, Treatment Notes Wound #2 (Left, Lateral Calcaneous) 1. Cleansed with: Clean wound with Normal Saline 2. Anesthetic Topical Lidocaine 4% cream to wound bed prior to debridement 4. Dressing Applied: Aquacel Ag 5. Secondary Dressing Applied Tamayo, Jobani L. ([id number]) Guaze, ABD and kerlix/Conform 7. Secured with Secretary/administratorTape Electronic Signature(s) Signed: 06/09/2014  4:39:41 PM By: Elliot Gurney, RN, BSN, Kim RN, BSN Entered By: Elliot Gurney, RN, BSN, Kim on 06/09/2014 09:22:07 Sarina Ill ([id number]) -------------------------------------------------------------------------------- Wound Assessment Details Patient Name: Pasquariello, Adolphus L. Date of Service: 06/09/2014 8:45 AM Medical Record Number: [id number] Patient Account Number: [id number] Date of Birth/Sex: September 18, 1920 (81 y.o. Male) Treating RN: Huel Coventry Primary Care Physician: Lindwood Qua Other Clinician: Referring Physician: Lindwood Qua Treating Physician/Extender: Rudene Re in Treatment: 6 Wound Status Wound Number: 3 Primary Arterial Insufficiency Ulcer Etiology: Wound Location: Left Toe Great Wound Open Wounding Event: Gradually Appeared Status: Date Acquired: 01/24/2014 Comorbid Cataracts, Chronic sinus Weeks Of Treatment: 6 History: problems/congestion, Congestive Heart Clustered Wound: No Failure, Coronary Artery Disease, Hypertension, Myocardial Infarction, Peripheral Arterial Disease, Type II Diabetes, Neuropathy Photos Photo Uploaded By: Elliot Gurney, RN, BSN, Kim on 06/09/2014 12:28:55 Wound Measurements Length: (cm) 0.8 Width: (cm) 0.8 Depth: (cm) 0.1 Area: (cm) 0.503 Volume: (cm) 0.05 % Reduction in Area: 41.8% % Reduction in Volume: 41.9% Epithelialization: None Tunneling: No Undermining: No Wound Description Classification: Unclassifiable Diabetic Severity Loreta [address]): Grade 0 Wound Margin: Distinct, outline attach Exudate Amount: None Present Foul Odor After Cleansing: No ed Wound Bed Granulation Amount: None Present (0%) Exposed Structure Necrotic Amount: Large (67-100%) Fascia Exposed: No Bord, Arslan L. ([id number]) Necrotic Quality: Eschar Fat Layer Exposed: No Tendon Exposed: No Muscle Exposed: No Joint Exposed: No Bone Exposed: No Limited to Skin Breakdown Periwound Skin Texture Texture Color No Abnormalities Noted: No No Abnormalities Noted: No Callus: No Atrophie Blanche: No Crepitus: No Cyanosis: No Excoriation: No Ecchymosis: No Fluctuance: No Erythema: No Friable: No Hemosiderin Staining: No Induration: No Mottled: No Localized Edema: No Pallor: No Rash: No Rubor: No Scarring: No Temperature / Pain Moisture Temperature: No Abnormality No Abnormalities Noted: No Dry / Scaly: Yes Maceration: No Moist: No Wound Preparation Ulcer Cleansing: Rinsed/Irrigated with Saline Topical Anesthetic Applied: Other: LIDOCAINE 4%, Treatment Notes Wound #3 (Left Toe Great) 1. Cleansed  with: Clean wound with Normal Saline 2. Anesthetic Topical Lidocaine 4% cream to wound bed prior to debridement 4. Dressing Applied: Aquacel Ag 5. Secondary Dressing Applied Guaze, ABD and kerlix/Conform 7. Secured with Secretary/administrator) Signed: 06/09/2014 4:39:41 PM By: Elliot Gurney, RN, BSN, Kim RN, BSN Entered By: Elliot Gurney, RN, BSN, Kim on 06/09/2014 09:22:43 TAYON, PAREKH ([id number]) DOMANSKI, Daman [name] ([id number]) -------------------------------------------------------------------------------- Vitals Details Patient Name: Wann, Kensley L. Date of Service: 06/09/2014 8:45 AM Medical Record Number: [id number] Patient Account Number: [id number] Date of Birth/Sex: 1920/12/27 (81 y.o. Male) Treating RN: Huel Coventry Primary Care Physician: Lindwood Qua Other Clinician: Referring Physician: Lindwood Qua Treating Physician/Extender: Rudene Re in Treatment: 6 Vital Signs Time Taken: 09:05 Temperature (F): 97.9 Height (in): 70 Pulse (bpm): 85 Weight (lbs): 200 Respiratory Rate (breaths/min): 18 Body Mass Index (BMI): 28.7 Blood Pressure (mmHg): 87/34 Capillary Blood Glucose (mg/dl): 034 Reference Range: 80 - 120 mg / dl Notes MD Notified of BP 87/37, manual 90/40. MD stated to have patient ay flat for 5 minutes and retake pressure before sending him for his HBO treatment. Case Manager notified and will follow through with MD request. Electronic Signature(s) Signed: 06/09/2014 4:39:41 PM By: Elliot Gurney, RN, BSN, Kim RN, BSN Entered By: Elliot Gurney, RN, BSN, Kim on 06/09/2014 10:20:01

## 2014-06-10 NOTE — Progress Notes (Signed)
[name], Gertrude L. ([id number]) Visit Report for 06/10/2014 Arrival Information Details Patient Name: [name], [name] Date of Service: 06/10/2014 10:00 AM Medical Record Number: [id number] Patient Account Number: [id number] Date of Birth/Sex: 1920/06/29 [age] y.o. Male) Treating RN: Primary Care Physician: [name], [name] Other Clinician: Izetta DakinWallace, Sallie Referring Physician: [name], [name] Treating Physician/Extender: Rudene ReBritto, Errol Weeks in Treatment: 6 Visit Information History Since Last Visit Added or deleted any medications: No Patient Arrived: Wheel Chair Any new allergies or adverse reactions: No Arrival Time: 09:23 Had a fall or experienced change in No activities of daily living that may affect Accompanied By: Reita ClicheBobby, risk of falls: son Signs or symptoms of abuse/neglect No Transfer Assistance: Manual since last visito Patient Identification Verified: Yes Hospitalized since last visit: No Secondary Verification Process Yes Has Dressing in Place as Prescribed: Yes Completed: Has Footwear/Offloading in Place as Yes Patient Requires Transmission-Based No Prescribed: Precautions: Left: Surgical Shoe with Patient Has Alerts: No Pressure Relief Insole Pain Present Now: No Electronic Signature(s) Signed: 06/10/2014 3:16:07 PM By: Dayton MartesWallace, RCP,RRT,CHT, Sallie RCP, RRT, CHT Entered By: Dayton MartesWallace, RCP,RRT,CHT, Sallie on 06/10/2014 09:28:57 [name], [name] ([id number]) -------------------------------------------------------------------------------- Encounter Discharge Information Details Patient Name: [name], [name] Date of Service: 06/10/2014 10:00 AM Medical Record Number: [id number] Patient Account Number: [id number] Date of Birth/Sex: 1920/06/29 [age] y.o. Male) Treating RN: Primary Care Physician: [name], [name] Other Clinician: Izetta DakinWallace, Sallie Referring Physician: [name], [name] Treating Physician/Extender: Rudene ReBritto, Errol Weeks in Treatment: 6 Encounter Discharge Information  Items Discharge Pain Level: 0 Discharge Condition: Stable Ambulatory Status: Wheelchair Discharge Destination: Home Private Transportation: Auto Accompanied By: Reita ClicheBobby, son Schedule Follow-up Appointment: No Medication Reconciliation completed and No provided to Patient/Care [name]: Clinical Summary of Care: Notes Patient has an HBO treatment scheduled on 06/13/14 at 10:00 am. Electronic Signature(s) Signed: 06/10/2014 3:16:07 PM By: Dayton MartesWallace, RCP,RRT,CHT, Sallie RCP, RRT, CHT Entered By: Dayton MartesWallace, RCP,RRT,CHT, Sallie on 06/10/2014 12:34:26 [name], [name] ([id number]) -------------------------------------------------------------------------------- Vitals Details Patient Name: [name], [name] Date of Service: 06/10/2014 10:00 AM Medical Record Number: [id number] Patient Account Number: [id number] Date of Birth/Sex: 1920/06/29 [age] y.o. Male) Treating RN: Primary Care Physician: [name], [name] Other Clinician: Izetta DakinWallace, Sallie Referring Physician: [name], [name] Treating Physician/Extender: Rudene ReBritto, Errol Weeks in Treatment: 6 Vital Signs Time Taken: 09:24 Temperature (F): 97.8 Height (in): 70 Pulse (bpm): 78 Weight (lbs): 200 Respiratory Rate (breaths/min): 18 Body Mass Index (BMI): 28.7 Blood Pressure (mmHg): 101/58 Capillary Blood Glucose (mg/dl): 742299 Reference Range: 80 - 120 mg / dl Electronic Signature(s) Signed: 06/10/2014 3:16:07 PM By: Dayton MartesWallace, RCP,RRT,CHT, Sallie RCP, RRT, CHT Entered By: Dayton MartesWallace, RCP,RRT,CHT, Sallie on 06/10/2014 [id number]:24

## 2014-06-10 NOTE — Progress Notes (Signed)
[name], [name] ([id number]) Visit Report for 06/09/2014 Chief Complaint Document Details Patient Name: [name], [name] Date of Service: 06/09/2014 8:45 AM Medical Record Number: [id number] Patient Account Number: [id number] Date of Birth/Sex: 11/08/1920 (81 y.o. Male) Treating RN: Primary Care Physician: Lindwood Qua Other Clinician: Referring Physician: Lindwood Qua Treating Physician/Extender: Rudene Re in Treatment: 6 Information Obtained from: Patient Chief Complaint Patient presents to the wound care center for a consult due non healing wound 81 year old gentleman who comes with a history of having some ulcerated areas on his heels since February 2016 and then a large injury to his left posterior heel and ankle since about a month. Electronic Signature(s) Signed: 06/09/2014 12:32:56 PM By: Evlyn Kanner [name], FACS Entered By: Evlyn Kanner on 06/09/2014 10:00:29 [name], [name] ([id number]) -------------------------------------------------------------------------------- Debridement Details Patient Name: [name], [name] Date of Service: 06/09/2014 8:45 AM Medical Record Number: [id number] Patient Account Number: [id number] Date of Birth/Sex: 07-Mar-1920 (81 y.o. Male) Treating RN: Primary Care Physician: Lindwood Qua Other Clinician: Referring Physician: Lindwood Qua Treating Physician/Extender: Rudene Re in Treatment: 6 Debridement Performed for Wound #1 Left Achilles Assessment: Performed By: Physician Tristan Schroeder., [name] Debridement: Debridement Pre-procedure Yes Verification/Time Out Taken: Start Time: 09:44 Pain Control: Lidocaine 4% Topical Solution Level: Skin/Subcutaneous Tissue Total Area Debrided (L x 5 (cm) x 3 (cm) = 15 (cm) W): Tissue and other Viable, Non-Viable, Fibrin/Slough, Ligament, Skin, Subcutaneous material debrided: Instrument: Curette, Forceps, Scissors Bleeding: Minimum Hemostasis Achieved: Pressure End Time:  09:50 Procedural Pain: 0 Post Procedural Pain: 0 Response to Treatment: Procedure was tolerated well Post Debridement Measurements of Total Wound Length: (cm) 7.5 Width: (cm) 4.3 Depth: (cm) 0.7 Volume: (cm) 17.73 Electronic Signature(s) Signed: 06/09/2014 12:32:56 PM By: Evlyn Kanner [name], FACS Entered By: Evlyn Kanner on 06/09/2014 09:59:12 [name], [name] ([id number]) -------------------------------------------------------------------------------- Debridement Details Patient Name: [name], [name] Date of Service: 06/09/2014 8:45 AM Medical Record Number: [id number] Patient Account Number: [id number] Date of Birth/Sex: 02/03/1920 (81 y.o. Male) Treating RN: Primary Care Physician: Lindwood Qua Other Clinician: Referring Physician: Lindwood Qua Treating Physician/Extender: Rudene Re in Treatment: 6 Debridement Performed for Wound #2 Left,Lateral Calcaneous Assessment: Performed By: Physician Tristan Schroeder., [name] Debridement: Debridement Pre-procedure Yes Verification/Time Out Taken: Start Time: 09:41 Pain Control: Lidocaine 4% Topical Solution Level: Skin/Subcutaneous Tissue Total Area Debrided (L x 0.8 (cm) x 4 (cm) = 3.2 (cm) W): Tissue and other Viable, Non-Viable, Fibrin/Slough, Subcutaneous material debrided: Instrument: Curette Bleeding: Minimum Hemostasis Achieved: Pressure End Time: 09:44 Procedural Pain: 0 Post Procedural Pain: 0 Response to Treatment: Procedure was tolerated well Post Debridement Measurements of Total Wound Length: (cm) 0.8 Width: (cm) 4 Depth: (cm) 0.4 Volume: (cm) 1.005 Electronic Signature(s) Signed: 06/09/2014 12:32:56 PM By: Evlyn Kanner [name], FACS Entered By: Evlyn Kanner on 06/09/2014 09:59:45 [name], [name] ([id number]) -------------------------------------------------------------------------------- Debridement Details Patient Name: [name], [name] Date of Service: 06/09/2014 8:45 AM Medical Record  Number: [id number] Patient Account Number: [id number] Date of Birth/Sex: 16-Dec-1920 (81 y.o. Male) Treating RN: Primary Care Physician: Lindwood Qua Other Clinician: Referring Physician: Lindwood Qua Treating Physician/Extender: Rudene Re in Treatment: 6 Debridement Performed for Wound #3 Left Toe Great Assessment: Performed By: Physician Tristan Schroeder., [name] Debridement: Debridement Pre-procedure Yes Verification/Time Out Taken: Start Time: 09:37 Pain Control: Lidocaine 4% Topical Solution Level: Skin/Subcutaneous Tissue Total Area Debrided (L x 0.8 (cm) x 0.8 (cm) = 0.64 (cm) W): Tissue and other Viable, Non-Viable, Eschar, Fibrin/Slough, Subcutaneous material debrided: Instrument: Curette Bleeding: Minimum Hemostasis Achieved: Pressure End  Time: 09:41 Procedural Pain: 0 Post Procedural Pain: 0 Response to Treatment: Procedure was tolerated well Post Debridement Measurements of Total Wound Length: (cm) 0.8 Width: (cm) 0.8 Depth: (cm) 0.1 Volume: (cm) 0.05 Electronic Signature(s) Signed: 06/09/2014 12:32:56 PM By: Evlyn Kanner [name], FACS Entered By: Evlyn Kanner on 06/09/2014 10:00:15 [name], [name] ([id number]) -------------------------------------------------------------------------------- HPI Details Patient Name: [name], [name] Date of Service: 06/09/2014 8:45 AM Medical Record Number: [id number] Patient Account Number: [id number] Date of Birth/Sex: 04-Nov-1920 (81 y.o. Male) Treating RN: Primary Care Physician: Lindwood Qua Other Clinician: Referring Physician: Lindwood Qua Treating Physician/Extender: Rudene Re in Treatment: 6 History of Present Illness HPI Description: 81 year old gentleman who was known to be a diabetic for many years has peripheral neuropathy recently went to his primary care doctor for a punctured wound on his left heel which was something he had noticed. The patient then was referred to a podiatrist who  referred him to Dr. Wyn Quaker in the vascular surgery department and I understand the procedure was done on 03/14/2014 with a left lower extremity vascular procedure and stenting was done. Details of this are not available at the present time. The patient has been applying Neosporin to his leg and has not had any wound care addressed so far. patient has also had a history of coronary artery disease in the past and hasn't had a CABG and a pacemaker placement in the remote past.Other details and notes are pending. the patient is not in pain lives alone and has some home health and other aides coming to help him with his daily chores and meals. reviewing the vascular notes I understand the procedure was done on 03/14/2014 and he was operated by Dr. Wyn Quaker. he had a catheter placement to the left peroneal artery and a aortogram and selective left lower extremity angiogram was done. He also had a percutaneous transluminal angioplasty of the left peroneal artery and the tibioperoneal trunk. The distal SFA and above-knee popliteal artery were also angioplastied. A subcutaneous stent placement to the distal superficial femoral artery was also done. 05/05/2014 -- the patient has not had any change in his health and after much consideration is decided that he does not want HBOT as he is claustrophobic and was unable to tolerate being in the chamber for 90 minutes. I have discussed with him that his most recent x-ray of the foot shows that he has the distal phalanx of the left great toe showing changes with osteomyelitis cannot be excluded at that site. He tells me that he cannot have an MRI because of his defibrillator and hence we will order a triple phase bone scan. I have also reviewed his culture report which shows several organisms and he has sensitivity to tetracycline and we have recommended he takes this for 14 days and this has been given to him on 05/02/2014 05/12/2014 the bone scan done on 05/10/2014  shows #1 findings are worrisome for osteomyelitis involving the calcaneus of the left foot, #2 increase uptake localizing to the left second and third toe on all 3 phases. Cannot rule out osteomyelitis in this area. And #3 left foot cellulitis. 05/19/2014 -- reviewed several reports which we have received back on [facility]. #1 chest x-ray done on April 21 shows chronic changes in the left base no acute findings. #2 his CBC is within normal limits. #3 hemoglobin A1c was 8.5%. #4 EKG done on 26 April was within normal limits due to his electronic ventricular pacemaker. 05/26/2014 -- he has had his  PICC line placed and is taking vancomycin daily basis. He is to start hyperbaric oxygen therapy on this coming Monday. 06/09/2014 -- he saw Dr. Sampson Goon yesterday and besides his IV antibiotic I believe a oral antibiotic was BRISTON, LAX. ([id number]) also given and this may be Cipro. Patient is feeling fine otherwise does not have any symptoms though his blood pressure this morning in the wound center has been 90/50. We will check his blood pressure in the supine position. Electronic Signature(s) Signed: 06/09/2014 12:32:56 PM By: Evlyn Kanner [name], FACS Entered By: Evlyn Kanner on 06/09/2014 10:01:40 [name] ([id number]) -------------------------------------------------------------------------------- Physical Exam Details Patient Name: [name], [name] Date of Service: 06/09/2014 8:45 AM Medical Record Number: [id number] Patient Account Number: [id number] Date of Birth/Sex: 05-Jun-1920 (81 y.o. Male) Treating RN: Primary Care Physician: Lindwood Qua Other Clinician: Referring Physician: Lindwood Qua Treating Physician/Extender: Rudene Re in Treatment: 6 Constitutional . Pulse regular. Respirations normal and unlabored. Afebrile. . Eyes Nonicteric. Reactive to light. Ears, Nose, Mouth, and Throat Lips, teeth, and gums WNL.Marland Kitchen Moist mucosa without lesions  . Neck supple and nontender. No palpable supraclavicular or cervical adenopathy. Normal sized without goiter. Respiratory WNL. No retractions.. Cardiovascular no palpable ankle pulses. No clubbing, cyanosis or edema. Integumentary (Hair, Skin) all the wounds have minimal slough and this is sharply debrided with a curette. The posterior ankle has a lot of debris and some part of his cartilage and ligament needs to be sharply debrided.. No crepitus or fluctuance. No peri-wound warmth or erythema. No masses.Marland Kitchen Psychiatric Judgement and insight Intact.. No evidence of depression, anxiety, or agitation.. Electronic Signature(s) Signed: 06/09/2014 12:32:56 PM By: Evlyn Kanner [name], FACS Entered By: Evlyn Kanner on 06/09/2014 10:03:04 [name] ([id number]) -------------------------------------------------------------------------------- Physician Orders Details Patient Name: [name], [name] Date of Service: 06/09/2014 8:45 AM Medical Record Number: [id number] Patient Account Number: [id number] Date of Birth/Sex: Aug 12, 1920 (81 y.o. Male) Treating RN: Curtis Sites Primary Care Physician: Lindwood Qua Other Clinician: Referring Physician: Lindwood Qua Treating Physician/Extender: Rudene Re in Treatment: 6 Verbal / Phone Orders: Yes Clinician: Curtis Sites Read Back and Verified: Yes Diagnosis Coding Wound Cleansing Wound #1 Left Achilles o Clean wound with Normal Saline. o May shower with protection. Wound #2 Left,Lateral Calcaneous o Clean wound with Normal Saline. o May shower with protection. Wound #3 Left Toe Great o Clean wound with Normal Saline. o May shower with protection. Anesthetic Wound #1 Left Achilles o Topical Lidocaine 4% cream applied to wound bed prior to debridement Wound #2 Left,Lateral Calcaneous o Topical Lidocaine 4% cream applied to wound bed prior to debridement Wound #3 Left Toe Great o Topical Lidocaine 4% cream  applied to wound bed prior to debridement Skin Barriers/Peri-Wound Care Wound #1 Left Achilles o Skin Prep Wound #2 Left,Lateral Calcaneous o Skin Prep Wound #3 Left Toe Great o Skin Prep Primary Wound Dressing Wound #1 Left Achilles o Santyl Ointment Aguillard, Keelyn L. ([id number]) Wound #2 Left,Lateral Calcaneous o Aquacel Ag Wound #3 Left Toe Great o Aquacel Ag Secondary Dressing Wound #1 Left Achilles o Gauze, ABD and Kerlix/Conform Wound #2 Left,Lateral Calcaneous o Gauze, ABD and Kerlix/Conform Wound #3 Left Toe Great o Gauze, ABD and Kerlix/Conform Dressing Change Frequency Wound #1 Left Achilles o Change dressing every day. Wound #2 Left,Lateral Calcaneous o Change dressing every day. Wound #3 Left Toe Great o Change dressing every day. Follow-up Appointments Wound #1 Left Achilles o Return Appointment in 1 week. Wound #2 Left,Lateral Calcaneous o Return Appointment in 1  week. Wound #3 Left Toe Great o Return Appointment in 1 week. Additional Orders / Instructions Wound #1 Left Achilles o Increase protein intake. o Other: - goal is to keep blood sugars under 180 Wound #2 Left,Lateral Calcaneous o Increase protein intake. o Other: - goal is to keep blood sugars under 180 Wound #3 Left Toe Great o Increase protein intake. [name] IllVANS, Rahsaan L. ([id number]) o Other: - goal is to keep blood sugars under 180 Home Health Wound #1 Left Achilles o Continue Home Health Visits - [facility] o Home Health Nurse may visit PRN to address patientos wound care needs. o FACE TO FACE ENCOUNTER: MEDICARE and MEDICAID PATIENTS: I certify that this patient is under my care and that I had a face-to-face encounter that meets the physician face-to-face encounter requirements with this patient on this date. The encounter with the patient was in whole or in part for the following MEDICAL CONDITION: (primary reason for Home  Healthcare) MEDICAL NECESSITY: I certify, that based on my findings, NURSING services are a medically necessary home health service. HOME BOUND STATUS: I certify that my clinical findings support that this patient is homebound (i.e., Due to illness or injury, pt requires aid of supportive devices such as crutches, cane, wheelchairs, walkers, the use of special transportation or the assistance of another person to leave their place of residence. There is a normal inability to leave the home and doing so requires considerable and taxing effort. Other absences are for medical reasons / religious services and are infrequent or of short duration when for other reasons). o If current dressing causes regression in wound condition, may D/C ordered dressing product/s and apply Normal Saline Moist Dressing daily until next Wound Healing Center / Other [name] appointment. Notify Wound Healing Center of regression in wound condition at [phone]. o Please direct any NON-WOUND related issues/requests for orders to patient's Primary Care Physician Wound #2 Left,Lateral Calcaneous o [facility] o Home Health Nurse may visit PRN to address patientos wound care needs. o FACE TO FACE ENCOUNTER: MEDICARE and MEDICAID PATIENTS: I certify that this patient is under my care and that I had a face-to-face encounter that meets the physician face-to-face encounter requirements with this patient on this date. The encounter with the patient was in whole or in part for the following MEDICAL CONDITION: (primary reason for Home Healthcare) MEDICAL NECESSITY: I certify, that based on my findings, NURSING services are a medically necessary home health service. HOME BOUND STATUS: I certify that my clinical findings support that this patient is homebound (i.e., Due to illness or injury, pt requires aid of supportive devices such as crutches, cane, wheelchairs, walkers, the use of  special transportation or the assistance of another person to leave their place of residence. There is a normal inability to leave the home and doing so requires considerable and taxing effort. Other absences are for medical reasons / religious services and are infrequent or of short duration when for other reasons). o If current dressing causes regression in wound condition, may D/C ordered dressing product/s and apply Normal Saline Moist Dressing daily until next Wound Healing Center / Other [name] appointment. Notify Wound Healing Center of regression in wound condition at [phone]. o Please direct any NON-WOUND related issues/requests for orders to patient's Primary Care Physician Wound #3 Left Toe Doretha SouGreat o Continue Home Health Visits - [facility] o Home Health Nurse may visit PRN to address patientos wound care needs. [name],  [name] ([id number]) o FACE TO FACE ENCOUNTER: MEDICARE and MEDICAID PATIENTS: I certify that this patient is under my care and that I had a face-to-face encounter that meets the physician face-to-face encounter requirements with this patient on this date. The encounter with the patient was in whole or in part for the following MEDICAL CONDITION: (primary reason for Home Healthcare) MEDICAL NECESSITY: I certify, that based on my findings, NURSING services are a medically necessary home health service. HOME BOUND STATUS: I certify that my clinical findings support that this patient is homebound (i.e., Due to illness or injury, pt requires aid of supportive devices such as crutches, cane, wheelchairs, walkers, the use of special transportation or the assistance of another person to leave their place of residence. There is a normal inability to leave the home and doing so requires considerable and taxing effort. Other absences are for medical reasons / religious services and are infrequent or of short duration when for other reasons). o If current  dressing causes regression in wound condition, may D/C ordered dressing product/s and apply Normal Saline Moist Dressing daily until next Wound Healing Center / Other [name] appointment. Notify Wound Healing Center of regression in wound condition at [phone]. o Please direct any NON-WOUND related issues/requests for orders to patient's Primary Care Physician Hyperbaric Oxygen Therapy Wound #3 Left Toe Great o Indication: - osteomyelitis o If appropriate for treatment, begin HBOT per protocol: o 2.0 ATA for 90 Minutes without Air Breaks o One treatment per day (delivered Monday through Friday unless otherwise specified in Special Instructions below): o Total # of Treatments: - 40 o Finger stick Blood Glucose Pre- and Post- HBOT Treatment. o Follow Hyperbaric Oxygen Glycemia Protocol HBO Contraindications Wound #3 Left Toe Great - Left Lower Extremity o HBO contraindications of hyperbaric oxygen therapy were reviewed and the patient found to have no untreated pneumothorax or history of spontaneous pneumothorax. o HBO contraindications of hyperbaric oxygen therapy were reviewed and the patient found to have no history of medications such as Bleomycin, Adriamycin, disulfiram, cisplatin and sulfamylon and is not currently receiving any chemotherapy. o HBO contraindications of hyperbaric oxygen therapy were reviewed and the patient found to have no Upper respiratory infection and chronic sinusitis. o HBO contraindications of hyperbaric oxygen therapy were reviewed and the patient found to have no history of retinal surgery proceeding 6 weeks or intraocular gas o HBO contraindications of hyperbaric oxygen therapy were reviewed and the patient found to have no history seizure disorder or any anticonvulsant medication. o HBO contraindications of hyperbaric oxygen therapy were reviewed and the patient found to have no septicemia with CO2 retention. o HBO  contraindications of hyperbaric oxygen therapy were reviewed and the patient found to have no fever greater than 100 degrees. o HBO contraindications of hyperbaric oxygen therapy were reviewed and the patient found to have no pregnancy noted. [name], [name] ([id number]) o HBO contraindications of hyperbaric oxygen therapy were reviewed and the patient found to have no medications such as steroids or narcotics or Phenergan. Medications-please add to medication list. Wound #1 Left Achilles - Left Lower Extremity o Santyl Enzymatic Ointment Wound #2 Left,Lateral Calcaneous - Left Lower Extremity o Santyl Enzymatic Ointment GLYCEMIA INTERVENTIONS PROTOCOL PRE-HBO GLYCEMIA INTERVENTIONS ACTION INTERVENTION Obtain pre-HBO capillary blood 1 glucose (ensure physician order is in chart). A. Notify HBO physician and await physician orders. 2 If result is 70 mg/dl or below: B. If the result meets the hospital definition of a critical result, follow hospital policy. A.  Give patient an 8 ounce Glucerna Shake, an 8 ounce Ensure, or 8 ounces of a Glucerna/Ensure equivalent dietary supplement*. B. Wait 30 minutes. If result is 71 mg/dl to 213130 mg/dl: C. Retest patientos capillary blood glucose (CBG). D. If result greater than or equal to 110 mg/dl, proceed with HBO. If result less than 110 mg/dl, notify HBO physician and consider holding HBO. If result is 131 mg/dl to 086249 mg/dl: A. Proceed with HBO. A. Notify HBO physician and await physician orders. B. It is recommended to hold HBO and do blood/urine ketone If result is 250 mg/dl or greater: testing. C. If the result meets the hospital definition of a critical result, follow hospital policy. POST-HBO GLYCEMIA INTERVENTIONS ACTION INTERVENTION Obtain post HBO capillary blood 1 glucose (ensure physician order is in chart). [name], [name] ([id number]) 2 If result is 70 mg/dl or below: A. Notify HBO physician and await  physician orders. B. If the result meets the hospital definition of a critical result, follow hospital policy. A. Give patient an 8 ounce Glucerna Shake, an 8 ounce Ensure, or 8 ounces of a Glucerna/Ensure equivalent dietary supplement*. B. Wait 15 minutes for symptoms of hypoglycemia (i.e. nervousness, anxiety, If result is 71 mg/dl to 528100 mg/dl: sweating, chills, clamminess, irritability, confusion, tachycardia or dizziness). C. If patient asymptomatic, discharge patient. If patient symptomatic, repeat capillary blood glucose (CBG) and notify HBO physician. If result is 101 mg/dl to 413249 mg/dl: A. Discharge patient. A. Notify HBO physician and await physician orders. B. It is recommended to do If result is 250 mg/dl or greater: blood/urine ketone testing. C. If the result meets the hospital definition of a critical result, follow hospital policy. *Juice or candies are NOT equivalent products. If patient refuses the Glucerna or Ensure, please consult the hospital dietitian for an appropriate substitute. Electronic Signature(s) Signed: 06/09/2014 12:32:56 PM By: Evlyn KannerBritto, Mckenna Boruff [name], FACS Signed: 06/09/2014 4:34:18 PM By: Curtis Sitesorthy, Joanna Entered By: Curtis Sitesorthy, Joanna on 06/09/2014 09:55:57 [name], [name] ([id number]) -------------------------------------------------------------------------------- Problem List Details Patient Name: [name], Jaris L. Date of Service: 06/09/2014 8:45 AM Medical Record Number: [id number] Patient Account Number: [id number] Date of Birth/Sex: 17-Jan-1921 69(81 y.o. 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 ([id number]) -------------------------------------------------------------------------------- Progress Note Details Patient Name: Berrones, Gifford L. Date of Service: 06/09/2014 8:45 AM Medical Record Number: [id number] Patient Account Number: [id number] Date of Birth/Sex: 17-Jan-1921 4(81 y.o. Male) Treating RN: Primary Care Physician: [name], [name] Other Clinician: Referring Physician: [name], [name] Treating Physician/Extender: Rudene ReBritto, Serinity Ware Weeks in Treatment: 6 Subjective Chief Complaint Information obtained from Patient Patient presents to the wound care center for a consult due non healing wound 81 year old gentleman who comes with a history of having some ulcerated areas on his heels since February 2016 and then a large injury to his left posterior heel and ankle since about a month. History of Present Illness (  HPI) 81 year old gentleman who was known to be a diabetic for many years has peripheral neuropathy recently went to his primary care doctor for a punctured wound on his left heel which was something he had noticed. The patient then was referred to a podiatrist who referred him to Dr. Wyn Quaker in the vascular surgery department and I understand the procedure was done on 03/14/2014 with a left lower extremity vascular procedure and stenting was done. Details of this are not available at the present time. The patient has been applying Neosporin to his leg and has not had any wound care addressed so far. patient  has also had a history of coronary artery disease in the past and hasn't had a CABG and a pacemaker placement in the remote past.Other details and notes are pending. the patient is not in pain lives alone and has some home health and other aides coming to help him with his daily chores and meals. reviewing the vascular notes I understand the procedure was done on 03/14/2014 and he was operated by Dr. Wyn Quaker. he had a catheter placement to the left peroneal artery and a aortogram and selective left lower extremity angiogram was done. He also had a percutaneous transluminal angioplasty of the left peroneal artery and the tibioperoneal trunk. The distal SFA and above-knee popliteal artery were also angioplastied. A subcutaneous stent placement to the distal superficial femoral artery was also done. 05/05/2014 -- the patient has not had any change in his health and after much consideration is decided that he does not want HBOT as he is claustrophobic and was unable to tolerate being in the chamber for 90 minutes. I have discussed with him that his most recent x-ray of the foot shows that he has the distal phalanx of the left great toe showing changes with osteomyelitis cannot be excluded at that site. He tells me that he cannot have an MRI because of his defibrillator and hence we will order a triple phase bone scan. I have also reviewed his culture report which shows several organisms and he has sensitivity to tetracycline and we have recommended he takes this for 14 days and this has been given to him on 05/02/2014 05/12/2014 the bone scan done on 05/10/2014 shows #1 findings are worrisome for osteomyelitis involving the calcaneus of the left foot, #2 increase uptake localizing to the left second and third toe on all 3 phases. Cannot rule out osteomyelitis in this area. And #3 left foot cellulitis. 05/19/2014 -- reviewed several reports which we have received back on Everrett Coombe. #1 chest x-ray done  on April 21 shows chronic changes in the left base no acute findings. Macneill, Masaichi L. ([id number]) #2 his CBC is within normal limits. #3 hemoglobin A1c was 8.5%. #4 EKG done on 26 April was within normal limits due to his electronic ventricular pacemaker. 05/26/2014 -- he has had his PICC line placed and is taking vancomycin daily basis. He is to start hyperbaric oxygen therapy on this coming Monday. 06/09/2014 -- he saw Dr. Sampson Goon yesterday and besides his IV antibiotic I believe a oral antibiotic was also given and this may be Cipro. Patient is feeling fine otherwise does not have any symptoms though his blood pressure this morning in the wound center has been 90/50. We will check his blood pressure in the supine position. Objective Constitutional Pulse regular. Respirations normal and unlabored. Afebrile. Vitals Time Taken: 9:05 AM, Height: 70 in, Weight: 200 lbs, BMI: 28.7, Temperature: 97.9 F, Pulse: 85 bpm, Respiratory  Rate: 18 breaths/min, Blood Pressure: 87/34 mmHg, Capillary Blood Glucose: 233 mg/dl. General Notes: [name] Notified of BP 87/37, manual 90/40. [name] stated to have patient ay flat for 5 minutes and retake pressure before sending him for his HBO treatment. Case Manager notified and will follow through with [name] request. Eyes Nonicteric. Reactive to light. Ears, Nose, Mouth, and Throat Lips, teeth, and gums WNL.Marland Kitchen Moist mucosa without lesions . Neck supple and nontender. No palpable supraclavicular or cervical adenopathy. Normal sized without goiter. Respiratory WNL. No retractions.. Cardiovascular no palpable ankle pulses. No clubbing, cyanosis or edema. Psychiatric Judgement and insight Intact.. No evidence of depression, anxiety, or agitation.. Integumentary (Hair, Skin) Funderburke, Kameran L. ([id number]) all the wounds have minimal slough and this is sharply debrided with a curette. The posterior ankle has a lot of debris and some part of his cartilage and ligament  needs to be sharply debrided.. No crepitus or fluctuance. No peri-wound warmth or erythema. No masses.. Wound #1 status is Open. Original cause of wound was Gradually Appeared. The wound is located on the Left Achilles. The wound measures 7.5cm length x 4.3cm width x 0.7cm depth; 25.329cm^2 area and 17.73cm^3 volume. There is tendon exposed. There is undermining starting at 11:00 and ending at 1:00 with a maximum distance of 1.5cm. There is a medium amount of serous drainage noted. The wound margin is epibole. There is small (1-33%) pink granulation within the wound bed. There is a large (67-100%) amount of necrotic tissue within the wound bed including Eschar and Adherent Slough. The periwound skin appearance exhibited: Moist, Rubor, Erythema. The periwound skin appearance did not exhibit: Callus, Crepitus, Excoriation, Fluctuance, Friable, Induration, Localized Edema, Rash, Scarring, Dry/Scaly, Maceration, Atrophie Blanche, Cyanosis, Ecchymosis, Hemosiderin Staining, Mottled, Pallor. The surrounding wound skin color is noted with erythema which is circumferential. Periwound temperature was noted as No Abnormality. Wound #2 status is Open. Original cause of wound was Gradually Appeared. The wound is located on the Left,Lateral Calcaneous. The wound measures 0.8cm length x 4cm width x 0.4cm depth; 2.513cm^2 area and 1.005cm^3 volume. The wound is limited to skin breakdown. There is no tunneling noted, however, there is undermining starting at 1:00 and ending at 2:00 with a maximum distance of 0.2cm. There is a small amount of serous drainage noted. The wound margin is distinct with the outline attached to the wound base. There is small (1-33%) pink granulation within the wound bed. There is a medium (34-66%) amount of necrotic tissue within the wound bed including Adherent Slough. The periwound skin appearance exhibited: Moist. The periwound skin appearance did not exhibit: Callus, Crepitus,  Excoriation, Fluctuance, Friable, Induration, Localized Edema, Rash, Scarring, Dry/Scaly, Maceration, Atrophie Blanche, Cyanosis, Ecchymosis, Hemosiderin Staining, Mottled, Pallor, Rubor, Erythema. Periwound temperature was noted as No Abnormality. The periwound has tenderness on palpation. Wound #3 status is Open. Original cause of wound was Gradually Appeared. The wound is located on the Left Toe Great. The wound measures 0.8cm length x 0.8cm width x 0.1cm depth; 0.503cm^2 area and 0.05cm^3 volume. The wound is limited to skin breakdown. There is no tunneling or undermining noted. There is a none present amount of drainage noted. The wound margin is distinct with the outline attached to the wound base. There is no granulation within the wound bed. There is a large (67-100%) amount of necrotic tissue within the wound bed including Eschar. The periwound skin appearance exhibited: Dry/Scaly. The periwound skin appearance did not exhibit: Callus, Crepitus, Excoriation, Fluctuance, Friable, Induration, Localized Edema, Rash, Scarring, Maceration, Moist,  Atrophie Blanche, Cyanosis, Ecchymosis, Hemosiderin Staining, Mottled, Pallor, Rubor, Erythema. Periwound temperature was noted as No Abnormality. All the wounds have minimal slough and this is sharply debrided with a curette. The posterior ankle has a lot of debris and some part of his cartilage and ligament needs to be sharply debrided. 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 If his supine blood pressure is still low we will have to abort his age HBOT treatment today and ask him to see his PCP. I will review him again after his supine blood pressure has been checked out before taking a decision regarding hyperbaric oxygen therapy. Though the progress is slow I believe he is benefiting from HBOT and we shall continue this over the treatment course. Wound check will be done next week again. 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 There was a Skin/Subcutaneous Tissue Debridement (40981-19147) debridement with total area of 15 sq cm performed by Tristan Schroeder., [name]. with the following instrument(s): Curette, Forceps, and Scissors to remove Viable and Non-Viable tissue/material including Fibrin/Slough, Ligament, Skin, and Subcutaneous after achieving pain control using Lidocaine 4% Topical Solution. A time out was conducted prior to the start of the procedure. A Minimum amount of bleeding was controlled with Pressure. The procedure was tolerated well with a pain level of 0 throughout and a pain level of 0 following the procedure. Post Debridement Measurements: 7.5cm length x 4.3cm width x 0.7cm depth; 17.73cm^3 volume. Wound #2 Wound #2 is an Arterial Insufficiency Ulcer located on the Left,Lateral Calcaneous . There was a Skin/Subcutaneous Tissue Debridement ([id number]) debridement with total area of 3.2 sq cm performed by Tristan Schroeder., [name]. with the following instrument(s): Curette to remove Viable and Non-Viable tissue/material including Fibrin/Slough and Subcutaneous after achieving pain control using Lidocaine 4% Topical Solution. A time out was conducted prior to the start of the procedure. A Minimum amount of bleeding was controlled with Pressure. The procedure was tolerated well with a pain level of 0 throughout and a pain level of 0 following the procedure. Post Debridement  Measurements: 0.8cm length x 4cm width x 0.4cm depth; 1.005cm^3 volume. Wound #3 Wound #3 is an Arterial Insufficiency Ulcer located on the Left Toe Great . There was a Skin/Subcutaneous Tissue Debridement ([id number]) debridement with total area of 0.64 sq cm performed by Tristan Schroeder., [name]. with the following instrument(s): Curette to remove Viable and Non-Viable tissue/material including Galli, Aidian L. ([id number]) Fibrin/Slough, Eschar, and Subcutaneous after achieving pain control using Lidocaine 4% Topical Solution. A time out was conducted prior to the start of the procedure. A Minimum amount of bleeding was controlled with Pressure. The procedure was tolerated well with a pain level of 0 throughout and a pain level of 0 following the procedure. Post Debridement Measurements: 0.8cm length x 0.8cm width x 0.1cm depth; 0.05cm^3 volume. Plan Wound Cleansing: Wound #1 Left Achilles: Clean wound with Normal Saline. May shower with protection. Wound #2 Left,Lateral Calcaneous: Clean wound with Normal Saline. May shower with protection. Wound #3  Left Toe Great: Clean wound with Normal Saline. May shower with protection. Anesthetic: Wound #1 Left Achilles: Topical Lidocaine 4% cream applied to wound bed prior to debridement Wound #2 Left,Lateral Calcaneous: Topical Lidocaine 4% cream applied to wound bed prior to debridement Wound #3 Left Toe Great: Topical Lidocaine 4% cream applied to wound bed prior to debridement Skin Barriers/Peri-Wound Care: Wound #1 Left Achilles: Skin Prep Wound #2 Left,Lateral Calcaneous: Skin Prep Wound #3 Left Toe Great: Skin Prep Primary Wound Dressing: Wound #1 Left Achilles: Santyl Ointment Wound #2 Left,Lateral Calcaneous: Aquacel Ag Wound #3 Left Toe Great: Aquacel Ag Secondary Dressing: Wound #1 Left Achilles: Gauze, ABD and Kerlix/Conform Wound #2 Left,Lateral Calcaneous: Gauze, ABD and Kerlix/Conform Wound #3 Left Toe  Great: Gauze, ABD and Kerlix/Conform Jacome, Jaiyden L. ([id number]) Dressing Change Frequency: Wound #1 Left Achilles: Change dressing every day. Wound #2 Left,Lateral Calcaneous: Change dressing every day. Wound #3 Left Toe Great: Change dressing every day. Follow-up Appointments: Wound #1 Left Achilles: Return Appointment in 1 week. Wound #2 Left,Lateral Calcaneous: Return Appointment in 1 week. Wound #3 Left Toe Great: Return Appointment in 1 week. Additional Orders / Instructions: Wound #1 Left Achilles: Increase protein intake. Other: - goal is to keep blood sugars under 180 Wound #2 Left,Lateral Calcaneous: Increase protein intake. Other: - goal is to keep blood sugars under 180 Wound #3 Left Toe Great: Increase protein intake. Other: - goal is to keep blood sugars under 180 Home Health: Wound #1 Left Achilles: Continue Home Health Visits - [facility] Nurse may visit PRN to address patient s wound care needs. FACE TO FACE ENCOUNTER: MEDICARE and MEDICAID PATIENTS: I certify that this patient is under my care and that I had a face-to-face encounter that meets the physician face-to-face encounter requirements with this patient on this date. The encounter with the patient was in whole or in part for the following MEDICAL CONDITION: (primary reason for Home Healthcare) MEDICAL NECESSITY: I certify, that based on my findings, NURSING services are a medically necessary home health service. HOME BOUND STATUS: I certify that my clinical findings support that this patient is homebound (i.e., Due to illness or injury, pt requires aid of supportive devices such as crutches, cane, wheelchairs, walkers, the use of special transportation or the assistance of another person to leave their place of residence. There is a normal inability to leave the home and doing so requires considerable and taxing effort. Other absences are for medical reasons / religious services  and are infrequent or of short duration when for other reasons). If current dressing causes regression in wound condition, may D/C ordered dressing product/s and apply Normal Saline Moist Dressing daily until next Wound Healing Center / Other [name] appointment. Notify Wound Healing Center of regression in wound condition at [phone]. Please direct any NON-WOUND related issues/requests for orders to patient's Primary Care Physician Wound #2 Left,Lateral Calcaneous: Continue Home Health Visits - [facility] Nurse may visit PRN to address patient s wound care needs. FACE TO FACE ENCOUNTER: MEDICARE and MEDICAID PATIENTS: I certify that this patient is under my care and that I had a face-to-face encounter that meets the physician face-to-face encounter requirements with this patient on this date. The encounter with the patient was in whole or in part for the following MEDICAL CONDITION: (primary reason for Home Healthcare) MEDICAL NECESSITY: I certify, that based on my findings, NURSING services are a medically necessary home health service. HOME BOUND STATUS: I certify that  my clinical findings support that this patient is homebound (i.e., Due to FRANKO, HILLIKER ([id number]) illness or injury, pt requires aid of supportive devices such as crutches, cane, wheelchairs, walkers, the use of special transportation or the assistance of another person to leave their place of residence. There is a normal inability to leave the home and doing so requires considerable and taxing effort. Other absences are for medical reasons / religious services and are infrequent or of short duration when for other reasons). If current dressing causes regression in wound condition, may D/C ordered dressing product/s and apply Normal Saline Moist Dressing daily until next Wound Healing Center / Other [name] appointment. Notify Wound Healing Center of regression in wound condition at [phone]. Please  direct any NON-WOUND related issues/requests for orders to patient's Primary Care Physician Wound #3 Left Toe Great: Continue Home Health Visits - [facility] Nurse may visit PRN to address patient s wound care needs. FACE TO FACE ENCOUNTER: MEDICARE and MEDICAID PATIENTS: I certify that this patient is under my care and that I had a face-to-face encounter that meets the physician face-to-face encounter requirements with this patient on this date. The encounter with the patient was in whole or in part for the following MEDICAL CONDITION: (primary reason for Home Healthcare) MEDICAL NECESSITY: I certify, that based on my findings, NURSING services are a medically necessary home health service. HOME BOUND STATUS: I certify that my clinical findings support that this patient is homebound (i.e., Due to illness or injury, pt requires aid of supportive devices such as crutches, cane, wheelchairs, walkers, the use of special transportation or the assistance of another person to leave their place of residence. There is a normal inability to leave the home and doing so requires considerable and taxing effort. Other absences are for medical reasons / religious services and are infrequent or of short duration when for other reasons). If current dressing causes regression in wound condition, may D/C ordered dressing product/s and apply Normal Saline Moist Dressing daily until next Wound Healing Center / Other [name] appointment. Notify Wound Healing Center of regression in wound condition at [phone]. Please direct any NON-WOUND related issues/requests for orders to patient's Primary Care Physician Hyperbaric Oxygen Therapy: Wound #3 Left Toe Great: Indication: - osteomyelitis If appropriate for treatment, begin HBOT per protocol: 2.0 ATA for 90 Minutes without Air Breaks One treatment per day (delivered Monday through Friday unless otherwise specified in Special Instructions  below): Total # of Treatments: - 40 Finger stick Blood Glucose Pre- and Post- HBOT Treatment. Follow Hyperbaric Oxygen Glycemia Protocol HBO Contraindications: Wound #3 Left Toe Great: HBO contraindications of hyperbaric oxygen therapy were reviewed and the patient found to have no untreated pneumothorax or history of spontaneous pneumothorax. HBO contraindications of hyperbaric oxygen therapy were reviewed and the patient found to have no history of medications such as Bleomycin, Adriamycin, disulfiram, cisplatin and sulfamylon and is not currently receiving any chemotherapy. HBO contraindications of hyperbaric oxygen therapy were reviewed and the patient found to have no Upper respiratory infection and chronic sinusitis. HBO contraindications of hyperbaric oxygen therapy were reviewed and the patient found to have no history of retinal surgery proceeding 6 weeks or intraocular gas HBO contraindications of hyperbaric oxygen therapy were reviewed and the patient found to have no history seizure disorder or any anticonvulsant medication. HBO contraindications of hyperbaric oxygen therapy were reviewed and the patient found to have no septicemia with CO2 retention. HBO contraindications of hyperbaric oxygen therapy were  reviewed and the patient found to have no fever Geter, Declan L. ([id number]) greater than 100 degrees. HBO contraindications of hyperbaric oxygen therapy were reviewed and the patient found to have no pregnancy noted. HBO contraindications of hyperbaric oxygen therapy were reviewed and the patient found to have no medications such as steroids or narcotics or Phenergan. Medications-please add to medication list.: Wound #1 Left Achilles: Santyl Enzymatic Ointment Wound #2 Left,Lateral Calcaneous: Santyl Enzymatic Ointment The posterior ankle will require Santyl but the rest of the wounds will benefit from silver alginate. If his supine blood pressure is still low we will  have to abort his age HBOT treatment today and ask him to see his PCP. I will review him again after his supine blood pressure has been checked out before taking a decision regarding hyperbaric oxygen therapy. Though the progress is slow I believe he is benefiting from HBOT and we shall continue this over the treatment course. Wound check will be done next week again. Electronic Signature(s) Signed: 06/09/2014 4:37:19 PM By: Evlyn Kanner [name], FACS Previous Signature: 06/09/2014 12:32:56 PM Version By: Evlyn Kanner [name], FACS Entered By: Evlyn Kanner on 06/09/2014 16:36:10 Darnold, [name] ([id number]) -------------------------------------------------------------------------------- SuperBill Details Patient Name: Dowling, Carla L. Date of Service: 06/09/2014 Medical Record Number: [id number] Patient Account Number: [id number] Date of Birth/Sex: September 05, 1920 (81 y.o. 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 M86.372 Chronic multifocal osteomyelitis, left ankle and foot Facility Procedures CPT4: Description Modifier Quantity Code [id number] 11042 - DEB SUBQ TISSUE 20 SQ CM/< 1 ICD-10 Description Diagnosis E11.621 Type 2 diabetes mellitus with foot ulcer E11.52 Type 2 diabetes mellitus with diabetic peripheral angiopathy with gangrene I70.244  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 Physician Procedures CPT4:  Description Modifier Quantity Code [id number] 11042 - WC PHYS SUBQ TISS 20 SQ CM 1 ICD-10 Description Diagnosis E11.621 Type 2 diabetes mellitus with foot ulcer E11.52 Type 2 diabetes mellitus with diabetic peripheral angiopathy with gangrene I70.244  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

## 2014-06-11 NOTE — Progress Notes (Signed)
AUDEL, COAKLEY ([id number]) Visit Report for 06/10/2014 HBO Details Patient Name: [name], [name] Date of Service: 06/10/2014 10:00 AM Medical Record Number: [id number] Patient Account Number: [id number] Date of Birth/Sex: 12-Jun-1920 (81 y.o. 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 HBO Treatment Details Treatment Number: 10 Patient Type: Outpatient Chamber Type: Monoplace Chamber #: [id number] Treatment Protocol: 2.0 ATA with 90 minutes oxygen, and no air breaks Treatment Details Compression Rate Down: De-Compression Rate Up: 1.5 psi / minute Air breaks and breathing Compress Tx Pressure Decompress Decompress periods Begins Reached Begins Ends (leave unused spaces blank) Chamber Pressure 1 ATA 2.0 ATA - - - - - - 2.0 ATA 1 ATA Clock Time (24 hr) 10:21 10:33 - - - - - - 12:03 12:15 Treatment Length: 114 (minutes) Treatment Segments: 4 Capillary Blood Glucose Pre Capillary Blood Glucose (mg/dl): Post Capillary Blood Glucose (mg/dl): Vital Signs Capillary Blood Glucose Reference Range: 80 - 120 mg / dl HBO Diabetic Blood Glucose Intervention Range: <131 mg/dl or >132 mg/dl Time Vitals Blood Respiratory Capillary Blood Glucose Pulse Action Type: Pulse: Temperature: Taken: Pressure: Rate: Glucose (mg/dl): Meter #: Oximetry (%) Taken: Pre 09:24 101/58 78 18 97.8 299 1 none Post 12:22 100/62 78 18 98.3 164 1 none Treatment Response Well [name], [name] ([id number]) Treatment Toleration: Treatment Treatment Completed without Adverse Event Completion  Status: Electronic Signature(s) Signed: 06/10/2014 3:16:07 PM By: Dayton Martes RCP, RRT, CHT Signed: 06/10/2014 3:45:17 PM By: Evlyn Kanner MD, FACS Previous Signature: 06/10/2014 12:33:17 PM Version By: Evlyn Kanner MD, FACS Entered By: Dayton Martes on 06/10/2014 12:33:19 [name], [name] ([id number]) -------------------------------------------------------------------------------- HBO Safety Checklist Details Patient Name: [name], [name] Date of Service: 06/10/2014 10:00 AM Medical Record Number: [id number] Patient Account Number: [id number] Date of Birth/Sex: [birth year] (81 y.o. Male) Treating RN: Primary Care Physician: Lindwood Qua Other Clinician: Izetta Dakin Referring Physician: Lindwood Qua Treating Physician/Extender: Rudene Re in Treatment: 6 HBO Safety Checklist Items Safety Checklist Consent Form Signed Patient voided / foley secured and emptied When did you last eato 07:00 am Last dose of injectable or oral agent 07:00 am Ostomy pouch emptied and vented if applicable n/a All implantable devices assessed, documented and approved Pacemaker Intravenous access site secured and place PICC line - left arm Valuables secured Linens and cotton and cotton/polyester blend (less than 51% polyester) Personal oil-based products / skin lotions / body lotions removed Wigs or hairpieces removed Smoking or tobacco materials removed Books / newspapers / magazines / loose paper removed Cologne, aftershave, perfume and deodorant removed Jewelry removed (may wrap wedding band) Make-up removed n/a Hair care products removed Battery operated devices (external) removed Heating patches and chemical warmers removed Titanium eyewear removed Nail polish cured greater than 10 hours n/a Casting material cured greater than 10 hours n/a Hearing aids removed Loose dentures or partials removed Prosthetics have been removed n/a Patient  demonstrates correct use of air break device (if applicable) Patient concerns have been addressed Patient grounding bracelet on and cord attached to chamber Specifics for Inpatients (complete in addition to above) Medication sheet sent with patient Intravenous medications needed or due during therapy sent with patient [name], [name] ([id number]) Drainage tubes (e.g. nasogastric tube  or chest tube secured and vented) Endotracheal or Tracheotomy tube secured Cuff deflated of air and inflated with saline Airway suctioned Electronic Signature(s) Signed: 06/10/2014 3:16:07 PM By: Dayton MartesWallace, RCP,RRT,CHT, Sallie RCP, RRT, CHT Entered By: Dayton MartesWallace, RCP,RRT,CHT, Sallie on 06/10/2014 09:30:41

## 2014-06-13 ENCOUNTER — Encounter: Payer: Medicare Other | Admitting: Surgery

## 2014-06-13 DIAGNOSIS — L97323 Non-pressure chronic ulcer of left ankle with necrosis of muscle: Secondary | ICD-10-CM | POA: Diagnosis not present

## 2014-06-13 LAB — GLUCOSE, CAPILLARY
GLUCOSE-CAPILLARY: 125 mg/dL — AB (ref 65–99)
Glucose-Capillary: 261 mg/dL — ABNORMAL HIGH (ref 65–99)

## 2014-06-13 NOTE — Progress Notes (Signed)
[name], Lundy L. ([id number]) Visit Report for 06/13/2014 Arrival Information Details Patient Name: [name], [name] Date of Service: 06/13/2014 10:00 AM Medical Record Number: [id number] Patient Account Number: [id number] Date of Birth/Sex: July 09, 1920 [age] y.o. Male) Treating RN: Primary Care Physician: [name], [name] Other Clinician: Izetta DakinWallace, Sallie Referring Physician: [name], [name] Treating Physician/Extender: Rudene ReBritto, Errol Weeks in Treatment: 6 Visit Information History Since Last Visit Added or deleted any medications: No Patient Arrived: Wheel Chair Any new allergies or adverse reactions: No Arrival Time: 09:25 Had a fall or experienced change in No activities of daily living that may affect Accompanied By: Reita ClicheBobby, risk of falls: son Signs or symptoms of abuse/neglect No Transfer Assistance: Manual since last visito Patient Identification Verified: Yes Hospitalized since last visit: No Secondary Verification Process Yes Has Dressing in Place as Prescribed: Yes Completed: Has Footwear/Offloading in Place as Yes Patient Requires Transmission-Based No Prescribed: Precautions: Left: Surgical Shoe with Patient Has Alerts: No Pressure Relief Insole Pain Present Now: No Electronic Signature(s) Signed: 06/13/2014 2:24:56 PM By: Dayton MartesWallace, RCP,RRT,CHT, Sallie RCP, RRT, CHT Entered By: Dayton MartesWallace, RCP,RRT,CHT, Sallie on 06/13/2014 09:35:37 Sanker, Raoul [name] ([id number]) -------------------------------------------------------------------------------- Encounter Discharge Information Details Patient Name: [name], [name] Date of Service: 06/13/2014 10:00 AM Medical Record Number: [id number] Patient Account Number: [id number] Date of Birth/Sex: July 09, 1920 [age] y.o. Male) Treating RN: Primary Care Physician: [name], [name] Other Clinician: Izetta DakinWallace, Sallie Referring Physician: [name], [name] Treating Physician/Extender: Rudene ReBritto, Errol Weeks in Treatment: 6 Encounter Discharge Information  Items Discharge Pain Level: 0 Discharge Condition: Stable Ambulatory Status: Wheelchair Discharge Destination: Home Private Transportation: Auto Accompanied By: Reita ClicheBobby, son Schedule Follow-up Appointment: No Medication Reconciliation completed and No provided to Patient/Care Barnabas Henriques: Clinical Summary of Care: Notes Patient has an HBO treatment scheduled on 06/14/14 at 10:00 am. Electronic Signature(s) Signed: 06/13/2014 2:24:56 PM By: Dayton MartesWallace, RCP,RRT,CHT, Sallie RCP, RRT, CHT Entered By: Dayton MartesWallace, RCP,RRT,CHT, Sallie on 06/13/2014 14:23:41 Damore, Raoul [name] ([id number]) -------------------------------------------------------------------------------- Vitals Details Patient Name: [name], [name] Date of Service: 06/13/2014 10:00 AM Medical Record Number: [id number] Patient Account Number: [id number] Date of Birth/Sex: July 09, 1920 [age] y.o. Male) Treating RN: Primary Care Physician: [name], [name] Other Clinician: Izetta DakinWallace, Sallie Referring Physician: [name], [name] Treating Physician/Extender: Rudene ReBritto, Errol Weeks in Treatment: 6 Vital Signs Time Taken: 09:26 Temperature (F): 97.9 Height (in): 70 Pulse (bpm): 84 Weight (lbs): 200 Respiratory Rate (breaths/min): 20 Body Mass Index (BMI): 28.7 Blood Pressure (mmHg): 116/54 Capillary Blood Glucose (mg/dl): 536261 Reference Range: 80 - 120 mg / dl Electronic Signature(s) Signed: 06/13/2014 2:24:56 PM By: Dayton MartesWallace, RCP,RRT,CHT, Sallie RCP, RRT, CHT Entered By: Dayton MartesWallace, RCP,RRT,CHT, Sallie on 06/13/2014 09:36:09

## 2014-06-14 ENCOUNTER — Encounter: Payer: Medicare Other | Admitting: Surgery

## 2014-06-14 DIAGNOSIS — L97323 Non-pressure chronic ulcer of left ankle with necrosis of muscle: Secondary | ICD-10-CM | POA: Diagnosis not present

## 2014-06-14 LAB — GLUCOSE, CAPILLARY
Glucose-Capillary: 242 mg/dL — ABNORMAL HIGH (ref 65–99)
Glucose-Capillary: 151 mg/dL — ABNORMAL HIGH (ref 65–99)

## 2014-06-14 NOTE — Progress Notes (Signed)
[name], Calen L. ([id number]) Visit Report for 06/14/2014 Arrival Information Details Patient Name: [name], [name] Date of Service: 06/14/2014 10:00 AM Medical Record Number: [id number] Patient Account Number: [id number] Date of Birth/Sex: [birth year] [age] y.o. Male) Treating RN: Huel CoventryWoody, Kim Primary Care Physician: [name], [name] Other Clinician: Izetta DakinWallace, Sallie Referring Physician: [name], [name] Treating Physician/Extender: Rudene ReBritto, Errol Weeks in Treatment: 6 Visit Information History Since Last Visit Added or deleted any medications: No Patient Arrived: Wheel Chair Any new allergies or adverse reactions: No Arrival Time: 09:28 Had a fall or experienced change in No activities of daily living that may affect Accompanied By: son risk of falls: Transfer Assistance: Manual Signs or symptoms of abuse/neglect since last No Patient Identification Verified: Yes visito Secondary Verification Process Yes Hospitalized since last visit: No Completed: Has Dressing in Place as Prescribed: Yes Patient Requires Transmission-Based No Pain Present Now: No Precautions: Patient Has Alerts: No Electronic Signature(s) Signed: 06/14/2014 2:33:21 PM By: Elliot GurneyWoody, RN, BSN, Kim RN, BSN Entered By: Elliot GurneyWoody, RN, BSN, Kim on 06/14/2014 69:62:9509:28:44 [name], [name] ([id number]) -------------------------------------------------------------------------------- Encounter Discharge Information Details Patient Name: [name], [name] Date of Service: 06/14/2014 10:00 AM Medical Record Number: [id number] Patient Account Number: [id number] Date of Birth/Sex: [birth year](81 y.o. Male) Treating RN: Huel CoventryWoody, Kim Primary Care Physician: [name], [name] Other Clinician: Izetta DakinWallace, Sallie Referring Physician: [name], [name] Treating Physician/Extender: Rudene ReBritto, Errol Weeks in Treatment: 6 Encounter Discharge Information Items Discharge Pain Level: 0 Discharge Condition: Stable Ambulatory Status: Wheelchair Discharge  Destination: Home Private Transportation: Auto Accompanied By: son Schedule Follow-up Appointment: Yes Medication Reconciliation completed and Yes provided to Patient/Care Gemayel Mascio: Clinical Summary of Care: Electronic Signature(s) Signed: 06/14/2014 2:33:21 PM By: Elliot GurneyWoody, RN, BSN, Kim RN, BSN Entered By: Elliot GurneyWoody, RN, BSN, Kim on 06/14/2014 12:41:04 [name], [name] ([id number]) -------------------------------------------------------------------------------- [name], [name] Date of Service: 06/14/2014 10:00 AM Medical Record Number: [id number] Patient Account Number: [id number] Date of Birth/Gender: [birth year] [age] y.o. Male) Treating RN: Huel CoventryWoody, Kim Primary Care Physician: [name], [name] Other Clinician: Izetta DakinWallace, Sallie Referring Physician: [name], [name] Treating Physician/Extender: Rudene ReBritto, Errol Weeks in Treatment: 6 Education Assessment Education Provided To: Patient Education Topics Provided Elevated Blood Sugar/ Impact on Healing: Handouts: Elevated Blood Sugars: How Do They Affect Wound Healing Methods: Explain/Verbal Responses: State content correctly Hyperbaric Oxygenation: Handouts: Hyperbaric Oxygen Methods: Demonstration, Explain/Verbal Responses: State content correctly Electronic Signature(s) Signed: 06/14/2014 2:33:21 PM By: Elliot GurneyWoody, RN, BSN, Kim RN, BSN Entered By: Elliot GurneyWoody, RN, BSN, Kim on 06/14/2014 12:40:46 [name], [name] ([id number]) -------------------------------------------------------------------------------- Vitals Details Patient Name: [name], [name] Date of Service: 06/14/2014 10:00 AM Medical Record Number: [id number] Patient Account Number: [id number] Date of Birth/Sex: [birth year] [age] y.o. Male) Treating RN: Huel CoventryWoody, Kim Primary Care Physician: [name], [name] Other Clinician: Izetta DakinWallace, Sallie Referring Physician: [name], [name] Treating Physician/Extender: Rudene ReBritto, Errol Weeks in Treatment: 6 Vital  Signs Time Taken: 09:30 Temperature (F): 97.7 Height (in): 70 Pulse (bpm): 88 Weight (lbs): 200 Respiratory Rate (breaths/min): 18 Body Mass Index (BMI): 28.7 Blood Pressure (mmHg): 132/68 Capillary Blood Glucose (mg/dl): 010242 Reference Range: 80 - 120 mg / dl Electronic Signature(s) Signed: 06/14/2014 2:33:21 PM By: Elliot GurneyWoody, RN, BSN, Kim RN, BSN Entered By: Elliot GurneyWoody, RN, BSN, Kim on 06/14/2014 09:45:48

## 2014-06-14 NOTE — Progress Notes (Signed)
[name], [name] ([id number]) Visit Report for 06/13/2014 HBO Details Patient Name: [name], [name] Date of Service: 06/13/2014 10:00 AM Medical Record Number: [id number] Patient Account Number: [id number] Date of Birth/Sex: [birth year](81 y.o. 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 HBO Treatment Details Treatment Number: 11 Patient Type: Outpatient Chamber Type: Monoplace Chamber #: [id number] Treatment Protocol: 2.0 ATA with 90 minutes oxygen, and no air breaks Treatment Details Compression Rate Down: 1.5 psi / minute De-Compression Rate Up: 1.5 psi / minute Air breaks and breathing Compress Tx Pressure Decompress Decompress periods Begins Reached Begins Ends (leave unused spaces blank) Chamber Pressure 1 ATA 2.0 ATA - - - - - - 2.0 ATA 1 ATA Clock Time (24 hr) 10:24 10:36 - - - - - - 12:06 12:17 Treatment Length: 113 (minutes) Treatment Segments: 4 Capillary Blood Glucose Pre Capillary Blood Glucose (mg/dl): Post Capillary Blood Glucose (mg/dl): Vital Signs Capillary Blood Glucose Reference Range: 80 - 120 mg / dl HBO Diabetic Blood Glucose Intervention Range: <131 mg/dl or >846>249 mg/dl Time Vitals Blood Respiratory Capillary Blood Glucose Pulse Action Type: Pulse: Temperature: Taken: Pressure: Rate: Glucose (mg/dl): Meter #: Oximetry (%) Taken: Pre 09:26 116/54 84 20 97.9 261 1 none Post 12:22 96/5/8 72 18 97.9 125 1 none Treatment Response Well Zolman, Drakkar L. ([id number]) Treatment Toleration: Treatment Treatment Completed without Adverse  Event Completion Status: HBO Attestation I certify that I supervised this HBO treatment in accordance with Medicare guidelines. A trained Yes emergency response team is readily available per hospital policies and procedures. Continue HBOT as ordered. Yes Electronic Signature(s) Signed: 06/13/2014 2:24:56 PM By: Sallee ProvencalWallace, RCP,RRT,CHT, Sallie RCP, RRT, CHT Signed: 06/13/2014 5:41:46 PM By: Evlyn KannerBritto, Errol MD, FACS Previous Signature: 06/13/2014 12:55:50 PM Version By: Evlyn KannerBritto, Errol MD, FACS Entered By: Dayton MartesWallace, RCP,RRT,CHT, Sallie on 06/13/2014 14:21:58 Poucher, Raoul [name] ([id number]) -------------------------------------------------------------------------------- HBO Safety Checklist Details Patient Name: [name], [name] Date of Service: 06/13/2014 10:00 AM Medical Record Number: [id number] Patient Account Number: [id number] Date of Birth/Sex: [birth year](81 y.o. Male) Treating RN: Primary Care Physician: [name], [name] Other Clinician: Izetta DakinWallace, Sallie Referring Physician: [name], [name] Treating Physician/Extender: Rudene ReBritto, Errol Weeks in Treatment: 6 HBO Safety Checklist Items Safety Checklist Consent Form Signed Patient voided / foley secured and emptied When did you last eato 07:00 am Last dose of injectable or oral agent 07:00 am Ostomy pouch emptied and vented if applicable n/a All implantable devices assessed, documented and approved Pacemaker Intravenous access site secured and place PICC line, left arm Valuables secured Linens and cotton and cotton/polyester blend (less than 51% polyester) Personal oil-based products / skin lotions / body lotions removed Wigs or hairpieces removed Smoking or tobacco materials removed Books / newspapers / magazines / loose paper removed Cologne, aftershave, perfume and deodorant removed Jewelry removed (may wrap wedding band) Make-up removed n/a Hair care products removed Battery operated devices (external) removed Heating patches and  chemical warmers removed Titanium eyewear removed Nail polish cured greater than 10 hours n/a Casting material cured greater than 10 hours n/a Hearing aids removed Loose dentures or partials removed Prosthetics have been removed n/a Patient demonstrates correct use of air break device (if applicable) Patient concerns have been addressed Patient grounding bracelet on  and cord attached to chamber Specifics for Inpatients (complete in addition to above) Medication sheet sent with patient Intravenous medications needed or due during therapy sent with patient NOEL, RODIER. ([id number]) Drainage tubes (e.g. nasogastric tube or chest tube secured and vented) Endotracheal or Tracheotomy tube secured Cuff deflated of air and inflated with saline Airway suctioned Electronic Signature(s) Signed: 06/13/2014 2:24:56 PM By: Dayton Martes RCP, RRT, CHT Entered By: Dayton Martes on 06/13/2014 09:37:12

## 2014-06-14 NOTE — Progress Notes (Signed)
[name], Gumecindo L. ([id number]) Visit Report for 06/14/2014 HBO Details Patient Name: [name], [name] Date of Service: 06/14/2014 10:00 AM Medical Record Number: [id number] Patient Account Number: [id number] Date of Birth/Sex: Jan 08, 1921 [age] y.o. 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 HBO Treatment Details Treatment Number: 12 Patient Type: Outpatient Chamber Type: Monoplace Chamber #: [id number] Treatment Protocol: 2.0 ATA with 90 minutes oxygen, and no air breaks Treatment Details Compression Rate Down: 1.5 psi / minute De-Compression Rate Up: 1.5 psi / minute Air breaks and breathing Compress Tx Pressure Decompress Decompress periods Begins Reached Begins Ends (leave unused spaces blank) Chamber Pressure 1 ATA 2.0 ATA - - - - - - 2.0 ATA 1 ATA Clock Time (24 hr) 10:31 10:43 - - - - - - 12:13 12:25 Treatment Length: 114 (minutes) Treatment Segments: 4 Capillary Blood Glucose Pre Capillary Blood Glucose (mg/dl): Post Capillary Blood Glucose (mg/dl): Vital Signs Capillary Blood Glucose Reference Range: 80 - 120 mg / dl HBO Diabetic Blood Glucose Intervention Range: <131 mg/dl or >865>249 mg/dl Time Vitals Blood Respiratory Capillary Blood Glucose Pulse Action Type: Pulse: Temperature: Taken: Pressure: Rate: Glucose (mg/dl): Meter #: Oximetry (%) Taken: Pre 09:30 132/68 88 18 97.7 242 Post 10:30 120/58 88 18 98.3 151 Treatment Response Treatment Completion Status: Treatment Completed without Adverse Event [name], [name] ([id number]) HBO Attestation I  certify that I supervised this HBO treatment in accordance with Medicare guidelines. A trained Yes emergency response team is readily available per hospital policies and procedures. Continue HBOT as ordered. Yes Electronic Signature(s) Signed: 06/14/2014 2:31:23 PM By: Elliot GurneyWoody, RN, BSN, Kim RN, BSN Signed: 06/14/2014 3:17:08 PM By: Evlyn KannerBritto, Errol MD, FACS Previous Signature: 06/14/2014 1:29:55 PM Version By: Evlyn KannerBritto, Errol MD, FACS Entered By: Elliot GurneyWoody, RN, BSN, Kim on 06/14/2014 14:31:23 [name], [name] ([id number]) -------------------------------------------------------------------------------- HBO Safety Checklist Details Patient Name: [name], [name] Date of Service: 06/14/2014 10:00 AM Medical Record Number: [id number] Patient Account Number: [id number] Date of Birth/Sex: Jan 08, 1921 [age] y.o. Male) Treating RN: Huel CoventryWoody, Kim Primary Care Physician: [name], [name] Other Clinician: Izetta DakinWallace, Sallie Referring Physician: [name], [name] Treating Physician/Extender: Rudene ReBritto, Errol Weeks in Treatment: 6 HBO Safety Checklist Items Safety Checklist Consent Form Signed Patient voided / foley secured and emptied When did you last eato last night Last dose of injectable or oral agent 7:00 am Ostomy pouch emptied and vented if applicable n/a All implantable devices assessed, documented and approved Intravenous access site secured and place n/a Valuables secured Linens and cotton and cotton/polyester blend (less than 51% polyester) Personal oil-based products / skin lotions / body lotions removed Wigs or hairpieces removed Smoking or tobacco materials removed Books / newspapers / magazines / loose paper removed Cologne, aftershave, perfume and deodorant removed Jewelry removed (may wrap wedding band) Make-up removed n/a Hair care products removed Battery operated devices (external) removed Heating patches and chemical warmers removed n/a Titanium eyewear removed Nail polish cured greater than 10  hours n/a Casting material cured greater than 10 hours n/a Hearing aids removed Loose dentures or partials removed Prosthetics have been removed n/a Patient demonstrates correct use of air break device (if applicable) Patient concerns have been addressed Patient grounding bracelet on and cord attached to chamber  Specifics for Inpatients (complete in addition to above) Medication sheet sent with patient Intravenous medications needed or due during therapy sent with patient [name], LEWELLING. ([id number]) Drainage tubes (e.g. nasogastric tube or chest tube secured and vented) Endotracheal or Tracheotomy tube secured Cuff deflated of air and inflated with saline Airway suctioned Electronic Signature(s) Signed: 06/14/2014 2:33:21 PM By: Elliot Gurney, RN, BSN, Kim RN, BSN Entered By: Elliot Gurney, RN, BSN, Kim on 06/14/2014 09:45:23

## 2014-06-15 ENCOUNTER — Encounter: Payer: Medicare Other | Admitting: Surgery

## 2014-06-15 DIAGNOSIS — L97323 Non-pressure chronic ulcer of left ankle with necrosis of muscle: Secondary | ICD-10-CM | POA: Diagnosis not present

## 2014-06-15 LAB — GLUCOSE, CAPILLARY
GLUCOSE-CAPILLARY: 215 mg/dL — AB (ref 65–99)
Glucose-Capillary: 156 mg/dL — ABNORMAL HIGH (ref 65–99)

## 2014-06-15 NOTE — Progress Notes (Addendum)
KAO, CONRY ([id number]) Visit Report for 06/15/2014 HBO Details Patient Name: [name], [name] Date of Service: 06/15/2014 10:00 AM Medical Record Number: [id number] Patient Account Number: [id number] Date of Birth/Sex: 08/10/1920 (81 y.o. 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 HBO Treatment Details Treatment Number: 13 Patient Type: Outpatient Chamber Type: Monoplace Chamber #: [id number] Treatment Protocol: 2.0 ATA with 90 minutes oxygen, and no air breaks Treatment Details Compression Rate Down: 1.5 psi / minute De-Compression Rate Up: 1.5 psi / minute Air breaks and breathing Compress Tx Pressure Decompress Decompress periods Begins Reached Begins Ends (leave unused spaces blank) Chamber Pressure 1 ATA 2.0 ATA - - - - - - 2.0 ATA 1 ATA Clock Time (24 hr) 10:29 10:39 - - - - - - 12:10 12:21 Treatment Length: 112 (minutes) Treatment Segments: 4 Capillary Blood Glucose Pre Capillary Blood Glucose (mg/dl): Post Capillary Blood Glucose (mg/dl): Vital Signs Capillary Blood Glucose Reference Range: 80 - 120 mg / dl HBO Diabetic Blood Glucose Intervention Range: <131 mg/dl or >865 mg/dl Time Vitals Blood Respiratory Capillary Blood Glucose Pulse Action Type: Pulse: Temperature: Taken: Pressure: Rate: Glucose (mg/dl): Meter #: Oximetry (%) Taken: Pre 09:25 110/52 88 18 97.8 215 1 none Post 12:33 95/52 79 18 98.5 156 1 none Treatment Response Well [name], [name] Treatment Toleration: Treatment Treatment Completed without Adverse  Event Completion Status: Physician Notes Patient c/o worsening fatigue over past several days. Says that the HBO is "wearing me out", mainly b/c of travel, changing clothes etc. No other acute changes. Baseline SOB. No CP. Tolerating reg diet. AFVSS. BS 150 after HBO. AandOx3. Chest clear to auscultation bilaterally. Heart regular rate and rhythm. Abdomen soft and nontender. Patient will try to see his PCP later this week. HBO Attestation I certify that I supervised this HBO treatment in accordance with Medicare guidelines. A trained Yes emergency response team is readily available per hospital policies and procedures. Continue HBOT as ordered. Yes Electronic Signature(s) Signed: 06/15/2014 4:38:40 PM By: Madelaine Bhat MD Previous Signature: 06/15/2014 1:46:53 PM Version By: Dayton Martes RCP, RRT, CHT Entered By: Madelaine Bhat on 06/15/2014 14:05:22 [name], [name] -------------------------------------------------------------------------------- HBO Safety Checklist Details Patient Name: [name], [name] Date of Service: 06/15/2014 10:00 AM Medical Record Number: [id number] Patient Account Number: [id number] Date of Birth/Sex: [birth year] (81 y.o. Male) Treating RN: Primary Care Physician: Lindwood Qua Other Clinician: Izetta Dakin Referring Physician: Lindwood Qua Treating Physician/Extender: BURNS III, WALTER Weeks in Treatment: 6 HBO Safety Checklist Items Safety Checklist Consent Form Signed Patient voided / foley secured and emptied When did you last eato 07:00 am Last dose of injectable or oral agent 07:00 am Ostomy pouch emptied and vented if applicable n/a All implantable devices assessed, documented and approved Pacemaker Intravenous access site secured and place PICC line in left arm Valuables secured Linens and cotton and cotton/polyester blend (less than 51% polyester) Personal oil-based products / skin lotions / body  lotions removed Wigs or hairpieces removed Smoking or tobacco materials removed Books / newspapers / magazines / loose paper removed Cologne, aftershave, perfume and deodorant removed Jewelry removed (may wrap wedding band) Make-up removed n/a Hair care products removed Battery operated  devices (external) removed Heating patches and chemical warmers removed Titanium eyewear removed Nail polish cured greater than 10 hours n/a Casting material cured greater than 10 hours n/a Hearing aids removed Loose dentures or partials removed Prosthetics have been removed n/a Patient demonstrates correct use of air break device (if applicable) Patient concerns have been addressed Patient grounding bracelet on and cord attached to chamber Specifics for Inpatients (complete in addition to above) Medication sheet sent with patient Intravenous medications needed or due during therapy sent with patient [name] IllVANS, [name] Drainage tubes (e.g. nasogastric tube or chest tube secured and vented) Endotracheal or Tracheotomy tube secured Cuff deflated of air and inflated with saline Airway suctioned Electronic Signature(s) Signed: 06/15/2014 1:46:53 PM By: Dayton MartesWallace, RCP,RRT,CHT, Sallie RCP, RRT, CHT Entered By: Dayton MartesWallace, RCP,RRT,CHT, Sallie on 06/15/2014 11:02:51

## 2014-06-15 NOTE — Progress Notes (Addendum)
[name], Kendale L. ([id number]) Visit Report for 06/15/2014 Arrival Information Details Patient Name: [name], [name] Date of Service: 06/15/2014 10:00 AM Medical Record Number: [id number] Patient Account Number: [id number] Date of Birth/Sex: Sep 23, 1920 [age] y.o. Male) Treating RN: Huel CoventryWoody, Kim Primary Care Physician: [name], [name] Other Clinician: Izetta DakinWallace, Sallie Referring Physician: [name], [name] Treating Physician/Extender: BURNS III, Regis BillWALTER Weeks in Treatment: 6 Visit Information History Since Last Visit Added or deleted any medications: No Patient Arrived: Wheel Chair Any new allergies or adverse reactions: No Arrival Time: 09:50 Had a fall or experienced change in No activities of daily living that may affect Accompanied By: son, Nadine CountsBob risk of falls: Transfer Assistance: Manual Signs or symptoms of abuse/neglect since last No Patient Identification Verified: Yes visito Secondary Verification Process Yes Hospitalized since last visit: No Completed: Has Dressing in Place as Prescribed: Yes Patient Requires Transmission-Based No Pain Present Now: No Precautions: Patient Has Alerts: No Electronic Signature(s) Signed: 06/15/2014 9:51:06 AM By: Elliot GurneyWoody, RN, BSN, Kim RN, BSN Entered By: Elliot GurneyWoody, RN, BSN, Kim on 06/15/2014 09:51:05 [name], [name] ([id number]) -------------------------------------------------------------------------------- Encounter Discharge Information Details Patient Name: [name], [name] Date of Service: 06/15/2014 10:00 AM Medical Record Number: [id number] Patient Account Number: [id number] Date of Birth/Sex: Sep 23, 1920 [age] y.o. Male) Treating RN: Primary Care Physician: [name], [name] Other Clinician: Izetta DakinWallace, Sallie Referring Physician: [name], [name] Treating Physician/Extender: BURNS III, Regis BillWALTER Weeks in Treatment: 6 Encounter Discharge Information Items Discharge Pain Level: 0 Discharge Condition: Stable Ambulatory Status: Wheelchair Discharge  Destination: Home Private Transportation: Auto Accompanied By: Nadine CountsBob, son Schedule Follow-up Appointment: No Medication Reconciliation completed and No provided to Patient/Care [name]: Clinical Summary of Care: Notes Dr. Lawerance BachBurns has ordered patient's treatments to be suspended until he goes to see his primary care physician. Electronic Signature(s) Signed: 06/15/2014 1:46:53 PM By: Dayton MartesWallace, RCP,RRT,CHT, Sallie RCP, RRT, CHT Entered By: Dayton MartesWallace, RCP,RRT,CHT, Sallie on 06/15/2014 13:38:46 [name], [name] ([id number]) -------------------------------------------------------------------------------- Vitals Details Patient Name: [name], [name] Date of Service: 06/15/2014 10:00 AM Medical Record Number: [id number] Patient Account Number: [id number] Date of Birth/Sex: Sep 23, 1920 [age] y.o. Male) Treating RN: Huel CoventryWoody, Kim Primary Care Physician: [name], [name] Other Clinician: Izetta DakinWallace, Sallie Referring Physician: [name], [name] Treating Physician/Extender: BURNS III, Regis BillWALTER Weeks in Treatment: 6 Vital Signs Time Taken: 09:25 Temperature (F): 97.8 Height (in): 70 Pulse (bpm): 88 Weight (lbs): 200 Respiratory Rate (breaths/min): 18 Body Mass Index (BMI): 28.7 Blood Pressure (mmHg): 110/52 Capillary Blood Glucose (mg/dl): 536215 Reference Range: 80 - 120 mg / dl Electronic Signature(s) Signed: 06/15/2014 9:51:46 AM By: Elliot GurneyWoody, RN, BSN, Kim RN, BSN Entered By: Elliot GurneyWoody, RN, BSN, Kim on 06/15/2014 09:51:46

## 2014-06-16 ENCOUNTER — Encounter: Payer: Medicare Other | Admitting: Surgery

## 2014-06-16 ENCOUNTER — Ambulatory Visit: Payer: PRIVATE HEALTH INSURANCE | Admitting: Surgery

## 2014-06-16 ENCOUNTER — Ambulatory Visit (INDEPENDENT_AMBULATORY_CARE_PROVIDER_SITE_OTHER): Payer: Medicare Other | Admitting: *Deleted

## 2014-06-16 DIAGNOSIS — I495 Sick sinus syndrome: Secondary | ICD-10-CM | POA: Diagnosis not present

## 2014-06-16 DIAGNOSIS — L97323 Non-pressure chronic ulcer of left ankle with necrosis of muscle: Secondary | ICD-10-CM | POA: Diagnosis not present

## 2014-06-16 NOTE — Progress Notes (Signed)
Remote pacemaker transmission.   

## 2014-06-17 ENCOUNTER — Encounter: Payer: Medicare Other | Admitting: Surgery

## 2014-06-17 NOTE — Progress Notes (Signed)
[name], Roch L. ([id number]) Visit Report for 06/16/2014 Arrival Information Details Patient Name: [name], [name] Date of Service: 06/16/2014 8:45 AM Medical Record Number: [id number] Patient Account Number: [id number] Date of Birth/Sex: 05/25/1920 [age] y.o. Male) Treating RN: Clover MealyAfful, RN, BSN, [place] Sink[place] Primary Care Physician: [name], [name] Other Clinician: Referring Physician: [name], [name] Treating Physician/Extender: BURNS III, WALTER Weeks in Treatment: 7 Visit Information History Since Last Visit Any new allergies or adverse reactions: No Patient Arrived: Wheel Chair Had a fall or experienced change in No activities of daily living that may affect Arrival Time: 08:57 risk of falls: Accompanied By: son Signs or symptoms of abuse/neglect since last No Transfer Assistance: None visito Patient Identification Verified: Yes Hospitalized since last visit: No Secondary Verification Process Yes Has Dressing in Place as Prescribed: Yes Completed: Has Footwear/Offloading in Place as Yes Patient Requires Transmission-Based No Prescribed: Precautions: Left: Wedge Patient Has Alerts: No Shoe Pain Present Now: No Electronic Signature(s) Signed: 06/16/2014 11:43:39 AM By: Elpidio EricAfful, Rita BSN, RN Entered By: Elpidio EricAfful, Rita on 06/16/2014 08:57:37 Gear, [name] ([id number]) -------------------------------------------------------------------------------- Encounter Discharge Information Details Patient Name: [name], [name] Date of Service: 06/16/2014 8:45 AM Medical Record Number: [id number] Patient Account Number: [id number] Date of Birth/Sex: 05/25/1920 [age] y.o. Male) Treating RN: Clover MealyAfful, RN, BSN, [place] Sinkita Primary Care Physician: [name], [name] Other Clinician: Referring Physician: [name], [name] Treating Physician/Extender: BURNS III, Regis BillWALTER Weeks in Treatment: 7 Encounter Discharge Information Items Discharge Pain Level: 0 Discharge Condition: Stable Ambulatory Status:  Wheelchair Discharge Destination: Home Transportation: Private Auto Accompanied By: son Schedule Follow-up Appointment: No Medication Reconciliation completed and provided to Patient/Care No Alizzon Dioguardi: Provided on Clinical Summary of Care: 06/16/2014 Form Type Recipient Paper Patient AE Electronic Signature(s) Signed: 06/16/2014 11:43:39 AM By: Elpidio EricAfful, Rita BSN, RN Previous Signature: 06/16/2014 9:29:27 AM Version By: Gwenlyn PerkingMoore, Shelia Entered By: Elpidio EricAfful, Rita on 06/16/2014 09:34:10 Maston, [name] ([id number]) -------------------------------------------------------------------------------- Lower Extremity Assessment Details Patient Name: [name], [name] Date of Service: 06/16/2014 8:45 AM Medical Record Number: [id number] Patient Account Number: [id number] Date of Birth/Sex: 05/25/1920 [age] y.o. Male) Treating RN: Clover MealyAfful, RN, BSN, [place] Sinkita Primary Care Physician: [name], [name] Other Clinician: Referring Physician: [name], [name] Treating Physician/Extender: BURNS III, WALTER Weeks in Treatment: 7 Edema Assessment Assessed: [Left: No] [Right: No] E[Left: dema] [Right: :] Calf Left: Right: Point of Measurement: 34 cm From Medial Instep cm cm Ankle Left: Right: Point of Measurement: 12 cm From Medial Instep cm cm Vascular Assessment Pulses: Posterior Tibial Dorsalis Pedis Palpable: [Left:Yes] Extremity colors, hair growth, and conditions: Extremity Color: [Left:Normal] Hair Growth on Extremity: [Left:No] Temperature of Extremity: [Left:Warm] Capillary Refill: [Left:< 3 seconds] Dependent Rubor: [Left:No] Lipodermatosclerosis: [Left:No] Toe Nail Assessment Left: Right: Thick: Yes Discolored: Yes Deformed: Yes Improper Length and Hygiene: Yes Electronic Signature(s) Signed: 06/16/2014 11:43:39 AM By: Elpidio EricAfful, Rita BSN, RN Entered By: Elpidio EricAfful, Rita on 06/16/2014 09:01:34 Qian, [name] ([id number]) Covel, Graesyn LMarland Kitchen.  ([id number]) -------------------------------------------------------------------------------- Multi Wound Chart Details Patient Name: [name], [name] Date of Service: 06/16/2014 8:45 AM Medical Record Number: [id number] Patient Account Number: [id number] Date of Birth/Sex: 05/25/1920 [age] y.o. Male) Treating RN: Curtis Sitesorthy, Joanna Primary Care Physician: [name], [name] Other Clinician: Referring Physician: [name], [name] Treating Physician/Extender: BURNS III, WALTER Weeks in Treatment: 7 Vital Signs Height(in): 70 Pulse(bpm): 70 Weight(lbs): 200 Blood Pressure 94/33 (mmHg): Body Mass Index(BMI): 29 Temperature(F): 97.5 Respiratory Rate 17 (breaths/min): Photos: [1:No Photos] [2:No Photos] [3:No Photos] Wound Location: [1:Left Achilles] [2:Left Calcaneous - Lateral Left Toe Great] Wounding Event: [1:Gradually Appeared] [2:Gradually Appeared] [3:Gradually Appeared] Primary Etiology: [1: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] Comorbid History: [1:Cataracts, Chronic sinus Cataracts, Chronic sinus Cataracts, Chronic sinus problems/congestion, Congestive Heart Failure, Congestive Heart Failure, Congestive Heart Failure, Coronary Artery Disease, Coronary Artery Disease, Coronary  Artery Disease, Hypertension, Myocardial Hypertension, Myocardial Hypertension, Myocardial Infarction, Peripheral Arterial Disease, Type II Arterial Disease, Type II Arterial Disease, Type II Diabetes, Neuropathy] [2:problems/congestion, Infarction,  Peripheral Diabetes, Neuropathy] [3:problems/congestion, Infarction, Peripheral Diabetes, Neuropathy] Date Acquired: [1:01/24/2014] [2:01/24/2014] [3:01/24/2014] Weeks of Treatment: [1:7] [2:7] [3:7] Wound Status: [1:Open] [2:Open] [3:Open] Clustered Wound: [1:No] [2:Yes] [3:No] Pending Amputation on Yes [2:No] [3:No] Presentation: Measurements L x W x D 7.3x5x0.7 [2:1.1x3.7x0.4] [3:1x1x0.1] (cm) Area (cm) : [1:28.667]  [2:3.197] [3:0.785] Volume (cm) : [1:20.067] [2:1.279] [3:0.079] % Reduction in Area: [1:-15.90%] [2:43.50%] [3:9.10%] % Reduction in Volume: -170.40% [2:-126.40%] [3:8.10%] Classification: [1:Full Thickness With Exposed Support Structures] [2:Full Thickness Without Exposed Support Structures] [3:Unclassifiable] HBO Classification: [1:Grade 3] [2:Grade 1] [3:Grade 0] Wagner Verification: [1:Abscess] [2:N/A] [3:N/A] Exudate Amount: [1:Medium] [2:Small] [3:None Present] Exudate Type: Serous Serous N/A Exudate Color: amber amber N/A Wound Margin: Epibole Distinct, outline attached Distinct, outline attached Granulation Amount: Small (1-33%) Small (1-33%) None Present (0%) Granulation Quality: Pink Pink N/A Necrotic Amount: Large (67-100%) Medium (34-66%) Large (67-100%) Necrotic Tissue: Eschar, Adherent Slough Adherent Slough Eschar Exposed Structures: Tendon: Yes Fascia: No Fascia: No Fascia: No Fat: No Fat: No Fat: No Tendon: No Tendon: No Muscle: No Muscle: No Muscle: No Joint: No Joint: No Joint: No Bone: No Bone: No Bone: No Limited to Skin Limited to Skin Breakdown Breakdown Epithelialization: None None None Periwound Skin Texture: Edema: No Edema: No Edema: No Excoriation: No Excoriation: No Excoriation: No Induration: No Induration: No Induration: No Callus: No Callus: No Callus: No Crepitus: No Crepitus: No Crepitus: No Fluctuance: No Fluctuance: No Fluctuance: No Friable: No Friable: No Friable: No Rash: No Rash: No Rash: No Scarring: No Scarring: No Scarring: No Periwound Skin Moist: Yes Moist: Yes Dry/Scaly: Yes Moisture: Maceration: No Maceration: No Maceration: No Dry/Scaly: No Dry/Scaly: No Moist: No Periwound Skin Color: Erythema: Yes Atrophie Blanche: No Atrophie Blanche: No Rubor: Yes Cyanosis: No Cyanosis: No Atrophie Blanche: No Ecchymosis: No Ecchymosis: No Cyanosis: No Erythema: No Erythema: No Ecchymosis:  No Hemosiderin Staining: No Hemosiderin Staining: No Hemosiderin Staining: No Mottled: No Mottled: No Mottled: No Pallor: No Pallor: No Pallor: No Rubor: No Rubor: No Erythema Location: Circumferential N/A N/A Erythema Change: No Change N/A N/A Temperature: No Abnormality No Abnormality No Abnormality Tenderness on No Yes No Palpation: Wound Preparation: Ulcer Cleansing: Ulcer Cleansing: Ulcer Cleansing: Rinsed/Irrigated with Rinsed/Irrigated with Rinsed/Irrigated with Saline Saline Saline Topical Anesthetic Topical Anesthetic Topical Anesthetic Applied: Other: lidocaine Applied: Other: lidocaine Applied: Other: 4% 4% LIDOCAINE 4% Treatment Notes ROHN, FRITSCH ([id number]) Electronic Signature(s) Signed: 06/16/2014 5:04:08 PM By: Curtis Sites Entered By: Curtis Sites on 06/16/2014 09:15:46 Winzer, [name] ([id number]) -------------------------------------------------------------------------------- Multi-Disciplinary Care Plan Details Patient Name: [name], [name] Date of Service: 06/16/2014 8:45 AM Medical Record Number: [id number] Patient Account Number: [id number] Date of Birth/Sex: Jan 11, 1921 (81 y.o. Male) Treating RN: Curtis Sites Primary Care Physician: Lindwood Qua Other Clinician: Referring Physician: Lindwood Qua Treating Physician/Extender: BURNS III, WALTER Weeks in Treatment: 7 Active Inactive Abuse / Safety / Falls / Self Care Management Nursing Diagnoses: Potential for falls Goals: Patient will remain injury free Date Initiated: 04/28/2014 Goal Status: Active Interventions: Assess fall risk on admission and as needed Notes: Necrotic Tissue Nursing Diagnoses: Impaired tissue integrity related to necrotic/devitalized tissue Goals: Necrotic/devitalized tissue will be minimized  in the wound bed Date Initiated: 04/28/2014 Goal Status: Active Interventions: Provide education on necrotic tissue and debridement process Treatment  Activities: Apply topical anesthetic as ordered : 06/16/2014 Notes: Nutrition Nursing Diagnoses: Potential for alteratiion in Nutrition/Potential for imbalanced nutrition Vanderploeg, Torion L. ([id number]) Goals: Patient/caregiver agrees to and verbalizes understanding of need to use nutritional supplements and/or vitamins as prescribed Date Initiated: 04/28/2014 Goal Status: Active Interventions: Assess patient nutrition upon admission and as needed per policy Notes: Orientation to the Wound Care Program Nursing Diagnoses: Knowledge deficit related to the wound healing center program Goals: Patient/caregiver will verbalize understanding of the Wound Healing Center Program Date Initiated: 04/28/2014 Goal Status: Active Interventions: Provide education on orientation to the wound center Notes: Wound/Skin Impairment Nursing Diagnoses: Impaired tissue integrity Goals: Ulcer/skin breakdown will have a volume reduction of 30% by week 4 Date Initiated: 04/28/2014 Goal Status: Active Interventions: Assess ulceration(s) every visit Notes: Electronic Signature(s) Signed: 06/16/2014 5:04:08 PM By: Curtis Sites Entered By: Curtis Sites on 06/16/2014 09:15:31 [name], [name] ([id number]) -------------------------------------------------------------------------------- Pain Assessment Details Patient Name: [name], [name] Date of Service: 06/16/2014 8:45 AM Medical Record Number: [id number] Patient Account Number: [id number] Date of Birth/Sex: Jul 15, 1920 (81 y.o. Male) Treating RN: Clover Mealy, RN, BSN, [place] Sink Primary Care Physician: Lindwood Qua Other Clinician: Referring Physician: Lindwood Qua Treating Physician/Extender: BURNS III, WALTER Weeks in Treatment: 7 Active Problems Location of Pain Severity and Description of Pain Patient Has Paino No Site Locations Pain Management and Medication Current Pain Management: Electronic Signature(s) Signed: 06/16/2014 11:43:39 AM By: Elpidio Eric  BSN, RN Entered By: Elpidio Eric on 06/16/2014 08:57:45 [name], [name] ([id number]) -------------------------------------------------------------------------------- Patient/Caregiver Education Details Patient Name: [name], [name] Date of Service: 06/16/2014 8:45 AM Medical Record Number: [id number] Patient Account Number: [id number] Date of Birth/Gender: 11-09-1920 (81 y.o. Male) Treating RN: Curtis Sites Primary Care Physician: Lindwood Qua Other Clinician: Referring Physician: Lindwood Qua Treating Physician/Extender: BURNS III, Regis Bill in Treatment: 7 Education Assessment Education Provided To: Patient and Caregiver Education Topics Provided Hyperbaric Oxygenation: Handouts: Other: continue HBO next week Methods: Explain/Verbal Responses: State content correctly Electronic Signature(s) Signed: 06/16/2014 5:04:08 PM By: Curtis Sites Entered By: Curtis Sites on 06/16/2014 09:23:53 [name], [name] ([id number]) -------------------------------------------------------------------------------- Wound Assessment Details Patient Name: [name], [name] Date of Service: 06/16/2014 8:45 AM Medical Record Number: [id number] Patient Account Number: [id number] Date of Birth/Sex: 1920/11/11 (81 y.o. Male) Treating RN: Clover Mealy, RN, BSN, Rita Primary Care Physician: Lindwood Qua Other Clinician: Referring Physician: Lindwood Qua Treating Physician/Extender: BURNS III, WALTER Weeks in Treatment: 7 Wound Status Wound Number: 1 Primary Arterial Insufficiency Ulcer Etiology: Wound Location: Left Achilles Wound Open Wounding Event: Gradually Appeared Status: Date Acquired: 01/24/2014 Comorbid Cataracts, Chronic sinus Weeks Of Treatment: 7 History: problems/congestion, Congestive Heart Clustered Wound: No Failure, Coronary Artery Disease, Pending Amputation On Presentation Hypertension, Myocardial Infarction, Peripheral Arterial Disease, Type II Diabetes,  Neuropathy Photos Photo Uploaded By: Elpidio Eric on 06/16/2014 11:29:12 Wound Measurements Length: (cm) 7.3 Width: (cm) 5 Depth: (cm) 0.7 Area: (cm) 28.667 Volume: (cm) 20.067 % Reduction in Area: -15.9% % Reduction in Volume: -170.4% Epithelialization: None Undermining: No Wound Description Full Thickness With Exposed Foul Odor Af Classification: Support Structures Diabetic Severity Grade 3 (Wagner): Wagner Verification: Abscess Wound Margin: Epibole Exudate Amount: Medium Exudate Type: Serous Bolon, Riggins L. ([id number]) ter Cleansing: No Exudate Color: amber Wound Bed Granulation Amount: Small (1-33%) Exposed Structure Granulation Quality: Pink Fascia Exposed: No Necrotic Amount: Large (67-100%) Fat Layer Exposed: No Necrotic Quality: Eschar, Adherent Slough Tendon Exposed: Yes  Muscle Exposed: No Joint Exposed: No Bone Exposed: No Periwound Skin Texture Texture Color No Abnormalities Noted: No No Abnormalities Noted: No Callus: No Atrophie Blanche: No Crepitus: No Cyanosis: No Excoriation: No Ecchymosis: No Fluctuance: No Erythema: Yes Friable: No Erythema Location: Circumferential Induration: No Erythema Change: No Change Localized Edema: No Hemosiderin Staining: No Rash: No Mottled: No Scarring: No Pallor: No Rubor: Yes Moisture No Abnormalities Noted: No Temperature / Pain Dry / Scaly: No Temperature: No Abnormality Maceration: No Moist: Yes Wound Preparation Ulcer Cleansing: Rinsed/Irrigated with Saline Topical Anesthetic Applied: Other: lidocaine 4%, Treatment Notes Wound #1 (Left Achilles) 1. Cleansed with: Clean wound with Normal Saline 4. Dressing Applied: Santyl Ointment 5. Secondary Dressing Applied Gauze and Kerlix/Conform 7. Secured with Tape Notes stretch netting LUCCAS, TOWELL ([id number]) Electronic Signature(s) Signed: 06/16/2014 11:43:39 AM By: Elpidio Eric BSN, RN Entered By: Elpidio Eric on 06/16/2014  09:07:28 Matkins, [name] ([id number]) -------------------------------------------------------------------------------- Wound Assessment Details Patient Name: [name], [name] Date of Service: 06/16/2014 8:45 AM Medical Record Number: [id number] Patient Account Number: [id number] Date of Birth/Sex: January 11, 1921 (81 y.o. Male) Treating RN: Clover Mealy, RN, BSN, Rita Primary Care Physician: Lindwood Qua Other Clinician: Referring Physician: Lindwood Qua Treating Physician/Extender: BURNS III, WALTER Weeks in Treatment: 7 Wound Status Wound Number: 2 Primary Arterial Insufficiency Ulcer Etiology: Wound Location: Left Calcaneous - Lateral Wound Open Wounding Event: Gradually Appeared Status: Date Acquired: 01/24/2014 Comorbid Cataracts, Chronic sinus Weeks Of Treatment: 7 History: problems/congestion, Congestive Heart Clustered Wound: Yes Failure, Coronary Artery Disease, Hypertension, Myocardial Infarction, Peripheral Arterial Disease, Type II Diabetes, Neuropathy Photos Photo Uploaded By: Elpidio Eric on 06/16/2014 11:29:12 Wound Measurements Length: (cm) 1.1 % Reduction i Width: (cm) 3.7 % Reduction i Depth: (cm) 0.4 Epithelializa Area: (cm) 3.197 Tunneling: Volume: (cm) 1.279 n Area: 43.5% n Volume: -126.4% tion: None No Wound Description Full Thickness Without Foul Odor Aft Classification: Exposed Support Structures Diabetic Severity Grade 1 (Wagner): Wound Margin: Distinct, outline attached Exudate Amount: Small Exudate Type: Serous Exudate Color: amber Wooley, Nate L. ([id number]) er Cleansing: No Wound Bed Granulation Amount: Small (1-33%) Exposed Structure Granulation Quality: Pink Fascia Exposed: No Necrotic Amount: Medium (34-66%) Fat Layer Exposed: No Necrotic Quality: Adherent Slough Tendon Exposed: No Muscle Exposed: No Joint Exposed: No Bone Exposed: No Limited to Skin Breakdown Periwound Skin Texture Texture Color No Abnormalities Noted:  No No Abnormalities Noted: No Callus: No Atrophie Blanche: No Crepitus: No Cyanosis: No Excoriation: No Ecchymosis: No Fluctuance: No Erythema: No Friable: No Hemosiderin Staining: No Induration: No Mottled: No Localized Edema: No Pallor: No Rash: No Rubor: No Scarring: No Temperature / Pain Moisture Temperature: No Abnormality No Abnormalities Noted: No Tenderness on Palpation: Yes Dry / Scaly: No Maceration: No Moist: Yes Wound Preparation Ulcer Cleansing: Rinsed/Irrigated with Saline Topical Anesthetic Applied: Other: lidocaine 4%, Treatment Notes Wound #2 (Left, Lateral Calcaneous) 1. Cleansed with: Clean wound with Normal Saline 4. Dressing Applied: Aquacel Ag 5. Secondary Dressing Applied Gauze and Kerlix/Conform Electronic Signature(s) Signed: 06/16/2014 11:43:39 AM By: Elpidio Eric BSN, RN Entered By: Elpidio Eric on 06/16/2014 09:08:16 Dor, [name] ([id number]) Schreck, Samvel [name] ([id number]) -------------------------------------------------------------------------------- Wound Assessment Details Patient Name: [name], Jathniel L. Date of Service: 06/16/2014 8:45 AM Medical Record Number: [id number] Patient Account Number: [id number] Date of Birth/Sex: 1920/10/26 (81 y.o. Male) Treating RN: Clover Mealy, RN, BSN, [place] Sink Primary Care Physician: Lindwood Qua Other Clinician: Referring Physician: Lindwood Qua Treating Physician/Extender: BURNS III, WALTER Weeks in Treatment: 7 Wound Status Wound Number: 3 Primary Arterial Insufficiency Ulcer Etiology: Wound Location:  Left Toe Great Wound Open Wounding Event: Gradually Appeared Status: Date Acquired: 01/24/2014 Comorbid Cataracts, Chronic sinus Weeks Of Treatment: 7 History: problems/congestion, Congestive Heart Clustered Wound: No Failure, Coronary Artery Disease, Hypertension, Myocardial Infarction, Peripheral Arterial Disease, Type II Diabetes, Neuropathy Photos Photo Uploaded By: Elpidio Eric on  06/16/2014 11:29:13 Wound Measurements Length: (cm) 1 Width: (cm) 1 Depth: (cm) 0.1 Area: (cm) 0.785 Volume: (cm) 0.079 % Reduction in Area: 9.1% % Reduction in Volume: 8.1% Epithelialization: None Tunneling: No Undermining: No Wound Description Classification: Unclassifiable Foul O Diabetic Severity (Wagner): Grade 0 Wound Margin: Distinct, outline attached Exudate Amount: None Present dor After Cleansing: No Wound Bed Granulation Amount: None Present (0%) Exposed Structure Necrotic Amount: Large (67-100%) Fascia Exposed: No Noyes, Lymon L. ([id number]) Necrotic Quality: Eschar Fat Layer Exposed: No Tendon Exposed: No Muscle Exposed: No Joint Exposed: No Bone Exposed: No Limited to Skin Breakdown Periwound Skin Texture Texture Color No Abnormalities Noted: No No Abnormalities Noted: No Callus: No Atrophie Blanche: No Crepitus: No Cyanosis: No Excoriation: No Ecchymosis: No Fluctuance: No Erythema: No Friable: No Hemosiderin Staining: No Induration: No Mottled: No Localized Edema: No Pallor: No Rash: No Rubor: No Scarring: No Temperature / Pain Moisture Temperature: No Abnormality No Abnormalities Noted: No Dry / Scaly: Yes Maceration: No Moist: No Wound Preparation Ulcer Cleansing: Rinsed/Irrigated with Saline Topical Anesthetic Applied: Other: LIDOCAINE 4%, Treatment Notes Wound #3 (Left Toe Great) 1. Cleansed with: Clean wound with Normal Saline 4. Dressing Applied: Aquacel Ag 5. Secondary Dressing Applied Gauze and Kerlix/Conform Electronic Signature(s) Signed: 06/16/2014 11:43:39 AM By: Elpidio Eric BSN, RN Entered By: Elpidio Eric on 06/16/2014 09:08:40 Teutsch, [name] ([id number]) -------------------------------------------------------------------------------- Vitals Details Patient Name: Bywater, Crandall L. Date of Service: 06/16/2014 8:45 AM Medical Record Number: [id number] Patient Account Number: [id number] Date of Birth/Sex:  02/22/1920 (81 y.o. Male) Treating RN: Clover Mealy, RN, BSN, Rita Primary Care Physician: Lindwood Qua Other Clinician: Referring Physician: Lindwood Qua Treating Physician/Extender: BURNS III, WALTER Weeks in Treatment: 7 Vital Signs Time Taken: 09:05 Temperature (F): 97.5 Height (in): 70 Pulse (bpm): 70 Weight (lbs): 200 Respiratory Rate (breaths/min): 17 Body Mass Index (BMI): 28.7 Blood Pressure (mmHg): 94/33 Reference Range: 80 - 120 mg / dl Pulse Oximetry (%): 98 Electronic Signature(s) Signed: 06/16/2014 11:43:39 AM By: Elpidio Eric BSN, RN Entered By: Elpidio Eric on 06/16/2014 09:09:15

## 2014-06-17 NOTE — Progress Notes (Signed)
[name], [name] ([id number]) Visit Report for 06/16/2014 Chief Complaint Document Details Patient Name: [name], [name] Date of Service: 06/16/2014 8:45 AM Medical Record Number: [id number] Patient Account Number: [id number] Date of Birth/Sex: Nov 06, 1920 (81 y.o. Male) Treating RN: Primary Care Physician: Lindwood Qua Other Clinician: Referring Physician: Lindwood Qua Treating Physician/Extender: BURNS III, Milisa Kimbell Weeks in Treatment: 7 Information Obtained from: Patient Chief Complaint Patient presents to the wound care center for a consult due non healing wound 81 year old gentleman who comes with a history of having some ulcerated areas on his heels since February 2016 and then a large injury to his left posterior heel and ankle since about a month. Electronic Signature(s) Signed: 06/16/2014 4:25:37 PM By: Madelaine Bhat MD Entered By: Madelaine Bhat on 06/16/2014 10:04:20 [name], [name] ([id number]) -------------------------------------------------------------------------------- Debridement Details Patient Name: [name], [name] Date of Service: 06/16/2014 8:45 AM Medical Record Number: [id number] Patient Account Number: [id number] Date of Birth/Sex: [birth year] (81 y.o. Male) Treating RN: Primary Care Physician: Lindwood Qua Other Clinician: Referring Physician: Lindwood Qua Treating Physician/Extender: BURNS III, Teia Freitas Weeks in Treatment: 7 Debridement Performed for Wound #1 Left Achilles Assessment: Performed By: Physician BURNS III, Jerrett Baldinger, Debridement: Open Wound/Selective Debridement Selective Description: Pre-procedure Yes Verification/Time Out Taken: Start Time: 09:14 Pain Control: Lidocaine 4% Topical Solution Level: Non-Viable Tissue Total Area Debrided (L x 7.3 (cm) x 5 (cm) = 36.5 (cm) W): Tissue and other Non-Viable, Fibrin/Slough material debrided: Instrument: Curette Bleeding: None End Time: 09:17 Procedural Pain: 0 Post Procedural  Pain: 0 Response to Treatment: Procedure was tolerated well Post Debridement Measurements of Total Wound Length: (cm) 7.3 Width: (cm) 5 Depth: (cm) 0.8 Volume: (cm) 22.934 Electronic Signature(s) Signed: 06/16/2014 4:25:37 PM By: Madelaine Bhat MD Entered By: Madelaine Bhat on 06/16/2014 10:38:49 [name], [name] ([id number]) -------------------------------------------------------------------------------- Debridement Details Patient Name: [name], [name] Date of Service: 06/16/2014 8:45 AM Medical Record Number: [id number] Patient Account Number: [id number] Date of Birth/Sex: 1920/11/01 (81 y.o. Male) Treating RN: Primary Care Physician: Lindwood Qua Other Clinician: Referring Physician: Lindwood Qua Treating Physician/Extender: BURNS III, Tamora Huneke Weeks in Treatment: 7 Debridement Performed for Wound #2 Left,Lateral Calcaneous Assessment: Performed By: Physician BURNS III, Maddyn Lieurance, Debridement: Open Wound/Selective Debridement Selective Description: Pre-procedure Yes Verification/Time Out Taken: Start Time: 09:14 Pain Control: Lidocaine 4% Topical Solution Level: Non-Viable Tissue Total Area Debrided (L x 1.1 (cm) x 3.7 (cm) = 4.07 (cm) W): Tissue and other Non-Viable, Fibrin/Slough material debrided: Instrument: Curette Bleeding: None End Time: 09:17 Procedural Pain: 0 Post Procedural Pain: 0 Response to Treatment: Procedure was tolerated well Post Debridement Measurements of Total Wound Length: (cm) 1.1 Width: (cm) 3.7 Depth: (cm) 0.5 Volume: (cm) 1.598 Electronic Signature(s) Signed: 06/16/2014 4:25:37 PM By: Madelaine Bhat MD Entered By: Madelaine Bhat on 06/16/2014 10:39:01 [name], [name] ([id number]) -------------------------------------------------------------------------------- Debridement Details Patient Name: [name], [name] Date of Service: 06/16/2014 8:45 AM Medical Record Number: [id number] Patient Account Number:  [id number] Date of Birth/Sex: July 01, 1920 (81 y.o. Male) Treating RN: Primary Care Physician: Lindwood Qua Other Clinician: Referring Physician: Lindwood Qua Treating Physician/Extender: BURNS III, Jo-Ann Johanning Weeks in Treatment: 7 Debridement Performed for Wound #3 Left Toe Great Assessment: Performed By: Physician BURNS III, Bernise Sylvain, Debridement: Open Wound/Selective Debridement Selective Description: Pre-procedure Yes Verification/Time Out Taken: Start Time: 09:17 Pain Control: Lidocaine 4% Topical Solution Level: Non-Viable Tissue Total Area Debrided (L x 1 (cm) x 1 (cm) = 1 (cm) W): Tissue and other Non-Viable, Fibrin/Slough material debrided: Instrument: Curette Bleeding: None End Time: 09:19 Procedural Pain:  0 Post Procedural Pain: 0 Response to Treatment: Procedure was tolerated well Post Debridement Measurements of Total Wound Length: (cm) 1 Width: (cm) 1 Depth: (cm) 0.2 Volume: (cm) 0.157 Electronic Signature(s) Signed: 06/16/2014 4:25:37 PM By: Madelaine Bhat MD Entered By: Madelaine Bhat on 06/16/2014 10:39:17 [name], [name] ([id number]) -------------------------------------------------------------------------------- HPI Details Patient Name: [name], [name] Date of Service: 06/16/2014 8:45 AM Medical Record Number: [id number] Patient Account Number: [id number] Date of Birth/Sex: [birth year] (81 y.o. Male) Treating RN: Primary Care Physician: Lindwood Qua Other Clinician: Referring Physician: Lindwood Qua Treating Physician/Extender: BURNS III, Keyaira Clapham Weeks in Treatment: 7 History of Present Illness HPI Description: 81 year old gentleman who was known to be a diabetic for many years has peripheral neuropathy recently went to his primary care doctor for a punctured wound on his left heel which was something he had noticed. The patient then was referred to a podiatrist who referred him to Dr. Wyn Quaker in the vascular surgery department and I  understand the procedure was done on 03/14/2014 with a left lower extremity vascular procedure and stenting was done. Details of this are not available at the present time. The patient has been applying Neosporin to his leg and has not had any wound care addressed so far. patient has also had a history of coronary artery disease in the past and hasn't had a CABG and a pacemaker placement in the remote past.Other details and notes are pending. the patient is not in pain lives alone and has some home health and other aides coming to help him with his daily chores and meals. reviewing the vascular notes I understand the procedure was done on 03/14/2014 and he was operated by Dr. Wyn Quaker. he had a catheter placement to the left peroneal artery and a aortogram and selective left lower extremity angiogram was done. He also had a percutaneous transluminal angioplasty of the left peroneal artery and the tibioperoneal trunk. The distal SFA and above-knee popliteal artery were also angioplastied. A subcutaneous stent placement to the distal superficial femoral artery was also done. 05/05/2014 -- the patient has not had any change in his health and after much consideration is decided that he does not want HBOT as he is claustrophobic and was unable to tolerate being in the chamber for 90 minutes. I have discussed with him that his most recent x-ray of the foot shows that he has the distal phalanx of the left great toe showing changes with osteomyelitis cannot be excluded at that site. He tells me that he cannot have an MRI because of his defibrillator and hence we will order a triple phase bone scan. I have also reviewed his culture report which shows several organisms and he has sensitivity to tetracycline and we have recommended he takes this for 14 days and this has been given to him on 05/02/2014 05/12/2014 the bone scan done on 05/10/2014 shows #1 findings are worrisome for osteomyelitis involving the  calcaneus of the left foot, #2 increase uptake localizing to the left second and third toe on all 3 phases. Cannot rule out osteomyelitis in this area. And #3 left foot cellulitis. 05/19/2014 -- reviewed several reports which we have received back on [name]. #1 chest x-ray done on April 21 shows chronic changes in the left base no acute findings. #2 his CBC is within normal limits. #3 hemoglobin A1c was 8.5%. #4 EKG done on 26 April was within normal limits due to his electronic ventricular pacemaker. 05/26/2014 -- he has had his PICC line  placed and is taking vancomycin daily basis. He is to start hyperbaric oxygen therapy on this coming Monday. 06/09/2014 -- he saw Dr. Sampson Goon yesterday and besides his IV antibiotic I believe a oral antibiotic was MIKEAL, WINSTANLEY. ([id number]) also given and this may be Cipro. Patient is feeling fine otherwise does not have any symptoms though his blood pressure this morning in the wound center has been 90/50. We will check his blood pressure in the supine position. 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 Complained of worsening fatigue yesterday after HBO. Feels better today. Has appointment with PCP this afternoon. He wishes to hold off on HBO until next week. No significant pain. No fever or chills. Stable drainage. Electronic Signature(s) Signed: 06/16/2014 4:25:37 PM By: Madelaine Bhat MD Entered By: Madelaine Bhat on 06/16/2014 10:10:44 Wiginton, [name] ([id number]) -------------------------------------------------------------------------------- Physical Exam Details Patient Name: [name], [name] Date of Service: 06/16/2014 8:45 AM Medical Record Number: [id number] Patient Account Number: [id number] Date of Birth/Sex: March 08, 1920 (81 y.o. Male) Treating RN: Primary Care Physician: Lindwood Qua Other Clinician: Referring Physician: Lindwood Qua Treating Physician/Extender: BURNS III,  Leiliana Foody Weeks in Treatment: 7 Constitutional . Pulse regular. Respirations normal and unlabored. Afebrile. . Cardiovascular . Integumentary (Hair, Skin) .Marland Kitchen Neurological . Psychiatric Judgement and insight Intact.. Oriented times 3.. No evidence of depression, anxiety, or agitation.. Notes Left heel ulcer x2, Achilles ulcer, and great toe ulcer. All full thickness. No probe to bone, but close. No significant cellulitis. No palpable pedal pulses per his baseline. 1+ pitting edema. No significant change in size but all appear cleaner with increased granulation tissue. Electronic Signature(s) Signed: 06/16/2014 4:25:37 PM By: Madelaine Bhat MD Entered By: Madelaine Bhat on 06/16/2014 10:32:54 [place], [name] ([id number]) -------------------------------------------------------------------------------- Physician Orders Details Patient Name: [name], [name] Date of Service: 06/16/2014 8:45 AM Medical Record Number: [id number] Patient Account Number: [id number] Date of Birth/Sex: [birth year] (81 y.o. Male) Treating RN: Curtis Sites Primary Care Physician: Lindwood Qua Other Clinician: Referring Physician: Lindwood Qua Treating Physician/Extender: BURNS III, Regis Bill in Treatment: 7 Verbal / Phone Orders: Yes Clinician: Curtis Sites Read Back and Verified: Yes Diagnosis Coding Wound Cleansing Wound #1 Left Achilles o Clean wound with Normal Saline. o May shower with protection. Wound #2 Left,Lateral Calcaneous o Clean wound with Normal Saline. o May shower with protection. Wound #3 Left Toe Great o Clean wound with Normal Saline. o May shower with protection. Anesthetic Wound #1 Left Achilles o Topical Lidocaine 4% cream applied to wound bed prior to debridement Wound #2 Left,Lateral Calcaneous o Topical Lidocaine 4% cream applied to wound bed prior to debridement Wound #3 Left Toe Great o Topical Lidocaine 4% cream applied to wound bed  prior to debridement Skin Barriers/Peri-Wound Care Wound #1 Left Achilles o Skin Prep Wound #2 Left,Lateral Calcaneous o Skin Prep Wound #3 Left Toe Great o Skin Prep Primary Wound Dressing Wound #1 Left Achilles o Santyl Ointment [name], [name] ([id number]) Wound #2 Left,Lateral Calcaneous o Aquacel Ag Wound #3 Left Toe Great o Aquacel Ag Secondary Dressing Wound #1 Left Achilles o Gauze, ABD and Kerlix/Conform Wound #2 Left,Lateral Calcaneous o Gauze, ABD and Kerlix/Conform Wound #3 Left Toe Great o Gauze, ABD and Kerlix/Conform Dressing Change Frequency Wound #1 Left Achilles o Change dressing every day. Wound #2 Left,Lateral Calcaneous o Change dressing every day. Wound #3 Left Toe Great o Change dressing every day. Follow-up Appointments Wound #1 Left Achilles o Return Appointment in 1  week. Wound #2 Left,Lateral Calcaneous o Return Appointment in 1 week. Wound #3 Left Toe Great o Return Appointment in 1 week. Additional Orders / Instructions Wound #1 Left Achilles o Increase protein intake. o Other: - goal is to keep blood sugars under 180 Wound #2 Left,Lateral Calcaneous o Increase protein intake. o Other: - goal is to keep blood sugars under 180 Wound #3 Left Toe Great o Increase protein intake. [name], CHEESE ([id number]) o Other: - goal is to keep blood sugars under 180 Home Health Wound #1 Left Achilles o Continue Home Health Visits - [facility] o Home Health Nurse may visit PRN to address patientos wound care needs. o FACE TO FACE ENCOUNTER: MEDICARE and MEDICAID PATIENTS: I certify that this patient is under my care and that I had a face-to-face encounter that meets the physician face-to-face encounter requirements with this patient on this date. The encounter with the patient was in whole or in part for the following MEDICAL CONDITION: (primary reason for Home Healthcare) MEDICAL  NECESSITY: I certify, that based on my findings, NURSING services are a medically necessary home health service. HOME BOUND STATUS: I certify that my clinical findings support that this patient is homebound (i.e., Due to illness or injury, pt requires aid of supportive devices such as crutches, cane, wheelchairs, walkers, the use of special transportation or the assistance of another person to leave their place of residence. There is a normal inability to leave the home and doing so requires considerable and taxing effort. Other absences are for medical reasons / religious services and are infrequent or of short duration when for other reasons). o If current dressing causes regression in wound condition, may D/C ordered dressing product/s and apply Normal Saline Moist Dressing daily until next Wound Healing Center / Other MD appointment. Notify Wound Healing Center of regression in wound condition at [phone]. o Please direct any NON-WOUND related issues/requests for orders to patient's Primary Care Physician Wound #2 Left,Lateral Calcaneous o Continue Home Health Visits - [facility] o Home Health Nurse may visit PRN to address patientos wound care needs. o FACE TO FACE ENCOUNTER: MEDICARE and MEDICAID PATIENTS: I certify that this patient is under my care and that I had a face-to-face encounter that meets the physician face-to-face encounter requirements with this patient on this date. The encounter with the patient was in whole or in part for the following MEDICAL CONDITION: (primary reason for Home Healthcare) MEDICAL NECESSITY: I certify, that based on my findings, NURSING services are a medically necessary home health service. HOME BOUND STATUS: I certify that my clinical findings support that this patient is homebound (i.e., Due to illness or injury, pt requires aid of supportive devices such as crutches, cane, wheelchairs, walkers, the use of  special transportation or the assistance of another person to leave their place of residence. There is a normal inability to leave the home and doing so requires considerable and taxing effort. Other absences are for medical reasons / religious services and are infrequent or of short duration when for other reasons). o If current dressing causes regression in wound condition, may D/C ordered dressing product/s and apply Normal Saline Moist Dressing daily until next Wound Healing Center / Other MD appointment. Notify Wound Healing Center of regression in wound condition at [phone]. o Please direct any NON-WOUND related issues/requests for orders to patient's Primary Care Physician Wound #3 Left Toe Doretha Sou Continue Home Health Visits - [facility] o Home Health Nurse  may visit PRN to address patientos wound care needs. DERK, DOUBEK ([id number]) o FACE TO FACE ENCOUNTER: MEDICARE and MEDICAID PATIENTS: I certify that this patient is under my care and that I had a face-to-face encounter that meets the physician face-to-face encounter requirements with this patient on this date. The encounter with the patient was in whole or in part for the following MEDICAL CONDITION: (primary reason for Home Healthcare) MEDICAL NECESSITY: I certify, that based on my findings, NURSING services are a medically necessary home health service. HOME BOUND STATUS: I certify that my clinical findings support that this patient is homebound (i.e., Due to illness or injury, pt requires aid of supportive devices such as crutches, cane, wheelchairs, walkers, the use of special transportation or the assistance of another person to leave their place of residence. There is a normal inability to leave the home and doing so requires considerable and taxing effort. Other absences are for medical reasons / religious services and are infrequent or of short duration when for other reasons). o If current  dressing causes regression in wound condition, may D/C ordered dressing product/s and apply Normal Saline Moist Dressing daily until next Wound Healing Center / Other MD appointment. Notify Wound Healing Center of regression in wound condition at [phone]. o Please direct any NON-WOUND related issues/requests for orders to patient's Primary Care Physician Hyperbaric Oxygen Therapy Wound #3 Left Toe Great o Indication: - osteomyelitis o If appropriate for treatment, begin HBOT per protocol: o 2.0 ATA for 90 Minutes without Air Breaks o One treatment per day (delivered Monday through Friday unless otherwise specified in Special Instructions below): o Total # of Treatments: - 40 o Finger stick Blood Glucose Pre- and Post- HBOT Treatment. o Follow Hyperbaric Oxygen Glycemia Protocol o Other - in chamber TCOM HBO Contraindications Wound #3 Left Toe Great - Left Lower Extremity o HBO contraindications of hyperbaric oxygen therapy were reviewed and the patient found to have no untreated pneumothorax or history of spontaneous pneumothorax. o HBO contraindications of hyperbaric oxygen therapy were reviewed and the patient found to have no history of medications such as Bleomycin, Adriamycin, disulfiram, cisplatin and sulfamylon and is not currently receiving any chemotherapy. o HBO contraindications of hyperbaric oxygen therapy were reviewed and the patient found to have no Upper respiratory infection and chronic sinusitis. o HBO contraindications of hyperbaric oxygen therapy were reviewed and the patient found to have no history of retinal surgery proceeding 6 weeks or intraocular gas o HBO contraindications of hyperbaric oxygen therapy were reviewed and the patient found to have no history seizure disorder or any anticonvulsant medication. o HBO contraindications of hyperbaric oxygen therapy were reviewed and the patient found to have no septicemia with CO2  retention. o HBO contraindications of hyperbaric oxygen therapy were reviewed and the patient found to have no fever greater than 100 degrees. o HBO contraindications of hyperbaric oxygen therapy were reviewed and the patient found to have no pregnancy noted. [name], [name] ([id number]) o HBO contraindications of hyperbaric oxygen therapy were reviewed and the patient found to have no medications such as steroids or narcotics or Phenergan. Medications-please add to medication list. Wound #1 Left Achilles - Left Lower Extremity o Santyl Enzymatic Ointment Wound #2 Left,Lateral Calcaneous - Left Lower Extremity o Santyl Enzymatic Ointment GLYCEMIA INTERVENTIONS PROTOCOL PRE-HBO GLYCEMIA INTERVENTIONS ACTION INTERVENTION Obtain pre-HBO capillary blood 1 glucose (ensure physician order is in chart). A. Notify HBO physician and await physician orders. 2 If result is 70 mg/dl or below:  B. If the result meets the hospital definition of a critical result, follow hospital policy. A. Give patient an 8 ounce Glucerna Shake, an 8 ounce Ensure, or 8 ounces of a Glucerna/Ensure equivalent dietary supplement*. B. Wait 30 minutes. If result is 71 mg/dl to 161 mg/dl: C. Retest patientos capillary blood glucose (CBG). D. If result greater than or equal to 110 mg/dl, proceed with HBO. If result less than 110 mg/dl, notify HBO physician and consider holding HBO. If result is 131 mg/dl to 096 mg/dl: A. Proceed with HBO. A. Notify HBO physician and await physician orders. B. It is recommended to hold HBO and do blood/urine ketone If result is 250 mg/dl or greater: testing. C. If the result meets the hospital definition of a critical result, follow hospital policy. POST-HBO GLYCEMIA INTERVENTIONS ACTION INTERVENTION Obtain post HBO capillary blood 1 glucose (ensure physician order is in chart). Kolodny, Nicolas L. ([id number]) 2 If result is 70 mg/dl or below: A. Notify HBO  physician and await physician orders. B. If the result meets the hospital definition of a critical result, follow hospital policy. A. Give patient an 8 ounce Glucerna Shake, an 8 ounce Ensure, or 8 ounces of a Glucerna/Ensure equivalent dietary supplement*. B. Wait 15 minutes for symptoms of hypoglycemia (i.e. nervousness, anxiety, If result is 71 mg/dl to 914 mg/dl: sweating, chills, clamminess, irritability, confusion, tachycardia or dizziness). C. If patient asymptomatic, discharge patient. If patient symptomatic, repeat capillary blood glucose (CBG) and notify HBO physician. If result is 101 mg/dl to 782 mg/dl: A. Discharge patient. A. Notify HBO physician and await physician orders. B. It is recommended to do If result is 250 mg/dl or greater: blood/urine ketone testing. C. If the result meets the hospital definition of a critical result, follow hospital policy. *Juice or candies are NOT equivalent products. If patient refuses the Glucerna or Ensure, please consult the hospital dietitian for an appropriate substitute. Electronic Signature(s) Signed: 06/16/2014 5:04:08 PM By: Curtis Sites Entered By: Curtis Sites on 06/16/2014 09:22:57 Innis, Mae Elbert Ewings ([id number]) -------------------------------------------------------------------------------- Problem List Details Patient Name: Christo, Hanna L. Date of Service: 06/16/2014 8:45 AM Medical Record Number: [id number] Patient Account Number: [id number] Date of Birth/Sex: 29-Oct-1920 (81 y.o. 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 Date of Service: 06/16/2014 8:45 AM Medical Record Number: [id number] Patient Account Number: [id number] Date of Birth/Sex: [birth year] (81 y.o. Male) Treating RN: Primary Care Physician: Lindwood Qua Other Clinician: Referring Physician: Lindwood Qua Treating Physician/Extender: BURNS III, Lundon Rosier Weeks in Treatment: 7 Subjective Chief Complaint Information obtained from Patient Patient presents to the wound care center for a consult due non healing wound 81 year old gentleman who comes with a history of having some ulcerated areas on his heels since February 2016 and then a large injury to his left posterior heel  and ankle since about a month. History of Present Illness (HPI) 81 year old gentleman who was known to be a diabetic for many years has peripheral neuropathy recently went to his primary care doctor for a punctured wound on his left heel which was something he had noticed. The patient then was referred to a podiatrist who referred him to Dr. Wyn Quaker in the vascular surgery department and I understand the procedure was done on 03/14/2014 with a left lower extremity vascular procedure and stenting was done. Details of this are not available at the present time. The patient has been applying Neosporin to his leg and has not had any wound care addressed so far. patient has also had a history of  coronary artery disease in the past and hasn't had a CABG and a pacemaker placement in the remote past.Other details and notes are pending. the patient is not in pain lives alone and has some home health and other aides coming to help him with his daily chores and meals. reviewing the vascular notes I understand the procedure was done on 03/14/2014 and he was operated by Dr. Wyn Quaker. he had a catheter placement to the left peroneal artery and a aortogram and selective left lower extremity angiogram was done. He also had a percutaneous transluminal angioplasty of the left peroneal artery and the tibioperoneal trunk. The distal SFA and above-knee popliteal artery were also angioplastied. A subcutaneous stent placement to the distal superficial femoral artery was also done. 05/05/2014 -- the patient has not had any change in his health and after much consideration is decided that he does not want HBOT as he is claustrophobic and was unable to tolerate being in the chamber for 90 minutes. I have discussed with him that his most recent x-ray of the foot shows that he has the distal phalanx of the left great toe showing changes with osteomyelitis cannot be excluded at that site. He tells me that he cannot have an MRI because of his defibrillator and hence we will order a triple phase bone scan. I have also reviewed his culture report which shows several organisms and he has sensitivity to tetracycline and we have recommended he takes this for 14 days and this has been given to him on 05/02/2014 05/12/2014 the bone scan done on 05/10/2014 shows #1 findings are worrisome for osteomyelitis involving the calcaneus of the left foot, #2 increase uptake localizing to the left second and third toe on all 3 phases. Cannot rule out osteomyelitis in this area. And #3 left foot cellulitis. 05/19/2014 -- reviewed several reports which we have received back on [name]. #1 chest x-ray done on April 21 shows chronic  changes in the left base no acute findings. Elbe, Teofil L. ([id number]) #2 his CBC is within normal limits. #3 hemoglobin A1c was 8.5%. #4 EKG done on 26 April was within normal limits due to his electronic ventricular pacemaker. 05/26/2014 -- he has had his PICC line placed and is taking vancomycin daily basis. He is to start hyperbaric oxygen therapy on this coming Monday. 06/09/2014 -- he saw Dr. Sampson Goon yesterday and besides his IV antibiotic I believe a oral antibiotic was also given and this may be Cipro. Patient is feeling fine otherwise does not have any symptoms though his blood pressure this morning in the wound center has been 90/50. We will check his blood pressure in the supine position. 06/16/2014 - 89yo undergoing HBO (s/p tx 13 of 40) for Wagner 3 L DFU and arterial insufficiency. Complained  of worsening fatigue yesterday after HBO. Feels better today. Has appointment with PCP this afternoon. He wishes to hold off on HBO until next week. On IV and po antibiotics. No significant pain. No fever or chills. Stable drainage. Objective Constitutional Pulse regular. Respirations normal and unlabored. Afebrile. Vitals Time Taken: 9:05 AM, Height: 70 in, Weight: 200 lbs, BMI: 28.7, Temperature: 97.5 F, Pulse: 70 bpm, Respiratory Rate: 17 breaths/min, Blood Pressure: 94/33 mmHg, Pulse Oximetry: 98 %. Psychiatric Judgement and insight Intact.. Oriented times 3.. No evidence of depression, anxiety, or agitation.. General Notes: Left heel ulcer x2, Achilles ulcer, and great toe ulcer. All full thickness. No probe to bone, but close. No significant cellulitis. No palpable pedal pulses per his baseline. 1+ pitting edema. No significant change in size but all appear cleaner with increased granulation tissue. Integumentary (Hair, Skin) Wound #1 status is Open. Original cause of wound was Gradually Appeared. The wound is located on the Left Achilles. The wound measures 7.3cm length x  5cm width x 0.7cm depth; 28.667cm^2 area and 20.067cm^3 volume. There is tendon exposed. There is no undermining noted. There is a medium amount of serous drainage noted. The wound margin is epibole. There is small (1-33%) pink granulation within the wound bed. There is a large (67-100%) amount of necrotic tissue within the wound bed including Eschar and Adherent Slough. The periwound skin appearance exhibited: Moist, Rubor, Erythema. The periwound skin appearance did not exhibit: Callus, Crepitus, Excoriation, Fluctuance, Friable, Induration, Localized Edema, Rash, Scarring, Dry/Scaly, Maceration, Atrophie Blanche, Cyanosis, Ecchymosis, Hemosiderin Hiers, [name] ([id number]) Staining, Mottled, Pallor. The surrounding wound skin color is noted with erythema which is circumferential. Periwound temperature was noted as No Abnormality. Wound #2 status is Open. Original cause of wound was Gradually Appeared. The wound is located on the Left,Lateral Calcaneous. The wound measures 1.1cm length x 3.7cm width x 0.4cm depth; 3.197cm^2 area and 1.279cm^3 volume. The wound is limited to skin breakdown. There is no tunneling noted. There is a small amount of serous drainage noted. The wound margin is distinct with the outline attached to the wound base. There is small (1-33%) pink granulation within the wound bed. There is a medium (34-66%) amount of necrotic tissue within the wound bed including Adherent Slough. The periwound skin appearance exhibited: Moist. The periwound skin appearance did not exhibit: Callus, Crepitus, Excoriation, Fluctuance, Friable, Induration, Localized Edema, Rash, Scarring, Dry/Scaly, Maceration, Atrophie Blanche, Cyanosis, Ecchymosis, Hemosiderin Staining, Mottled, Pallor, Rubor, Erythema. Periwound temperature was noted as No Abnormality. The periwound has tenderness on palpation. Wound #3 status is Open. Original cause of wound was Gradually Appeared. The wound is located on  the Left Toe Great. The wound measures 1cm length x 1cm width x 0.1cm depth; 0.785cm^2 area and 0.079cm^3 volume. The wound is limited to skin breakdown. There is no tunneling or undermining noted. There is a none present amount of drainage noted. The wound margin is distinct with the outline attached to the wound base. There is no granulation within the wound bed. There is a large (67-100%) amount of necrotic tissue within the wound bed including Eschar. The periwound skin appearance exhibited: Dry/Scaly. The periwound skin appearance did not exhibit: Callus, Crepitus, Excoriation, Fluctuance, Friable, Induration, Localized Edema, Rash, Scarring, Maceration, Moist, Atrophie Blanche, Cyanosis, Ecchymosis, Hemosiderin Staining, Mottled, Pallor, Rubor, Erythema. Periwound temperature was noted as No Abnormality. 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 Arterial insufficiency. Undergoing hyperbaric oxygen therapy. Procedures [name], [name] ([id number]) Wound #1 Wound #1 is an Arterial Insufficiency Ulcer located on the Left Achilles . There was a Non-Viable Tissue Open Wound/Selective [phone]) debridement with total area of 36.5 sq cm performed by BURNS III, Takira Sherrin. with the following instrument(s): Curette to remove Non-Viable tissue/material including Fibrin/Slough after achieving pain control using Lidocaine 4% Topical Solution. A time out was conducted prior to the start of the procedure. There was no bleeding. The procedure was tolerated well with a  pain level of 0 throughout and a pain level of 0 following the procedure. Post Debridement Measurements: 7.3cm length x 5cm width x 0.8cm depth; 22.934cm^3 volume. Wound #2 Wound #2 is an Arterial Insufficiency Ulcer located on the Left,Lateral Calcaneous . There was a Non- Viable Tissue Open Wound/Selective [phone]) debridement with total area of 4.07 sq cm performed by BURNS III, Braiden Presutti. with the following instrument(s): Curette to remove Non-Viable tissue/material including Fibrin/Slough after achieving pain control using Lidocaine 4% Topical Solution. A time out was conducted prior to the start of the procedure. There was no bleeding. The procedure was tolerated well with a pain level of 0 throughout and a pain level of 0 following the procedure. Post Debridement Measurements: 1.1cm length x 3.7cm width x 0.5cm depth; 1.598cm^3 volume. Wound #3 Wound #3 is an Arterial Insufficiency Ulcer located on the Left Toe Great . There was a Non-Viable Tissue Open Wound/Selective [phone]) debridement with total area of 1 sq cm performed by BURNS III, Lynnelle Mesmer. with the following instrument(s): Curette to remove Non-Viable tissue/material including Fibrin/Slough after achieving pain control using Lidocaine 4% Topical Solution. A time out was conducted prior to the start of the procedure. There was no bleeding. The procedure was tolerated well with a pain level of 0 throughout and a pain level of 0 following the procedure. Post Debridement Measurements: 1cm length x 1cm width x 0.2cm depth; 0.157cm^3 volume. Plan Wound Cleansing: Wound #1 Left Achilles: Clean wound with Normal Saline. May shower with protection. Wound #2 Left,Lateral Calcaneous: Clean wound with Normal Saline. May shower with protection. Wound #3 Left Toe Great: Clean wound with Normal Saline. May shower with protection. Anesthetic: Wound #1 Left Achilles: Topical Lidocaine 4% cream applied to wound bed prior to  debridement Wound #2 Left,Lateral Calcaneous: Topical Lidocaine 4% cream applied to wound bed prior to debridement Wound #3 Left Toe GreatLONDELL, NOLL ([id number]) Topical Lidocaine 4% cream applied to wound bed prior to debridement Skin Barriers/Peri-Wound Care: Wound #1 Left Achilles: Skin Prep Wound #2 Left,Lateral Calcaneous: Skin Prep Wound #3 Left Toe Great: Skin Prep Primary Wound Dressing: Wound #1 Left Achilles: Santyl Ointment Wound #2 Left,Lateral Calcaneous: Aquacel Ag Wound #3 Left Toe Great: Aquacel Ag Secondary Dressing: Wound #1 Left Achilles: Gauze, ABD and Kerlix/Conform Wound #2 Left,Lateral Calcaneous: Gauze, ABD and Kerlix/Conform Wound #3 Left Toe Great: Gauze, ABD and Kerlix/Conform Dressing Change Frequency: Wound #1 Left Achilles: Change dressing every day. Wound #2 Left,Lateral Calcaneous: Change dressing every day. Wound #3 Left Toe Great: Change dressing every day. Follow-up Appointments: Wound #1 Left Achilles: Return Appointment in 1 week. Wound #2 Left,Lateral Calcaneous: Return Appointment in 1 week. Wound #3 Left Toe Great: Return Appointment in  1 week. Additional Orders / Instructions: Wound #1 Left Achilles: Increase protein intake. Other: - goal is to keep blood sugars under 180 Wound #2 Left,Lateral Calcaneous: Increase protein intake. Other: - goal is to keep blood sugars under 180 Wound #3 Left Toe Great: Increase protein intake. Other: - goal is to keep blood sugars under 180 Home Health: Wound #1 Left Achilles: [facility] Nurse may visit PRN to address patient s wound care needs. FACE TO FACE ENCOUNTER: MEDICARE and MEDICAID PATIENTS: I certify that this patient is under Luhman, Hollis L. ([id number]) my care and that I had a face-to-face encounter that meets the physician face-to-face encounter requirements with this patient on this date. The encounter with the  patient was in whole or in part for the following MEDICAL CONDITION: (primary reason for Home Healthcare) MEDICAL NECESSITY: I certify, that based on my findings, NURSING services are a medically necessary home health service. HOME BOUND STATUS: I certify that my clinical findings support that this patient is homebound (i.e., Due to illness or injury, pt requires aid of supportive devices such as crutches, cane, wheelchairs, walkers, the use of special transportation or the assistance of another person to leave their place of residence. There is a normal inability to leave the home and doing so requires considerable and taxing effort. Other absences are for medical reasons / religious services and are infrequent or of short duration when for other reasons). If current dressing causes regression in wound condition, may D/C ordered dressing product/s and apply Normal Saline Moist Dressing daily until next Wound Healing Center / Other MD appointment. Notify Wound Healing Center of regression in wound condition at [phone]. Please direct any NON-WOUND related issues/requests for orders to patient's Primary Care Physician Wound #2 Left,Lateral Calcaneous: [facility] Nurse may visit PRN to address patient s wound care needs. FACE TO FACE ENCOUNTER: MEDICARE and MEDICAID PATIENTS: I certify that this patient is under my care and that I had a face-to-face encounter that meets the physician face-to-face encounter requirements with this patient on this date. The encounter with the patient was in whole or in part for the following MEDICAL CONDITION: (primary reason for Home Healthcare) MEDICAL NECESSITY: I certify, that based on my findings, NURSING services are a medically necessary home health service. HOME BOUND STATUS: I certify that my clinical findings support that this patient is homebound (i.e., Due to illness or injury, pt requires aid of  supportive devices such as crutches, cane, wheelchairs, walkers, the use of special transportation or the assistance of another person to leave their place of residence. There is a normal inability to leave the home and doing so requires considerable and taxing effort. Other absences are for medical reasons / religious services and are infrequent or of short duration when for other reasons). If current dressing causes regression in wound condition, may D/C ordered dressing product/s and apply Normal Saline Moist Dressing daily until next Wound Healing Center / Other MD appointment. Notify Wound Healing Center of regression in wound condition at [phone]. Please direct any NON-WOUND related issues/requests for orders to patient's Primary Care Physician Wound #3 Left Toe Great: [facility] Nurse may visit PRN to address patient s wound care needs. FACE TO FACE ENCOUNTER: MEDICARE and MEDICAID PATIENTS: I certify that this patient is under my care and that I had a face-to-face encounter that meets  the physician face-to-face encounter requirements with this patient on this date. The encounter with the patient was in whole or in part for the following MEDICAL CONDITION: (primary reason for Home Healthcare) MEDICAL NECESSITY: I certify, that based on my findings, NURSING services are a medically necessary home health service. HOME BOUND STATUS: I certify that my clinical findings support that this patient is homebound (i.e., Due to illness or injury, pt requires aid of supportive devices such as crutches, cane, wheelchairs, walkers, the use of special transportation or the assistance of another person to leave their place of residence. There is a normal inability to leave the home and doing so requires considerable and taxing effort. Other absences are for medical reasons / religious services and are infrequent or of short duration when for other  reasons). If current dressing causes regression in wound condition, may D/C ordered dressing product/s and apply Normal Saline Moist Dressing daily until next Wound Healing Center / Other MD appointment. Notify Wound Healing Center of regression in wound condition at [phone]. Please direct any NON-WOUND related issues/requests for orders to patient's Primary Care Physician Hyperbaric Oxygen Therapy: Wound #3 Left Toe Great: Indication: - osteomyelitis If appropriate for treatment, begin HBOT per protocol: Wortmann, Bram L. ([id number]) 2.0 ATA for 90 Minutes without Air Breaks One treatment per day (delivered Monday through Friday unless otherwise specified in Special Instructions below): Total # of Treatments: - 40 Finger stick Blood Glucose Pre- and Post- HBOT Treatment. Follow Hyperbaric Oxygen Glycemia Protocol Other - in chamber TCOM HBO Contraindications: Wound #3 Left Toe Great: HBO contraindications of hyperbaric oxygen therapy were reviewed and the patient found to have no untreated pneumothorax or history of spontaneous pneumothorax. HBO contraindications of hyperbaric oxygen therapy were reviewed and the patient found to have no history of medications such as Bleomycin, Adriamycin, disulfiram, cisplatin and sulfamylon and is not currently receiving any chemotherapy. HBO contraindications of hyperbaric oxygen therapy were reviewed and the patient found to have no Upper respiratory infection and chronic sinusitis. HBO contraindications of hyperbaric oxygen therapy were reviewed and the patient found to have no history of retinal surgery proceeding 6 weeks or intraocular gas HBO contraindications of hyperbaric oxygen therapy were reviewed and the patient found to have no history seizure disorder or any anticonvulsant medication. HBO contraindications of hyperbaric oxygen therapy were reviewed and the patient found to have no septicemia with CO2 retention. HBO  contraindications of hyperbaric oxygen therapy were reviewed and the patient found to have no fever greater than 100 degrees. HBO contraindications of hyperbaric oxygen therapy were reviewed and the patient found to have no pregnancy noted. HBO contraindications of hyperbaric oxygen therapy were reviewed and the patient found to have no medications such as steroids or narcotics or Phenergan. Medications-please add to medication list.: Wound #1 Left Achilles: Santyl Enzymatic Ointment Wound #2 Left,Lateral Calcaneous: Santyl Enzymatic Ointment Santyl to Achilles ulceration. Silver alginate to left heel and great toe ulcerations. Continue offloading measures. Continue antibiotics per ID recommendations. He is scheduled to see his primary care physician this afternoon and wishes to hold off on further HBO treatment until next week. In-chamber TCOM. Termini, Quinnton L. ([id number]) RTC 1 week. Electronic Signature(s) Signed: 06/16/2014 4:25:37 PM By: Madelaine Bhat MD Entered By: Madelaine Bhat on 06/16/2014 10:41:56 Gambone, [name] ([id number]) -------------------------------------------------------------------------------- SuperBill Details Patient Name: Bigler, Carnell L. Date of Service: 06/16/2014 Medical Record Number: [id number] Patient Account Number: [id number] Date of Birth/Sex: Jun 30, 1920 (81 y.o. 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 M86.372 Chronic multifocal osteomyelitis, left ankle and  foot Facility Procedures CPT4 Code Description: [id number] - DEBRIDE WOUND 1ST 20 SQ CM OR < ICD-10 Description Diagnosis E11.621 Type 2 diabetes mellitus with foot ulcer L97.323 Non-pressure chronic ulcer of left ankle with necrosi Modifier: s of muscle Quantity: 1 Physician Procedures CPT4 Code Description: [id number] - WC PHYS DEBR WO ANESTH 20 SQ CM ICD-10 Description Diagnosis E11.621 Type 2 diabetes mellitus with foot ulcer L97.323 Non-pressure chronic ulcer of left ankle with necrosi Modifier: s of muscle Quantity: 1 Electronic Signature(s) Signed: 06/16/2014 4:25:37 PM By: Madelaine Bhat MD Entered By: Madelaine Bhat on 06/16/2014 10:39:48

## 2014-06-21 ENCOUNTER — Encounter: Payer: Medicare Other | Admitting: Surgery

## 2014-06-22 ENCOUNTER — Encounter: Payer: Medicare Other | Admitting: Surgery

## 2014-06-22 LAB — CUP PACEART REMOTE DEVICE CHECK
Battery Impedance: 555 Ohm
Battery Remaining Longevity: 62 mo
Battery Voltage: 2.78 V
Brady Statistic AP VP Percent: 98 %
Brady Statistic AP VS Percent: 0 %
Brady Statistic AS VP Percent: 2 %
Brady Statistic AS VS Percent: 0 %
Date Time Interrogation Session: 20160526133905
Lead Channel Impedance Value: 394 Ohm
Lead Channel Impedance Value: 467 Ohm
Lead Channel Pacing Threshold Amplitude: 0.5 V
Lead Channel Pacing Threshold Pulse Width: 0.4 ms
Lead Channel Pacing Threshold Pulse Width: 0.4 ms
Lead Channel Setting Pacing Amplitude: 2 V
Lead Channel Setting Pacing Pulse Width: 0.4 ms
MDC IDC MSMT LEADCHNL RA PACING THRESHOLD AMPLITUDE: 0.625 V
MDC IDC SET LEADCHNL RV PACING AMPLITUDE: 2.5 V
MDC IDC SET LEADCHNL RV SENSING SENSITIVITY: 5.6 mV

## 2014-06-23 ENCOUNTER — Encounter: Payer: Medicare Other | Attending: Surgery | Admitting: Surgery

## 2014-06-23 ENCOUNTER — Encounter: Payer: Medicare Other | Admitting: Surgery

## 2014-06-23 DIAGNOSIS — L97323 Non-pressure chronic ulcer of left ankle with necrosis of muscle: Secondary | ICD-10-CM | POA: Diagnosis not present

## 2014-06-23 DIAGNOSIS — I70244 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: Secondary | ICD-10-CM | POA: Diagnosis not present

## 2014-06-23 DIAGNOSIS — E11621 Type 2 diabetes mellitus with foot ulcer: Secondary | ICD-10-CM | POA: Insufficient documentation

## 2014-06-23 DIAGNOSIS — I2510 Atherosclerotic heart disease of native coronary artery without angina pectoris: Secondary | ICD-10-CM | POA: Diagnosis not present

## 2014-06-23 DIAGNOSIS — M86372 Chronic multifocal osteomyelitis, left ankle and foot: Secondary | ICD-10-CM | POA: Diagnosis not present

## 2014-06-24 ENCOUNTER — Encounter: Payer: Medicare Other | Admitting: Surgery

## 2014-06-24 NOTE — Progress Notes (Addendum)
GEARLD, KERSTEIN ([id number]) Visit Report for 06/23/2014 Chief Complaint Document Details Patient Name: Dillehay, Jaspal L. Date of Service: 06/23/2014 8:45 AM Medical Record Number: [id number] Patient Account Number: [id number] Date of Birth/Sex: [birth year] (81 y.o. Male) Treating RN: Primary Care Physician: Lindwood Qua Other Clinician: Referring Physician: Lindwood Qua Treating Physician/Extender: Rudene Re in Treatment: 8 Information Obtained from: Patient Chief Complaint Patient presents to the wound care center for a consult due non healing wound 81 year old gentleman who comes with a history of having some ulcerated areas on his heels since February 2016 and then a large injury to his left posterior heel and ankle since about a month. Electronic Signature(s) Signed: 06/23/2014 12:32:56 PM By: Evlyn Kanner MD, FACS Entered By: Evlyn Kanner on 06/23/2014 09:30:05 Miner, [name] ([id number]) -------------------------------------------------------------------------------- Debridement Details Patient Name: Bradby, Joshwa L. Date of Service: 06/23/2014 8:45 AM Medical Record Number: [id number] Patient Account Number: [id number] Date of Birth/Sex: 08-22-1920 (81 y.o. Male) Treating RN: Primary Care Physician: Lindwood Qua Other Clinician: Referring Physician: Lindwood Qua Treating Physician/Extender: Rudene Re in Treatment: 8 Debridement Performed for Wound #1 Left Achilles Assessment: Performed By: Physician Tristan Schroeder., MD Debridement: Debridement Pre-procedure Yes Verification/Time Out Taken: Start Time: 09:19 Pain Control: Lidocaine 4% Topical Solution Level: Skin/Subcutaneous Tissue Total Area Debrided (L x 5 (cm) x 3 (cm) = 15 (cm) W): Tissue and other Viable, Non-Viable, Eschar, Fibrin/Slough, Ligament, Subcutaneous material debrided: Instrument: Forceps, Scissors Bleeding: Minimum Hemostasis Achieved: Silver Nitrate End Time:  09:23 Procedural Pain: 0 Post Procedural Pain: 0 Response to Treatment: Procedure was tolerated well Post Debridement Measurements of Total Wound Length: (cm) 7 Width: (cm) 4.5 Depth: (cm) 0.7 Volume: (cm) 17.318 Electronic Signature(s) Signed: 06/23/2014 12:32:56 PM By: Evlyn Kanner MD, FACS Entered By: Evlyn Kanner on 06/23/2014 09:28:43 Hauser, Walton Elbert Ewings ([id number]) -------------------------------------------------------------------------------- Debridement Details Patient Name: Fossum, Orlyn L. Date of Service: 06/23/2014 8:45 AM Medical Record Number: [id number] Patient Account Number: [id number] Date of Birth/Sex: September 10, 1920 (81 y.o. Male) Treating RN: Primary Care Physician: Lindwood Qua Other Clinician: Referring Physician: Lindwood Qua Treating Physician/Extender: Rudene Re in Treatment: 8 Debridement Performed for Wound #2 Left,Lateral Calcaneous Assessment: Performed By: Physician Tristan Schroeder., MD Debridement: Open Wound/Selective Debridement Selective Description: Pre-procedure Yes Verification/Time Out Taken: Start Time: 09:16 Pain Control: Lidocaine 4% Topical Solution Level: Non-Viable Tissue Total Area Debrided (L x 1 (cm) x 4 (cm) = 4 (cm) W): Tissue and other Non-Viable, Eschar, Skin material debrided: Instrument: Forceps, Scissors Bleeding: Minimum Hemostasis Achieved: Pressure End Time: 09:19 Procedural Pain: 0 Post Procedural Pain: 0 Response to Treatment: Procedure was tolerated well Post Debridement Measurements of Total Wound Length: (cm) 4 Width: (cm) 1 Depth: (cm) 0.3 Volume: (cm) 0.942 Electronic Signature(s) Signed: 06/23/2014 12:32:56 PM By: Evlyn Kanner MD, FACS Entered By: Evlyn Kanner on 06/23/2014 09:29:28 Eley, Estiben Elbert Ewings ([id number]) -------------------------------------------------------------------------------- Debridement Details Patient Name: Poynter, Hansford L. Date of Service: 06/23/2014 8:45  AM Medical Record Number: [id number] Patient Account Number: [id number] Date of Birth/Sex: 22-Oct-1920 (81 y.o. Male) Treating RN: Primary Care Physician: Lindwood Qua Other Clinician: Referring Physician: Lindwood Qua Treating Physician/Extender: Rudene Re in Treatment: 8 Debridement Performed for Wound #3 Left Toe Great Assessment: Performed By: Physician Tristan Schroeder., MD Debridement: Debridement Pre-procedure Yes Verification/Time Out Taken: Start Time: 09:11 Pain Control: Lidocaine 4% Topical Solution Level: Skin/Subcutaneous Tissue Total Area Debrided (L x 1 (cm) x 1.4 (cm) = 1.4 (cm) W): Tissue and other Viable, Non-Viable, Eschar, Fibrin/Slough, Skin, Subcutaneous material debrided: Instrument: Forceps, Scissors,  Other : gauze Bleeding: Minimum Hemostasis Achieved: Silver Nitrate End Time: 09:15 Procedural Pain: 0 Post Procedural Pain: 0 Response to Treatment: Procedure was tolerated well Post Debridement Measurements of Total Wound Length: (cm) 1 Width: (cm) 1.4 Depth: (cm) 1.1 Volume: (cm) 1.21 Electronic Signature(s) Signed: 06/23/2014 12:32:56 PM By: Evlyn Kanner MD, FACS Entered By: Evlyn Kanner on 06/23/2014 09:29:57 Iturralde, Jathan Elbert Ewings ([id number]) -------------------------------------------------------------------------------- HPI Details Patient Name: Mo, Zeplin L. Date of Service: 06/23/2014 8:45 AM Medical Record Number: [id number] Patient Account Number: [id number] Date of Birth/Sex: 1920-09-01 (81 y.o. Male) Treating RN: Primary Care Physician: Lindwood Qua Other Clinician: Referring Physician: Lindwood Qua Treating Physician/Extender: Rudene Re in Treatment: 8 History of Present Illness HPI Description: 81 year old gentleman who was known to be a diabetic for many years has peripheral neuropathy recently went to his primary care doctor for a punctured wound on his left heel which was something he had noticed.  The patient then was referred to a podiatrist who referred him to Dr. Wyn Quaker in the vascular surgery department and I understand the procedure was done on 03/14/2014 with a left lower extremity vascular procedure and stenting was done. Details of this are not available at the present time. The patient has been applying Neosporin to his leg and has not had any wound care addressed so far. patient has also had a history of coronary artery disease in the past and hasn't had a CABG and a pacemaker placement in the remote past.Other details and notes are pending. the patient is not in pain lives alone and has some home health and other aides coming to help him with his daily chores and meals. reviewing the vascular notes I understand the procedure was done on 03/14/2014 and he was operated by Dr. Wyn Quaker. he had a catheter placement to the left peroneal artery and a aortogram and selective left lower extremity angiogram was done. He also had a percutaneous transluminal angioplasty of the left peroneal artery and the tibioperoneal trunk. The distal SFA and above-knee popliteal artery were also angioplastied. A subcutaneous stent placement to the distal superficial femoral artery was also done. 05/05/2014 -- the patient has not had any change in his health and after much consideration is decided that he does not want HBOT as he is claustrophobic and was unable to tolerate being in the chamber for 90 minutes. I have discussed with him that his most recent x-ray of the foot shows that he has the distal phalanx of the left great toe showing changes with osteomyelitis cannot be excluded at that site. He tells me that he cannot have an MRI because of his defibrillator and hence we will order a triple phase bone scan. I have also reviewed his culture report which shows several organisms and he has sensitivity to tetracycline and we have recommended he takes this for 14 days and this has been given to him on  05/02/2014 05/12/2014 the bone scan done on 05/10/2014 shows #1 findings are worrisome for osteomyelitis involving the calcaneus of the left foot, #2 increase uptake localizing to the left second and third toe on all 3 phases. Cannot rule out osteomyelitis in this area. And #3 left foot cellulitis. 05/19/2014 -- reviewed several reports which we have received back on [facility]. #1 chest x-ray done on April 21 shows chronic changes in the left base no acute findings. #2 his CBC is within normal limits. #3 hemoglobin A1c was 8.5%. #4 EKG done on 26 April was within normal limits due to  his electronic ventricular pacemaker. 05/26/2014 -- he has had his PICC line placed and is taking vancomycin daily basis. He is to start hyperbaric oxygen therapy on this coming Monday. 06/09/2014 -- he saw Dr. Sampson Goon yesterday and besides his IV antibiotic I believe a oral antibiotic was CHAY, MAZZONI. ([id number]) also given and this may be Cipro. Patient is feeling fine otherwise does not have any symptoms though his blood pressure this morning in the wound center has been 90/50. We will check his blood pressure in the supine position. 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 Complained of worsening fatigue yesterday after HBO. Feels better today. Has appointment with PCP this afternoon. He wishes to hold off on HBO until next week. No significant pain. No fever or chills. Stable drainage. 06/23/2014 [facility] has taken a break from HBO T and has been feeling a little better. He was seen by the nurse practitioner at his PCPs office and at that time his vitals were stable and they did get some EKG done which was within normal limits. There have sent out some lab work which we still have to receive reports. He is going to see his PCP back this afternoon and will be seeing Dr. Sampson Goon tomorrow for review. addendum: we have received labs from his PCPs office and note  that his potassium is high at 5.4 and his BUN/creatinine is slightly raised. His blood sugar was 216. His total protein and albumen were 5.9 and 3 and this was low. His HandH was 10.3 and 31.4 WBC was 8.2 and his platelets were 304. CRP was 29.6 which was high. Vancomycin trough was 19.1 which was normal TSH was 3.11 which was normal Electronic Signature(s) Signed: 06/23/2014 12:32:56 PM By: Evlyn Kanner MD, FACS Entered By: Evlyn Kanner on 06/23/2014 12:30:41 Morrill, Heinrich Elbert Ewings ([id number]) -------------------------------------------------------------------------------- Physical Exam Details Patient Name: Greenhalgh, Dravon L. Date of Service: 06/23/2014 8:45 AM Medical Record Number: [id number] Patient Account Number: [id number] Date of Birth/Sex: 15-Feb-1920 (81 y.o. Male) Treating RN: Primary Care Physician: Lindwood Qua Other Clinician: Referring Physician: Lindwood Qua Treating Physician/Extender: Rudene Re in Treatment: 8 Constitutional . Pulse regular. Respirations normal and unlabored. Afebrile. . Eyes Nonicteric. Reactive to light. Ears, Nose, Mouth, and Throat Lips, teeth, and gums WNL.Marland Kitchen Moist mucosa without lesions . Neck supple and nontender. No palpable supraclavicular or cervical adenopathy. Normal sized without goiter. Respiratory WNL. No retractions.. Cardiovascular Pedal Pulses not palpable at the ankle. No clubbing, cyanosis or edema. Integumentary (Hair, Skin) most of his wounds still have some slough and debris and the chop debridement with scissors and forceps.. No crepitus or fluctuance. No peri-wound warmth or erythema. No masses.Marland Kitchen Psychiatric Judgement and insight Intact.. No evidence of depression, anxiety, or agitation.. Electronic Signature(s) Signed: 06/23/2014 12:32:56 PM By: Evlyn Kanner MD, FACS Entered By: Evlyn Kanner on 06/23/2014 09:33:44 Guilliams, [name]  ([id number]) -------------------------------------------------------------------------------- Physician Orders Details Patient Name: Hearld, Hawken L. Date of Service: 06/23/2014 8:45 AM Medical Record Number: [id number] Patient Account Number: [id number] Date of Birth/Sex: July 02, 1920 (81 y.o. Male) Treating RN: Curtis Sites Primary Care Physician: Lindwood Qua Other Clinician: Referring Physician: Lindwood Qua Treating Physician/Extender: Rudene Re in Treatment: 8 Verbal / Phone Orders: Yes Clinician: Curtis Sites Read Back and Verified: Yes Diagnosis Coding Wound Cleansing Wound #1 Left Achilles o Clean wound with Normal Saline. o May shower with protection. Wound #2 Left,Lateral Calcaneous o Clean wound with Normal Saline. o May shower with protection. Wound #3 Left  Toe Great o Clean wound with Normal Saline. o May shower with protection. Anesthetic Wound #1 Left Achilles o Topical Lidocaine 4% cream applied to wound bed prior to debridement Wound #2 Left,Lateral Calcaneous o Topical Lidocaine 4% cream applied to wound bed prior to debridement Wound #3 Left Toe Great o Topical Lidocaine 4% cream applied to wound bed prior to debridement Skin Barriers/Peri-Wound Care Wound #1 Left Achilles o Skin Prep Wound #2 Left,Lateral Calcaneous o Skin Prep Wound #3 Left Toe Great o Skin Prep Primary Wound Dressing Wound #1 Left Achilles o Santyl Ointment Hartshorne, Telvin L. ([id number]) Wound #2 Left,Lateral Calcaneous o Aquacel Ag Wound #3 Left Toe Great o Aquacel Ag Secondary Dressing Wound #1 Left Achilles o Gauze, ABD and Kerlix/Conform Wound #2 Left,Lateral Calcaneous o Gauze, ABD and Kerlix/Conform Wound #3 Left Toe Great o Gauze, ABD and Kerlix/Conform Dressing Change Frequency Wound #1 Left Achilles o Change dressing every day. Wound #2 Left,Lateral Calcaneous o Change dressing every day. Wound #3 Left Toe  Great o Change dressing every day. Follow-up Appointments Wound #1 Left Achilles o Return Appointment in 1 week. Wound #2 Left,Lateral Calcaneous o Return Appointment in 1 week. Wound #3 Left Toe Great o Return Appointment in 1 week. Additional Orders / Instructions Wound #1 Left Achilles o Increase protein intake. o Other: - goal is to keep blood sugars under 180 Wound #2 Left,Lateral Calcaneous o Increase protein intake. o Other: - goal is to keep blood sugars under 180 Wound #3 Left Toe Great o Increase protein intake. CASY, BRUNETTO ([id number]) o Other: - goal is to keep blood sugars under 180 Home Health Wound #1 Left Achilles o Continue Home Health Visits - [facility] o Home Health Nurse may visit PRN to address patientos wound care needs. o FACE TO FACE ENCOUNTER: MEDICARE and MEDICAID PATIENTS: I certify that this patient is under my care and that I had a face-to-face encounter that meets the physician face-to-face encounter requirements with this patient on this date. The encounter with the patient was in whole or in part for the following MEDICAL CONDITION: (primary reason for Home Healthcare) MEDICAL NECESSITY: I certify, that based on my findings, NURSING services are a medically necessary home health service. HOME BOUND STATUS: I certify that my clinical findings support that this patient is homebound (i.e., Due to illness or injury, pt requires aid of supportive devices such as crutches, cane, wheelchairs, walkers, the use of special transportation or the assistance of another person to leave their place of residence. There is a normal inability to leave the home and doing so requires considerable and taxing effort. Other absences are for medical reasons / religious services and are infrequent or of short duration when for other reasons). o If current dressing causes regression in wound condition, may D/C ordered dressing  product/s and apply Normal Saline Moist Dressing daily until next Wound Healing Center / Other MD appointment. Notify Wound Healing Center of regression in wound condition at [phone]. o Please direct any NON-WOUND related issues/requests for orders to patient's Primary Care Physician Wound #2 Left,Lateral Calcaneous o Continue Home Health Visits - [facility] o Home Health Nurse may visit PRN to address patientos wound care needs. o FACE TO FACE ENCOUNTER: MEDICARE and MEDICAID PATIENTS: I certify that this patient is under my care and that I had a face-to-face encounter that meets the physician face-to-face encounter requirements with this patient on this date. The encounter with the patient was in whole or in  part for the following MEDICAL CONDITION: (primary reason for Home Healthcare) MEDICAL NECESSITY: I certify, that based on my findings, NURSING services are a medically necessary home health service. HOME BOUND STATUS: I certify that my clinical findings support that this patient is homebound (i.e., Due to illness or injury, pt requires aid of supportive devices such as crutches, cane, wheelchairs, walkers, the use of special transportation or the assistance of another person to leave their place of residence. There is a normal inability to leave the home and doing so requires considerable and taxing effort. Other absences are for medical reasons / religious services and are infrequent or of short duration when for other reasons). o If current dressing causes regression in wound condition, may D/C ordered dressing product/s and apply Normal Saline Moist Dressing daily until next Wound Healing Center / Other MD appointment. Notify Wound Healing Center of regression in wound condition at [phone]. o Please direct any NON-WOUND related issues/requests for orders to patient's Primary Care Physician Wound #3 Left Toe Doretha Sou Continue Home Health Visits -  [facility] o Home Health Nurse may visit PRN to address patientos wound care needs. HITESH, FOUCHE ([id number]) o FACE TO FACE ENCOUNTER: MEDICARE and MEDICAID PATIENTS: I certify that this patient is under my care and that I had a face-to-face encounter that meets the physician face-to-face encounter requirements with this patient on this date. The encounter with the patient was in whole or in part for the following MEDICAL CONDITION: (primary reason for Home Healthcare) MEDICAL NECESSITY: I certify, that based on my findings, NURSING services are a medically necessary home health service. HOME BOUND STATUS: I certify that my clinical findings support that this patient is homebound (i.e., Due to illness or injury, pt requires aid of supportive devices such as crutches, cane, wheelchairs, walkers, the use of special transportation or the assistance of another person to leave their place of residence. There is a normal inability to leave the home and doing so requires considerable and taxing effort. Other absences are for medical reasons / religious services and are infrequent or of short duration when for other reasons). o If current dressing causes regression in wound condition, may D/C ordered dressing product/s and apply Normal Saline Moist Dressing daily until next Wound Healing Center / Other MD appointment. Notify Wound Healing Center of regression in wound condition at [phone]. o Please direct any NON-WOUND related issues/requests for orders to patient's Primary Care Physician Hyperbaric Oxygen Therapy Wound #3 Left Toe Great o Indication: - osteomyelitis o If appropriate for treatment, begin HBOT per protocol: o 2.0 ATA for 90 Minutes without Air Breaks o One treatment per day (delivered Monday through Friday unless otherwise specified in Special Instructions below): o Total # of Treatments: - 40 o Finger stick Blood Glucose Pre- and Post- HBOT  Treatment. o Follow Hyperbaric Oxygen Glycemia Protocol o Other - in chamber TCOM HBO Contraindications Wound #3 Left Toe Great - Left Lower Extremity o HBO contraindications of hyperbaric oxygen therapy were reviewed and the patient found to have no untreated pneumothorax or history of spontaneous pneumothorax. o HBO contraindications of hyperbaric oxygen therapy were reviewed and the patient found to have no history of medications such as Bleomycin, Adriamycin, disulfiram, cisplatin and sulfamylon and is not currently receiving any chemotherapy. o HBO contraindications of hyperbaric oxygen therapy were reviewed and the patient found to have no Upper respiratory infection and chronic sinusitis. o HBO contraindications of hyperbaric oxygen therapy were reviewed and the patient  found to have no history of retinal surgery proceeding 6 weeks or intraocular gas o HBO contraindications of hyperbaric oxygen therapy were reviewed and the patient found to have no history seizure disorder or any anticonvulsant medication. o HBO contraindications of hyperbaric oxygen therapy were reviewed and the patient found to have no septicemia with CO2 retention. o HBO contraindications of hyperbaric oxygen therapy were reviewed and the patient found to have no fever greater than 100 degrees. o HBO contraindications of hyperbaric oxygen therapy were reviewed and the patient found to have no pregnancy noted. Ma, Nashawn L. ([id number]) o HBO contraindications of hyperbaric oxygen therapy were reviewed and the patient found to have no medications such as steroids or narcotics or Phenergan. Medications-please add to medication list. Wound #1 Left Achilles - Left Lower Extremity o Santyl Enzymatic Ointment Wound #2 Left,Lateral Calcaneous - Left Lower Extremity o Santyl Enzymatic Ointment GLYCEMIA INTERVENTIONS PROTOCOL PRE-HBO GLYCEMIA INTERVENTIONS ACTION INTERVENTION Obtain  pre-HBO capillary blood 1 glucose (ensure physician order is in chart). A. Notify HBO physician and await physician orders. 2 If result is 70 mg/dl or below: B. If the result meets the hospital definition of a critical result, follow hospital policy. A. Give patient an 8 ounce Glucerna Shake, an 8 ounce Ensure, or 8 ounces of a Glucerna/Ensure equivalent dietary supplement*. B. Wait 30 minutes. If result is 71 mg/dl to 191 mg/dl: C. Retest patientos capillary blood glucose (CBG). D. If result greater than or equal to 110 mg/dl, proceed with HBO. If result less than 110 mg/dl, notify HBO physician and consider holding HBO. If result is 131 mg/dl to 478 mg/dl: A. Proceed with HBO. A. Notify HBO physician and await physician orders. B. It is recommended to hold HBO and do blood/urine ketone If result is 250 mg/dl or greater: testing. C. If the result meets the hospital definition of a critical result, follow hospital policy. POST-HBO GLYCEMIA INTERVENTIONS ACTION INTERVENTION Obtain post HBO capillary blood 1 glucose (ensure physician order is in chart). Streat, Heman L. ([id number]) 2 If result is 70 mg/dl or below: A. Notify HBO physician and await physician orders. B. If the result meets the hospital definition of a critical result, follow hospital policy. A. Give patient an 8 ounce Glucerna Shake, an 8 ounce Ensure, or 8 ounces of a Glucerna/Ensure equivalent dietary supplement*. B. Wait 15 minutes for symptoms of hypoglycemia (i.e. nervousness, anxiety, If result is 71 mg/dl to 657 mg/dl: sweating, chills, clamminess, irritability, confusion, tachycardia or dizziness). C. If patient asymptomatic, discharge patient. If patient symptomatic, repeat capillary blood glucose (CBG) and notify HBO physician. If result is 101 mg/dl to 846 mg/dl: A. Discharge patient. A. Notify HBO physician and await physician orders. B. It is recommended to do If result  is 250 mg/dl or greater: blood/urine ketone testing. C. If the result meets the hospital definition of a critical result, follow hospital policy. *Juice or candies are NOT equivalent products. If patient refuses the Glucerna or Ensure, please consult the hospital dietitian for an appropriate substitute. Electronic Signature(s) Signed: 06/23/2014 12:32:56 PM By: Evlyn Kanner MD, FACS Signed: 06/23/2014 5:10:45 PM By: Curtis Sites Entered By: Curtis Sites on 06/23/2014 09:25:48 Adinolfi, Hester Elbert Ewings ([id number]) -------------------------------------------------------------------------------- Problem List Details Patient Name: Pascale, Noeh L. Date of Service: 06/23/2014 8:45 AM Medical Record Number: [id number] Patient Account Number: [id number] Date of Birth/Sex: 01/24/1920 (81 y.o. 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 Date of Service: 06/23/2014 8:45 AM Medical Record Number: [id number] Patient Account Number: [id number] Date of  Birth/Sex: [birth year] (80 y.o. Male) Treating RN: Primary Care Physician: Lindwood Qua Other Clinician: Referring Physician: Lindwood Qua Treating Physician/Extender: Rudene Re in Treatment: 8 Subjective Chief Complaint Information obtained from Patient Patient presents to the wound care center for a consult due non healing wound 81 year old gentleman who comes with a history of having some ulcerated areas on his heels since February 2016 and then a large injury to his left posterior heel and ankle since about a month. History of Present Illness (HPI) 81 year old gentleman who was known to be a diabetic for many years has peripheral neuropathy recently went to his primary care doctor for a punctured wound on his left heel which was something he had noticed. The patient then was referred to a podiatrist who referred him to Dr. Wyn Quaker in the vascular surgery department and I understand the procedure was done on 03/14/2014 with a left lower extremity vascular procedure and stenting was done. Details of this are not available at the present time. The patient has been applying Neosporin to his leg and has not had any wound care addressed so far. patient has also had a history of coronary artery disease in the past and hasn't had a CABG and a pacemaker placement in the remote past.Other details and notes are pending. the patient is not in pain lives alone and has some home health and other aides coming to help him with his daily chores and meals. reviewing the vascular notes I understand the procedure was done on 03/14/2014 and he was operated by Dr. Wyn Quaker. he had a catheter placement to the left peroneal artery and a aortogram and selective left lower extremity angiogram was done. He also had a percutaneous transluminal angioplasty of the left peroneal artery and the tibioperoneal trunk. The distal SFA and above-knee popliteal artery were also angioplastied. A subcutaneous stent  placement to the distal superficial femoral artery was also done. 05/05/2014 -- the patient has not had any change in his health and after much consideration is decided that he does not want HBOT as he is claustrophobic and was unable to tolerate being in the chamber for 90 minutes. I have discussed with him that his most recent x-ray of the foot shows that he has the distal phalanx of the left great toe showing changes with osteomyelitis cannot be excluded at that site. He tells me that he cannot have an MRI because of his defibrillator and hence we will order a triple phase bone scan. I have also reviewed his culture report which shows several organisms and he has sensitivity to tetracycline and we have recommended he takes this for 14 days and this has been given to him on 05/02/2014 05/12/2014 the bone scan done on 05/10/2014 shows #1 findings are worrisome for osteomyelitis involving the calcaneus of  the left foot, #2 increase uptake localizing to the left second and third toe on all 3 phases. Cannot rule out osteomyelitis in this area. And #3 left foot cellulitis. 05/19/2014 -- reviewed several reports which we have received back on [facility]. #1 chest x-ray done on April 21 shows chronic changes in the left base no acute findings. Bua, Tyjai L. ([id number]) #2 his CBC is within normal limits. #3 hemoglobin A1c was 8.5%. #4 EKG done on 26 April was within normal limits due to his electronic ventricular pacemaker. 05/26/2014 -- he has had his PICC line placed and is taking vancomycin daily basis. He is to start hyperbaric oxygen therapy on this coming Monday. 06/09/2014 -- he saw Dr. Sampson Goon yesterday and besides his IV antibiotic I believe a oral antibiotic was also given and this may be Cipro. Patient is feeling fine otherwise does not have any symptoms though his blood pressure this morning in the wound center has been 90/50. We will check his blood pressure in the supine  position. 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 Complained of worsening fatigue yesterday after HBO. Feels better today. Has appointment with PCP this afternoon. He wishes to hold off on HBO until next week. No significant pain. No fever or chills. Stable drainage. 06/23/2014 Guinn has taken a break from HBO T and has been feeling a little better. He was seen by the nurse practitioner at his PCPs office and at that time his vitals were stable and they did get some EKG done which was within normal limits. There have sent out some lab work which we still have to receive reports. He is going to see his PCP back this afternoon and will be seeing Dr. Sampson Goon tomorrow for review. addendum: we have received labs from his PCPs office and note that his potassium is high at 5.4 and his BUN/creatinine is slightly raised. His blood sugar was 216. His total protein and albumen were 5.9 and 3 and this was low. His HandH was 10.3 and 31.4 WBC was 8.2 and his platelets were 304. CRP was 29.6 which was high. Vancomycin trough was 19.1 which was normal TSH was 3.11 which was normal Objective Constitutional Pulse regular. Respirations normal and unlabored. Afebrile. Vitals Time Taken: 8:46 AM, Height: 70 in, Weight: 200 lbs, BMI: 28.7, Temperature: 98.0 F, Pulse: 87 bpm, Respiratory Rate: 18 breaths/min, Blood Pressure: 124/48 mmHg. Eyes Nonicteric. Reactive to light. Ears, Nose, Mouth, and Throat Lips, teeth, and gums WNL.Marland Kitchen Moist mucosa without lesions . Neck supple and nontender. No palpable supraclavicular or cervical adenopathy. Normal sized without goiter. Wanzer, Carrell L. ([id number]) Respiratory WNL. No retractions.. Cardiovascular Pedal Pulses not palpable at the ankle. No clubbing, cyanosis or edema. Psychiatric Judgement and insight Intact.. No evidence of depression, anxiety, or agitation.. Integumentary (Hair, Skin) most of his wounds still  have some slough and debris and the chop debridement with scissors and forceps.. No crepitus or fluctuance. No peri-wound warmth or erythema. No masses.. Wound #1 status is Open. Original cause of wound was Gradually Appeared. The wound is located on the Left Achilles. The wound measures 7cm length x 4.5cm width x 0.7cm depth; 24.74cm^2 area and 17.318cm^3 volume. There is tendon exposed. There is a medium amount of serous drainage noted. The wound margin is epibole. There is small (1-33%) pink granulation within the wound bed. There is a large (67-100%) amount of necrotic tissue within the wound bed including Eschar and Adherent Slough. The periwound  skin appearance exhibited: Moist, Rubor, Erythema. The periwound skin appearance did not exhibit: Callus, Crepitus, Excoriation, Fluctuance, Friable, Induration, Localized Edema, Rash, Scarring, Dry/Scaly, Maceration, Atrophie Blanche, Cyanosis, Ecchymosis, Hemosiderin Staining, Mottled, Pallor. The surrounding wound skin color is noted with erythema which is circumferential. Periwound temperature was noted as No Abnormality. Wound #2 status is Open. Original cause of wound was Gradually Appeared. The wound is located on the Left,Lateral Calcaneous. The wound measures 1cm length x 4cm width x 0.3cm depth; 3.142cm^2 area and 0.942cm^3 volume. The wound is limited to skin breakdown. There is a small amount of serous drainage noted. The wound margin is distinct with the outline attached to the wound base. There is small (1-33%) pink granulation within the wound bed. There is a medium (34-66%) amount of necrotic tissue within the wound bed including Adherent Slough. The periwound skin appearance exhibited: Dry/Scaly, Moist. The periwound skin appearance did not exhibit: Callus, Crepitus, Excoriation, Fluctuance, Friable, Induration, Localized Edema, Rash, Scarring, Maceration, Atrophie Blanche, Cyanosis, Ecchymosis, Hemosiderin Staining, Mottled,  Pallor, Rubor, Erythema. Periwound temperature was noted as No Abnormality. The periwound has tenderness on palpation. Wound #3 status is Open. Original cause of wound was Gradually Appeared. The wound is located on the Left Toe Great. The wound measures 1cm length x 1.4cm width x 0.1cm depth; 1.1cm^2 area and 0.11cm^3 volume. The wound is limited to skin breakdown. There is a none present amount of drainage noted. The wound margin is distinct with the outline attached to the wound base. There is no granulation within the wound bed. There is a large (67-100%) amount of necrotic tissue within the wound bed including Eschar. The periwound skin appearance exhibited: Dry/Scaly. The periwound skin appearance did not exhibit: Callus, Crepitus, Excoriation, Fluctuance, Friable, Induration, Localized Edema, Rash, Scarring, Maceration, Moist, Atrophie Blanche, Cyanosis, Ecchymosis, Hemosiderin Staining, Mottled, Pallor, Rubor, Erythema. Periwound temperature was noted as No Abnormality. 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 He is also receiving antibiotics for his osteomyelitis. we'll continue with local care and hold his hyperbaric oxygen therapy for this week. Review his lab work and discussed with Dr. Sampson Goon tomorrow after he has been seen and we will then take a decision whether we should continue hyperbaric oxygen therapy starting Monday. I have also asked him  to increase his intake of proteins and nutrition in general along with probiotics and multivitamins. We will call him back on Friday and let him know about his decision regarding possible starting of hyperbaric oxygen therapy on Monday morning. 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 There was a Skin/Subcutaneous Tissue Debridement ([id number]) debridement with total area of 15 sq cm performed by Muriel Wilber, Ignacia Felling., MD. with the following instrument(s): Forceps and Scissors to remove Viable and Non-Viable tissue/material including Fibrin/Slough, Ligament, Eschar, and Subcutaneous after achieving pain control using Lidocaine 4% Topical Solution. A time out was conducted prior to the start of the procedure. A Minimum amount of bleeding was controlled with Silver Nitrate. The procedure was  tolerated well with a pain level of 0 throughout and a pain level of 0 following the procedure. Post Debridement Measurements: 7cm length x 4.5cm width x 0.7cm depth; 17.318cm^3 volume. Wound #2 Wound #2 is an Arterial Insufficiency Ulcer located on the Left,Lateral Calcaneous . There was a Non- Viable Tissue Open Wound/Selective [id number]) debridement with total area of 4 sq cm performed by Eryx Zane, Ignacia Felling., MD. with the following instrument(s): Forceps and Scissors to remove Non-Viable tissue/material including Eschar and Skin after achieving pain control using Lidocaine 4% Topical Solution. A time out was conducted prior to the start of the procedure. A Minimum amount of bleeding was controlled with Pressure. The procedure was tolerated well with a pain level of 0 throughout and a pain level of 0 following the procedure. Post Debridement Measurements: 4cm length x 1cm width x 0.3cm depth; Selden, Rafan L. ([id number]) 0.942cm^3 volume. Wound #3 Wound #3 is an Arterial Insufficiency Ulcer located on the Left Toe Great . There was a  Skin/Subcutaneous Tissue Debridement ([id number]) debridement with total area of 1.4 sq cm performed by Solash Tullo, Ignacia Felling., MD. with the following instrument(s): Forceps, gauze, and Scissors to remove Viable and Non-Viable tissue/material including Fibrin/Slough, Eschar, Skin, and Subcutaneous after achieving pain control using Lidocaine 4% Topical Solution. A time out was conducted prior to the start of the procedure. A Minimum amount of bleeding was controlled with Silver Nitrate. The procedure was tolerated well with a pain level of 0 throughout and a pain level of 0 following the procedure. Post Debridement Measurements: 1cm length x 1.4cm width x 1.1cm depth; 1.21cm^3 volume. Plan Wound Cleansing: Wound #1 Left Achilles: Clean wound with Normal Saline. May shower with protection. Wound #2 Left,Lateral Calcaneous: Clean wound with Normal Saline. May shower with protection. Wound #3 Left Toe Great: Clean wound with Normal Saline. May shower with protection. Anesthetic: Wound #1 Left Achilles: Topical Lidocaine 4% cream applied to wound bed prior to debridement Wound #2 Left,Lateral Calcaneous: Topical Lidocaine 4% cream applied to wound bed prior to debridement Wound #3 Left Toe Great: Topical Lidocaine 4% cream applied to wound bed prior to debridement Skin Barriers/Peri-Wound Care: Wound #1 Left Achilles: Skin Prep Wound #2 Left,Lateral Calcaneous: Skin Prep Wound #3 Left Toe Great: Skin Prep Primary Wound Dressing: Wound #1 Left Achilles: Santyl Ointment Wound #2 Left,Lateral Calcaneous: Aquacel Ag Wound #3 Left Toe Great: Aquacel Ag Secondary Dressing: Talford, Deo L. ([id number]) Wound #1 Left Achilles: Gauze, ABD and Kerlix/Conform Wound #2 Left,Lateral Calcaneous: Gauze, ABD and Kerlix/Conform Wound #3 Left Toe Great: Gauze, ABD and Kerlix/Conform Dressing Change Frequency: Wound #1 Left Achilles: Change dressing every day. Wound #2 Left,Lateral  Calcaneous: Change dressing every day. Wound #3 Left Toe Great: Change dressing every day. Follow-up Appointments: Wound #1 Left Achilles: Return Appointment in 1 week. Wound #2 Left,Lateral Calcaneous: Return Appointment in 1 week. Wound #3 Left Toe Great: Return Appointment in 1 week. Additional Orders / Instructions: Wound #1 Left Achilles: Increase protein intake. Other: - goal is to keep blood sugars under 180 Wound #2 Left,Lateral Calcaneous: Increase protein intake. Other: - goal is to keep blood sugars under 180 Wound #3 Left Toe Great: Increase protein intake. Other: - goal is to keep blood sugars under 180 Home Health: Wound #1 Left Achilles: Continue Home Health Visits - [facility] Nurse may visit PRN to address patient s wound care needs. FACE TO FACE ENCOUNTER: MEDICARE and MEDICAID PATIENTS: I certify that this patient is under my care and  that I had a face-to-face encounter that meets the physician face-to-face encounter requirements with this patient on this date. The encounter with the patient was in whole or in part for the following MEDICAL CONDITION: (primary reason for Home Healthcare) MEDICAL NECESSITY: I certify, that based on my findings, NURSING services are a medically necessary home health service. HOME BOUND STATUS: I certify that my clinical findings support that this patient is homebound (i.e., Due to illness or injury, pt requires aid of supportive devices such as crutches, cane, wheelchairs, walkers, the use of special transportation or the assistance of another person to leave their place of residence. There is a normal inability to leave the home and doing so requires considerable and taxing effort. Other absences are for medical reasons / religious services and are infrequent or of short duration when for other reasons). If current dressing causes regression in wound condition, may D/C ordered dressing product/s and  apply Normal Saline Moist Dressing daily until next Wound Healing Center / Other MD appointment. Notify Wound Healing Center of regression in wound condition at [phone]. Please direct any NON-WOUND related issues/requests for orders to patient's Primary Care Physician Wound #2 Left,Lateral Calcaneous: Continue Home Health Visits - [facility] Nurse may visit PRN to address patient s wound care needs. DHEERAJ, HAIL ([id number]) FACE TO FACE ENCOUNTER: MEDICARE and MEDICAID PATIENTS: I certify that this patient is under my care and that I had a face-to-face encounter that meets the physician face-to-face encounter requirements with this patient on this date. The encounter with the patient was in whole or in part for the following MEDICAL CONDITION: (primary reason for Home Healthcare) MEDICAL NECESSITY: I certify, that based on my findings, NURSING services are a medically necessary home health service. HOME BOUND STATUS: I certify that my clinical findings support that this patient is homebound (i.e., Due to illness or injury, pt requires aid of supportive devices such as crutches, cane, wheelchairs, walkers, the use of special transportation or the assistance of another person to leave their place of residence. There is a normal inability to leave the home and doing so requires considerable and taxing effort. Other absences are for medical reasons / religious services and are infrequent or of short duration when for other reasons). If current dressing causes regression in wound condition, may D/C ordered dressing product/s and apply Normal Saline Moist Dressing daily until next Wound Healing Center / Other MD appointment. Notify Wound Healing Center of regression in wound condition at [phone]. Please direct any NON-WOUND related issues/requests for orders to patient's Primary Care Physician Wound #3 Left Toe Great: Continue Home Health Visits - [facility] Nurse may visit PRN to address patient s wound care needs. FACE TO FACE ENCOUNTER: MEDICARE and MEDICAID PATIENTS: I certify that this patient is under my care and that I had a face-to-face encounter that meets the physician face-to-face encounter requirements with this patient on this date. The encounter with the patient was in whole or in part for the following MEDICAL CONDITION: (primary reason for Home Healthcare) MEDICAL NECESSITY: I certify, that based on my findings, NURSING services are a medically necessary home health service. HOME BOUND STATUS: I certify that my clinical findings support that this patient is homebound (i.e., Due to illness or injury, pt requires aid of supportive devices such as crutches, cane, wheelchairs, walkers, the use of special transportation or the assistance of another person to leave their place of residence. There is a  normal inability to leave the home and doing so requires considerable and taxing effort. Other absences are for medical reasons / religious services and are infrequent or of short duration when for other reasons). If current dressing causes regression in wound condition, may D/C ordered dressing product/s and apply Normal Saline Moist Dressing daily until next Wound Healing Center / Other MD appointment. Notify Wound Healing Center of regression in wound condition at [phone]. Please direct any NON-WOUND related issues/requests for orders to patient's Primary Care Physician Hyperbaric Oxygen Therapy: Wound #3 Left Toe Great: Indication: - osteomyelitis If appropriate for treatment, begin HBOT per protocol: 2.0 ATA for 90 Minutes without Air Breaks One treatment per day (delivered Monday through Friday unless otherwise specified in Special Instructions below): Total # of Treatments: - 40 Finger stick Blood Glucose Pre- and Post- HBOT Treatment. Follow Hyperbaric Oxygen Glycemia Protocol Other - in chamber TCOM HBO  Contraindications: Wound #3 Left Toe Great: HBO contraindications of hyperbaric oxygen therapy were reviewed and the patient found to have no untreated pneumothorax or history of spontaneous pneumothorax. HBO contraindications of hyperbaric oxygen therapy were reviewed and the patient found to have no history of medications such as Bleomycin, Adriamycin, disulfiram, cisplatin and sulfamylon and is not currently receiving any chemotherapy. HBO contraindications of hyperbaric oxygen therapy were reviewed and the patient found to have no Upper respiratory infection and chronic sinusitis. [name], Armen L. ([id number]) HBO contraindications of hyperbaric oxygen therapy were reviewed and the patient found to have no history of retinal surgery proceeding 6 weeks or intraocular gas HBO contraindications of hyperbaric oxygen therapy were reviewed and the patient found to have no history seizure disorder or any anticonvulsant medication. HBO contraindications of hyperbaric oxygen therapy were reviewed and the patient found to have no septicemia with CO2 retention. HBO contraindications of hyperbaric oxygen therapy were reviewed and the patient found to have no fever greater than 100 degrees. HBO contraindications of hyperbaric oxygen therapy were reviewed and the patient found to have no pregnancy noted. HBO contraindications of hyperbaric oxygen therapy were reviewed and the patient found to have no medications such as steroids or narcotics or Phenergan. Medications-please add to medication list.: Wound #1 Left Achilles: Santyl Enzymatic Ointment Wound #2 Left,Lateral Calcaneous: Santyl Enzymatic Ointment Eliberto Ivoryustin is slowly bouncing back from all his polytherapy which she's been receiving for his gangrenous Wagner stage III ulceration of his left foot. He is also receiving antibiotics for his osteomyelitis. we'll continue with local care and hold his hyperbaric oxygen therapy for this week. Review  his lab work and discussed with Dr. Sampson GoonFitzgerald tomorrow after he has been seen and we will then take a decision whether we should continue hyperbaric oxygen therapy starting Monday. I have also asked him to increase his intake of proteins and nutrition in general along with probiotics and multivitamins. We will call him back on Friday and let him know about his decision regarding possible starting of hyperbaric oxygen therapy on Monday morning. Electronic Signature(s) Signed: 06/24/2014 12:18:49 PM By: Evlyn KannerBritto, Damario Gillie MD, FACS Previous Signature: 06/23/2014 12:32:56 PM Version By: Evlyn KannerBritto, Adison Reifsteck MD, FACS Entered By: Evlyn KannerBritto, Lesslie Mckeehan on 06/24/2014 12:16:11 Polendo, Raoul [name] ([id number]) -------------------------------------------------------------------------------- SuperBill Details Patient Name: Ginger, Nichlos L. Date of Service: 06/23/2014 Medical Record Number: [id number] Patient Account Number: [id number] Date of Birth/Sex: 1920-09-04 77(81 y.o. 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 M86.372 Chronic multifocal osteomyelitis, left ankle and foot Facility Procedures CPT4: Description Modifier Quantity Code [id number] - DEB SUBQ TISSUE 20 SQ CM/< 1 ICD-10 Description Diagnosis E11.621 Type 2 diabetes mellitus with foot ulcer E11.52 Type 2 diabetes mellitus with diabetic peripheral angiopathy with gangrene I70.244  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 CPT4: [id number] - DEBRIDE  WOUND 1ST 20 SQ CM OR < 1 ICD-10 Description Diagnosis E11.621 Type 2 diabetes mellitus with foot ulcer M86.372 Chronic multifocal osteomyelitis, left ankle and foot 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

## 2014-06-24 NOTE — Progress Notes (Signed)
SADIK, PIASCIK ([id number]) Visit Report for 06/23/2014 Arrival Information Details Patient Name: [name], [name]. Date of Service: 06/23/2014 8:45 AM Medical Record Number: [id number] Patient Account Number: [id number] Date of Birth/Sex: May 13, 1920 (81 y.o. Male) Treating RN: Huel Coventry Primary Care Physician: Lindwood Qua Other Clinician: Referring Physician: Lindwood Qua Treating Physician/Extender: Rudene Re in Treatment: 8 Visit Information History Since Last Visit Added or deleted any medications: No Patient Arrived: Wheel Chair Any new allergies or adverse reactions: No Arrival Time: 08:45 Had a fall or experienced change in No activities of daily living that may affect Accompanied By: son risk of falls: Transfer Assistance: None Signs or symptoms of abuse/neglect since last No Patient Identification Verified: Yes visito Secondary Verification Process Yes Hospitalized since last visit: No Completed: Has Dressing in Place as Prescribed: Yes Patient Requires Transmission-Based No Pain Present Now: No Precautions: Patient Has Alerts: No Electronic Signature(s) Signed: 06/24/2014 1:28:45 PM By: Elliot Gurney, RN, BSN, Kim RN, BSN Entered By: Elliot Gurney, RN, BSN, Kim on 06/23/2014 08:46:09 Yaffe, [name] ([id number]) -------------------------------------------------------------------------------- Encounter Discharge Information Details Patient Name: [name], [name] Date of Service: 06/23/2014 8:45 AM Medical Record Number: [id number] Patient Account Number: [id number] Date of Birth/Sex: December 23, 1920 (81 y.o. Male) Treating RN: Primary Care Physician: Lindwood Qua Other Clinician: Referring Physician: Lindwood Qua Treating Physician/Extender: Rudene Re in Treatment: 8 Encounter Discharge Information Items Discharge Pain Level: 0 Discharge Condition: Stable Ambulatory Status: Wheelchair Discharge Destination: Home Transportation: Private Auto Accompanied  By: son Schedule Follow-up Appointment: Yes Medication Reconciliation completed Yes and provided to Patient/Care Deja Pisarski: Provided on Clinical Summary of Care: 06/23/2014 Form Type Recipient Paper Patient AE Electronic Signature(s) Signed: 06/24/2014 1:28:45 PM By: Elliot Gurney RN, BSN, Kim RN, BSN Previous Signature: 06/23/2014 9:37:59 AM Version By: Gwenlyn Perking Entered By: Elliot Gurney RN, BSN, Kim on 06/23/2014 09:38:39 Kassel, [name] ([id number]) -------------------------------------------------------------------------------- Lower Extremity Assessment Details Patient Name: [name], [name] Date of Service: 06/23/2014 8:45 AM Medical Record Number: [id number] Patient Account Number: [id number] Date of Birth/Sex: [birth year] (81 y.o. Male) Treating RN: Huel Coventry Primary Care Physician: Lindwood Qua Other Clinician: Referring Physician: Lindwood Qua Treating Physician/Extender: Rudene Re in Treatment: 8 Vascular Assessment Pulses: Posterior Tibial Dorsalis Pedis Palpable: [Left:Yes] Extremity colors, hair growth, and conditions: Extremity Color: [Left:Pale] Hair Growth on Extremity: [Left:No] Temperature of Extremity: [Left:Warm] Capillary Refill: [Left:< 3 seconds] Toe Nail Assessment Left: Right: Thick: Yes Discolored: Yes Deformed: Yes Improper Length and Hygiene: Yes Electronic Signature(s) Signed: 06/24/2014 1:28:45 PM By: Elliot Gurney, RN, BSN, Kim RN, BSN Entered By: Elliot Gurney, RN, BSN, Kim on 06/23/2014 08:52:34 Schreck, [name] ([id number]) -------------------------------------------------------------------------------- Multi Wound Chart Details Patient Name: [name], [name] Date of Service: 06/23/2014 8:45 AM Medical Record Number: [id number] Patient Account Number: [id number] Date of Birth/Sex: 1920-05-20 (81 y.o. 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 Comorbid History: Cataracts, Chronic sinus Cataracts, Chronic sinus Cataracts, Chronic sinus problems/congestion, problems/congestion, problems/congestion, Congestive Heart Failure, Congestive Heart Failure, Congestive Heart Failure, Coronary Artery Disease, Coronary Artery Disease, Coronary Artery Disease, Hypertension, Myocardial Hypertension, Myocardial Hypertension, Myocardial Infarction, Peripheral Infarction, Peripheral Infarction, Peripheral Arterial Disease, Type II  Arterial Disease, Type II Arterial Disease, Type II Diabetes, Neuropathy Diabetes, Neuropathy Diabetes, Neuropathy Date Acquired: 01/24/2014 01/24/2014 01/24/2014 Weeks of Treatment: 8 8 8  Wound Status: Open Open Open Clustered Wound: No Yes No Pending Amputation on Yes No No Presentation: 7x4.5x0.7 1x4x0.3 1x1.4x0.1 Chilcott, Landen L. ([id number]) Measurements L x W x D (cm) Area (cm) : 24.74 3.142 1.1 Volume (cm) : 17.318 0.942 0.11 % Reduction in Area: 0.00% 44.40% -27.30% % Reduction in Volume: -133.40% -66.70% -27.90% Classification: Full Thickness With Full Thickness Without Unclassifiable Exposed Support Exposed Support Structures Structures HBO Classification: Grade 3 Grade 1 Grade 0 Wagner Verification: Abscess N/A N/A Exudate Amount: Medium Small None Present Exudate Type: Serous Serous N/A Exudate Color: amber amber N/A Wound Margin: Epibole Distinct, outline attached Distinct, outline attached Granulation Amount: Small (1-33%) Small  (1-33%) None Present (0%) Granulation Quality: Pink Pink N/A Necrotic Amount: Large (67-100%) Medium (34-66%) Large (67-100%) Necrotic Tissue: Eschar, Adherent Slough Adherent Slough Eschar Exposed Structures: Tendon: Yes Fascia: No Fascia: No Fascia: No Fat: No Fat: No Fat: No Tendon: No Tendon: No Muscle: No Muscle: No Muscle: No Joint: No Joint: No Joint: No Bone: No Bone: No Bone: No Limited to Skin Limited to Skin Breakdown Breakdown Epithelialization: None None None Periwound Skin Texture: Edema: No Edema: No Edema: No Excoriation: No Excoriation: No Excoriation: No Induration: No Induration: No Induration: No Callus: No Callus: No Callus: No Crepitus: No Crepitus: No Crepitus: No Fluctuance: No Fluctuance: No Fluctuance: No Friable: No Friable: No Friable: No Rash: No Rash: No Rash: No Scarring: No Scarring: No Scarring: No Periwound Skin Moist: Yes Moist: Yes Dry/Scaly: Yes Moisture: Maceration: No Dry/Scaly: Yes Maceration: No Dry/Scaly: No Maceration: No Moist: No Periwound Skin Color: Erythema: Yes Atrophie Blanche: No Atrophie Blanche: No Rubor: Yes Cyanosis: No Cyanosis: No Atrophie Blanche: No Ecchymosis: No Ecchymosis: No Cyanosis: No Erythema: No Erythema: No Ecchymosis: No Hemosiderin Staining: No Hemosiderin Staining: No Hemosiderin Staining: No Mottled: No Mottled: No Mottled: No Pallor: No Pallor: No Pallor: No Rubor: No Rubor: No Erythema Location: Circumferential N/A N/A Erythema Change: No Change N/A N/A Kloth, Maynard L. ([id number]) Temperature: No Abnormality No Abnormality No Abnormality Tenderness on No Yes No Palpation: Wound Preparation: Ulcer Cleansing: Ulcer Cleansing: Ulcer Cleansing: Rinsed/Irrigated with Rinsed/Irrigated with Rinsed/Irrigated with Saline Saline Saline Topical Anesthetic Topical Anesthetic Topical Anesthetic Applied: Other: lidocaine Applied: Other: lidocaine Applied:  Other: 4% 4% LIDOCAINE 4% Treatment Notes Electronic Signature(s) Signed: 06/23/2014 5:10:45 PM By: Curtis Sitesorthy, Joanna Entered By: Curtis Sitesorthy, Joanna on 06/23/2014 09:07:27 [name], [name] ([id number]) -------------------------------------------------------------------------------- Multi-Disciplinary Care Plan Details Patient Name: [name], [name] Date of Service: 06/23/2014 8:45 AM Medical Record Number: [id number] Patient Account Number: [id number] Date of Birth/Sex: Sep 19, 1920 [age] y.o. Male) Treating RN: Curtis Sitesorthy, Joanna Primary Care Physician: [name], [name] Other Clinician: Referring Physician: [name], [name] Treating Physician/Extender: Rudene ReBritto, Errol Weeks in Treatment: 8 Active Inactive Abuse / Safety / Falls / Self Care Management Nursing Diagnoses: Potential for falls Goals: Patient will remain injury free Date Initiated: 04/28/2014 Goal Status: Active Interventions: Assess fall risk on admission and as needed Notes: Necrotic Tissue Nursing Diagnoses: Impaired tissue integrity related to necrotic/devitalized tissue Goals: Necrotic/devitalized tissue will be minimized in the wound bed Date Initiated: 04/28/2014 Goal Status: Active Interventions: Provide education on necrotic tissue and debridement process Treatment Activities: Apply topical anesthetic as ordered : 06/23/2014 Notes: Nutrition Nursing Diagnoses: Potential for alteratiion in Nutrition/Potential for imbalanced nutrition Studzinski, Jontae L. ([id number]) Goals: Patient/caregiver agrees to and verbalizes understanding of need to use  nutritional supplements and/or vitamins as prescribed Date Initiated: 04/28/2014 Goal Status: Active Interventions: Assess patient nutrition upon admission and as needed per policy Notes: Orientation to the Wound Care Program Nursing Diagnoses: Knowledge deficit related to the wound healing center program Goals: Patient/caregiver will verbalize understanding of the Wound Healing Center  Program Date Initiated: 04/28/2014 Goal Status: Active Interventions: Provide education on orientation to the wound center Notes: Wound/Skin Impairment Nursing Diagnoses: Impaired tissue integrity Goals: Ulcer/skin breakdown will have a volume reduction of 30% by week 4 Date Initiated: 04/28/2014 Goal Status: Active Interventions: Assess ulceration(s) every visit Notes: Electronic Signature(s) Signed: 06/23/2014 5:10:45 PM By: Curtis Sites Entered By: Curtis Sites on 06/23/2014 09:07:20 [name], [name] ([id number]) -------------------------------------------------------------------------------- Pain Assessment Details Patient Name: [name], [name] Date of Service: 06/23/2014 8:45 AM Medical Record Number: [id number] Patient Account Number: [id number] Date of Birth/Sex: 1920-09-16 (81 y.o. Male) Treating RN: Huel Coventry Primary Care Physician: Lindwood Qua Other Clinician: Referring Physician: Lindwood Qua Treating Physician/Extender: Rudene Re in Treatment: 8 Active Problems Location of Pain Severity and Description of Pain Patient Has Paino No Site Locations Pain Management and Medication Current Pain Management: Electronic Signature(s) Signed: 06/24/2014 1:28:45 PM By: Elliot Gurney, RN, BSN, Kim RN, BSN Entered By: Elliot Gurney, RN, BSN, Kim on 06/23/2014 08:46:15 [name], [name] ([id number]) -------------------------------------------------------------------------------- Patient/Caregiver Education Details Patient Name: [name], [name] Date of Service: 06/23/2014 8:45 AM Medical Record Number: [id number] Patient Account Number: [id number] Date of Birth/Gender: 09/04/1920 (81 y.o. Male) Treating RN: Huel Coventry Primary Care Physician: Lindwood Qua Other Clinician: Referring Physician: Lindwood Qua Treating Physician/Extender: Rudene Re in Treatment: 8 Education Assessment Education Provided To: Patient Education Topics Provided Hyperbaric  Oxygenation: Handouts: Other: Patient to consider restarting Monday Methods: Demonstration, Explain/Verbal Responses: State content correctly Electronic Signature(s) Signed: 06/24/2014 1:28:45 PM By: Elliot Gurney, RN, BSN, Kim RN, BSN Entered By: Elliot Gurney, RN, BSN, Kim on 06/23/2014 09:39:04 [name], [name] ([id number]) -------------------------------------------------------------------------------- Wound Assessment Details Patient Name: [name], [name] Date of Service: 06/23/2014 8:45 AM Medical Record Number: [id number] Patient Account Number: [id number] Date of Birth/Sex: 1920/05/19 (81 y.o. Male) Treating RN: Huel Coventry Primary Care Physician: Lindwood Qua Other Clinician: Referring Physician: Lindwood Qua Treating Physician/Extender: Rudene Re in Treatment: 8 Wound Status Wound Number: 1 Primary Arterial Insufficiency Ulcer Etiology: Wound Location: Left Achilles Wound Open Wounding Event: Gradually Appeared Status: Date Acquired: 01/24/2014 Comorbid Cataracts, Chronic sinus Weeks Of Treatment: 8 History: problems/congestion, Congestive Heart Clustered Wound: No Failure, Coronary Artery Disease, Pending Amputation On Presentation Hypertension, Myocardial Infarction, Peripheral Arterial Disease, Type II Diabetes, Neuropathy Photos Photo Uploaded By: Elliot Gurney, RN, BSN, Kim on 06/23/2014 09:05:05 Wound Measurements Length: (cm) 7 % Reduction i Width: (cm) 4.5 % Reduction i Depth: (cm) 0.7 Epithelializa Area: (cm) 24.74 Volume: (cm) 17.318 n Area: 0% n Volume: -133.4% tion: None Wound Description Classification: Foul Odor [name], Eleno L. ([id number]) er Cleansing: No Full Thickness With Exposed Support Structures Diabetic Severity Grade 3 (Wagner): [address] Verification: Abscess Wound Margin: Epibole Exudate Amount: Medium Exudate Type: Serous Exudate Color: amber Wound Bed Granulation Amount: Small (1-33%) Exposed Structure Granulation Quality:  Pink Fascia Exposed: No Necrotic Amount: Large (67-100%) Fat Layer Exposed: No Necrotic Quality: Eschar, Adherent Slough Tendon Exposed: Yes Muscle Exposed: No Joint Exposed: No Bone Exposed: No Periwound Skin Texture Texture Color No Abnormalities Noted: No No Abnormalities Noted: No Callus: No Atrophie Blanche: No Crepitus: No Cyanosis: No Excoriation: No Ecchymosis: No Fluctuance: No Erythema: Yes Friable: No Erythema Location: Circumferential Induration: No Erythema Change: No  Change Localized Edema: No Hemosiderin Staining: No Rash: No Mottled: No Scarring: No Pallor: No Rubor: Yes Moisture No Abnormalities Noted: No Temperature / Pain Dry / Scaly: No Temperature: No Abnormality Maceration: No Moist: Yes Wound Preparation Ulcer Cleansing: Rinsed/Irrigated with Saline Topical Anesthetic Applied: Other: lidocaine 4%, Treatment Notes Wound #1 (Left Achilles) 1. Cleansed with: Clean wound with Normal Saline 2. Anesthetic Rathe, Demaurion L. ([id number]) Topical Lidocaine 4% cream to wound bed prior to debridement 4. Dressing Applied: Santyl Ointment 5. Secondary Dressing Applied ABD and Kerlix/Conform Notes stretch netting Electronic Signature(s) Signed: 06/24/2014 1:28:45 PM By: Elliot Gurney, RN, BSN, Kim RN, BSN Entered By: Elliot Gurney, RN, BSN, Kim on 06/23/2014 08:57:11 Taboada, [name] ([id number]) -------------------------------------------------------------------------------- Wound Assessment Details Patient Name: Carrozza, Leone L. Date of Service: 06/23/2014 8:45 AM Medical Record Number: [id number] Patient Account Number: [id number] Date of Birth/Sex: January 22, 1920 (81 y.o. Male) Treating RN: Huel Coventry Primary Care Physician: Lindwood Qua Other Clinician: Referring Physician: Lindwood Qua Treating Physician/Extender: Rudene Re in Treatment: 8 Wound Status Wound Number: 2 Primary Arterial Insufficiency Ulcer Etiology: Wound Location: Left  Calcaneous - Lateral Wound Open Wounding Event: Gradually Appeared Status: Date Acquired: 01/24/2014 Comorbid Cataracts, Chronic sinus Weeks Of Treatment: 8 History: problems/congestion, Congestive Heart Clustered Wound: Yes Failure, Coronary Artery Disease, Hypertension, Myocardial Infarction, Peripheral Arterial Disease, Type II Diabetes, Neuropathy Photos Photo Uploaded By: Elliot Gurney, RN, BSN, Kim on 06/23/2014 09:05:06 Wound Measurements Length: (cm) 1 Width: (cm) 4 Depth: (cm) 0.3 Area: (cm) 3.142 Volume: (cm) 0.942 % Reduction in Area: 44.4% % Reduction in Volume: -66.7% Epithelialization: None Wound Description Full Thickness Without Foul Odor [name] Classification: Exposed Support Structures Diabetic Severity Grade 1 (Wagner): Wound Margin: Distinct, outline attached Exudate Amount: Small Exudate Type: Serous Exudate Color: amber Vater, Vidal L. ([id number]) er Cleansing: No Wound Bed Granulation Amount: Small (1-33%) Exposed Structure Granulation Quality: Pink Fascia Exposed: No Necrotic Amount: Medium (34-66%) Fat Layer Exposed: No Necrotic Quality: Adherent Slough Tendon Exposed: No Muscle Exposed: No Joint Exposed: No Bone Exposed: No Limited to Skin Breakdown Periwound Skin Texture Texture Color No Abnormalities Noted: No No Abnormalities Noted: No Callus: No Atrophie Blanche: No Crepitus: No Cyanosis: No Excoriation: No Ecchymosis: No Fluctuance: No Erythema: No Friable: No Hemosiderin Staining: No Induration: No Mottled: No Localized Edema: No Pallor: No Rash: No Rubor: No Scarring: No Temperature / Pain Moisture Temperature: No Abnormality No Abnormalities Noted: No Tenderness on Palpation: Yes Dry / Scaly: Yes Maceration: No Moist: Yes Wound Preparation Ulcer Cleansing: Rinsed/Irrigated with Saline Topical Anesthetic Applied: Other: lidocaine 4%, Treatment Notes Wound #2 (Left, Lateral Calcaneous) 1. Cleansed with: Clean  wound with Normal Saline 2. Anesthetic Topical Lidocaine 4% cream to wound bed prior to debridement 4. Dressing Applied: Aquacel Ag 5. Secondary Dressing Applied Kerlix/Conform 7. Secured with Paper tape Notes [facility], [facility] ([id number]) stretch net Electronic Signature(s) Signed: 06/24/2014 1:28:45 PM By: Elliot Gurney, RN, BSN, Kim RN, BSN Entered By: Elliot Gurney, RN, BSN, Kim on 06/23/2014 08:57:35 Dieguez, [name] ([id number]) -------------------------------------------------------------------------------- Wound Assessment Details Patient Name: Caratachea, Kharee L. Date of Service: 06/23/2014 8:45 AM Medical Record Number: [id number] Patient Account Number: [id number] Date of Birth/Sex: [birth year] (81 y.o. Male) Treating RN: Huel Coventry Primary Care Physician: Lindwood Qua Other Clinician: Referring Physician: Lindwood Qua Treating Physician/Extender: Rudene Re in Treatment: 8 Wound Status Wound Number: 3 Primary Arterial Insufficiency Ulcer Etiology: Wound Location: Left Toe Great Wound Open Wounding Event: Gradually Appeared Status: Date Acquired: 01/24/2014 Comorbid Cataracts, Chronic sinus Weeks Of Treatment: 8 History: problems/congestion, Congestive  Heart Clustered Wound: No Failure, Coronary Artery Disease, Hypertension, Myocardial Infarction, Peripheral Arterial Disease, Type II Diabetes, Neuropathy Photos Photo Uploaded By: Elliot Gurney, RN, BSN, Kim on 06/23/2014 09:05:06 Wound Measurements Length: (cm) 1 Width: (cm) 1.4 Depth: (cm) 0.1 Area: (cm) 1.1 Volume: (cm) 0.11 % Reduction in Area: -27.3% % Reduction in Volume: -27.9% Epithelialization: None Wound Description Classification: Unclassifiable Diabetic Severity [address]): Grade 0 Wound Margin: Distinct, outline attach Exudate Amount: None Present Foul Odor After Cleansing: No ed Wound Bed Granulation Amount: None Present (0%) Exposed Structure Necrotic Amount: Large (67-100%) Fascia Exposed:  No Kingry, Kyser L. ([id number]) Necrotic Quality: Eschar Fat Layer Exposed: No Tendon Exposed: No Muscle Exposed: No Joint Exposed: No Bone Exposed: No Limited to Skin Breakdown Periwound Skin Texture Texture Color No Abnormalities Noted: No No Abnormalities Noted: No Callus: No Atrophie Blanche: No Crepitus: No Cyanosis: No Excoriation: No Ecchymosis: No Fluctuance: No Erythema: No Friable: No Hemosiderin Staining: No Induration: No Mottled: No Localized Edema: No Pallor: No Rash: No Rubor: No Scarring: No Temperature / Pain Moisture Temperature: No Abnormality No Abnormalities Noted: No Dry / Scaly: Yes Maceration: No Moist: No Wound Preparation Ulcer Cleansing: Rinsed/Irrigated with Saline Topical Anesthetic Applied: Other: LIDOCAINE 4%, Treatment Notes Wound #3 (Left Toe Great) 1. Cleansed with: Clean wound with Normal Saline 2. Anesthetic Topical Lidocaine 4% cream to wound bed prior to debridement 4. Dressing Applied: Aquacel Ag 5. Secondary Dressing Applied Kerlix/Conform 7. Secured with Paper tape Notes stretch net Electronic Signature(s) Signed: 06/24/2014 1:28:45 PM By: Elliot Gurney, RN, BSN, Kim RN, BSN [address], [place] [name] ([id number]) Entered By: Elliot Gurney RN, BSN, Kim on 06/23/2014 08:57:48 Nishiyama, [name] ([id number]) -------------------------------------------------------------------------------- Vitals Details Patient Name: Jaco, Yuvaan L. Date of Service: 06/23/2014 8:45 AM Medical Record Number: [id number] Patient Account Number: [id number] Date of Birth/Sex: 1920/12/14 (81 y.o. Male) Treating RN: Huel Coventry Primary Care Physician: Lindwood Qua Other Clinician: Referring Physician: Lindwood Qua Treating Physician/Extender: Rudene Re in Treatment: 8 Vital Signs Time Taken: 08:46 Temperature (F): 98.0 Height (in): 70 Pulse (bpm): 87 Weight (lbs): 200 Respiratory Rate (breaths/min): 18 Body Mass Index (BMI): 28.7 Blood  Pressure (mmHg): 124/48 Reference Range: 80 - 120 mg / dl Electronic Signature(s) Signed: 06/24/2014 1:28:45 PM By: Elliot Gurney, RN, BSN, Kim RN, BSN Entered By: Elliot Gurney, RN, BSN, Kim on 06/23/2014 08:46:32

## 2014-06-27 ENCOUNTER — Encounter: Payer: Medicare Other | Admitting: Surgery

## 2014-06-27 DIAGNOSIS — L97323 Non-pressure chronic ulcer of left ankle with necrosis of muscle: Secondary | ICD-10-CM | POA: Diagnosis not present

## 2014-06-27 LAB — GLUCOSE, CAPILLARY: GLUCOSE-CAPILLARY: 169 mg/dL — AB (ref 65–99)

## 2014-06-27 NOTE — Progress Notes (Signed)
[name], [name] ([id number]) Visit Report for 06/27/2014 Arrival Information Details Patient Name: [name], Akito L. Date of Service: 06/27/2014 10:00 AM Medical Record Number: [id number] Patient Account Number: [id number] Date of Birth/Sex: 1920/06/11 [age] y.o. Male) Treating RN: Primary Care Physician: [name], [name] Other Clinician: Izetta DakinWallace, Sallie Referring Physician: [name], [name] Treating Physician/Extender: Rudene ReBritto, Errol Weeks in Treatment: 8 Visit Information History Since Last Visit Added or deleted any medications: No Patient Arrived: Wheel Chair Any new allergies or adverse reactions: No Arrival Time: 09:25 Had a fall or experienced change in No activities of daily living that may affect Accompanied By: Reita ClicheBobby, risk of falls: son Signs or symptoms of abuse/neglect No Transfer Assistance: Manual since last visito Patient Identification Verified: Yes Hospitalized since last visit: No Secondary Verification Process Yes Has Dressing in Place as Prescribed: Yes Completed: Has Footwear/Offloading in Place as Yes Patient Requires Transmission-Based No Prescribed: Precautions: Right: Surgical Shoe with Patient Has Alerts: No Pressure Relief Insole Pain Present Now: No Electronic Signature(s) Signed: 06/27/2014 1:37:18 PM By: Dayton MartesWallace, RCP,RRT,CHT, Sallie RCP, RRT, CHT Entered By: Dayton MartesWallace, RCP,RRT,CHT, Sallie on 06/27/2014 09:33:24 [name], [name] ([id number]) -------------------------------------------------------------------------------- Encounter Discharge Information Details Patient Name: [name], [name] Date of Service: 06/27/2014 10:00 AM Medical Record Number: [id number] Patient Account Number: [id number] Date of Birth/Sex: 1920/06/11 [age] y.o. Male) Treating RN: Primary Care Physician: [name], [name] Other Clinician: Izetta DakinWallace, Sallie Referring Physician: [name], [name] Treating Physician/Extender: Rudene ReBritto, Errol Weeks in Treatment: 8 Encounter Discharge Information  Items Discharge Pain Level: 0 Discharge Condition: Stable Ambulatory Status: Wheelchair Discharge Destination: Home Private Transportation: Auto Accompanied By: Reita ClicheBobby, son Schedule Follow-up Appointment: No Medication Reconciliation completed and No provided to Patient/Care Mateya Torti: Clinical Summary of Care: Notes Patient has an HBO treatment scheduled on 06/28/14 at 10:00 am. Electronic Signature(s) Signed: 06/27/2014 1:37:18 PM By: Dayton MartesWallace, RCP,RRT,CHT, Sallie RCP, RRT, CHT Entered By: Dayton MartesWallace, RCP,RRT,CHT, Sallie on 06/27/2014 12:51:20 [name], [name] ([id number]) -------------------------------------------------------------------------------- Vitals Details Patient Name: [name], [name] Date of Service: 06/27/2014 10:00 AM Medical Record Number: [id number] Patient Account Number: [id number] Date of Birth/Sex: 1920/06/11 [age] y.o. Male) Treating RN: Primary Care Physician: [name], [name] Other Clinician: Izetta DakinWallace, Sallie Referring Physician: [name], [name] Treating Physician/Extender: Rudene ReBritto, Errol Weeks in Treatment: 8 Vital Signs Time Taken: 09:26 Temperature (F): 97.6 Height (in): 70 Pulse (bpm): 78 Weight (lbs): 200 Respiratory Rate (breaths/min): 18 Body Mass Index (BMI): 28.7 Blood Pressure (mmHg): 100/54 Capillary Blood Glucose (mg/dl): 742277 Reference Range: 80 - 120 mg / dl Electronic Signature(s) Signed: 06/27/2014 1:37:18 PM By: Dayton MartesWallace, RCP,RRT,CHT, Sallie RCP, RRT, CHT Entered By: Dayton MartesWallace, RCP,RRT,CHT, Sallie on 06/27/2014 09:34:13

## 2014-06-27 NOTE — Progress Notes (Signed)
[name], [name] ([id number]) Visit Report for 06/27/2014 HBO Details Patient Name: [name], [name] Date of Service: 06/27/2014 10:00 AM Medical Record Number: [id number] Patient Account Number: [id number] Date of Birth/Sex: 1920-04-29 [age] y.o. 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 HBO Treatment Details Treatment Number: 14 Patient Type: Outpatient Chamber Type: Monoplace Chamber #: [id number] Treatment Protocol: 2.0 ATA with 90 minutes oxygen, and no air breaks Treatment Details Compression Rate Down: 1.5 psi / minute De-Compression Rate Up: 1.5 psi / minute Air breaks and breathing Compress Tx Pressure Decompress Decompress periods Begins Reached Begins Ends (leave unused spaces blank) Chamber Pressure 1 ATA 2.0 ATA - - - - - - 2.0 ATA 1 ATA Clock Time (24 hr) 10:13 10:25 - - - - - - 11:55 12:05 Treatment Length: 112 (minutes) Treatment Segments: 4 Capillary Blood Glucose Pre Capillary Blood Glucose (mg/dl): Post Capillary Blood Glucose (mg/dl): Vital Signs Capillary Blood Glucose Reference Range: 80 - 120 mg / dl HBO Diabetic Blood Glucose Intervention Range: <131 mg/dl or >865>249 mg/dl Time Vitals Blood Respiratory Capillary Blood Glucose Pulse Action Type: Pulse: Temperature: Taken: Pressure: Rate: Glucose (mg/dl): Meter #: Oximetry (%) Taken: Pre 09:26 100/54 78 18 97.6 277 1 none Post 12:09 122/66 78 18 98.4 169 1 none Treatment Response Well [name], [name] ([id number]) Treatment Toleration: Treatment Treatment Completed without Adverse  Event Completion Status: Electronic Signature(s) Signed: 06/27/2014 1:37:18 PM By: Dayton MartesWallace, RCP,RRT,CHT, Sallie RCP, RRT, CHT Signed: 06/27/2014 4:42:08 PM By: Evlyn KannerBritto, Errol MD, FACS Previous Signature: 06/27/2014 12:34:34 PM Version By: Evlyn KannerBritto, Errol MD, FACS Entered By: Dayton MartesWallace, RCP,RRT,CHT, Sallie on 06/27/2014 12:50:32 [name], [name] ([id number]) -------------------------------------------------------------------------------- HBO Safety Checklist Details Patient Name: [name], [name] Date of Service: 06/27/2014 10:00 AM Medical Record Number: [id number] Patient Account Number: [id number] Date of Birth/Sex: 1920-04-29 [age] y.o. Male) Treating RN: Primary Care Physician: [name], [name] Other Clinician: Izetta DakinWallace, Sallie Referring Physician: [name], [name] Treating Physician/Extender: Rudene ReBritto, Errol Weeks in Treatment: 8 HBO Safety Checklist Items Safety Checklist Consent Form Signed Patient voided / foley secured and emptied When did you last eato 07:30 am Last dose of injectable or oral agent 07:30 am Ostomy pouch emptied and vented if applicable n/a All implantable devices assessed, documented and approved pacemaker Intravenous access site secured and place PICC line left arm Valuables secured Linens and cotton and cotton/polyester blend (less than 51% polyester) Personal oil-based products / skin lotions / body lotions removed Wigs or hairpieces removed Smoking or tobacco materials removed Books / newspapers / magazines / loose paper removed Cologne, aftershave, perfume and deodorant removed Jewelry removed (may wrap wedding band) Make-up removed n/a Hair care products removed Battery operated devices (external) removed Heating patches and chemical warmers removed Titanium eyewear removed Nail polish cured greater than 10 hours n/a Casting material cured greater than 10 hours n/a Hearing aids removed Loose dentures or partials removed Prosthetics have been removed  n/a Patient demonstrates correct use of air break device (if applicable) Patient concerns have been addressed Patient grounding bracelet on and cord attached to chamber Specifics for Inpatients (complete in addition to above) Medication sheet sent with patient Intravenous medications needed or due during therapy sent with patient [name], [name] ([id number]) Drainage tubes (  e.g. nasogastric tube or chest tube secured and vented) Endotracheal or Tracheotomy tube secured Cuff deflated of air and inflated with saline Airway suctioned Electronic Signature(s) Signed: 06/27/2014 1:37:18 PM By: Dayton Martes RCP, RRT, CHT Entered By: Dayton Martes on 06/27/2014 09:39:30

## 2014-06-28 ENCOUNTER — Encounter: Payer: Medicare Other | Admitting: Surgery

## 2014-06-28 DIAGNOSIS — L97323 Non-pressure chronic ulcer of left ankle with necrosis of muscle: Secondary | ICD-10-CM | POA: Diagnosis not present

## 2014-06-28 LAB — GLUCOSE, CAPILLARY
Glucose-Capillary: 238 mg/dL — ABNORMAL HIGH (ref 65–99)
Glucose-Capillary: 105 mg/dL — ABNORMAL HIGH (ref 65–99)

## 2014-06-28 NOTE — Progress Notes (Signed)
VANDY, TSUCHIYA ([id number]) Visit Report for 06/28/2014 HBO Details Patient Name: [name], [name] Date of Service: 06/28/2014 10:00 AM Medical Record Number: [id number] Patient Account Number: [id number] Date of Birth/Sex: 06-Dec-1920 (81 y.o. 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 HBO Treatment Details Treatment Number: 15 Patient Type: Outpatient Chamber Type: Monoplace Chamber #: [id number] Treatment Protocol: 2.0 ATA with 90 minutes oxygen, and no air breaks Treatment Details Compression Rate Down: 1.5 psi / minute De-Compression Rate Up: 1.5 psi / minute Air breaks and breathing Compress Tx Pressure Decompress Decompress periods Begins Reached Begins Ends (leave unused spaces blank) Chamber Pressure 1 ATA 2.0 ATA - - - - - - 2.0 ATA 1 ATA Clock Time (24 hr) 10:31 10:43 - - - - - - 12:13 12:23 Treatment Length: 112 (minutes) Treatment Segments: 4 Capillary Blood Glucose Pre Capillary Blood Glucose (mg/dl): Post Capillary Blood Glucose (mg/dl): Vital Signs Capillary Blood Glucose Reference Range: 80 - 120 mg / dl HBO Diabetic Blood Glucose Intervention Range: <131 mg/dl or >865 mg/dl Time Vitals Blood Respiratory Capillary Blood Glucose Pulse Action Type: Pulse: Temperature: Taken: Pressure: Rate: Glucose (mg/dl): Meter #: Oximetry (%) Taken: Pre 09:26 102/52 84 18 98.2 238 1 none Post 12:34 105/62 78 18 98.1 105 1 none Treatment Response Well [name], [name] ([id number]) Treatment Toleration: Treatment Treatment Completed without Adverse  Event Completion Status: Electronic Signature(s) Signed: 06/28/2014 3:34:49 PM By: Evlyn Kanner MD, FACS Signed: 06/28/2014 3:43:15 PM By: Dayton Martes RCP, RRT, CHT Previous Signature: 06/28/2014 12:36:02 PM Version By: Evlyn Kanner MD, FACS Entered By: Dayton Martes on 06/28/2014 12:39:36 [name] ([id number]) -------------------------------------------------------------------------------- HBO Safety Checklist Details Patient Name: [name], [name] Date of Service: 06/28/2014 10:00 AM Medical Record Number: [id number] Patient Account Number: [id number] Date of Birth/Sex: August 02, 1920 (81 y.o. Male) Treating RN: Primary Care Physician: Lindwood Qua Other Clinician: Izetta Dakin Referring Physician: Lindwood Qua Treating Physician/Extender: Rudene Re in Treatment: 8 HBO Safety Checklist Items Safety Checklist Consent Form Signed Patient voided / foley secured and emptied When did you last eato 07:30 am Last dose of injectable or oral agent 07:30 am Ostomy pouch emptied and vented if applicable n//a All implantable devices assessed, documented and approved Pacemaker Intravenous access site secured and place PICC line in left arm Valuables secured Linens and cotton and cotton/polyester blend (less than 51% polyester) Personal oil-based products / skin lotions / body lotions removed Wigs or hairpieces removed Smoking or tobacco materials removed Books / newspapers / magazines / loose paper removed Cologne, aftershave, perfume and deodorant removed Jewelry removed (may wrap wedding band) Make-up removed n/a Hair care products removed Battery operated devices (external) removed Heating patches and chemical warmers removed Titanium eyewear removed Nail polish cured greater than 10 hours n/a Casting material cured greater than 10 hours n/a Hearing aids removed Loose dentures or partials removed Prosthetics have been removed  n/a Patient demonstrates correct use of air break device (if applicable) Patient concerns have been addressed Patient grounding bracelet on and cord attached to chamber Specifics for Inpatients (complete in addition to above) Medication sheet sent with patient Intravenous medications needed or due during therapy sent with patient [name], [name]. ([id number]) Drainage  tubes (e.g. nasogastric tube or chest tube secured and vented) Endotracheal or Tracheotomy tube secured Cuff deflated of air and inflated with saline Airway suctioned Electronic Signature(s) Signed: 06/28/2014 3:43:15 PM By: Dayton MartesWallace, RCP,RRT,CHT, Sallie RCP, RRT, CHT Entered By: Dayton MartesWallace, RCP,RRT,CHT, Sallie on 06/28/2014 09:33:43

## 2014-06-28 NOTE — Progress Notes (Signed)
[name], Keywon L. ([id number]) Visit Report for 06/28/2014 Arrival Information Details Patient Name: [name], [name] Date of Service: 06/28/2014 10:00 AM Medical Record Number: [id number] Patient Account Number: [id number] Date of Birth/Sex: 1920-09-06 [age] y.o. Male) Treating RN: Primary Care Physician: [name], [name] Other Clinician: Izetta DakinWallace, Sallie Referring Physician: [name], [name] Treating Physician/Extender: Rudene ReBritto, Errol Weeks in Treatment: 8 Visit Information History Since Last Visit Added or deleted any medications: No Patient Arrived: Wheel Chair Any new allergies or adverse reactions: No Arrival Time: 09:24 Had a fall or experienced change in No activities of daily living that may affect Accompanied By: Reita ClicheBobby, risk of falls: son Signs or symptoms of abuse/neglect No Transfer Assistance: Manual since last visito Patient Identification Verified: Yes Hospitalized since last visit: No Secondary Verification Process Yes Has Dressing in Place as Prescribed: Yes Completed: Has Footwear/Offloading in Place as Yes Patient Requires Transmission-Based No Prescribed: Precautions: Left: Surgical Shoe with Patient Has Alerts: No Pressure Relief Insole Pain Present Now: No Electronic Signature(s) Signed: 06/28/2014 3:43:15 PM By: Dayton MartesWallace, RCP,RRT,CHT, Sallie RCP, RRT, CHT Entered By: Dayton MartesWallace, RCP,RRT,CHT, Sallie on 06/28/2014 09:31:59 [name], [name] ([id number]) -------------------------------------------------------------------------------- Encounter Discharge Information Details Patient Name: [name], [name] Date of Service: 06/28/2014 10:00 AM Medical Record Number: [id number] Patient Account Number: [id number] Date of Birth/Sex: 1920-09-06 [age] y.o. Male) Treating RN: Primary Care Physician: [name], [name] Other Clinician: Izetta DakinWallace, Sallie Referring Physician: [name], [name] Treating Physician/Extender: Rudene ReBritto, Errol Weeks in Treatment: 8 Encounter Discharge Information  Items Discharge Pain Level: 0 Discharge Condition: Stable Ambulatory Status: Wheelchair Discharge Destination: Home Private Transportation: Auto Accompanied By: Reita ClicheBobby, son Schedule Follow-up Appointment: No Medication Reconciliation completed and No provided to Patient/Care Javares Kaufhold: Clinical Summary of Care: Notes Patient has an HBO treatment scheduled on 06/29/14 at 10:00 am. Electronic Signature(s) Signed: 06/28/2014 3:43:15 PM By: Dayton MartesWallace, RCP,RRT,CHT, Sallie RCP, RRT, CHT Entered By: Dayton MartesWallace, RCP,RRT,CHT, Sallie on 06/28/2014 12:40:50 [name], [name] ([id number]) -------------------------------------------------------------------------------- Vitals Details Patient Name: [name], [name] Date of Service: 06/28/2014 10:00 AM Medical Record Number: [id number] Patient Account Number: [id number] Date of Birth/Sex: 1920-09-06 [age] y.o. Male) Treating RN: Primary Care Physician: [name], [name] Other Clinician: Izetta DakinWallace, Sallie Referring Physician: [name], [name] Treating Physician/Extender: Rudene ReBritto, Errol Weeks in Treatment: 8 Vital Signs Time Taken: 09:26 Temperature (F): 98.2 Height (in): 70 Pulse (bpm): 84 Weight (lbs): 200 Respiratory Rate (breaths/min): 18 Body Mass Index (BMI): 28.7 Blood Pressure (mmHg): 102/52 Capillary Blood Glucose (mg/dl): 756238 Reference Range: 80 - 120 mg / dl Electronic Signature(s) Signed: 06/28/2014 3:43:15 PM By: Dayton MartesWallace, RCP,RRT,CHT, Sallie RCP, RRT, CHT Entered By: Dayton MartesWallace, RCP,RRT,CHT, Sallie on 06/28/2014 09:32:26

## 2014-06-29 ENCOUNTER — Encounter: Payer: Medicare Other | Admitting: Surgery

## 2014-06-29 ENCOUNTER — Encounter: Payer: Self-pay | Admitting: Cardiology

## 2014-06-29 DIAGNOSIS — L97323 Non-pressure chronic ulcer of left ankle with necrosis of muscle: Secondary | ICD-10-CM | POA: Diagnosis not present

## 2014-06-29 LAB — GLUCOSE, CAPILLARY
Glucose-Capillary: 209 mg/dL — ABNORMAL HIGH (ref 65–99)
Glucose-Capillary: 108 mg/dL — ABNORMAL HIGH (ref 65–99)

## 2014-06-29 NOTE — Progress Notes (Signed)
[name], [name] ([id number]) Visit Report for 06/29/2014 Arrival Information Details Patient Name: [name], [name] Date of Service: 06/29/2014 10:00 AM Medical Record Number: [id number] Patient Account Number: [id number] Date of Birth/Sex: [birth year] [age] y.o. Male) Treating RN: Primary Care Physician: [name], [name] Other Clinician: Izetta DakinWallace, Sallie Referring Physician: [name], [name] Treating Physician/Extender: BURNS III, Regis BillWALTER Weeks in Treatment: 8 Visit Information History Since Last Visit Added or deleted any medications: No Patient Arrived: Wheel Chair Any new allergies or adverse reactions: No Arrival Time: 09:30 Had a fall or experienced change in No activities of daily living that may affect Accompanied By: Reita ClicheBobby, risk of falls: son Signs or symptoms of abuse/neglect No Transfer Assistance: Nurse, adultHoyer Lift since last visito Patient Identification Verified: Yes Hospitalized since last visit: No Secondary Verification Process Yes Has Dressing in Place as Prescribed: Yes Completed: Has Footwear/Offloading in Place as Yes Patient Requires Transmission-Based No Prescribed: Precautions: Left: Surgical Shoe with Patient Has Alerts: No Pressure Relief Insole Pain Present Now: No Electronic Signature(s) Signed: 06/29/2014 12:39:47 PM By: Dayton MartesWallace, RCP,RRT,CHT, Sallie RCP, RRT, CHT Entered By: Dayton MartesWallace, RCP,RRT,CHT, Sallie on 06/29/2014 09:45:31 [name], [name] ([id number]) -------------------------------------------------------------------------------- Encounter Discharge Information Details Patient Name: [name], [name] Date of Service: 06/29/2014 10:00 AM Medical Record Number: [id number] Patient Account Number: [id number] Date of Birth/Sex: [birth year] [age] y.o. Male) Treating RN: Primary Care Physician: [name], [name] Other Clinician: Izetta DakinWallace, Sallie Referring Physician: [name], [name] Treating Physician/Extender: BURNS III, Regis BillWALTER Weeks in Treatment: 8 Encounter Discharge  Information Items Discharge Pain Level: 0 Discharge Condition: Stable Ambulatory Status: Wheelchair Discharge Destination: Home Private Transportation: Auto Accompanied By: Reita ClicheBobby, son Schedule Follow-up Appointment: No Medication Reconciliation completed and No provided to Patient/Care Janathan Bribiesca: Clinical Summary of Care: Notes Patient has an HBO treatment scheduled on 06/30/14 at 10:00 am. Electronic Signature(s) Signed: 06/29/2014 12:39:47 PM By: Dayton MartesWallace, RCP,RRT,CHT, Sallie RCP, RRT, CHT Entered By: Dayton MartesWallace, RCP,RRT,CHT, Sallie on 06/29/2014 12:39:24 [name], [name] ([id number]) -------------------------------------------------------------------------------- Vitals Details Patient Name: [name], [name] Date of Service: 06/29/2014 10:00 AM Medical Record Number: [id number] Patient Account Number: [id number] Date of Birth/Sex: [birth year] [age] y.o. Male) Treating RN: Primary Care Physician: [name], [name] Other Clinician: Izetta DakinWallace, Sallie Referring Physician: [name], [name] Treating Physician/Extender: BURNS III, WALTER Weeks in Treatment: 8 Vital Signs Time Taken: 09:31 Temperature (F): 97.8 Height (in): 70 Pulse (bpm): 72 Weight (lbs): 200 Respiratory Rate (breaths/min): 18 Body Mass Index (BMI): 28.7 Blood Pressure (mmHg): 112/50 Capillary Blood Glucose (mg/dl): 536209 Reference Range: 80 - 120 mg / dl Electronic Signature(s) Signed: 06/29/2014 12:39:47 PM By: Dayton MartesWallace, RCP,RRT,CHT, Sallie RCP, RRT, CHT Entered By: Dayton MartesWallace, RCP,RRT,CHT, Sallie on 06/29/2014 09:45:58

## 2014-06-29 NOTE — Progress Notes (Signed)
[name], Brylee L. ([id number]) Visit Report for 06/29/2014 HBO Details Patient Name: [name], [name] Date of Service: 06/29/2014 10:00 AM Medical Record Number: [id number] Patient Account Number: [id number] Date of Birth/Sex: 09-Aug-1920 77(81 y.o. 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 HBO Treatment Details Treatment Number: 16 Patient Type: Outpatient Chamber Type: Monoplace Chamber #: [id number] Treatment Protocol: 2.0 ATA with 90 minutes oxygen, and no air breaks Treatment Details Compression Rate Down: 1.5 psi / minute De-Compression Rate Up: 1.5 psi / minute Air breaks and breathing Compress Tx Pressure Decompress Decompress periods Begins Reached Begins Ends (leave unused spaces blank) Chamber Pressure 1 ATA 2.0 ATA - - - - - - 2.0 ATA 1 ATA Clock Time (24 hr) 10:25 10:37 - - - - - - 12:08 12:18 Treatment Length: 113 (minutes) Treatment Segments: 4 Capillary Blood Glucose Pre Capillary Blood Glucose (mg/dl): Post Capillary Blood Glucose (mg/dl): Vital Signs Capillary Blood Glucose Reference Range: 80 - 120 mg / dl HBO Diabetic Blood Glucose Intervention Range: <131 mg/dl or >664>249 mg/dl Time Vitals Blood Respiratory Capillary Blood Glucose Pulse Action Type: Pulse: Temperature: Taken: Pressure: Rate: Glucose (mg/dl): Meter #: Oximetry (%) Taken: Pre 09:31 112/50 72 18 97.8 209 1 none Post 12:29 110/68 78 18 98.4 108 1 none Treatment Response Well [name], [name] ([id number]) Treatment Toleration: Treatment Treatment Completed without Adverse  Event Completion Status: HBO Attestation I certify that I supervised this HBO treatment in accordance with Medicare guidelines. A trained Yes emergency response team is readily available per hospital policies and procedures. Continue HBOT as ordered. Yes Electronic Signature(s) Signed: 06/29/2014 3:19:31 PM By: Madelaine BhatBurns, III, Walter MD Previous Signature: 06/29/2014 12:39:47 PM Version By: Dayton MartesWallace, RCP,RRT,CHT, Sallie RCP, RRT, CHT Entered By: Madelaine BhatBurns, III, Walter on 06/29/2014 14:12:15 [name], Raoul [name] ([id number]) -------------------------------------------------------------------------------- HBO Safety Checklist Details Patient Name: [name], [name] Date of Service: 06/29/2014 10:00 AM Medical Record Number: [id number] Patient Account Number: [id number] Date of Birth/Sex: 09-Aug-1920 [age] y.o. Male) Treating RN: Primary Care Physician: [name], [name] Other Clinician: Izetta DakinWallace, Sallie Referring Physician: [name], [name] Treating Physician/Extender: BURNS III, WALTER Weeks in Treatment: 8 HBO Safety Checklist Items Safety Checklist Consent Form Signed Patient voided / foley secured and emptied When did you last eato 07:30 am Last dose of injectable or oral agent 07:30 am Ostomy pouch emptied and vented if applicable n/a All implantable devices assessed, documented and approved Pacemaker Intravenous access site secured and place PICC line left arm Valuables secured Linens and cotton and cotton/polyester blend (less than 51% polyester) Personal oil-based products / skin lotions / body lotions removed Wigs or hairpieces removed Smoking or tobacco materials removed Books / newspapers / magazines / loose paper removed Cologne, aftershave, perfume and deodorant removed Jewelry removed (may wrap wedding band) Make-up removed n/a Hair care products removed Battery operated devices (external) removed Heating patches and chemical warmers removed Titanium eyewear removed Nail polish cured  greater than 10 hours n/a Casting material cured greater than 10 hours n/a Hearing aids removed Loose dentures or partials removed Prosthetics have been removed n/a Patient demonstrates correct use of air break device (if applicable) Patient concerns have been addressed Patient grounding bracelet on and cord attached to chamber Specifics for  Inpatients (complete in addition to above) Medication sheet sent with patient Intravenous medications needed or due during therapy sent with patient [name], [name] ([id number]) Drainage tubes (e.g. nasogastric tube or chest tube secured and vented) Endotracheal or Tracheotomy tube secured Cuff deflated of air and inflated with saline Airway suctioned Electronic Signature(s) Signed: 06/29/2014 12:39:47 PM By: Dayton Martes RCP, RRT, CHT Entered By: Dayton Martes on 06/29/2014 09:47:11

## 2014-06-30 ENCOUNTER — Encounter: Payer: Medicare Other | Admitting: Surgery

## 2014-06-30 DIAGNOSIS — L97323 Non-pressure chronic ulcer of left ankle with necrosis of muscle: Secondary | ICD-10-CM | POA: Diagnosis not present

## 2014-06-30 LAB — GLUCOSE, CAPILLARY: Glucose-Capillary: 171 mg/dL — ABNORMAL HIGH (ref 65–99)

## 2014-06-30 NOTE — Progress Notes (Signed)
DAIVIK, OVERLEY ([id number]) Visit Report for 04/28/2014 Allergy List Details Patient Name: Nickless, Beryle L. Date of Service: 04/28/2014 9:00 AM Medical Record Number: [id number] Patient Account Number: [id number] Date of Birth/Sex: 30-Sep-1920 (81 y.o. Male) Treating RN: Kristin Bruins Primary Care Physician: Lindwood Qua Other Clinician: Referring Physician: Lindwood Qua Treating Physician/Extender: Rudene Re in Treatment: 0 Allergies Active Allergies ACE Inhibitors Reaction: chest pain Severity: Severe Allergy Notes Electronic Signature(s) Signed: 04/28/2014 3:29:45 PM By: Kristin Bruins Entered By: Kristin Bruins on 04/28/2014 09:23:47 Schweiss, Jahaad Elbert Ewings ([id number]) -------------------------------------------------------------------------------- Arrival Information Details Patient Name: Jutras, Iwao L. Date of Service: 04/28/2014 9:00 AM Medical Record Number: [id number] Patient Account Number: [id number] Date of Birth/Sex: 03/24/1920 (81 y.o. Male) Treating RN: Kristin Bruins Primary Care Physician: Lindwood Qua Other Clinician: Referring Physician: Lindwood Qua Treating Physician/Extender: Rudene Re in Treatment: 0 Visit Information Patient Arrived: Dan Humphreys Arrival Time: 09:19 Accompanied By: son Transfer Assistance: Manual Patient Identification Verified: Yes Secondary Verification Process Yes Completed: Patient Requires Transmission-Based No Precautions: Patient Has Alerts: Yes Patient Alerts: 04/28/2014 ABI: (L) 0.91 (R) 0.71 Electronic Signature(s) Signed: 06/29/2014 2:59:24 PM By: Elliot Gurney RN, BSN, Kim RN, BSN Previous Signature: 04/28/2014 3:29:45 PM Version By: Kristin Bruins Entered By: Elliot Gurney RN, BSN, Kim on 06/29/2014 14:59:24 Simonian, [name] ([id number]) -------------------------------------------------------------------------------- Clinic Level of Care Assessment Details Patient Name: Fitterer, Dywane L. Date of Service: 04/28/2014  9:00 AM Medical Record Number: [id number] Patient Account Number: [id number] Date of Birth/Sex: [birth year] (81 y.o. Male) Treating RN: Curtis Sites Primary Care Physician: Lindwood Qua Other Clinician: Referring Physician: Lindwood Qua Treating Physician/Extender: Rudene Re in Treatment: 0 Clinic Level of Care Assessment Items TOOL 1 Quantity Score []  - Use when EandM and Procedure is performed on INITIAL visit 0 ASSESSMENTS - Nursing Assessment / Reassessment X - General Physical Exam (combine w/ comprehensive assessment (listed just 1 20 below) when performed on new pt. evals) X - Comprehensive Assessment (HX, ROS, Risk Assessments, Wounds Hx, etc.) 1 25 ASSESSMENTS - Wound and Skin Assessment / Reassessment []  - Dermatologic / Skin Assessment (not related to wound area) 0 ASSESSMENTS - Ostomy and/or Continence Assessment and Care []  - Incontinence Assessment and Management 0 []  - Ostomy Care Assessment and Management (repouching, etc.) 0 PROCESS - Coordination of Care X - Simple Patient / Family Education for ongoing care 1 15 []  - Complex (extensive) Patient / Family Education for ongoing care 0 X - Staff obtains Chiropractor, Records, Test Results / Process Orders 1 10 []  - Staff telephones HHA, Nursing Homes / Clarify orders / etc 0 []  - Routine Transfer to another Facility (non-emergent condition) 0 []  - Routine Hospital Admission (non-emergent condition) 0 X - New Admissions / Manufacturing engineer / Ordering NPWT, Apligraf, etc. 1 15 []  - Emergency Hospital Admission (emergent condition) 0 PROCESS - Special Needs []  - Pediatric / Minor Patient Management 0 []  - Isolation Patient Management 0 Bean, Iden L. ([id number]) []  - Hearing / Language / Visual special needs 0 []  - Assessment of Community assistance (transportation, D/C planning, etc.) 0 []  - Additional assistance / Altered mentation 0 []  - Support Surface(s) Assessment (bed, cushion, seat, etc.)  0 INTERVENTIONS - Miscellaneous []  - External ear exam 0 []  - Patient Transfer (multiple staff / Nurse, adult / Similar devices) 0 []  - Simple Staple / Suture removal (25 or less) 0 []  - Complex Staple / Suture removal (26 or more) 0 []  - Hypo/Hyperglycemic Management (do not check if billed separately) 0 X - Ankle /  Brachial Index (ABI) - do not check if billed separately 1 15 Has the patient been seen at the hospital within the last three years: Yes Total Score: 100 Level Of Care: New/Established - Level 3 Electronic Signature(s) Signed: 04/28/2014 4:55:41 PM By: Curtis Sites Entered By: Curtis Sites on 04/28/2014 10:59:20 Kulkarni, [name] ([id number]) -------------------------------------------------------------------------------- Encounter Discharge Information Details Patient Name: Murrillo, Wayne L. Date of Service: 04/28/2014 9:00 AM Medical Record Number: [id number] Patient Account Number: [id number] Date of Birth/Sex: [birth year] (81 y.o. Male) Treating RN: Kristin Bruins Primary Care Physician: Lindwood Qua Other Clinician: Referring Physician: Lindwood Qua Treating Physician/Extender: Rudene Re in Treatment: 0 Encounter Discharge Information Items Discharge Pain Level: 0 Discharge Condition: Stable Ambulatory Status: Wheelchair Discharge Destination: Home Transportation: Private Auto Accompanied By: son Schedule Follow-up Appointment: Yes Medication Reconciliation completed No and provided to Patient/Care Rhilyn Battle: Provided on Clinical Summary of Care: 04/28/2014 Form Type Recipient Paper Patient AE Electronic Signature(s) Signed: 04/28/2014 11:04:32 AM By: Gwenlyn Perking Entered By: Gwenlyn Perking on 04/28/2014 11:04:32 Meader, Abdoulie Elbert Ewings ([id number]) -------------------------------------------------------------------------------- Lower Extremity Assessment Details Patient Name: Catalina, Josph L. Date of Service: 04/28/2014 9:00 AM Medical Record Number:  [id number] Patient Account Number: [id number] Date of Birth/Sex: 1920/03/08 (81 y.o. Male) Treating RN: Curtis Sites Primary Care Physician: Lindwood Qua Other Clinician: Referring Physician: Lindwood Qua Treating Physician/Extender: Rudene Re in Treatment: 0 Edema Assessment Assessed: [Left: No] [Right: No] E[Left: dema] [Right: :] Calf Left: Right: Point of Measurement: 35 cm From Medial Instep 38.7 cm 39.6 cm Ankle Left: Right: Point of Measurement: 12 cm From Medial Instep 25.7 cm 24.6 cm Vascular Assessment Pulses: Posterior Tibial Palpable: [Left:No] [Right:No] Doppler: [Left:Multiphasic] [Right:Multiphasic] Dorsalis Pedis Palpable: [Left:No] [Right:No] Doppler: [Left:Monophasic] [Right:Monophasic] Extremity colors, hair growth, and conditions: Extremity Color: [Left:Normal] [Right:Normal] Hair Growth on Extremity: [Left:Yes] [Right:Yes] Temperature of Extremity: [Left:Cool] [Right:Cool] Capillary Refill: [Left:< 3 seconds] [Right:< 3 seconds] Blood Pressure: Brachial: [Left:108] [Right:112] Dorsalis Pedis: 102 [Left:Dorsalis Pedis: 80] Ankle: Posterior Tibial: [Left:Posterior Tibial: 0.91] [Right:0.71] Toe Nail Assessment Left: Right: Thick: Yes Yes Discolored: Yes Yes Deformed: No No Improper Length and Hygiene: No No TAIVEN, GREENLEY ([id number]) Electronic Signature(s) Signed: 04/28/2014 10:03:54 AM By: Curtis Sites Entered By: Curtis Sites on 04/28/2014 10:03:54 Fooks, Quayshawn Elbert Ewings ([id number]) -------------------------------------------------------------------------------- Multi Wound Chart Details Patient Name: Amster, Kuron L. Date of Service: 04/28/2014 9:00 AM Medical Record Number: [id number] Patient Account Number: [id number] Date of Birth/Sex: Jun 30, 1920 (81 y.o. 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] Date Acquired: [1:01/24/2014] [2:01/24/2014] [3:01/24/2014] Weeks of Treatment: [1:0] [2:0] [3:0] Wound Status: [1:Open] [2:Open] [3:Open] Clustered Wound: [1:No] [2:Yes] [3:No] Pending Amputation on Yes [2:No] [3:No] Presentation: Measurements L x W x D 6.7x4.7x0.3 [2:1.8x4x0.1] [3:1.1x1x0.1] (cm) Area (cm) : [1:24.732] [2:5.655] [3:0.864] Volume (cm) : [1:7.42] [2:0.565] [3:0.086] Periwound Skin Texture: No Abnormalities Noted No Abnormalities Noted No Abnormalities Noted Periwound Skin [1:No Abnormalities Noted No Abnormalities Noted No Abnormalities Noted] Moisture: Periwound Skin Color: No Abnormalities Noted No Abnormalities Noted No Abnormalities Noted Tenderness on [1:No] [2:No] [3:No] Treatment Notes Electronic Signature(s) Signed: 04/28/2014 3:29:45 PM By: Kristin Bruins  Entered By: Kristin Bruins on 04/28/2014 10:29:34 Vernet, [name] ([id number]) -------------------------------------------------------------------------------- Multi-Disciplinary Care Plan Details Patient Name: Claus, Umair L. Date of Service: 04/28/2014 9:00 AM Medical Record Number: [id number] Patient Account Number: [id number] Date of Birth/Sex: 1920/06/22 (81 y.o. Male) Treating RN: Kristin Bruins Primary Care Physician: Lindwood Qua Other Clinician: Referring Physician: Lindwood Qua Treating Physician/Extender: Rudene Re in Treatment: 0 Active Inactive Abuse /  Safety / Falls / Self Care Management Nursing Diagnoses: Potential for falls Goals: Patient will remain injury free Date Initiated: 04/28/2014 Goal Status: Active Interventions: Assess fall risk on admission and as needed Notes: Necrotic Tissue Nursing Diagnoses: Impaired tissue integrity related to necrotic/devitalized tissue Goals: Necrotic/devitalized tissue will be minimized in the wound bed Date Initiated: 04/28/2014 Goal Status: Active Interventions: Provide education on necrotic tissue and debridement process Treatment Activities: Apply topical anesthetic as ordered : 04/28/2014 Notes: Nutrition Nursing Diagnoses: Potential for alteratiion in Nutrition/Potential for imbalanced nutrition Heberlein, Yasiel L. ([id number]) Goals: Patient/caregiver agrees to and verbalizes understanding of need to use nutritional supplements and/or vitamins as prescribed Date Initiated: 04/28/2014 Goal Status: Active Interventions: Assess patient nutrition upon admission and as needed per policy Notes: Orientation to the Wound Care Program Nursing Diagnoses: Knowledge deficit related to the wound healing center program Goals: Patient/caregiver will verbalize understanding of the Wound Healing Center Program Date Initiated: 04/28/2014 Goal Status: Active Interventions: Provide education on orientation to the wound center Notes: Wound/Skin Impairment Nursing Diagnoses: Impaired tissue integrity Goals: Ulcer/skin breakdown will have a volume reduction of 30% by week 4 Date Initiated: 04/28/2014 Goal Status: Active Interventions: Assess ulceration(s) every visit Notes: Electronic Signature(s) Signed: 04/28/2014 3:29:45 PM By: Kristin Bruins Entered By: Kristin Bruins on 04/28/2014 10:29:07 Balon, [name] ([id number]) -------------------------------------------------------------------------------- Pain Assessment Details Patient Name: Picchi, Ronan L. Date of Service: 04/28/2014 9:00  AM Medical Record Number: [id number] Patient Account Number: [id number] Date of Birth/Sex: [birth year] (81 y.o. Male) Treating RN: Kristin Bruins Primary Care Physician: Lindwood Qua Other Clinician: Referring Physician: Lindwood Qua Treating Physician/Extender: Rudene Re in Treatment: 0 Active Problems Location of Pain Severity and Description of Pain Patient Has Paino Yes Site Locations Pain Location: Pain in Ulcers With Dressing Change: Yes Duration of the Pain. Constant / Intermittento Intermittent Rate the pain. Current Pain Level: 0 Worst Pain Level: 6 Least Pain Level: 0 Tolerable Pain Level: 6 Character of Pain Describe the Pain: Sharp Pain Management and Medication Current Pain Management: Medication: No Cold Application: No Rest: No Massage: No Activity: No T.E.N.S.: No Heat Application: No Leg drop or elevation: No Is the Current Pain Management Inadequate Adequate: How does your pain impact your activities of daily livingo Sleep: No Bathing: No Appetite: No Relationship With Others: No Bladder Continence: No Emotions: No Bowel Continence: No Work: No Toileting: No Drive: No Dressing: No Hobbies: No Electronic Signature(s) OCTAVIS, SHEELER ([id number]) Signed: 04/28/2014 3:29:45 PM By: Kristin Bruins Entered By: Kristin Bruins on 04/28/2014 09:21:12 Grasmick, [name] ([id number]) -------------------------------------------------------------------------------- Patient/Caregiver Education Details Patient Name: Hermann, Bienvenido L. Date of Service: 04/28/2014 9:00 AM Medical Record Number: [id number] Patient Account Number: [id number] Date of Birth/Gender: [birth year] (81 y.o. Male) Treating RN: Kristin Bruins Primary Care Physician: Lindwood Qua Other Clinician: Referring Physician: Lindwood Qua Treating Physician/Extender: Rudene Re in Treatment: 0 Education Assessment Education Provided To: Patient Education Topics  Provided Hyperbaric Oxygenation: Handouts: Hyperbaric Oxygen Methods: Explain/Verbal Responses: Reinforcements needed Offloading: Handouts: What is Offloadingo Methods: Explain/Verbal Responses: State content correctly Welcome To The [facility]: Handouts: Welcome To The  W[facility] Care [facility] Methods: Demonstration, Explain/Verbal Responses: State content correctly Wound Debridement: Handouts: Wound Debridement Methods: Demonstration, Explain/Verbal Responses: State content correctly Wound/Skin Impairment: Handouts: Caring for Your Ulcer, Other: home care orders to be sent Methods: Demonstration, Explain/Verbal Responses: Reinforcements needed Electronic Signature(s) Signed: 04/28/2014 3:29:45 PM By: Kristin Bruins Entered By: Kristin Bruins on 04/28/2014 10:55:43 Grine, Abisai Elbert Ewings ([id number]) -------------------------------------------------------------------------------- Wound Assessment Details Patient Name: Arvidson, Kamarion L. Date of Service: 04/28/2014 9:00 AM Medical Record Number: [id number] Patient Account Number: [id number] Date of Birth/Sex: 09/09/1920 (81 y.o. Male) Treating RN: Curtis Sites Primary Care Physician: Lindwood Qua Other Clinician: Referring Physician: Lindwood Qua Treating Physician/Extender: Rudene Re in Treatment: 0 Wound Status Wound Number: 1 Primary Etiology: Arterial Insufficiency Ulcer Wound Location: Left Achilles Wound Status: Open Wounding Event: Gradually Appeared Date Acquired: 01/24/2014 Weeks Of Treatment: 0 Clustered Wound: No Photos Photo Uploaded By: Elliot Gurney, RN, BSN, Kim on 04/28/2014 16:05:58 Wound Measurements Length: (cm) 6.7 % Reduction i Width: (cm) 4.7 % Reduction i Depth: (cm) 0.3 Area: (cm) 24.732 Volume: (cm) 7.42 n Area: n Volume: Periwound Skin Texture Texture Color No Abnormalities Noted: No No Abnormalities Noted: No Moisture No Abnormalities Noted: No Electronic Signature(s) Signed:  04/28/2014 4:55:41 PM By: Curtis Sites Entered By: Curtis Sites on 04/28/2014 09:49:25 Tregre, Soham L. ([id number]) -------------------------------------------------------------------------------- Wound Assessment Details Patient Name: Bielinski, Arya L. Date of Service: 04/28/2014 9:00 AM Medical Record Number: [id number] Patient Account Number: [id number] Date of Birth/Sex: 1920-01-23 (81 y.o. Male) Treating RN: Curtis Sites Primary Care Physician: Lindwood Qua Other Clinician: Referring Physician: Lindwood Qua Treating Physician/Extender: Rudene Re in Treatment: 0 Wound Status Wound Number: 2 Primary Etiology: Arterial Insufficiency Ulcer Wound Location: Left, Lateral Calcaneous Wound Status: Open Wounding Event: Gradually Appeared Date Acquired: 01/24/2014 Weeks Of Treatment: 0 Clustered Wound: No Photos Photo Uploaded By: Elliot Gurney, RN, BSN, Kim on 04/28/2014 16:05:59 Wound Measurements Length: (cm) 1.8 % Reduction i Width: (cm) 4 % Reduction i Depth: (cm) 0.1 Area: (cm) 5.655 Volume: (cm) 0.565 n Area: n Volume: Periwound Skin Texture Texture Color No Abnormalities Noted: No No Abnormalities Noted: No Moisture No Abnormalities Noted: No Electronic Signature(s) Signed: 04/28/2014 4:55:41 PM By: Curtis Sites Entered By: Curtis Sites on 04/28/2014 09:54:09 Kendall, Isami L. ([id number]) -------------------------------------------------------------------------------- Wound Assessment Details Patient Name: Eaker, Jabriel L. Date of Service: 04/28/2014 9:00 AM Medical Record Number: [id number] Patient Account Number: [id number] Date of Birth/Sex: 1920-03-05 (81 y.o. Male) Treating RN: Curtis Sites Primary Care Physician: Lindwood Qua Other Clinician: Referring Physician: Lindwood Qua Treating Physician/Extender: Rudene Re in Treatment: 0 Wound Status Wound Number: 3 Primary Etiology: Arterial Insufficiency Ulcer Wound Location: Left Toe  Great Wound Status: Open Wounding Event: Gradually Appeared Date Acquired: 01/24/2014 Weeks Of Treatment: 0 Clustered Wound: No Photos Photo Uploaded By: Elliot Gurney, RN, BSN, Kim on 04/28/2014 16:07:46 Wound Measurements Length: (cm) 1.1 % Reduction i Width: (cm) 1 % Reduction i Depth: (cm) 0.1 Area: (cm) 0.864 Volume: (cm) 0.086 n Area: n Volume: Periwound Skin Texture Texture Color No Abnormalities Noted: No No Abnormalities Noted: No Moisture No Abnormalities Noted: No Electronic Signature(s) Signed: 04/28/2014 4:55:41 PM By: Curtis Sites Entered By: Curtis Sites on 04/28/2014 09:54:57 Stead, Chananya Elbert Ewings ([id number]) -------------------------------------------------------------------------------- Vitals Details Patient Name: Nakata, Muhammadali L. Date of Service: 04/28/2014 9:00 AM Medical Record Number: [id number] Patient Account Number: [id number] Date of Birth/Sex: May 28, 1920 (81 y.o. Male) Treating RN: Kristin Bruins Primary Care Physician: Lindwood Qua Other Clinician: Referring Physician: Lindwood Qua Treating Physician/Extender: Rudene Re in Treatment: 0 Vital Signs  Time Taken: 09:21 Temperature (F): 97.9 Height (in): 70 Pulse (bpm): 72 Source: Stated Respiratory Rate (breaths/min): 22 Weight (lbs): 200 Blood Pressure (mmHg): 102/41 Source: Stated Reference Range: 80 - 120 mg / dl Body Mass Index (BMI): 28.7 Electronic Signature(s) Signed: 04/28/2014 3:29:45 PM By: Kristin Bruins Entered By: Kristin Bruins on 04/28/2014 09:22:26

## 2014-07-01 ENCOUNTER — Encounter: Payer: Medicare Other | Admitting: Surgery

## 2014-07-01 NOTE — Progress Notes (Signed)
[name] ([id number]) Visit Report for 06/30/2014 Chief Complaint Document Details Patient Name: [name], [name] Date of Service: 06/30/2014 8:45 AM Medical Record Number: [id number] Patient Account Number: [id number] Date of Birth/Sex: 07/09/1920 (81 y.o. Male) Treating RN: Primary Care Physician: Lindwood Qua Other Clinician: Referring Physician: Lindwood Qua Treating Physician/Extender: Rudene Re in Treatment: 9 Information Obtained from: Patient Chief Complaint Patient presents to the wound care center for a consult due non healing wound 81 year old gentleman who comes with a history of having some ulcerated areas on his heels since February 2016 and then a large injury to his left posterior heel and ankle since about a month. Electronic Signature(s) Signed: 06/30/2014 4:41:50 PM By: Evlyn Kanner MD, FACS Entered By: Evlyn Kanner on 06/30/2014 09:41:19 [name] ([id number]) -------------------------------------------------------------------------------- Debridement Details Patient Name: [name], [name] Date of Service: 06/30/2014 8:45 AM Medical Record Number: [id number] Patient Account Number: [id number] Date of Birth/Sex: 01-25-1920 (81 y.o. Male) Treating RN: Primary Care Physician: Lindwood Qua Other Clinician: Referring Physician: Lindwood Qua Treating Physician/Extender: Rudene Re in Treatment: 9 Debridement Performed for Wound #1 Left Achilles Assessment: Performed By: Physician Tristan Schroeder., MD Debridement: Debridement Pre-procedure Yes Verification/Time Out Taken: Start Time: 09:29 Pain Control: Lidocaine 4% Topical Solution Level: Skin/Subcutaneous Tissue Total Area Debrided (L x 5.7 (cm) x 4.8 (cm) = 27.36 (cm) W): Tissue and other Viable, Non-Viable, Fibrin/Slough, Ligament, Subcutaneous material debrided: Instrument: Forceps, Scissors Bleeding: Minimum Hemostasis Achieved: Silver Nitrate End Time:  09:33 Procedural Pain: 0 Post Procedural Pain: 0 Response to Treatment: Procedure was tolerated well Post Debridement Measurements of Total Wound Length: (cm) 5.7 Width: (cm) 4.8 Depth: (cm) 0.6 Volume: (cm) 12.893 Electronic Signature(s) Signed: 06/30/2014 4:41:50 PM By: Evlyn Kanner MD, FACS Entered By: Evlyn Kanner on 06/30/2014 09:39:57 [name] ([id number]) -------------------------------------------------------------------------------- Debridement Details Patient Name: [name], [name] Date of Service: 06/30/2014 8:45 AM Medical Record Number: [id number] Patient Account Number: [id number] Date of Birth/Sex: 19-Apr-1920 (81 y.o. Male) Treating RN: Primary Care Physician: Lindwood Qua Other Clinician: Referring Physician: Lindwood Qua Treating Physician/Extender: Rudene Re in Treatment: 9 Debridement Performed for Wound #2 Left,Lateral Calcaneous Assessment: Performed By: Physician Tristan Schroeder., MD Debridement: Open Wound/Selective Debridement Selective Description: Pre-procedure Yes Verification/Time Out Taken: Start Time: 09:26 Pain Control: Lidocaine 4% Topical Solution Level: Non-Viable Tissue Total Area Debrided (L x 0.8 (cm) x 3.5 (cm) = 2.8 (cm) W): Tissue and other Non-Viable, Eschar, Fibrin/Slough material debrided: Instrument: Forceps, Scissors Bleeding: Minimum Hemostasis Achieved: Pressure End Time: 09:29 Procedural Pain: 0 Post Procedural Pain: 0 Response to Treatment: Procedure was tolerated well Post Debridement Measurements of Total Wound Length: (cm) 0.8 Width: (cm) 3.5 Depth: (cm) 0.2 Volume: (cm) 0.44 Electronic Signature(s) Signed: 06/30/2014 4:41:50 PM By: Evlyn Kanner MD, FACS Entered By: Evlyn Kanner on 06/30/2014 09:40:37 [name] ([id number]) -------------------------------------------------------------------------------- Debridement Details Patient Name: [name], [name] Date of Service:  06/30/2014 8:45 AM Medical Record Number: [id number] Patient Account Number: [id number] Date of Birth/Sex: May 11, 1920 (81 y.o. Male) Treating RN: Primary Care Physician: Lindwood Qua Other Clinician: Referring Physician: Lindwood Qua Treating Physician/Extender: Rudene Re in Treatment: 9 Debridement Performed for Wound #3 Left Toe Great Assessment: Performed By: Physician Tristan Schroeder., MD Debridement: Debridement Pre-procedure Yes Verification/Time Out Taken: Start Time: 09:20 Pain Control: Lidocaine 4% Topical Solution Level: Skin/Subcutaneous Tissue Total Area Debrided (L x 1.2 (cm) x 1 (cm) = 1.2 (cm) W): Tissue and other Viable, Non-Viable, Eschar, Exudate, Fibrin/Slough, Subcutaneous material debrided: Instrument: Forceps, Scissors Bleeding:  Minimum Hemostasis Achieved: Pressure End Time: 09:24 Procedural Pain: 0 Post Procedural Pain: 0 Response to Treatment: Procedure was tolerated well Post Debridement Measurements of Total Wound Length: (cm) 1.2 Width: (cm) 1 Depth: (cm) 0.2 Volume: (cm) 0.188 Electronic Signature(s) Signed: 06/30/2014 4:41:50 PM By: Evlyn Kanner MD, FACS Entered By: Evlyn Kanner on 06/30/2014 09:41:11 [name] ([id number]) -------------------------------------------------------------------------------- HPI Details Patient Name: [name], [name] Date of Service: 06/30/2014 8:45 AM Medical Record Number: [id number] Patient Account Number: [id number] Date of Birth/Sex: Oct 07, 1920 (81 y.o. Male) Treating RN: Primary Care Physician: Lindwood Qua Other Clinician: Referring Physician: Lindwood Qua Treating Physician/Extender: Rudene Re in Treatment: 9 History of Present Illness HPI Description: 81 year old gentleman who was known to be a diabetic for many years has peripheral neuropathy recently went to his primary care doctor for a punctured wound on his left heel which was something he had noticed. The  patient then was referred to a podiatrist who referred him to Dr. Wyn Quaker in the vascular surgery department and I understand the procedure was done on 03/14/2014 with a left lower extremity vascular procedure and stenting was done. Details of this are not available at the present time. The patient has been applying Neosporin to his leg and has not had any wound care addressed so far. patient has also had a history of coronary artery disease in the past and hasn't had a CABG and a pacemaker placement in the remote past.Other details and notes are pending. the patient is not in pain lives alone and has some home health and other aides coming to help him with his daily chores and meals. reviewing the vascular notes I understand the procedure was done on 03/14/2014 and he was operated by Dr. Wyn Quaker. he had a catheter placement to the left peroneal artery and a aortogram and selective left lower extremity angiogram was done. He also had a percutaneous transluminal angioplasty of the left peroneal artery and the tibioperoneal trunk. The distal SFA and above-knee popliteal artery were also angioplastied. A subcutaneous stent placement to the distal superficial femoral artery was also done. 05/05/2014 -- the patient has not had any change in his health and after much consideration is decided that he does not want HBOT as he is claustrophobic and was unable to tolerate being in the chamber for 90 minutes. I have discussed with him that his most recent x-ray of the foot shows that he has the distal phalanx of the left great toe showing changes with osteomyelitis cannot be excluded at that site. He tells me that he cannot have an MRI because of his defibrillator and hence we will order a triple phase bone scan. I have also reviewed his culture report which shows several organisms and he has sensitivity to tetracycline and we have recommended he takes this for 14 days and this has been given to him on  05/02/2014 05/12/2014 the bone scan done on 05/10/2014 shows #1 findings are worrisome for osteomyelitis involving the calcaneus of the left foot, #2 increase uptake localizing to the left second and third toe on all 3 phases. Cannot rule out osteomyelitis in this area. And #3 left foot cellulitis. 05/19/2014 -- reviewed several reports which we have received back on [facility] Coom[facility]. #1 chest x-ray done on April 21 shows chronic changes in the left base no acute findings. #2 his CBC is within normal limits. #3 hemoglobin A1c was 8.5%. #4 EKG done on 26 April was within normal limits due to his electronic ventricular pacemaker. 05/26/2014 --  he has had his PICC line placed and is taking vancomycin daily basis. He is to start hyperbaric oxygen therapy on this coming Monday. 06/09/2014 -- he saw Dr. Sampson Goon yesterday and besides his IV antibiotic I believe a oral antibiotic was LUISDANIEL, KENTON. ([id number]) also given and this may be Cipro. Patient is feeling fine otherwise does not have any symptoms though his blood pressure this morning in the wound center has been 90/50. We will check his blood pressure in the supine position. 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 Complained of worsening fatigue yesterday after HBO. Feels better today. Has appointment with PCP this afternoon. He wishes to hold off on HBO until next week. No significant pain. No fever or chills. Stable drainage. 06/23/2014 Gianny has taken a break from HBO T and has been feeling a little better. He was seen by the nurse practitioner at his PCPs office and at that time his vitals were stable and they did get some EKG done which was within normal limits. There have sent out some lab work which we still have to receive reports. He is going to see his PCP back this afternoon and will be seeing Dr. Sampson Goon tomorrow for review. addendum: we have received labs from his PCPs office and note  that his potassium is high at 5.4 and his BUN/creatinine is slightly raised. His blood sugar was 216. His total protein and albumen were 5.9 and 3 and this was low. His HandH was 10.3 and 31.4 WBC was 8.2 and his platelets were 304. CRP was 29.6 which was high. Vancomycin trough was 19.1 which was normal TSH was 3.11 which was normal 06/30/2014 -- Eliberto Ivory feels weak today and he feels his stomach is acting up and his not feeling up to doing hyperbaric oxygen therapy. He would like to take a break today and tomorrow and resume on Monday. He is on vancomycin and Levaquin as per ID and he has an appointment to see the vascular surgeons in about 10 days' time. Electronic Signature(s) Signed: 06/30/2014 4:41:50 PM By: Evlyn Kanner MD, FACS Entered By: Evlyn Kanner on 06/30/2014 09:42:13 [name], [name] ([id number]) -------------------------------------------------------------------------------- Physical Exam Details Patient Name: Caltagirone, Izan L. Date of Service: 06/30/2014 8:45 AM Medical Record Number: [id number] Patient Account Number: [id number] Date of Birth/Sex: March 21, 1920 (81 y.o. Male) Treating RN: Primary Care Physician: Lindwood Qua Other Clinician: Referring Physician: Lindwood Qua Treating Physician/Extender: Rudene Re in Treatment: 9 Constitutional . Pulse regular. Respirations normal and unlabored. Afebrile. . Eyes Nonicteric. Reactive to light. Ears, Nose, Mouth, and Throat Lips, teeth, and gums WNL.Marland Kitchen Moist mucosa without lesions . Neck supple and nontender. No palpable supraclavicular or cervical adenopathy. Normal sized without goiter. Respiratory WNL. No retractions.. Cardiovascular no palpable pedal pulses. No clubbing, cyanosis or edema. Integumentary (Hair, Skin) The left big toe, the calculi in area has some eschar, and will need sharp debridement. his left posterior ankle region as a lot of debris which need sharp debridement. However it is now  coming down to the bone and some fibers of deep tendon. . No crepitus or fluctuance. No peri-wound warmth or erythema. No masses.Marland Kitchen Psychiatric Judgement and insight Intact.. No evidence of depression, anxiety, or agitation.. Electronic Signature(s) Signed: 06/30/2014 4:41:50 PM By: Evlyn Kanner MD, FACS Entered By: Evlyn Kanner on 06/30/2014 09:44:32 Chilson, [name] ([id number]) -------------------------------------------------------------------------------- Physician Orders Details Patient Name: Vanalstine, Korin L. Date of Service: 06/30/2014 8:45 AM Medical Record Number: [id number] Patient Account Number:  [id number] Date of Birth/Sex: May 13, 1920 (81 y.o. Male) Treating RN: Curtis Sites Primary Care Physician: Lindwood Qua Other Clinician: Referring Physician: Lindwood Qua Treating Physician/Extender: Rudene Re in Treatment: 9 Verbal / Phone Orders: Yes Clinician: Curtis Sites Read Back and Verified: Yes Diagnosis Coding Wound Cleansing Wound #1 Left Achilles o Clean wound with Normal Saline. o May shower with protection. Wound #2 Left,Lateral Calcaneous o Clean wound with Normal Saline. o May shower with protection. Wound #3 Left Toe Great o Clean wound with Normal Saline. o May shower with protection. Anesthetic Wound #1 Left Achilles o Topical Lidocaine 4% cream applied to wound bed prior to debridement Wound #2 Left,Lateral Calcaneous o Topical Lidocaine 4% cream applied to wound bed prior to debridement Wound #3 Left Toe Great o Topical Lidocaine 4% cream applied to wound bed prior to debridement Skin Barriers/Peri-Wound Care Wound #1 Left Achilles o Skin Prep Wound #2 Left,Lateral Calcaneous o Skin Prep Wound #3 Left Toe Great o Skin Prep Primary Wound Dressing Wound #1 Left Achilles o Santyl Ointment [name], [name] ([id number]) Wound #2 Left,Lateral Calcaneous o Prisma Ag - or collagen with silver  equivalent Wound #3 Left Toe Great o Prisma Ag - or collagen with silver equivalent Secondary Dressing Wound #1 Left Achilles o Gauze, ABD and Kerlix/Conform Wound #2 Left,Lateral Calcaneous o Gauze, ABD and Kerlix/Conform Wound #3 Left Toe Great o Gauze, ABD and Kerlix/Conform Dressing Change Frequency Wound #1 Left Achilles o Change dressing every day. Wound #2 Left,Lateral Calcaneous o Change dressing every day. Wound #3 Left Toe Great o Change dressing every day. Follow-up Appointments Wound #1 Left Achilles o Return Appointment in 1 week. Wound #2 Left,Lateral Calcaneous o Return Appointment in 1 week. Wound #3 Left Toe Great o Return Appointment in 1 week. Additional Orders / Instructions Wound #1 Left Achilles o Increase protein intake. o Other: - goal is to keep blood sugars under 180 Wound #2 Left,Lateral Calcaneous o Increase protein intake. o Other: - goal is to keep blood sugars under 180 Wound #3 Left Toe Great o Increase protein intake. [name], [name] ([id number]) o Other: - goal is to keep blood sugars under 180 Home Health Wound #1 Left Achilles o Continue Home Health Visits - [facility] o Home Health Nurse may visit PRN to address patientos wound care needs. o FACE TO FACE ENCOUNTER: MEDICARE and MEDICAID PATIENTS: I certify that this patient is under my care and that I had a face-to-face encounter that meets the physician face-to-face encounter requirements with this patient on this date. The encounter with the patient was in whole or in part for the following MEDICAL CONDITION: (primary reason for Home Healthcare) MEDICAL NECESSITY: I certify, that based on my findings, NURSING services are a medically necessary home health service. HOME BOUND STATUS: I certify that my clinical findings support that this patient is homebound (i.e., Due to illness or injury, pt requires aid of supportive devices such as  crutches, cane, wheelchairs, walkers, the use of special transportation or the assistance of another person to leave their place of residence. There is a normal inability to leave the home and doing so requires considerable and taxing effort. Other absences are for medical reasons / religious services and are infrequent or of short duration when for other reasons). o If current dressing causes regression in wound condition, may D/C ordered dressing product/s and apply Normal Saline Moist Dressing daily until next Wound Healing Center / Other MD appointment. Notify Wound Healing Center of regression  in wound condition at [phone]. o Please direct any NON-WOUND related issues/requests for orders to patient's Primary Care Physician Wound #2 Left,Lateral Calcaneous o Continue Home Health Visits - [facility] o Home Health Nurse may visit PRN to address patientos wound care needs. o FACE TO FACE ENCOUNTER: MEDICARE and MEDICAID PATIENTS: I certify that this patient is under my care and that I had a face-to-face encounter that meets the physician face-to-face encounter requirements with this patient on this date. The encounter with the patient was in whole or in part for the following MEDICAL CONDITION: (primary reason for Home Healthcare) MEDICAL NECESSITY: I certify, that based on my findings, NURSING services are a medically necessary home health service. HOME BOUND STATUS: I certify that my clinical findings support that this patient is homebound (i.e., Due to illness or injury, pt requires aid of supportive devices such as crutches, cane, wheelchairs, walkers, the use of special transportation or the assistance of another person to leave their place of residence. There is a normal inability to leave the home and doing so requires considerable and taxing effort. Other absences are for medical reasons / religious services and are infrequent or of short duration when for  other reasons). o If current dressing causes regression in wound condition, may D/C ordered dressing product/s and apply Normal Saline Moist Dressing daily until next Wound Healing Center / Other MD appointment. Notify Wound Healing Center of regression in wound condition at [phone]. o Please direct any NON-WOUND related issues/requests for orders to patient's Primary Care Physician Wound #3 Left Toe Doretha Sou Continue Home Health Visits - [facility] o Home Health Nurse may visit PRN to address patientos wound care needs. ACY, ORSAK ([id number]) o FACE TO FACE ENCOUNTER: MEDICARE and MEDICAID PATIENTS: I certify that this patient is under my care and that I had a face-to-face encounter that meets the physician face-to-face encounter requirements with this patient on this date. The encounter with the patient was in whole or in part for the following MEDICAL CONDITION: (primary reason for Home Healthcare) MEDICAL NECESSITY: I certify, that based on my findings, NURSING services are a medically necessary home health service. HOME BOUND STATUS: I certify that my clinical findings support that this patient is homebound (i.e., Due to illness or injury, pt requires aid of supportive devices such as crutches, cane, wheelchairs, walkers, the use of special transportation or the assistance of another person to leave their place of residence. There is a normal inability to leave the home and doing so requires considerable and taxing effort. Other absences are for medical reasons / religious services and are infrequent or of short duration when for other reasons). o If current dressing causes regression in wound condition, may D/C ordered dressing product/s and apply Normal Saline Moist Dressing daily until next Wound Healing Center / Other MD appointment. Notify Wound Healing Center of regression in wound condition at [phone]. o Please direct any NON-WOUND related  issues/requests for orders to patient's Primary Care Physician Hyperbaric Oxygen Therapy Wound #3 Left Toe Great o Indication: - osteomyelitis o If appropriate for treatment, begin HBOT per protocol: o 2.0 ATA for 90 Minutes without Air Breaks o One treatment per day (delivered Monday through Friday unless otherwise specified in Special Instructions below): o Total # of Treatments: - 40 o Finger stick Blood Glucose Pre- and Post- HBOT Treatment. o Follow Hyperbaric Oxygen Glycemia Protocol o Other - in chamber TCOM HBO Contraindications Wound #3 Left Toe Great - Left  Lower Extremity o HBO contraindications of hyperbaric oxygen therapy were reviewed and the patient found to have no untreated pneumothorax or history of spontaneous pneumothorax. o HBO contraindications of hyperbaric oxygen therapy were reviewed and the patient found to have no history of medications such as Bleomycin, Adriamycin, disulfiram, cisplatin and sulfamylon and is not currently receiving any chemotherapy. o HBO contraindications of hyperbaric oxygen therapy were reviewed and the patient found to have no Upper respiratory infection and chronic sinusitis. o HBO contraindications of hyperbaric oxygen therapy were reviewed and the patient found to have no history of retinal surgery proceeding 6 weeks or intraocular gas o HBO contraindications of hyperbaric oxygen therapy were reviewed and the patient found to have no history seizure disorder or any anticonvulsant medication. o HBO contraindications of hyperbaric oxygen therapy were reviewed and the patient found to have no septicemia with CO2 retention. o HBO contraindications of hyperbaric oxygen therapy were reviewed and the patient found to have no fever greater than 100 degrees. o HBO contraindications of hyperbaric oxygen therapy were reviewed and the patient found to have no pregnancy noted. [name], [name] ([id number]) o HBO  contraindications of hyperbaric oxygen therapy were reviewed and the patient found to have no medications such as steroids or narcotics or Phenergan. Medications-please add to medication list. Wound #1 Left Achilles - Left Lower Extremity o Santyl Enzymatic Ointment GLYCEMIA INTERVENTIONS PROTOCOL PRE-HBO GLYCEMIA INTERVENTIONS ACTION INTERVENTION Obtain pre-HBO capillary blood 1 glucose (ensure physician order is in chart). A. Notify HBO physician and await physician orders. 2 If result is 70 mg/dl or below: B. If the result meets the hospital definition of a critical result, follow hospital policy. A. Give patient an 8 ounce Glucerna Shake, an 8 ounce Ensure, or 8 ounces of a Glucerna/Ensure equivalent dietary supplement*. B. Wait 30 minutes. If result is 71 mg/dl to 409 mg/dl: C. Retest patientos capillary blood glucose (CBG). D. If result greater than or equal to 110 mg/dl, proceed with HBO. If result less than 110 mg/dl, notify HBO physician and consider holding HBO. If result is 131 mg/dl to 811 mg/dl: A. Proceed with HBO. A. Notify HBO physician and await physician orders. B. It is recommended to hold HBO and do blood/urine ketone If result is 250 mg/dl or greater: testing. C. If the result meets the hospital definition of a critical result, follow hospital policy. POST-HBO GLYCEMIA INTERVENTIONS ACTION INTERVENTION Obtain post HBO capillary blood 1 glucose (ensure physician order is in chart). 2 If result is 70 mg/dl or below: A. Notify HBO physician and await physician orders. [name], [name] ([id number]) B. If the result meets the hospital definition of a critical result, follow hospital policy. A. Give patient an 8 ounce Glucerna Shake, an 8 ounce Ensure, or 8 ounces of a Glucerna/Ensure equivalent dietary supplement*. B. Wait 15 minutes for symptoms of hypoglycemia (i.e. nervousness, anxiety, If result is 71 mg/dl to 213  mg/dl: sweating, chills, clamminess, irritability, confusion, tachycardia or dizziness). C. If patient asymptomatic, discharge patient. If patient symptomatic, repeat capillary blood glucose (CBG) and notify HBO physician. If result is 101 mg/dl to 086 mg/dl: A. Discharge patient. A. Notify HBO physician and await physician orders. B. It is recommended to do If result is 250 mg/dl or greater: blood/urine ketone testing. C. If the result meets the hospital definition of a critical result, follow hospital policy. *Juice or candies are NOT equivalent products. If patient refuses the Glucerna or Ensure, please consult the hospital dietitian for an appropriate substitute. Electronic  Signature(s) Signed: 06/30/2014 4:41:50 PM By: Evlyn Kanner MD, FACS Signed: 06/30/2014 5:03:06 PM By: Curtis Sites Entered By: Curtis Sites on 06/30/2014 09:35:06 Zoss, [name] ([id number]) -------------------------------------------------------------------------------- Problem List Details Patient Name: Cly, Jaydenn L. Date of Service: 06/30/2014 8:45 AM Medical Record Number: [id number] Patient Account Number: [id number] Date of Birth/Sex: 1920-06-25 (81 y.o. 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 Date of Service: 06/30/2014 8:45 AM Medical Record Number: [id number] Patient Account Number: [id number] Date of Birth/Sex: 1920/07/12 (82 y.o. Male) Treating RN: Primary Care Physician: Lindwood Qua Other Clinician: Referring Physician: Lindwood Qua Treating Physician/Extender: Rudene Re in Treatment: 9 Subjective Chief Complaint Information obtained from Patient Patient presents to the wound care center for a consult due non healing wound 81 year old gentleman who comes with a history of having some ulcerated areas on his heels since February 2016 and then a large injury to his left posterior heel and ankle since about a month. History of Present Illness (HPI) 81 year old gentleman who was known to be a diabetic for many years has peripheral neuropathy recently went to his primary care doctor for a punctured wound on his left heel which was something he had noticed. The patient then was referred to a podiatrist who referred him to Dr. Wyn Quaker in the vascular surgery department and I understand the procedure was done on 03/14/2014 with a left lower extremity vascular procedure and stenting was done. Details of this are not available at the present time. The patient has been applying Neosporin to his leg and has not had any wound care addressed so far. patient has also had a history of coronary artery disease in the past and hasn't had a CABG and a pacemaker placement in the remote past.Other details and notes are pending. the patient is not in pain lives alone and has some home health and other aides coming to help him with his daily chores and meals. reviewing the vascular notes I understand the procedure was done on  03/14/2014 and he was operated by Dr. Wyn Quaker. he had a catheter placement to the left peroneal artery and a aortogram and selective left lower extremity angiogram was done. He also had a percutaneous transluminal angioplasty of the left peroneal artery and the tibioperoneal trunk. The distal SFA and above-knee popliteal artery were also angioplastied. A subcutaneous stent placement to the distal superficial femoral artery was also done. 05/05/2014 -- the patient has not had any change in his health and after much consideration is decided that he does not want HBOT as he is claustrophobic and was unable to tolerate being in the chamber for 90 minutes. I have discussed with him that his most recent x-ray of the foot shows that he has the distal phalanx of the left great toe showing changes with osteomyelitis cannot be  excluded at that site. He tells me that he cannot have an MRI because of his defibrillator and hence we will order a triple phase bone scan. I have also reviewed his culture report which shows several organisms and he has sensitivity to tetracycline and we have recommended he takes this for 14 days and this has been given to him on 05/02/2014 05/12/2014 the bone scan done on 05/10/2014 shows #1 findings are worrisome for osteomyelitis involving the calcaneus of the left foot, #2 increase uptake localizing to the left second and third toe on all 3 phases. Cannot rule out osteomyelitis in this area. And #3 left foot cellulitis. 05/19/2014 -- reviewed several reports which we have received back on [facility]om[facility]. #1 chest x-ray done on April 21 shows chronic changes in the left base no acute findings. [name], [name] ([id number]) #2 his CBC is within normal limits. #3 hemoglobin A1c was 8.5%. #4 EKG done on 26 April was within normal limits due to his electronic ventricular pacemaker. 05/26/2014 -- he has had his PICC line placed and is taking vancomycin daily basis. He is to  start hyperbaric oxygen therapy on this coming Monday. 06/09/2014 -- he saw Dr. Sampson Goon yesterday and besides his IV antibiotic I believe a oral antibiotic was also given and this may be Cipro. Patient is feeling fine otherwise does not have any symptoms though his blood pressure this morning in the wound center has been 90/50. We will check his blood pressure in the supine position. 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 Complained of worsening fatigue yesterday after HBO. Feels better today. Has appointment with PCP this afternoon. He wishes to hold off on HBO until next week. No significant pain. No fever or chills. Stable drainage. 06/23/2014 Chace has taken a break from HBO T and has been feeling a little better. He was seen by the nurse practitioner at his PCPs office and at that time his vitals were stable and they did get some EKG done which was within normal limits. There have sent out some lab work which we still have to receive reports. He is going to see his PCP back this afternoon and will be seeing Dr. Sampson Goon tomorrow for review. addendum: we have received labs from his PCPs office and note that his potassium is high at 5.4 and his BUN/creatinine is slightly raised. His blood sugar was 216. His total protein and albumen were 5.9 and 3 and this was low. His HandH was 10.3 and 31.4 WBC was 8.2 and his platelets were 304. CRP was 29.6 which was high. Vancomycin trough was 19.1 which was normal TSH was 3.11 which was normal 06/30/2014 -- Eliberto Ivory feels weak today and he feels his stomach is acting up and his not feeling up to doing hyperbaric oxygen therapy. He would like to take a break today and tomorrow and resume on Monday. He is on vancomycin and Levaquin as per ID and he has an appointment to see the vascular surgeons in about 10 days' time. Objective Constitutional Pulse regular. Respirations normal and unlabored.  Afebrile. Vitals Time Taken: 9:01 AM, Height: 70 in, Weight: 200 lbs, BMI: 28.7, Temperature: 97.7 F, Pulse: 71 bpm, Respiratory Rate: 18 breaths/min, Blood Pressure: 112/36 mmHg, Capillary Blood Glucose: 171 mg/dl. Eyes Nonicteric. Reactive to light. [name], [name] ([id number]) Ears, Nose, Mouth, and Throat Lips, teeth, and gums WNL.Marland Kitchen Moist mucosa without lesions . Neck supple and nontender. No palpable supraclavicular or cervical adenopathy. Normal  sized without goiter. Respiratory WNL. No retractions.. Cardiovascular no palpable pedal pulses. No clubbing, cyanosis or edema. Psychiatric Judgement and insight Intact.. No evidence of depression, anxiety, or agitation.. Integumentary (Hair, Skin) The left big toe, the calculi in area has some eschar, and will need sharp debridement. his left posterior ankle region as a lot of debris which need sharp debridement. However it is now coming down to the bone and some fibers of deep tendon. . No crepitus or fluctuance. No peri-wound warmth or erythema. No masses.. Wound #1 status is Open. Original cause of wound was Gradually Appeared. The wound is located on the Left Achilles. The wound measures 5.7cm length x 4.8cm width x 0.6cm depth; 21.488cm^2 area and 12.893cm^3 volume. There is tendon exposed. There is a medium amount of serous drainage noted. The wound margin is epibole. There is medium (34-66%) pink granulation within the wound bed. There is a medium (34-66%) amount of necrotic tissue within the wound bed including Eschar and Adherent Slough. The periwound skin appearance exhibited: Moist, Rubor, Erythema. The periwound skin appearance did not exhibit: Callus, Crepitus, Excoriation, Fluctuance, Friable, Induration, Localized Edema, Rash, Scarring, Dry/Scaly, Maceration, Atrophie Blanche, Cyanosis, Ecchymosis, Hemosiderin Staining, Mottled, Pallor. The surrounding wound skin color is noted with erythema which is circumferential.  Periwound temperature was noted as No Abnormality. Wound #2 status is Open. Original cause of wound was Gradually Appeared. The wound is located on the Left,Lateral Calcaneous. The wound measures 0.8cm length x 3.5cm width x 0.2cm depth; 2.199cm^2 area and 0.44cm^3 volume. The wound is limited to skin breakdown. There is a small amount of serous drainage noted. The wound margin is distinct with the outline attached to the wound base. There is small (1-33%) pink granulation within the wound bed. There is a medium (34-66%) amount of necrotic tissue within the wound bed including Adherent Slough. The periwound skin appearance exhibited: Dry/Scaly, Moist. The periwound skin appearance did not exhibit: Callus, Crepitus, Excoriation, Fluctuance, Friable, Induration, Localized Edema, Rash, Scarring, Maceration, Atrophie Blanche, Cyanosis, Ecchymosis, Hemosiderin Staining, Mottled, Pallor, Rubor, Erythema. Periwound temperature was noted as No Abnormality. The periwound has tenderness on palpation. Wound #3 status is Open. Original cause of wound was Gradually Appeared. The wound is located on the Left Toe Great. The wound measures 1.2cm length x 1cm width x 0.2cm depth; 0.942cm^2 area and 0.188cm^3 volume. The wound is limited to skin breakdown. There is a none present amount of drainage noted. The wound margin is distinct with the outline attached to the wound base. There is no granulation within the wound bed. There is a large (67-100%) amount of necrotic tissue within the wound bed including Eschar. The periwound skin appearance exhibited: Dry/Scaly. The periwound skin appearance did not Decoursey, Aaryan L. ([id number]) exhibit: Callus, Crepitus, Excoriation, Fluctuance, Friable, Induration, Localized Edema, Rash, Scarring, Maceration, Moist, Atrophie Blanche, Cyanosis, Ecchymosis, Hemosiderin Staining, Mottled, Pallor, Rubor, Erythema. Periwound temperature was noted as No Abnormality. The left big  toe, the calculi in area has some eschar, and will need sharp debridement. his left posterior ankle region as a lot of debris which need sharp debridement. However it is now coming down to the bone and some fibers of deep tendon. 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 Santyl will be applied to the area of his posterior ankle and Prisma to the rest of the wounds. I believe he will benefit from debridement in the OR and application of wound VAC and we will talk to the vascular surgeons when he sees them next. Other than that all questions have been answered and he will come back to see [place] on Monday for HBOT. 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 There was a Skin/Subcutaneous Tissue Debridement ([id number]) debridement with total area of 27.36 sq cm performed by Istvan Behar, Ignacia Felling., MD. with the following instrument(s): Forceps and Scissors to remove Viable and Non-Viable tissue/material including Fibrin/Slough, Ligament, and Subcutaneous after achieving pain control using Lidocaine 4% Topical Solution. A time out was conducted prior to the start of the procedure. A Minimum amount of bleeding was controlled with Silver Nitrate. The procedure was tolerated well with a pain level of 0 throughout and a pain level of 0 following the procedure. Post Debridement Measurements: 5.7cm length x 4.8cm width x 0.6cm depth; 12.893cm^3  volume. Mcknight, Jaamal L. ([id number]) Wound #2 Wound #2 is an Arterial Insufficiency Ulcer located on the Left,Lateral Calcaneous . There was a Non- Viable Tissue Open Wound/Selective [phone]) debridement with total area of 2.8 sq cm performed by Lochlin Eppinger, Ignacia Felling., MD. with the following instrument(s): Forceps and Scissors to remove Non-Viable tissue/material including Fibrin/Slough and Eschar after achieving pain control using Lidocaine 4% Topical Solution. A time out was conducted prior to the start of the procedure. A Minimum amount of bleeding was controlled with Pressure. The procedure was tolerated well with a pain level of 0 throughout and a pain level of 0 following the procedure. Post Debridement Measurements: 0.8cm length x 3.5cm width x 0.2cm depth; 0.44cm^3 volume. Wound #3 Wound #3 is an Arterial Insufficiency Ulcer located on the Left Toe Great . There was a Skin/Subcutaneous Tissue Debridement ([id number]) debridement with total area of 1.2 sq cm performed by Patricia Fargo, Ignacia Felling., MD. with the following instrument(s): Forceps and Scissors to remove Viable and Non-Viable tissue/material including Exudate, Fibrin/Slough, Eschar, and Subcutaneous after achieving pain control using Lidocaine 4% Topical Solution. A time out was conducted prior to the start of the procedure. A Minimum amount of bleeding was controlled with Pressure. The procedure was tolerated well with a pain level of 0 throughout and a pain level of 0 following the procedure. Post Debridement Measurements: 1.2cm length x 1cm width x 0.2cm depth; 0.188cm^3 volume. Plan Wound Cleansing: Wound #1 Left Achilles: Clean wound with Normal Saline. May shower with protection. Wound #2 Left,Lateral Calcaneous: Clean wound with Normal Saline. May shower with protection. Wound #3 Left Toe Great: Clean wound with Normal Saline. May shower with protection. Anesthetic: Wound #1 Left Achilles: Topical Lidocaine 4% cream  applied to wound bed prior to debridement Wound #2 Left,Lateral Calcaneous: Topical Lidocaine 4% cream applied to wound bed prior to debridement Wound #3 Left Toe Great: Topical Lidocaine 4% cream applied to wound bed prior to debridement Skin Barriers/Peri-Wound Care: Wound #1 Left Achilles: Skin Prep Wound #2 Left,Lateral Calcaneous: Skin Prep Wound #3 Left Toe GreatJASSIEL, FLYE ([id number]) Skin Prep Primary Wound Dressing: Wound #1 Left Achilles: Santyl Ointment Wound #2 Left,Lateral Calcaneous: Prisma  Ag - or collagen with silver equivalent Wound #3 Left Toe Great: Prisma Ag - or collagen with silver equivalent Secondary Dressing: Wound #1 Left Achilles: Gauze, ABD and Kerlix/Conform Wound #2 Left,Lateral Calcaneous: Gauze, ABD and Kerlix/Conform Wound #3 Left Toe Great: Gauze, ABD and Kerlix/Conform Dressing Change Frequency: Wound #1 Left Achilles: Change dressing every day. Wound #2 Left,Lateral Calcaneous: Change dressing every day. Wound #3 Left Toe Great: Change dressing every day. Follow-up Appointments: Wound #1 Left Achilles: Return Appointment in 1 week. Wound #2 Left,Lateral Calcaneous: Return Appointment in 1 week. Wound #3 Left Toe Great: Return Appointment in 1 week. Additional Orders / Instructions: Wound #1 Left Achilles: Increase protein intake. Other: - goal is to keep blood sugars under 180 Wound #2 Left,Lateral Calcaneous: Increase protein intake. Other: - goal is to keep blood sugars under 180 Wound #3 Left Toe Great: Increase protein intake. Other: - goal is to keep blood sugars under 180 Home Health: Wound #1 Left Achilles: Continue Home Health Visits - [facility] Nurse may visit PRN to address patient s wound care needs. FACE TO FACE ENCOUNTER: MEDICARE and MEDICAID PATIENTS: I certify that this patient is under my care and that I had a face-to-face encounter that meets the physician face-to-face  encounter requirements with this patient on this date. The encounter with the patient was in whole or in part for the following MEDICAL CONDITION: (primary reason for Home Healthcare) MEDICAL NECESSITY: I certify, that based on my findings, NURSING services are a medically necessary home health service. HOME BOUND STATUS: I certify that my clinical findings support that this patient is homebound (i.e., Due to illness or injury, pt requires aid of supportive devices such as crutches, cane, wheelchairs, walkers, the use of special transportation or the assistance of another person to leave their place of residence. There is a Ketchem, Thatcher L. ([id number]) normal inability to leave the home and doing so requires considerable and taxing effort. Other absences are for medical reasons / religious services and are infrequent or of short duration when for other reasons). If current dressing causes regression in wound condition, may D/C ordered dressing product/s and apply Normal Saline Moist Dressing daily until next Wound Healing Center / Other MD appointment. Notify Wound Healing Center of regression in wound condition at [phone]. Please direct any NON-WOUND related issues/requests for orders to patient's Primary Care Physician Wound #2 Left,Lateral Calcaneous: Continue Home Health Visits - [facility] Health Nurse may visit PRN to address patient s wound care needs. FACE TO FACE ENCOUNTER: MEDICARE and MEDICAID PATIENTS: I certify that this patient is under my care and that I had a face-to-face encounter that meets the physician face-to-face encounter requirements with this patient on this date. The encounter with the patient was in whole or in part for the following MEDICAL CONDITION: (primary reason for Home Healthcare) MEDICAL NECESSITY: I certify, that based on my findings, NURSING services are a medically necessary home health service. HOME BOUND STATUS: I certify that my  clinical findings support that this patient is homebound (i.e., Due to illness or injury, pt requires aid of supportive devices such as crutches, cane, wheelchairs, walkers, the use of special transportation or the assistance of another person to leave their place of residence. There is a normal inability to leave the home and doing so requires considerable and taxing effort. Other absences are for medical reasons / religious services and are infrequent or of short duration when for other reasons). If current  dressing causes regression in wound condition, may D/C ordered dressing product/s and apply Normal Saline Moist Dressing daily until next Wound Healing Center / Other MD appointment. Notify Wound Healing Center of regression in wound condition at [phone]. Please direct any NON-WOUND related issues/requests for orders to patient's Primary Care Physician Wound #3 Left Toe Great: Continue Home Health Visits - [facility] Nurse may visit PRN to address patient s wound care needs. FACE TO FACE ENCOUNTER: MEDICARE and MEDICAID PATIENTS: I certify that this patient is under my care and that I had a face-to-face encounter that meets the physician face-to-face encounter requirements with this patient on this date. The encounter with the patient was in whole or in part for the following MEDICAL CONDITION: (primary reason for Home Healthcare) MEDICAL NECESSITY: I certify, that based on my findings, NURSING services are a medically necessary home health service. HOME BOUND STATUS: I certify that my clinical findings support that this patient is homebound (i.e., Due to illness or injury, pt requires aid of supportive devices such as crutches, cane, wheelchairs, walkers, the use of special transportation or the assistance of another person to leave their place of residence. There is a normal inability to leave the home and doing so requires considerable and taxing effort. Other  absences are for medical reasons / religious services and are infrequent or of short duration when for other reasons). If current dressing causes regression in wound condition, may D/C ordered dressing product/s and apply Normal Saline Moist Dressing daily until next Wound Healing Center / Other MD appointment. Notify Wound Healing Center of regression in wound condition at [phone]. Please direct any NON-WOUND related issues/requests for orders to patient's Primary Care Physician Hyperbaric Oxygen Therapy: Wound #3 Left Toe Great: Indication: - osteomyelitis If appropriate for treatment, begin HBOT per protocol: 2.0 ATA for 90 Minutes without Air Breaks One treatment per day (delivered Monday through Friday unless otherwise specified in Special Instructions below): Total # of Treatments: - 40 Finger stick Blood Glucose Pre- and Post- HBOT Treatment. Follow Hyperbaric Oxygen Glycemia Protocol Other - in chamber TCOM Bastidas, [name] ([id number]) HBO Contraindications: Wound #3 Left Toe Great: HBO contraindications of hyperbaric oxygen therapy were reviewed and the patient found to have no untreated pneumothorax or history of spontaneous pneumothorax. HBO contraindications of hyperbaric oxygen therapy were reviewed and the patient found to have no history of medications such as Bleomycin, Adriamycin, disulfiram, cisplatin and sulfamylon and is not currently receiving any chemotherapy. HBO contraindications of hyperbaric oxygen therapy were reviewed and the patient found to have no Upper respiratory infection and chronic sinusitis. HBO contraindications of hyperbaric oxygen therapy were reviewed and the patient found to have no history of retinal surgery proceeding 6 weeks or intraocular gas HBO contraindications of hyperbaric oxygen therapy were reviewed and the patient found to have no history seizure disorder or any anticonvulsant medication. HBO contraindications of hyperbaric  oxygen therapy were reviewed and the patient found to have no septicemia with CO2 retention. HBO contraindications of hyperbaric oxygen therapy were reviewed and the patient found to have no fever greater than 100 degrees. HBO contraindications of hyperbaric oxygen therapy were reviewed and the patient found to have no pregnancy noted. HBO contraindications of hyperbaric oxygen therapy were reviewed and the patient found to have no medications such as steroids or narcotics or Phenergan. Medications-please add to medication list.: Wound #1 Left Achilles: Santyl Enzymatic Ointment In view of the fact that he is feeling unwell and not up  to staying in the chamber I have agreed that he should skip HBOT today and tomorrow and resume on Monday. Santyl will be applied to the area of his posterior ankle and Prisma to the rest of the wounds. I believe he will benefit from debridement in the OR and application of wound VAC and we will talk to the vascular surgeons when he sees them next. Other than that all questions have been answered and he will come back to see [place] on Monday for HBOT. Electronic Signature(s) Signed: 06/30/2014 4:41:50 PM By: Evlyn Kanner MD, FACS Entered By: Evlyn Kanner on 06/30/2014 09:46:41 Boissonneault, [name] ([id number]) -------------------------------------------------------------------------------- SuperBill Details Patient Name: Gil, Aleister L. Date of Service: 06/30/2014 Medical Record Number: [id number] Patient Account Number: [id number] Date of Birth/Sex: 1920-04-12 (81 y.o. 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 Chronic multifocal osteomyelitis, left ankle and foot Facility Procedures CPT4 Code Description: 78295621 11042 - DEB SUBQ TISSUE 20 SQ CM/< ICD-10 Description Diagnosis E11.621 Type 2 diabetes mellitus with foot ulcer 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 Modifier: of muscle Quantity: 1 CPT4 Code Description: 30865784 11045 - DEB SUBQ TISS EA ADDL 20CM ICD-10 Description Diagnosis E11.52 Type 2 diabetes mellitus with diabetic peripheral angio 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 ([id number]) Modifier: Quantity: 1 Electronic Signature(s) Signed: 06/30/2014 4:41:50 PM By: Evlyn Kanner MD, FACS Entered By: Evlyn Kanner on 06/30/2014 09:47:16

## 2014-07-04 ENCOUNTER — Encounter: Payer: Medicare Other | Admitting: Surgery

## 2014-07-05 ENCOUNTER — Encounter: Payer: Medicare Other | Admitting: Surgery

## 2014-07-05 NOTE — Progress Notes (Signed)
CARI, VOSHELL ([id number]) Visit Report for 06/30/2014 Arrival Information Details Patient Name: [name], [name]. Date of Service: 06/30/2014 8:45 AM Medical Record Number: [id number] Patient Account Number: [id number] Date of Birth/Sex: 03/04/1920 (81 y.o. Male) Treating RN: Huel Coventry Primary Care Physician: Lindwood Qua Other Clinician: Referring Physician: Lindwood Qua Treating Physician/Extender: Rudene Re in Treatment: 9 Visit Information History Since Last Visit Added or deleted any medications: Yes Patient Arrived: Wheel Chair Any new allergies or adverse reactions: No Arrival Time: 09:00 Had a fall or experienced change in No activities of daily living that may affect Accompanied By: son risk of falls: Transfer Assistance: Manual Signs or symptoms of abuse/neglect No Patient Identification Verified: Yes since last visito Secondary Verification Process Yes Hospitalized since last visit: No Completed: Has Dressing in Place as Prescribed: Yes Patient Requires Transmission-Based No Has Footwear/Offloading in Place as Yes Precautions: Prescribed: Patient Has Alerts: No Left: Surgical Shoe with Pressure Relief Insole Pain Present Now: No Electronic Signature(s) Signed: 07/01/2014 4:53:24 PM By: Elliot Gurney, RN, BSN, Kim RN, BSN Entered By: Elliot Gurney, RN, BSN, Kim on 06/30/2014 09:01:05 [name], [name] ([id number]) -------------------------------------------------------------------------------- Encounter Discharge Information Details Patient Name: [name], [name] Date of Service: 06/30/2014 8:45 AM Medical Record Number: [id number] Patient Account Number: [id number] Date of Birth/Sex: 1920/09/07 (81 y.o. Male) Treating RN: Primary Care Physician: Lindwood Qua Other Clinician: Referring Physician: Lindwood Qua Treating Physician/Extender: Rudene Re in Treatment: 9 Encounter Discharge Information Items Discharge Pain Level: 0 Discharge Condition:  Stable Ambulatory Status: Wheelchair Discharge Destination: Home Transportation: Private Auto Accompanied By: son Schedule Follow-up Appointment: Yes Medication Reconciliation completed Yes and provided to Patient/Care Luigi Stuckey: Provided on Clinical Summary of Care: 06/30/2014 Form Type Recipient Paper Patient AE Electronic Signature(s) Signed: 07/01/2014 4:53:24 PM By: Elliot Gurney RN, BSN, Kim RN, BSN Previous Signature: 06/30/2014 9:45:42 AM Version By: Gwenlyn Perking Entered By: Elliot Gurney RN, BSN, Kim on 06/30/2014 09:46:37 [name], [name] ([id number]) -------------------------------------------------------------------------------- Lower Extremity Assessment Details Patient Name: [name], [name] Date of Service: 06/30/2014 8:45 AM Medical Record Number: [id number] Patient Account Number: [id number] Date of Birth/Sex: 1920/10/07 (81 y.o. Male) Treating RN: Huel Coventry Primary Care Physician: Lindwood Qua Other Clinician: Referring Physician: Lindwood Qua Treating Physician/Extender: Rudene Re in Treatment: 9 Vascular Assessment Pulses: Posterior Tibial Dorsalis Pedis Palpable: [Left:No] Doppler: [Left:Monophasic] Extremity colors, hair growth, and conditions: Extremity Color: [Left:Pale] Hair Growth on Extremity: [Left:No] Temperature of Extremity: [Left:Warm] Capillary Refill: [Left:> 3 seconds] Toe Nail Assessment Left: Right: Thick: Yes Discolored: Yes Deformed: Yes Improper Length and Hygiene: Yes Electronic Signature(s) Signed: 07/01/2014 4:53:24 PM By: Elliot Gurney, RN, BSN, Kim RN, BSN Entered By: Elliot Gurney, RN, BSN, Kim on 06/30/2014 09:04:13 [name], [name] ([id number]) -------------------------------------------------------------------------------- Multi Wound Chart Details Patient Name: [name], [name] Date of Service: 06/30/2014 8:45 AM Medical Record Number: [id number] Patient Account Number: [id number] Date of Birth/Sex: 1921/01/11 (81 y.o. 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] Comorbid History: [1:Cataracts, Chronic sinus Cataracts, Chronic sinus Cataracts, Chronic sinus problems/congestion, Congestive Heart Failure, Congestive Heart Failure, Congestive Heart Failure, Coronary  Artery Disease, Coronary Artery Disease, Coronary  Artery Disease, Hypertension, Myocardial Hypertension, Myocardial Hypertension, Myocardial Infarction, Peripheral Arterial Disease, Type II Arterial Disease, Type II Arterial Disease, Type II Diabetes, Neuropathy] [2:problems/congestion, Infarction,  Peripheral Diabetes, Neuropathy] [3:problems/congestion, Infarction, Peripheral Diabetes, Neuropathy] Date Acquired: [1:01/24/2014] [2:01/24/2014] [3:01/24/2014] Weeks of Treatment: [1:9] [2:9] [3:9] Wound Status: [1:Open] [2:Open] [3:Open] Clustered Wound: [1:No] [2:Yes] [3:No] Pending Amputation on Yes [2:No] [3:No] Presentation: Measurements L x W x D 5.7x4.8x0.6 [2:0.8x3.5x0.2] [3:1.2x1x0.2] (cm) Area (cm) : [1:21.488] [2:2.199] [3:0.942] Volume (cm) : [1:12.893] [2:0.44] [3:0.188] % Reduction in Area: [1:13.10%] [2:61.10%] [3:-9.00%] % Reduction in Volume: -73.80% [2:22.10%] [3:-118.60%] Classification: [1:Full Thickness With Exposed Support Structures] [2:Full Thickness Without Exposed  Support Structures] [3:Unclassifiable] HBO Classification: [1:Grade 3] [2:Grade 1] [3:Grade 0] Wagner Verification: [1:Abscess] [2:N/A] [3:N/A] Exudate Amount: [1:Medium] [2:Small] [3:None Present] Exudate Type: Serous Serous N/A Exudate Color: amber amber N/A Wound Margin: Epibole Distinct, outline attached Distinct, outline attached Granulation Amount: Medium (34-66%) Small (1-33%) None Present (0%) Granulation Quality: Pink Pink N/A Necrotic Amount: Medium (34-66%) Medium (34-66%) Large (67-100%) Necrotic Tissue: Eschar, Adherent Slough Adherent Slough Eschar Exposed Structures: Tendon: Yes Fascia: No Fascia: No Fascia: No Fat: No Fat: No Fat: No Tendon: No Tendon: No Muscle: No Muscle: No Muscle: No Joint: No Joint: No Joint: No Bone: No Bone: No Bone: No Limited to Skin Limited to Skin Breakdown Breakdown Epithelialization: None None None Periwound Skin Texture: Edema: No Edema: No Edema: No Excoriation: No Excoriation: No Excoriation: No Induration: No Induration: No Induration: No Callus: No Callus: No Callus: No Crepitus: No Crepitus: No Crepitus: No Fluctuance: No Fluctuance: No Fluctuance: No Friable: No Friable: No Friable: No Rash: No Rash: No Rash: No Scarring: No Scarring: No Scarring: No Periwound Skin Moist: Yes Moist: Yes Dry/Scaly: Yes Moisture: Maceration: No Dry/Scaly: Yes Maceration: No Dry/Scaly: No Maceration: No Moist: No Periwound Skin Color: Erythema: Yes Atrophie Blanche: No Atrophie Blanche: No Rubor: Yes Cyanosis: No Cyanosis: No Atrophie Blanche: No Ecchymosis: No Ecchymosis: No Cyanosis: No Erythema: No Erythema: No Ecchymosis: No Hemosiderin Staining: No Hemosiderin Staining: No Hemosiderin Staining: No Mottled: No Mottled: No Mottled: No Pallor: No Pallor: No Pallor: No Rubor: No Rubor: No Erythema Location: Circumferential N/A N/A Erythema Change: No Change N/A N/A Temperature: No Abnormality  No Abnormality No Abnormality Tenderness on No Yes No Palpation: Wound Preparation: Ulcer Cleansing: Ulcer Cleansing: Ulcer Cleansing: Rinsed/Irrigated with Rinsed/Irrigated with Rinsed/Irrigated with Saline Saline Saline Topical Anesthetic Topical Anesthetic Topical Anesthetic Applied: Other: lidocaine Applied: Other: lidocaine Applied: Other: 4% 4% LIDOCAINE 4% Treatment Notes THANG, FLETT ([id number]) Electronic Signature(s) Signed: 06/30/2014 5:03:06 PM By: Curtis Sites Entered By: Curtis Sites on 06/30/2014 09:19:25 Froh, [name] ([id number]) -------------------------------------------------------------------------------- Multi-Disciplinary Care Plan Details Patient Name: [name], [name] Date of Service: 06/30/2014 8:45 AM Medical Record Number: [id number] Patient Account Number: [id number] Date of Birth/Sex: Oct 14, 1920 (82 y.o. Male) Treating RN: Curtis Sites Primary Care Physician: Lindwood Qua Other Clinician: Referring Physician: Lindwood Qua Treating Physician/Extender: Rudene Re in Treatment: 9 Active Inactive Abuse / Safety / Falls / Self Care Management Nursing Diagnoses: Potential for falls Goals: Patient will remain injury free Date Initiated: 04/28/2014 Goal Status: Active Interventions: Assess fall risk on admission and as needed Notes: Necrotic Tissue Nursing Diagnoses: Impaired tissue integrity related to necrotic/devitalized tissue Goals: Necrotic/devitalized tissue will be minimized in the wound bed Date Initiated: 04/28/2014 Goal Status: Active Interventions: Provide education on necrotic tissue and debridement process Treatment Activities: Apply topical anesthetic as ordered : 06/30/2014 Notes: Nutrition Nursing  Diagnoses: Potential for alteratiion in Nutrition/Potential for imbalanced nutrition [name], [name] ([id number]) Goals: Patient/caregiver agrees to and verbalizes understanding of need to use nutritional  supplements and/or vitamins as prescribed Date Initiated: 04/28/2014 Goal Status: Active Interventions: Assess patient nutrition upon admission and as needed per policy Notes: Orientation to the Wound Care Program Nursing Diagnoses: Knowledge deficit related to the wound healing center program Goals: Patient/caregiver will verbalize understanding of the Wound Healing Center Program Date Initiated: 04/28/2014 Goal Status: Active Interventions: Provide education on orientation to the wound center Notes: Wound/Skin Impairment Nursing Diagnoses: Impaired tissue integrity Goals: Ulcer/skin breakdown will have a volume reduction of 30% by week 4 Date Initiated: 04/28/2014 Goal Status: Active Interventions: Assess ulceration(s) every visit Notes: Electronic Signature(s) Signed: 06/30/2014 5:03:06 PM By: Curtis Sites Entered By: Curtis Sites on 06/30/2014 09:19:16 [name], [name] ([id number]) -------------------------------------------------------------------------------- Pain Assessment Details Patient Name: [name], [name] Date of Service: 06/30/2014 8:45 AM Medical Record Number: [id number] Patient Account Number: [id number] Date of Birth/Sex: 06-21-1920 (81 y.o. Male) Treating RN: Huel Coventry Primary Care Physician: Lindwood Qua Other Clinician: Referring Physician: Lindwood Qua Treating Physician/Extender: Rudene Re in Treatment: 9 Active Problems Location of Pain Severity and Description of Pain Patient Has Paino No Site Locations Pain Management and Medication Current Pain Management: Electronic Signature(s) Signed: 07/01/2014 4:53:24 PM By: Elliot Gurney, RN, BSN, Kim RN, BSN Entered By: Elliot Gurney, RN, BSN, Kim on 06/30/2014 09:01:11 [name] ([id number]) -------------------------------------------------------------------------------- Patient/Caregiver Education Details Patient Name: Zale, Maximilien L. Date of Service: 06/30/2014 8:45 AM Medical Record Number:  [id number] Patient Account Number: [id number] Date of Birth/Gender: 1920-12-25 (81 y.o. Male) Treating RN: Curtis Sites Primary Care Physician: Lindwood Qua Other Clinician: Referring Physician: Lindwood Qua Treating Physician/Extender: Rudene Re in Treatment: 9 Education Assessment Education Provided To: Patient and Caregiver Education Topics Provided Nutrition: Handouts: Other: continue antibiotics per Dr Sampson Goon Methods: Explain/Verbal Responses: State content correctly Electronic Signature(s) Signed: 06/30/2014 5:03:06 PM By: Curtis Sites Entered By: Curtis Sites on 06/30/2014 09:35:44 [name], [name] [name] ([id number]) -------------------------------------------------------------------------------- Wound Assessment Details Patient Name: Murren, Vashon L. Date of Service: 06/30/2014 8:45 AM Medical Record Number: [id number] Patient Account Number: [id number] Date of Birth/Sex: 09-Jan-1921 (81 y.o. Male) Treating RN: Huel Coventry Primary Care Physician: Lindwood Qua Other Clinician: Referring Physician: Lindwood Qua Treating Physician/Extender: Rudene Re in Treatment: 9 Wound Status Wound Number: 1 Primary Arterial Insufficiency Ulcer Etiology: Wound Location: Left Achilles Wound Open Wounding Event: Gradually Appeared Status: Date Acquired: 01/24/2014 Comorbid Cataracts, Chronic sinus Weeks Of Treatment: 9 History: problems/congestion, Congestive Heart Clustered Wound: No Failure, Coronary Artery Disease, Pending Amputation On Presentation Hypertension, Myocardial Infarction, Peripheral Arterial Disease, Type II Diabetes, Neuropathy Photos Photo Uploaded By: Elliot Gurney, RN, BSN, Kim on 06/30/2014 10:02:58 Wound Measurements Length: (cm) 5.7 Width: (cm) 4.8 Depth: (cm) 0.6 Area: (cm) 21.488 Volume: (cm) 12.893 % Reduction in Area: 13.1% % Reduction in Volume: -73.8% Epithelialization: None Wound Description Classification: Foul Odor  Aft Thurow, Sal L. ([id number]) er Cleansing: No Full Thickness With Exposed Support Structures Diabetic Severity Grade 3 (Wagner): [address] Verification: Abscess Wound Margin: Epibole Exudate Amount: Medium Exudate Type: Serous Exudate Color: amber Wound Bed Granulation Amount: Medium (34-66%) Exposed Structure Granulation Quality: Pink Fascia Exposed: No Necrotic Amount: Medium (34-66%) Fat Layer Exposed: No Necrotic Quality: Eschar, Adherent Slough Tendon Exposed: Yes Muscle Exposed: No Joint Exposed: No Bone Exposed: No Periwound Skin Texture Texture Color No Abnormalities Noted: No No Abnormalities Noted: No Callus: No Atrophie Blanche: No Crepitus: No Cyanosis: No Excoriation: No Ecchymosis:  No Fluctuance: No Erythema: Yes Friable: No Erythema Location: Circumferential Induration: No Erythema Change: No Change Localized Edema: No Hemosiderin Staining: No Rash: No Mottled: No Scarring: No Pallor: No Rubor: Yes Moisture No Abnormalities Noted: No Temperature / Pain Dry / Scaly: No Temperature: No Abnormality Maceration: No Moist: Yes Wound Preparation Ulcer Cleansing: Rinsed/Irrigated with Saline Topical Anesthetic Applied: Other: lidocaine 4%, Treatment Notes Wound #1 (Left Achilles) 1. Cleansed with: Clean wound with Normal Saline 2. Anesthetic Oetken, Deshone L. ([id number]) Topical Lidocaine 4% cream to wound bed prior to debridement 4. Dressing Applied: Santyl Ointment 5. Secondary Dressing Applied ABD and Kerlix/Conform Notes stretch netting Electronic Signature(s) Signed: 07/01/2014 4:53:24 PM By: Elliot Gurney, RN, BSN, Kim RN, BSN Entered By: Elliot Gurney, RN, BSN, Kim on 06/30/2014 09:08:13 Capri, [name] ([id number]) -------------------------------------------------------------------------------- Wound Assessment Details Patient Name: Minniefield, Orlie L. Date of Service: 06/30/2014 8:45 AM Medical Record Number: [id number] Patient Account Number:  [id number] Date of Birth/Sex: [birth year] (81 y.o. Male) Treating RN: Huel Coventry Primary Care Physician: Lindwood Qua Other Clinician: Referring Physician: Lindwood Qua Treating Physician/Extender: Rudene Re in Treatment: 9 Wound Status Wound Number: 2 Primary Arterial Insufficiency Ulcer Etiology: Wound Location: Left Calcaneous - Lateral Wound Open Wounding Event: Gradually Appeared Status: Date Acquired: 01/24/2014 Comorbid Cataracts, Chronic sinus Weeks Of Treatment: 9 History: problems/congestion, Congestive Heart Clustered Wound: Yes Failure, Coronary Artery Disease, Hypertension, Myocardial Infarction, Peripheral Arterial Disease, Type II Diabetes, Neuropathy Photos Photo Uploaded By: Elliot Gurney, RN, BSN, Kim on 06/30/2014 10:02:59 Wound Measurements Length: (cm) 0.8 Width: (cm) 3.5 Depth: (cm) 0.2 Area: (cm) 2.199 Volume: (cm) 0.44 % Reduction in Area: 61.1% % Reduction in Volume: 22.1% Epithelialization: None Wound Description Full Thickness Without Classification: Exposed Support Structures Diabetic Severity Grade 1 (Wagner): Wound Margin: Distinct, outline attached Exudate Amount: Small Exudate Type: Serous Exudate Color: amber Macintyre, Elaine L. ([id number]) Foul Odor After Cleansing: No Wound Bed Granulation Amount: Small (1-33%) Exposed Structure Granulation Quality: Pink Fascia Exposed: No Necrotic Amount: Medium (34-66%) Fat Layer Exposed: No Necrotic Quality: Adherent Slough Tendon Exposed: No Muscle Exposed: No Joint Exposed: No Bone Exposed: No Limited to Skin Breakdown Periwound Skin Texture Texture Color No Abnormalities Noted: No No Abnormalities Noted: No Callus: No Atrophie Blanche: No Crepitus: No Cyanosis: No Excoriation: No Ecchymosis: No Fluctuance: No Erythema: No Friable: No Hemosiderin Staining: No Induration: No Mottled: No Localized Edema: No Pallor: No Rash: No Rubor: No Scarring: No Temperature /  Pain Moisture Temperature: No Abnormality No Abnormalities Noted: No Tenderness on Palpation: Yes Dry / Scaly: Yes Maceration: No Moist: Yes Wound Preparation Ulcer Cleansing: Rinsed/Irrigated with Saline Topical Anesthetic Applied: Other: lidocaine 4%, Treatment Notes Wound #2 (Left, Lateral Calcaneous) 1. Cleansed with: Clean wound with Normal Saline 2. Anesthetic Topical Lidocaine 4% cream to wound bed prior to debridement 4. Dressing Applied: Prisma Ag 5. Secondary Dressing Applied Gauze and Kerlix/Conform 7. Secured with Tape Notes PRANIT, OWENSBY ([id number]) stretch net Electronic Signature(s) Signed: 07/01/2014 4:53:24 PM By: Elliot Gurney, RN, BSN, Kim RN, BSN Entered By: Elliot Gurney, RN, BSN, Kim on 06/30/2014 09:08:55 Zumbro, [name] ([id number]) -------------------------------------------------------------------------------- Wound Assessment Details Patient Name: Red, Arvon L. Date of Service: 06/30/2014 8:45 AM Medical Record Number: [id number] Patient Account Number: [id number] Date of Birth/Sex: May 17, 1920 (81 y.o. Male) Treating RN: Huel Coventry Primary Care Physician: Lindwood Qua Other Clinician: Referring Physician: Lindwood Qua Treating Physician/Extender: Rudene Re in Treatment: 9 Wound Status Wound Number: 3 Primary Arterial Insufficiency Ulcer Etiology: Wound Location: Left Toe Great Wound Open Wounding Event: Gradually Appeared  Status: Date Acquired: 01/24/2014 Comorbid Cataracts, Chronic sinus Weeks Of Treatment: 9 History: problems/congestion, Congestive Heart Clustered Wound: No Failure, Coronary Artery Disease, Hypertension, Myocardial Infarction, Peripheral Arterial Disease, Type II Diabetes, Neuropathy Photos Photo Uploaded By: Elliot Gurney, RN, BSN, Kim on 06/30/2014 10:03:16 Wound Measurements Length: (cm) 1.2 Width: (cm) 1 Depth: (cm) 0.2 Area: (cm) 0.942 Volume: (cm) 0.188 % Reduction in Area: -9% % Reduction in Volume:  -118.6% Epithelialization: None Wound Description Classification: Unclassifiable Diabetic Severity [address]): Grade 0 Wound Margin: Distinct, outline attached Exudate Amount: None Present Foul Odor After Cleansing: No Wound Bed Granulation Amount: None Present (0%) Exposed Structure Necrotic Amount: Large (67-100%) Fascia Exposed: No Aitken, Orley L. ([id number]) Necrotic Quality: Eschar Fat Layer Exposed: No Tendon Exposed: No Muscle Exposed: No Joint Exposed: No Bone Exposed: No Limited to Skin Breakdown Periwound Skin Texture Texture Color No Abnormalities Noted: No No Abnormalities Noted: No Callus: No Atrophie Blanche: No Crepitus: No Cyanosis: No Excoriation: No Ecchymosis: No Fluctuance: No Erythema: No Friable: No Hemosiderin Staining: No Induration: No Mottled: No Localized Edema: No Pallor: No Rash: No Rubor: No Scarring: No Temperature / Pain Moisture Temperature: No Abnormality No Abnormalities Noted: No Dry / Scaly: Yes Maceration: No Moist: No Wound Preparation Ulcer Cleansing: Rinsed/Irrigated with Saline Topical Anesthetic Applied: Other: LIDOCAINE 4%, Treatment Notes Wound #3 (Left Toe Great) 1. Cleansed with: Clean wound with Normal Saline 2. Anesthetic Topical Lidocaine 4% cream to wound bed prior to debridement 4. Dressing Applied: Prisma Ag 5. Secondary Dressing Applied Gauze and Kerlix/Conform 7. Secured with Tape Notes stretch net Electronic Signature(s) Signed: 07/01/2014 4:53:24 PM By: Elliot Gurney, RN, BSN, Kim RN, BSN [address] [place] [name] ([id number]) Entered By: Elliot Gurney RN, BSN, Kim on 06/30/2014 09:09:16 Mangieri, [name] ([id number]) -------------------------------------------------------------------------------- Vitals Details Patient Name: Dasaro, Devarion L. Date of Service: 06/30/2014 8:45 AM Medical Record Number: [id number] Patient Account Number: [id number] Date of Birth/Sex: 12-24-1920 (81 y.o. Male) Treating RN: Huel Coventry Primary Care Physician: Lindwood Qua Other Clinician: Referring Physician: Lindwood Qua Treating Physician/Extender: Rudene Re in Treatment: 9 Vital Signs Time Taken: 09:01 Temperature (F): 97.7 Height (in): 70 Pulse (bpm): 71 Weight (lbs): 200 Respiratory Rate (breaths/min): 18 Body Mass Index (BMI): 28.7 Blood Pressure (mmHg): 112/36 Capillary Blood Glucose (mg/dl): 132 Reference Range: 80 - 120 mg / dl Electronic Signature(s) Signed: 07/01/2014 4:53:24 PM By: Elliot Gurney, RN, BSN, Kim RN, BSN Entered By: Elliot Gurney, RN, BSN, Kim on 06/30/2014 09:15:16

## 2014-07-06 ENCOUNTER — Encounter: Payer: Medicare Other | Admitting: Surgery

## 2014-07-06 ENCOUNTER — Encounter: Payer: Self-pay | Admitting: Internal Medicine

## 2014-07-07 ENCOUNTER — Encounter: Payer: Medicare Other | Admitting: Surgery

## 2014-07-07 DIAGNOSIS — L97323 Non-pressure chronic ulcer of left ankle with necrosis of muscle: Secondary | ICD-10-CM | POA: Diagnosis not present

## 2014-07-08 ENCOUNTER — Encounter: Payer: Medicare Other | Admitting: Surgery

## 2014-07-08 NOTE — Progress Notes (Signed)
SAMIEL, PEEL ([id number]) Visit Report for 07/07/2014 Arrival Information Details Patient Name: [name], [name]. Date of Service: 07/07/2014 8:45 AM Medical Record Number: [id number] Patient Account Number: [id number] Date of Birth/Sex: Mar 10, 1920 (81 y.o. Male) Treating RN: Curtis Sites Primary Care Physician: Lindwood Qua Other Clinician: Referring Physician: Lindwood Qua Treating Physician/Extender: Rudene Re in Treatment: 10 Visit Information History Since Last Visit Added or deleted any medications: No Patient Arrived: Wheel Chair Any new allergies or adverse reactions: No Arrival Time: 09:04 Had a fall or experienced change in No activities of daily living that may affect Accompanied By: son risk of falls: Transfer Assistance: None Signs or symptoms of abuse/neglect since last No Patient Identification Verified: Yes visito Secondary Verification Process Yes Hospitalized since last visit: No Completed: Pain Present Now: No Patient Requires Transmission-Based No Precautions: Patient Has Alerts: No Electronic Signature(s) Signed: 07/07/2014 5:00:03 PM By: Curtis Sites Entered By: Curtis Sites on 07/07/2014 09:11:46 [name] ([id number]) -------------------------------------------------------------------------------- Encounter Discharge Information Details Patient Name: Sofranko, [name] Date of Service: 07/07/2014 8:45 AM Medical Record Number: [id number] Patient Account Number: [id number] Date of Birth/Sex: March 23, 1920 (81 y.o. Male) Treating RN: Primary Care Physician: Lindwood Qua Other Clinician: Referring Physician: Lindwood Qua Treating Physician/Extender: Rudene Re in Treatment: 10 Encounter Discharge Information Items Discharge Pain Level: 0 Discharge Condition: Stable Ambulatory Status: Wheelchair Discharge Destination: Home Transportation: Private Auto Accompanied By: son Schedule Follow-up Appointment:  Yes Medication Reconciliation completed and provided to Patient/Care No [name]: Provided on Clinical Summary of Care: 07/07/2014 Form Type Recipient Paper Patient AE Electronic Signature(s) Signed: 07/07/2014 1:09:57 PM By: Curtis Sites Previous Signature: 07/07/2014 9:52:41 AM Version By: Gwenlyn Perking Entered By: Curtis Sites on 07/07/2014 13:09:57 [name] ([id number]) -------------------------------------------------------------------------------- Lower Extremity Assessment Details Patient Name: Stacey, Dashiel L. Date of Service: 07/07/2014 8:45 AM Medical Record Number: [id number] Patient Account Number: [id number] Date of Birth/Sex: 1920-03-21 (81 y.o. Male) Treating RN: Curtis Sites Primary Care Physician: Lindwood Qua Other Clinician: Referring Physician: Lindwood Qua Treating Physician/Extender: Rudene Re in Treatment: 10 Vascular Assessment Pulses: Posterior Tibial Palpable: [Left:No] Doppler: [Left:Monophasic] Dorsalis Pedis Palpable: [Left:No] Doppler: [Left:Monophasic] Extremity colors, hair growth, and conditions: Extremity Color: [Left:Pale] Hair Growth on Extremity: [Left:No] Temperature of Extremity: [Left:Cool] Capillary Refill: [Left:< 3 seconds] Toe Nail Assessment Left: Right: Thick: Yes Discolored: Yes Deformed: Yes Improper Length and Hygiene: No Electronic Signature(s) Signed: 07/07/2014 5:00:03 PM By: Curtis Sites Entered By: Curtis Sites on 07/07/2014 09:18:03 Barella, [name] ([id number]) -------------------------------------------------------------------------------- Multi Wound Chart Details Patient Name: Chalker, Tamar L. Date of Service: 07/07/2014 8:45 AM Medical Record Number: [id number] Patient Account Number: [id number] Date of Birth/Sex: 07-06-1920 (81 y.o. 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] Comorbid History: [1:Cataracts, Chronic sinus Cataracts, Chronic sinus Cataracts, Chronic sinus problems/congestion, Congestive Heart Failure, Congestive Heart Failure, Congestive Heart Failure, Coronary Artery Disease, Coronary Artery Disease, Coronary  Artery Disease, Hypertension, Myocardial Hypertension, Myocardial Hypertension, Myocardial Infarction, Peripheral Arterial Disease, Type II Arterial Disease, Type II Arterial Disease, Type II Diabetes, Neuropathy] [2:problems/congestion, Infarction,  Peripheral Diabetes, Neuropathy] [3:problems/congestion, Infarction,  Peripheral Diabetes, Neuropathy] Date Acquired: [1:01/24/2014] [2:01/24/2014] [3:01/24/2014] Weeks of Treatment: [1:10] [2:10] [3:10] Wound Status: [1:Open] [2:Open] [3:Open] Clustered Wound: [1:No] [2:Yes] [3:No] Pending Amputation on Yes [2:No] [3:No] Presentation: Measurements L x W x D 7.1x4.2x0.5 [2:0.8x3.4x0.2] [3:1x1x0.1] (cm) Area (cm) : [1:23.421] [2:2.136] [3:0.785] Volume (cm) : [1:11.71] [2:0.427] [3:0.079] % Reduction in Area: [1:5.30%] [2:62.20%] [3:9.10%] % Reduction in Volume: -57.80% [2:24.40%] [3:8.10%] Classification: [1:Full Thickness With Exposed Support Structures] [2:Full Thickness Without Exposed Support Structures] [3:Unclassifiable] HBO Classification: [1:Grade 3] [2:Grade 1] [3:Grade 0] Wagner Verification: [1:Abscess] [2:N/A] [3:N/A] Exudate Amount:  [1:Medium] [2:Small] [3:None Present] Exudate Type: Serous Serous N/A Exudate Color: amber amber N/A Wound Margin: Epibole Distinct, outline attached Distinct, outline attached Granulation Amount: Medium (34-66%) Medium (34-66%) None Present (0%) Granulation Quality: Pink Pink N/A Necrotic Amount: Medium (34-66%) Medium (34-66%) Large (67-100%) Necrotic Tissue: Eschar, Adherent Slough Adherent Slough Eschar Exposed Structures: Tendon: Yes Fascia: No Fascia: No Fascia: No Fat: No Fat: No Fat: No Tendon: No Tendon: No Muscle: No Muscle: No Muscle: No Joint: No Joint: No Joint: No Bone: No Bone: No Bone: No Limited to Skin Limited to Skin Breakdown Breakdown Epithelialization: None Small (1-33%) None Periwound Skin Texture: Edema: No Edema: No Edema: No Excoriation: No Excoriation: No Excoriation: No Induration: No Induration: No Induration: No Callus: No Callus: No Callus: No Crepitus: No Crepitus: No Crepitus: No Fluctuance: No Fluctuance: No Fluctuance: No Friable: No Friable: No Friable: No Rash: No Rash: No Rash: No Scarring: No Scarring: No Scarring: No Periwound Skin Moist: Yes Moist: Yes Dry/Scaly: Yes Moisture: Maceration: No Dry/Scaly: Yes Maceration: No Dry/Scaly: No Maceration: No Moist: No Periwound Skin Color: Erythema: Yes Atrophie Blanche: No Atrophie Blanche: No Rubor: Yes Cyanosis: No Cyanosis: No Atrophie Blanche: No Ecchymosis: No Ecchymosis: No Cyanosis: No Erythema: No Erythema: No Ecchymosis: No Hemosiderin Staining: No Hemosiderin Staining: No Hemosiderin Staining: No Mottled: No Mottled: No Mottled: No Pallor: No Pallor: No Pallor: No Rubor: No Rubor: No Erythema Location: Circumferential N/A N/A Erythema Change: No Change N/A N/A Temperature: No Abnormality No Abnormality No Abnormality Tenderness on No Yes No Palpation: Wound Preparation: Ulcer Cleansing: Ulcer Cleansing: Ulcer  Cleansing: Rinsed/Irrigated with Rinsed/Irrigated with Rinsed/Irrigated with Saline Saline Saline Topical Anesthetic Topical Anesthetic Topical Anesthetic Applied: Other: lidocaine Applied: Other: lidocaine Applied: Other: 4% 4% LIDOCAINE 4% Treatment Notes NELL, SCHRACK ([id number]) Electronic Signature(s) Signed: 07/07/2014 5:00:03 PM By: Curtis Sites Entered By: Curtis Sites on 07/07/2014 09:18:51 Huegel, [name] ([id number]) -------------------------------------------------------------------------------- Multi-Disciplinary Care Plan Details Patient Name: Bertz, [name] Date of Service: 07/07/2014 8:45 AM Medical Record Number: [id number] Patient Account Number: [id number] Date of Birth/Sex: February 17, 1920 (81 y.o. Male) Treating RN: Curtis Sites Primary Care Physician: Lindwood Qua Other Clinician: Referring Physician: Lindwood Qua Treating Physician/Extender: Rudene Re in Treatment: 10 Active Inactive Abuse / Safety / Falls / Self Care Management Nursing Diagnoses: Potential for falls Goals: Patient will remain injury free Date Initiated: 04/28/2014 Goal Status: Active Interventions: Assess fall risk on admission and as needed Notes: Necrotic Tissue Nursing Diagnoses: Impaired tissue integrity related to necrotic/devitalized tissue Goals: Necrotic/devitalized tissue will be minimized in the wound bed Date Initiated: 04/28/2014 Goal Status: Active Interventions: Provide education on necrotic tissue and debridement process Treatment Activities: Apply topical anesthetic as ordered : 07/07/2014 Notes: Nutrition Nursing Diagnoses: Potential for alteratiion in Nutrition/Potential for imbalanced nutrition Balsley, [name] ([id number]) Goals: Patient/caregiver agrees to and verbalizes understanding of need to use nutritional supplements and/or vitamins as prescribed Date Initiated: 04/28/2014 Goal Status: Active Interventions: Assess  patient nutrition  upon admission and as needed per policy Notes: Orientation to the Wound Care Program Nursing Diagnoses: Knowledge deficit related to the wound healing center program Goals: Patient/caregiver will verbalize understanding of the Wound Healing Center Program Date Initiated: 04/28/2014 Goal Status: Active Interventions: Provide education on orientation to the wound center Notes: Wound/Skin Impairment Nursing Diagnoses: Impaired tissue integrity Goals: Ulcer/skin breakdown will have a volume reduction of 30% by week 4 Date Initiated: 04/28/2014 Goal Status: Active Interventions: Assess ulceration(s) every visit Notes: Electronic Signature(s) Signed: 07/07/2014 5:00:03 PM By: Curtis Sites Entered By: Curtis Sites on 07/07/2014 09:18:13 Camerer, [name] ([id number]) -------------------------------------------------------------------------------- Patient/Caregiver Education Details Patient Name: Tippen, Densil L. Date of Service: 07/07/2014 8:45 AM Medical Record Number: [id number] Patient Account Number: [id number] Date of Birth/Gender: 06-26-1920 (81 y.o. Male) Treating RN: Curtis Sites Primary Care Physician: Lindwood Qua Other Clinician: Referring Physician: Lindwood Qua Treating Physician/Extender: Rudene Re in Treatment: 10 Education Assessment Education Provided To: Patient and Caregiver Education Topics Provided Wound/Skin Impairment: Handouts: Other: wound care to continue as ordered Methods: Demonstration, Explain/Verbal Responses: State content correctly Electronic Signature(s) Signed: 07/07/2014 5:00:03 PM By: Curtis Sites Entered By: Curtis Sites on 07/07/2014 09:25:57 Coudriet, [name] ([id number]) -------------------------------------------------------------------------------- Wound Assessment Details Patient Name: Dicocco, [name] Date of Service: 07/07/2014 8:45 AM Medical Record Number: [id number] Patient Account Number: [id number] Date of  Birth/Sex: September 25, 1920 (81 y.o. Male) Treating RN: Curtis Sites Primary Care Physician: Lindwood Qua Other Clinician: Referring Physician: Lindwood Qua Treating Physician/Extender: Rudene Re in Treatment: 10 Wound Status Wound Number: 1 Primary Arterial Insufficiency Ulcer Etiology: Wound Location: Left Achilles Wound Open Wounding Event: Gradually Appeared Status: Date Acquired: 01/24/2014 Comorbid Cataracts, Chronic sinus Weeks Of Treatment: 10 History: problems/congestion, Congestive Heart Clustered Wound: No Failure, Coronary Artery Disease, Pending Amputation On Presentation Hypertension, Myocardial Infarction, Peripheral Arterial Disease, Type II Diabetes, Neuropathy Photos Photo Uploaded By: Curtis Sites on 07/07/2014 16:24:45 Wound Measurements Length: (cm) 7.1 Width: (cm) 4.2 Depth: (cm) 0.5 Area: (cm) 23.421 Volume: (cm) 11.71 % Reduction in Area: 5.3% % Reduction in Volume: -57.8% Epithelialization: None Tunneling: No Undermining: No Wound Description Full Thickness With Exposed Foul Odor Af Classification: Support Structures Diabetic Severity Grade 3 (Wagner): Wagner Verification: Abscess Wound Margin: Epibole Exudate Amount: Medium Exudate Type: Serous Mitton, [name] ([id number]) ter Cleansing: No Exudate Color: amber Wound Bed Granulation Amount: Medium (34-66%) Exposed Structure Granulation Quality: Pink Fascia Exposed: No Necrotic Amount: Medium (34-66%) Fat Layer Exposed: No Necrotic Quality: Eschar, Adherent Slough Tendon Exposed: Yes Muscle Exposed: No Joint Exposed: No Bone Exposed: No Periwound Skin Texture Texture Color No Abnormalities Noted: No No Abnormalities Noted: No Callus: No Atrophie Blanche: No Crepitus: No Cyanosis: No Excoriation: No Ecchymosis: No Fluctuance: No Erythema: Yes Friable: No Erythema Location: Circumferential Induration: No Erythema Change: No Change Localized Edema:  No Hemosiderin Staining: No Rash: No Mottled: No Scarring: No Pallor: No Rubor: Yes Moisture No Abnormalities Noted: No Temperature / Pain Dry / Scaly: No Temperature: No Abnormality Maceration: No Moist: Yes Wound Preparation Ulcer Cleansing: Rinsed/Irrigated with Saline Topical Anesthetic Applied: Other: lidocaine 4%, Treatment Notes Wound #1 (Left Achilles) 1. Cleansed with: Clean wound with Normal Saline 2. Anesthetic Topical Lidocaine 4% cream to wound bed prior to debridement 4. Dressing Applied: Santyl Ointment 5. Secondary Dressing Applied ABD and Kerlix/Conform 7. Secured with Tape Notes RAY, GERVASI. ([id number]) stretch netting Electronic Signature(s) Signed: 07/07/2014 5:00:03 PM By: Curtis Sites Entered By: Curtis Sites on 07/07/2014 09:14:30 Faires, [name] ([id number]) -------------------------------------------------------------------------------- Wound  Assessment Details Patient Name: Torbert, Glenden L. Date of Service: 07/07/2014 8:45 AM Medical Record Number: [id number] Patient Account Number: [id number] Date of Birth/Sex: 1920/06/06 (81 y.o. Male) Treating RN: Curtis Sites Primary Care Physician: Lindwood Qua Other Clinician: Referring Physician: Lindwood Qua Treating Physician/Extender: Rudene Re in Treatment: 10 Wound Status Wound Number: 2 Primary Arterial Insufficiency Ulcer Etiology: Wound Location: Left Calcaneous - Lateral Wound Open Wounding Event: Gradually Appeared Status: Date Acquired: 01/24/2014 Comorbid Cataracts, Chronic sinus Weeks Of Treatment: 10 History: problems/congestion, Congestive Heart Clustered Wound: Yes Failure, Coronary Artery Disease, Hypertension, Myocardial Infarction, Peripheral Arterial Disease, Type II Diabetes, Neuropathy Photos Photo Uploaded By: Curtis Sites on 07/07/2014 16:25:30 Wound Measurements Length: (cm) 0.8 Width: (cm) 3.4 Depth: (cm) 0.2 Area: (cm)  2.136 Volume: (cm) 0.427 % Reduction in Area: 62.2% % Reduction in Volume: 24.4% Epithelialization: Small (1-33%) Tunneling: No Undermining: No Wound Description Full Thickness Without Foul Odor Aft Classification: Exposed Support Structures Diabetic Severity Grade 1 (Wagner): Wound Margin: Distinct, outline attached Exudate Amount: Small Exudate Type: Serous Exudate Color: amber Walski, [name] ([id number]) er Cleansing: No Wound Bed Granulation Amount: Medium (34-66%) Exposed Structure Granulation Quality: Pink Fascia Exposed: No Necrotic Amount: Medium (34-66%) Fat Layer Exposed: No Necrotic Quality: Adherent Slough Tendon Exposed: No Muscle Exposed: No Joint Exposed: No Bone Exposed: No Limited to Skin Breakdown Periwound Skin Texture Texture Color No Abnormalities Noted: No No Abnormalities Noted: No Callus: No Atrophie Blanche: No Crepitus: No Cyanosis: No Excoriation: No Ecchymosis: No Fluctuance: No Erythema: No Friable: No Hemosiderin Staining: No Induration: No Mottled: No Localized Edema: No Pallor: No Rash: No Rubor: No Scarring: No Temperature / Pain Moisture Temperature: No Abnormality No Abnormalities Noted: No Tenderness on Palpation: Yes Dry / Scaly: Yes Maceration: No Moist: Yes Wound Preparation Ulcer Cleansing: Rinsed/Irrigated with Saline Topical Anesthetic Applied: Other: lidocaine 4%, Treatment Notes Wound #2 (Left, Lateral Calcaneous) 1. Cleansed with: Clean wound with Normal Saline 2. Anesthetic Topical Lidocaine 4% cream to wound bed prior to debridement 4. Dressing Applied: Aquacel Ag 5. Secondary Dressing Applied Gauze and Kerlix/Conform 7. Secured with [place]) GEFF, MANKIN ([id number]) Signed: 07/07/2014 5:00:03 PM By: Curtis Sites Entered By: Curtis Sites on 07/07/2014 09:14:52 [name], [name]  ([id number]) -------------------------------------------------------------------------------- Wound Assessment Details Patient Name: Buehl, [name] Date of Service: 07/07/2014 8:45 AM Medical Record Number: [id number] Patient Account Number: [id number] Date of Birth/Sex: 11-14-1920 (81 y.o. Male) Treating RN: Curtis Sites Primary Care Physician: Lindwood Qua Other Clinician: Referring Physician: Lindwood Qua Treating Physician/Extender: Rudene Re in Treatment: 10 Wound Status Wound Number: 3 Primary Arterial Insufficiency Ulcer Etiology: Wound Location: Left Toe Great Wound Open Wounding Event: Gradually Appeared Status: Date Acquired: 01/24/2014 Comorbid Cataracts, Chronic sinus Weeks Of Treatment: 10 History: problems/congestion, Congestive Heart Clustered Wound: No Failure, Coronary Artery Disease, Hypertension, Myocardial Infarction, Peripheral Arterial Disease, Type II Diabetes, Neuropathy Photos Photo Uploaded By: Curtis Sites on 07/07/2014 16:25:58 Wound Measurements Length: (cm) 1 Width: (cm) 1 Depth: (cm) 0.1 Area: (cm) 0.785 Volume: (cm) 0.079 % Reduction in Area: 9.1% % Reduction in Volume: 8.1% Epithelialization: None Tunneling: No Undermining: No Wound Description Classification: Unclassifiable Foul O Diabetic Severity (Wagner): Grade 0 Wound Margin: Distinct, outline attached Exudate Amount: None Present dor After Cleansing: No Wound Bed Granulation Amount: None Present (0%) Exposed Structure Necrotic Amount: Large (67-100%) Fascia Exposed: No Ess, [name] ([id number]) Necrotic Quality: Eschar Fat Layer Exposed: No Tendon Exposed: No Muscle Exposed: No Joint Exposed: No Bone Exposed: No Limited to Skin Breakdown  Periwound Skin Texture Texture Color No Abnormalities Noted: No No Abnormalities Noted: No Callus: No Atrophie Blanche: No Crepitus: No Cyanosis: No Excoriation: No Ecchymosis: No Fluctuance:  No Erythema: No Friable: No Hemosiderin Staining: No Induration: No Mottled: No Localized Edema: No Pallor: No Rash: No Rubor: No Scarring: No Temperature / Pain Moisture Temperature: No Abnormality No Abnormalities Noted: No Dry / Scaly: Yes Maceration: No Moist: No Wound Preparation Ulcer Cleansing: Rinsed/Irrigated with Saline Topical Anesthetic Applied: Other: LIDOCAINE 4%, Treatment Notes Wound #3 (Left Toe Great) 1. Cleansed with: Clean wound with Normal Saline 2. Anesthetic Topical Lidocaine 4% cream to wound bed prior to debridement 4. Dressing Applied: Aquacel Ag 5. Secondary Dressing Applied Gauze and Kerlix/Conform 7. Secured with Secretary/administrator) Signed: 07/07/2014 5:00:03 PM By: Curtis Sites Entered By: Curtis Sites on 07/07/2014 09:15:06 Muratore, [name] ([id number]) Fentress, [name] ([id number]) -------------------------------------------------------------------------------- Vitals Details Patient Name: Yeldell, [name] Date of Service: 07/07/2014 8:45 AM Medical Record Number: [id number] Patient Account Number: [id number] Date of Birth/Sex: 31-Mar-1920 (81 y.o. Male) Treating RN: Curtis Sites Primary Care Physician: Lindwood Qua Other Clinician: Referring Physician: Lindwood Qua Treating Physician/Extender: Rudene Re in Treatment: 10 Vital Signs Time Taken: 09:15 Temperature (F): 96.9 Height (in): 70 Pulse (bpm): 77 Weight (lbs): 200 Respiratory Rate (breaths/min): 18 Body Mass Index (BMI): 28.7 Blood Pressure (mmHg): 108/47 Reference Range: 80 - 120 mg / dl Electronic Signature(s) Signed: 07/07/2014 5:00:03 PM By: Curtis Sites Entered By: Curtis Sites on 07/07/2014 09:15:32

## 2014-07-08 NOTE — Progress Notes (Signed)
[name], NIPP ([id number]) Visit Report for 07/07/2014 Chief Complaint Document Details Patient Name: Stapel, [name] Date of Service: 07/07/2014 8:45 AM Medical Record Number: [id number] Patient Account Number: [id number] Date of Birth/Sex: 1920/05/07 (81 y.o. Male) Treating RN: Primary Care Physician: Lindwood Qua Other Clinician: Referring Physician: Lindwood Qua Treating Physician/Extender: Rudene Re in Treatment: 10 Information Obtained from: Patient Chief Complaint Patient presents to the wound care center for a consult due non healing wound 81 year old gentleman who comes with a history of having some ulcerated areas on his heels since February 2016 and then a large injury to his left posterior heel and ankle since about a month. Electronic Signature(s) Signed: 07/07/2014 1:17:45 PM By: Evlyn Kanner MD, FACS Entered By: Evlyn Kanner on 07/07/2014 09:39:31 [name] ([id number]) -------------------------------------------------------------------------------- Debridement Details Patient Name: [name], [name] Date of Service: 07/07/2014 8:45 AM Medical Record Number: [id number] Patient Account Number: [id number] Date of Birth/Sex: 10-24-1920 (81 y.o. Male) Treating RN: Primary Care Physician: Lindwood Qua Other Clinician: Referring Physician: Lindwood Qua Treating Physician/Extender: Rudene Re in Treatment: 10 Debridement Performed for Wound #1 Left Achilles Assessment: Performed By: Physician Tristan Schroeder., MD Debridement: Debridement Pre-procedure Yes Verification/Time Out Taken: Start Time: 09:29 Pain Control: Lidocaine 4% Topical Solution Level: Skin/Subcutaneous Tissue/Muscle Total Area Debrided (L x 7.1 (cm) x 4.2 (cm) = 29.82 (cm) W): Tissue and other Viable, Non-Viable, Fibrin/Slough, Muscle, Subcutaneous, Tendon material debrided: Instrument: Curette, Forceps, Scissors Bleeding: Minimum Hemostasis Achieved:  Pressure End Time: 09:33 Procedural Pain: 0 Post Procedural Pain: 0 Response to Treatment: Procedure was tolerated well Post Debridement Measurements of Total Wound Length: (cm) 7.1 Width: (cm) 4.2 Depth: (cm) 0.5 Volume: (cm) 11.71 Electronic Signature(s) Signed: 07/07/2014 1:17:45 PM By: Evlyn Kanner MD, FACS Entered By: Evlyn Kanner on 07/07/2014 09:38:34 [name] ([id number]) -------------------------------------------------------------------------------- Debridement Details Patient Name: [name], [name] Date of Service: 07/07/2014 8:45 AM Medical Record Number: [id number] Patient Account Number: [id number] Date of Birth/Sex: 1920-10-22 (81 y.o. Male) Treating RN: Primary Care Physician: Lindwood Qua Other Clinician: Referring Physician: Lindwood Qua Treating Physician/Extender: Rudene Re in Treatment: 10 Debridement Performed for Wound #2 Left,Lateral Calcaneous Assessment: Performed By: Physician Tristan Schroeder., MD Debridement: Debridement Pre-procedure Yes Verification/Time Out Taken: Start Time: 09:27 Pain Control: Lidocaine 4% Topical Solution Level: Skin/Subcutaneous Tissue Total Area Debrided (L x 0.8 (cm) x 3.4 (cm) = 2.72 (cm) W): Tissue and other Viable, Non-Viable, Fibrin/Slough, Subcutaneous material debrided: Instrument: Curette Bleeding: Minimum Hemostasis Achieved: Pressure End Time: 09:29 Procedural Pain: 0 Post Procedural Pain: 0 Response to Treatment: Procedure was tolerated well Post Debridement Measurements of Total Wound Length: (cm) 0.8 Width: (cm) 3.4 Depth: (cm) 0.2 Volume: (cm) 0.427 Electronic Signature(s) Signed: 07/07/2014 1:17:45 PM By: Evlyn Kanner MD, FACS Entered By: Evlyn Kanner on 07/07/2014 09:38:59 [name] ([id number]) -------------------------------------------------------------------------------- Debridement Details Patient Name: [name], [name] Date of Service: 07/07/2014 8:45  AM Medical Record Number: [id number] Patient Account Number: [id number] Date of Birth/Sex: 1920/10/05 (81 y.o. Male) Treating RN: Primary Care Physician: Lindwood Qua Other Clinician: Referring Physician: Lindwood Qua Treating Physician/Extender: Rudene Re in Treatment: 10 Debridement Performed for Wound #3 Left Toe Great Assessment: Performed By: Physician Tristan Schroeder., MD Debridement: Debridement Pre-procedure Yes Verification/Time Out Taken: Start Time: 09:26 Pain Control: Lidocaine 4% Topical Solution Level: Skin/Subcutaneous Tissue Total Area Debrided (L x 1 (cm) x 1 (cm) = 1 (cm) W): Tissue and other Viable, Non-Viable, Eschar, Fibrin/Slough, Subcutaneous material debrided: Instrument: Curette Bleeding: Minimum Hemostasis Achieved: Pressure End  Time: 09:27 Procedural Pain: 0 Post Procedural Pain: 0 Response to Treatment: Procedure was tolerated well Post Debridement Measurements of Total Wound Length: (cm) 1 Width: (cm) 1 Depth: (cm) 0.1 Volume: (cm) 0.079 Electronic Signature(s) Signed: 07/07/2014 1:17:45 PM By: Evlyn Kanner MD, FACS Entered By: Evlyn Kanner on 07/07/2014 09:39:24 [name] ([id number]) -------------------------------------------------------------------------------- HPI Details Patient Name: [name], [name] Date of Service: 07/07/2014 8:45 AM Medical Record Number: [id number] Patient Account Number: [id number] Date of Birth/Sex: 1920-06-19 (81 y.o. Male) Treating RN: Primary Care Physician: Lindwood Qua Other Clinician: Referring Physician: Lindwood Qua Treating Physician/Extender: Rudene Re in Treatment: 10 History of Present Illness HPI Description: 81 year old gentleman who was known to be a diabetic for many years has peripheral neuropathy recently went to his primary care doctor for a punctured wound on his left heel which was something he had noticed. The patient then was referred to a  podiatrist who referred him to Dr. Wyn Quaker in the vascular surgery department and I understand the procedure was done on 03/14/2014 with a left lower extremity vascular procedure and stenting was done. Details of this are not available at the present time. The patient has been applying Neosporin to his leg and has not had any wound care addressed so far. patient has also had a history of coronary artery disease in the past and hasn't had a CABG and a pacemaker placement in the remote past.Other details and notes are pending. the patient is not in pain lives alone and has some home health and other aides coming to help him with his daily chores and meals. reviewing the vascular notes I understand the procedure was done on 03/14/2014 and he was operated by Dr. Wyn Quaker. he had a catheter placement to the left peroneal artery and a aortogram and selective left lower extremity angiogram was done. He also had a percutaneous transluminal angioplasty of the left peroneal artery and the tibioperoneal trunk. The distal SFA and above-knee popliteal artery were also angioplastied. A subcutaneous stent placement to the distal superficial femoral artery was also done. 05/05/2014 -- the patient has not had any change in his health and after much consideration is decided that he does not want HBOT as he is claustrophobic and was unable to tolerate being in the chamber for 90 minutes. I have discussed with him that his most recent x-ray of the foot shows that he has the distal phalanx of the left great toe showing changes with osteomyelitis cannot be excluded at that site. He tells me that he cannot have an MRI because of his defibrillator and hence we will order a triple phase bone scan. I have also reviewed his culture report which shows several organisms and he has sensitivity to tetracycline and we have recommended he takes this for 14 days and this has been given to him on 05/02/2014 05/12/2014 the bone scan done  on 05/10/2014 shows #1 findings are worrisome for osteomyelitis involving the calcaneus of the left foot, #2 increase uptake localizing to the left second and third toe on all 3 phases. Cannot rule out osteomyelitis in this area. And #3 left foot cellulitis. 05/19/2014 -- reviewed several reports which we have received back on [name]. #1 chest x-ray done on April 21 shows chronic changes in the left base no acute findings. #2 his CBC is within normal limits. #3 hemoglobin A1c was 8.5%. #4 EKG done on 26 April was within normal limits due to his electronic ventricular pacemaker. 05/26/2014 -- he has had his  PICC line placed and is taking vancomycin daily basis. He is to start hyperbaric oxygen therapy on this coming Monday. 06/09/2014 -- he saw Dr. Sampson Goon yesterday and besides his IV antibiotic I believe a oral antibiotic was [name], [name]. ([id number]) also given and this may be Cipro. Patient is feeling fine otherwise does not have any symptoms though his blood pressure this morning in the wound center has been 90/50. We will check his blood pressure in the supine position. 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 Complained of worsening fatigue yesterday after HBO. Feels better today. Has appointment with PCP this afternoon. He wishes to hold off on HBO until next week. No significant pain. No fever or chills. Stable drainage. 06/23/2014 Bravery has taken a break from HBO T and has been feeling a little better. He was seen by the nurse practitioner at his PCPs office and at that time his vitals were stable and they did get some EKG done which was within normal limits. There have sent out some lab work which we still have to receive reports. He is going to see his PCP back this afternoon and will be seeing Dr. Sampson Goon tomorrow for review. addendum: we have received labs from his PCPs office and note that his potassium is high at 5.4 and  his BUN/creatinine is slightly raised. His blood sugar was 216. His total protein and albumen were 5.9 and 3 and this was low. His HandH was 10.3 and 31.4 WBC was 8.2 and his platelets were 304. CRP was 29.6 which was high. Vancomycin trough was 19.1 which was normal TSH was 3.11 which was normal 06/30/2014 -- [name] feels weak today and he feels his stomach is acting up and his not feeling up to doing hyperbaric oxygen therapy. He would like to take a break today and tomorrow and resume on Monday. He is on vancomycin and Levaquin as per ID and he has an appointment to see the vascular surgeons in about 10 days' time. 07/07/2014 -- [name] has been feeling weak all along and he has decided that he is not going to continue with hyperbaric oxygen therapy and a longer period. He had finished 16 of his 40 treatments and will not take anymore. He has an appointment with ID tomorrow and also his vascular surgeons and I will have a conference with both of them regarding his further care. Electronic Signature(s) Signed: 07/07/2014 1:17:45 PM By: Evlyn Kanner MD, FACS Entered By: Evlyn Kanner on 07/07/2014 09:41:11 [name] ([id number]) -------------------------------------------------------------------------------- Physical Exam Details Patient Name: Maddix, Awad L. Date of Service: 07/07/2014 8:45 AM Medical Record Number: [id number] Patient Account Number: [id number] Date of Birth/Sex: [birth year] (81 y.o. Male) Treating RN: Primary Care Physician: Lindwood Qua Other Clinician: Referring Physician: Lindwood Qua Treating Physician/Extender: Rudene Re in Treatment: 10 Constitutional . Pulse regular. Respirations normal and unlabored. Afebrile. . Eyes Nonicteric. Reactive to light. Ears, Nose, Mouth, and Throat Lips, teeth, and gums WNL.Marland Kitchen Moist mucosa without lesions . Neck supple and nontender. No palpable supraclavicular or cervical adenopathy. Normal sized without  goiter. Respiratory WNL. No retractions.. Cardiovascular no palpable pedal pulses. No clubbing, cyanosis or edema. Integumentary (Hair, Skin) his wound continues to have slough and the ankle area its down to the calcaneal bone and the Achilles tendon.. No crepitus or fluctuance. No peri-wound warmth or erythema. No masses.Marland Kitchen Psychiatric Judgement and insight Intact.. No evidence of depression, anxiety, or agitation.. Electronic Signature(s) Signed: 07/07/2014 1:17:45 PM By:  Evlyn Kanner MD, FACS Entered By: Evlyn Kanner on 07/07/2014 09:41:52 Derflinger, [name] ([id number]) -------------------------------------------------------------------------------- Physician Orders Details Patient Name: Steinkamp, Elisandro L. Date of Service: 07/07/2014 8:45 AM Medical Record Number: [id number] Patient Account Number: [id number] Date of Birth/Sex: Oct 31, 1920 (81 y.o. Male) Treating RN: Curtis Sites Primary Care Physician: Lindwood Qua Other Clinician: Referring Physician: Lindwood Qua Treating Physician/Extender: Rudene Re in Treatment: 10 Verbal / Phone Orders: Yes Clinician: Curtis Sites Read Back and Verified: Yes Diagnosis Coding Wound Cleansing Wound #1 Left Achilles o Clean wound with Normal Saline. o May shower with protection. Wound #2 Left,Lateral Calcaneous o Clean wound with Normal Saline. o May shower with protection. Wound #3 Left Toe Great o Clean wound with Normal Saline. o May shower with protection. Anesthetic Wound #1 Left Achilles o Topical Lidocaine 4% cream applied to wound bed prior to debridement Wound #2 Left,Lateral Calcaneous o Topical Lidocaine 4% cream applied to wound bed prior to debridement Wound #3 Left Toe Great o Topical Lidocaine 4% cream applied to wound bed prior to debridement Skin Barriers/Peri-Wound Care Wound #1 Left Achilles o Skin Prep Wound #2 Left,Lateral Calcaneous o Skin Prep Wound #3 Left Toe  Great o Skin Prep Primary Wound Dressing Wound #1 Left Achilles o Santyl Ointment Donigan, [name] ([id number]) Wound #2 Left,Lateral Calcaneous o Aquacel Ag Wound #3 Left Toe Great o Aquacel Ag Secondary Dressing Wound #1 Left Achilles o Gauze, ABD and Kerlix/Conform Wound #2 Left,Lateral Calcaneous o Gauze, ABD and Kerlix/Conform Wound #3 Left Toe Great o Gauze, ABD and Kerlix/Conform Dressing Change Frequency Wound #1 Left Achilles o Change dressing every day. Wound #2 Left,Lateral Calcaneous o Change dressing every day. Wound #3 Left Toe Great o Change dressing every day. Follow-up Appointments Wound #1 Left Achilles o Return Appointment in 1 week. Wound #2 Left,Lateral Calcaneous o Return Appointment in 1 week. Wound #3 Left Toe Great o Return Appointment in 1 week. Additional Orders / Instructions Wound #1 Left Achilles o Increase protein intake. o Other: - goal is to keep blood sugars under 180 Wound #2 Left,Lateral Calcaneous o Increase protein intake. o Other: - goal is to keep blood sugars under 180 Wound #3 Left Toe Great o Increase protein intake. ANAS, REISTER ([id number]) o Other: - goal is to keep blood sugars under 180 Home Health Wound #1 Left Achilles o Continue Home Health Visits - [facility] o Home Health Nurse may visit PRN to address patientos wound care needs. o FACE TO FACE ENCOUNTER: MEDICARE and MEDICAID PATIENTS: I certify that this patient is under my care and that I had a face-to-face encounter that meets the physician face-to-face encounter requirements with this patient on this date. The encounter with the patient was in whole or in part for the following MEDICAL CONDITION: (primary reason for Home Healthcare) MEDICAL NECESSITY: I certify, that based on my findings, NURSING services are a medically necessary home health service. HOME BOUND STATUS: I certify that my clinical  findings support that this patient is homebound (i.e., Due to illness or injury, pt requires aid of supportive devices such as crutches, cane, wheelchairs, walkers, the use of special transportation or the assistance of another person to leave their place of residence. There is a normal inability to leave the home and doing so requires considerable and taxing effort. Other absences are for medical reasons / religious services and are infrequent or of short duration when for other reasons). o If current dressing causes regression in wound condition, may D/C  ordered dressing product/s and apply Normal Saline Moist Dressing daily until next Wound Healing Center / Other MD appointment. Notify Wound Healing Center of regression in wound condition at [phone]. o Please direct any NON-WOUND related issues/requests for orders to patient's Primary Care Physician Wound #2 Left,Lateral Calcaneous o Continue Home Health Visits - [facility] o Home Health Nurse may visit PRN to address patientos wound care needs. o FACE TO FACE ENCOUNTER: MEDICARE and MEDICAID PATIENTS: I certify that this patient is under my care and that I had a face-to-face encounter that meets the physician face-to-face encounter requirements with this patient on this date. The encounter with the patient was in whole or in part for the following MEDICAL CONDITION: (primary reason for Home Healthcare) MEDICAL NECESSITY: I certify, that based on my findings, NURSING services are a medically necessary home health service. HOME BOUND STATUS: I certify that my clinical findings support that this patient is homebound (i.e., Due to illness or injury, pt requires aid of supportive devices such as crutches, cane, wheelchairs, walkers, the use of special transportation or the assistance of another person to leave their place of residence. There is a normal inability to leave the home and doing so requires considerable and  taxing effort. Other absences are for medical reasons / religious services and are infrequent or of short duration when for other reasons). o If current dressing causes regression in wound condition, may D/C ordered dressing product/s and apply Normal Saline Moist Dressing daily until next Wound Healing Center / Other MD appointment. Notify Wound Healing Center of regression in wound condition at [phone]. o Please direct any NON-WOUND related issues/requests for orders to patient's Primary Care Physician Wound #3 Left Toe Doretha Sou Continue Home Health Visits - [facility] o Home Health Nurse may visit PRN to address patientos wound care needs. MASATO, PETTIE ([id number]) o FACE TO FACE ENCOUNTER: MEDICARE and MEDICAID PATIENTS: I certify that this patient is under my care and that I had a face-to-face encounter that meets the physician face-to-face encounter requirements with this patient on this date. The encounter with the patient was in whole or in part for the following MEDICAL CONDITION: (primary reason for Home Healthcare) MEDICAL NECESSITY: I certify, that based on my findings, NURSING services are a medically necessary home health service. HOME BOUND STATUS: I certify that my clinical findings support that this patient is homebound (i.e., Due to illness or injury, pt requires aid of supportive devices such as crutches, cane, wheelchairs, walkers, the use of special transportation or the assistance of another person to leave their place of residence. There is a normal inability to leave the home and doing so requires considerable and taxing effort. Other absences are for medical reasons / religious services and are infrequent or of short duration when for other reasons). o If current dressing causes regression in wound condition, may D/C ordered dressing product/s and apply Normal Saline Moist Dressing daily until next Wound Healing Center / Other  MD appointment. Notify Wound Healing Center of regression in wound condition at [phone]. o Please direct any NON-WOUND related issues/requests for orders to patient's Primary Care Physician Hyperbaric Oxygen Therapy Wound #3 Left Toe Great o Discontinue HBO Therapy - per patient request Medications-please add to medication list. Wound #1 Left Achilles o Santyl Enzymatic Ointment Electronic Signature(s) Signed: 07/07/2014 1:17:45 PM By: Evlyn Kanner MD, FACS Signed: 07/07/2014 5:00:03 PM By: Curtis Sites Entered By: Curtis Sites on 07/07/2014 09:37:21 Monteforte, Rome L. ([id number]) --------------------------------------------------------------------------------  Problem List Details Patient Name: Kleinfelter, Chiron L. Date of Service: 07/07/2014 8:45 AM Medical Record Number: [id number] Patient Account Number: [id number] Date of Birth/Sex: [birth year] (81 y.o. 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 Date of Service: 07/07/2014  8:45 AM Medical Record Number: [id number] Patient Account Number: [id number] Date of Birth/Sex: [birth year] (81 y.o. Male) Treating RN: Primary Care Physician: Lindwood Qua Other Clinician: Referring Physician: Lindwood Qua Treating Physician/Extender: Rudene Re in Treatment: 10 Subjective Chief Complaint Information obtained from Patient Patient presents to the wound care center for a consult due non healing wound 81 year old gentleman who comes with a history of having some ulcerated areas on his heels since February 2016 and then a large injury to his left posterior heel and ankle since about a month. History of Present Illness (HPI) 81 year old gentleman who was known to be a diabetic for many years has peripheral neuropathy recently went to his primary care doctor for a punctured wound on his left heel which was something he had noticed. The patient then was referred to a podiatrist who referred him to Dr. Wyn Quaker in the vascular surgery department and I understand the procedure was done on 03/14/2014 with a left lower extremity vascular procedure and stenting was done. Details of this are not available at the present time. The patient has been applying Neosporin to his leg and has not had any wound care addressed so far. patient has also had a history of coronary artery disease in the past and hasn't had a CABG and a pacemaker placement in the remote past.Other details and notes are pending. the patient is not in pain lives alone and has some home health and other aides coming to help him with his daily chores and meals. reviewing the vascular notes I understand the procedure was done on 03/14/2014 and he was operated by Dr. Wyn Quaker. he had a catheter placement to the left peroneal artery and a aortogram and selective left lower extremity angiogram was done. He also had a percutaneous transluminal angioplasty of the left peroneal artery and the tibioperoneal trunk. The distal  SFA and above-knee popliteal artery were also angioplastied. A subcutaneous stent placement to the distal superficial femoral artery was also done. 05/05/2014 -- the patient has not had any change in his health and after much consideration is decided that he does not want HBOT as he is claustrophobic and was unable to tolerate being in the chamber for 90 minutes. I have discussed with him that his most recent x-ray of the foot shows that he has the distal phalanx of the left great toe showing changes with osteomyelitis cannot be excluded at that site. He tells me that he cannot have an MRI because of his defibrillator and hence we will order a triple phase bone scan. I have  also reviewed his culture report which shows several organisms and he has sensitivity to tetracycline and we have recommended he takes this for 14 days and this has been given to him on 05/02/2014 05/12/2014 the bone scan done on 05/10/2014 shows #1 findings are worrisome for osteomyelitis involving the calcaneus of the left foot, #2 increase uptake localizing to the left second and third toe on all 3 phases. Cannot rule out osteomyelitis in this area. And #3 left foot cellulitis. 05/19/2014 -- reviewed several reports which we have received back on [name]. #1 chest x-ray done on April 21 shows chronic changes in the left base no acute findings. Sayler, Maurion L. ([id number]) #2 his CBC is within normal limits. #3 hemoglobin A1c was 8.5%. #4 EKG done on 26 April was within normal limits due to his electronic ventricular pacemaker. 05/26/2014 -- he has had his PICC line placed and is taking vancomycin daily basis. He is to start hyperbaric oxygen therapy on this coming Monday. 06/09/2014 -- he saw Dr. Sampson Goon yesterday and besides his IV antibiotic I believe a oral antibiotic was also given and this may be Cipro. Patient is feeling fine otherwise does not have any symptoms though his blood pressure this morning in  the wound center has been 90/50. We will check his blood pressure in the supine position. 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 Complained of worsening fatigue yesterday after HBO. Feels better today. Has appointment with PCP this afternoon. He wishes to hold off on HBO until next week. No significant pain. No fever or chills. Stable drainage. 06/23/2014 Niven has taken a break from HBO T and has been feeling a little better. He was seen by the nurse practitioner at his PCPs office and at that time his vitals were stable and they did get some EKG done which was within normal limits. There have sent out some lab work which we still have to receive reports. He is going to see his PCP back this afternoon and will be seeing Dr. Sampson Goon tomorrow for review. addendum: we have received labs from his PCPs office and note that his potassium is high at 5.4 and his BUN/creatinine is slightly raised. His blood sugar was 216. His total protein and albumen were 5.9 and 3 and this was low. His HandH was 10.3 and 31.4 WBC was 8.2 and his platelets were 304. CRP was 29.6 which was high. Vancomycin trough was 19.1 which was normal TSH was 3.11 which was normal 06/30/2014 -- [name] feels weak today and he feels his stomach is acting up and his not feeling up to doing hyperbaric oxygen therapy. He would like to take a break today and tomorrow and resume on Monday. He is on vancomycin and Levaquin as per ID and he has an appointment to see the vascular surgeons in about 10 days' time. 07/07/2014 -- [name] has been feeling weak all along and he has decided that he is not going to continue with hyperbaric oxygen therapy and a longer period. He had finished 16 of his 40 treatments and will not take anymore. He has an appointment with ID tomorrow and also his vascular surgeons and I will have a conference with both of them regarding his further  care. Objective Constitutional Pulse regular. Respirations normal and unlabored. Afebrile. Vitals Time Taken: 9:15 AM, Height: 70 in, Weight: 200 lbs, BMI: 28.7, Temperature: 96.9 F, Pulse: 77 bpm, Respiratory Rate: 18 breaths/min, Blood Pressure: 108/47 mmHg. Slocumb, Soledad  L. ([id number]) Eyes Nonicteric. Reactive to light. Ears, Nose, Mouth, and Throat Lips, teeth, and gums WNL.Marland Kitchen Moist mucosa without lesions . Neck supple and nontender. No palpable supraclavicular or cervical adenopathy. Normal sized without goiter. Respiratory WNL. No retractions.. Cardiovascular no palpable pedal pulses. No clubbing, cyanosis or edema. Psychiatric Judgement and insight Intact.. No evidence of depression, anxiety, or agitation.. Integumentary (Hair, Skin) his wound continues to have slough and the ankle area its down to the calcaneal bone and the Achilles tendon.. No crepitus or fluctuance. No peri-wound warmth or erythema. No masses.. Wound #1 status is Open. Original cause of wound was Gradually Appeared. The wound is located on the Left Achilles. The wound measures 7.1cm length x 4.2cm width x 0.5cm depth; 23.421cm^2 area and 11.71cm^3 volume. There is tendon exposed. There is no tunneling or undermining noted. There is a medium amount of serous drainage noted. The wound margin is epibole. There is medium (34-66%) pink granulation within the wound bed. There is a medium (34-66%) amount of necrotic tissue within the wound bed including Eschar and Adherent Slough. The periwound skin appearance exhibited: Moist, Rubor, Erythema. The periwound skin appearance did not exhibit: Callus, Crepitus, Excoriation, Fluctuance, Friable, Induration, Localized Edema, Rash, Scarring, Dry/Scaly, Maceration, Atrophie Blanche, Cyanosis, Ecchymosis, Hemosiderin Staining, Mottled, Pallor. The surrounding wound skin color is noted with erythema which is circumferential. Periwound temperature was noted as No  Abnormality. Wound #2 status is Open. Original cause of wound was Gradually Appeared. The wound is located on the Left,Lateral Calcaneous. The wound measures 0.8cm length x 3.4cm width x 0.2cm depth; 2.136cm^2 area and 0.427cm^3 volume. The wound is limited to skin breakdown. There is no tunneling or undermining noted. There is a small amount of serous drainage noted. The wound margin is distinct with the outline attached to the wound base. There is medium (34-66%) pink granulation within the wound bed. There is a medium (34-66%) amount of necrotic tissue within the wound bed including Adherent Slough. The periwound skin appearance exhibited: Dry/Scaly, Moist. The periwound skin appearance did not exhibit: Callus, Crepitus, Excoriation, Fluctuance, Friable, Induration, Localized Edema, Rash, Scarring, Maceration, Atrophie Blanche, Cyanosis, Ecchymosis, Hemosiderin Staining, Mottled, Pallor, Rubor, Erythema. Periwound temperature was noted as No Abnormality. The periwound has tenderness on palpation. Wound #3 status is Open. Original cause of wound was Gradually Appeared. The wound is located on the Left Toe Great. The wound measures 1cm length x 1cm width x 0.1cm depth; 0.785cm^2 area and Payer, Josph L. ([id number]) 0.079cm^3 volume. The wound is limited to skin breakdown. There is no tunneling or undermining noted. There is a none present amount of drainage noted. The wound margin is distinct with the outline attached to the wound base. There is no granulation within the wound bed. There is a large (67-100%) amount of necrotic tissue within the wound bed including Eschar. The periwound skin appearance exhibited: Dry/Scaly. The periwound skin appearance did not exhibit: Callus, Crepitus, Excoriation, Fluctuance, Friable, Induration, Localized Edema, Rash, Scarring, Maceration, Moist, Atrophie Blanche, Cyanosis, Ecchymosis, Hemosiderin Staining, Mottled, Pallor, Rubor, Erythema. Periwound  temperature was noted as No Abnormality. 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 His antibiotics are still continuing both IV and orally and Dr. Sampson Goon and I will have a discussion regarding this. In the meanwhile we will continue with Silver alginate to the toes and the lateral calcaneum and Santyl to the posterior ankle region. Come back and see me next Thursday for a decision regarding further care. 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 There was a Skin/Subcutaneous Tissue/Muscle Debridement ([id number]) debridement with total area of 29.82 sq cm performed by Tristan Schroeder., MD. with the following instrument(s): Curette, Forceps, and Scissors to remove Viable and Non- Viable tissue/material including Fibrin/Slough, Muscle, Tendon, and Subcutaneous after achieving pain control using Lidocaine 4% Topical Solution. A time out was conducted prior to the start of the procedure. A Minimum amount of bleeding was controlled with Pressure. The procedure was tolerated well with a pain level of 0 throughout and a pain level of 0 following the procedure. Post Debridement Measurements: 7.1cm length x 4.2cm width x 0.5cm depth; 11.71cm^3 volume. Mood, Barton L. ([id number]) Wound #2 Wound #2 is an Arterial Insufficiency Ulcer located on the Left,Lateral Calcaneous . There was a Skin/Subcutaneous Tissue  Debridement ([id number]) debridement with total area of 2.72 sq cm performed by Tristan Schroeder., MD. with the following instrument(s): Curette to remove Viable and Non-Viable tissue/material including Fibrin/Slough and Subcutaneous after achieving pain control using Lidocaine 4% Topical Solution. A time out was conducted prior to the start of the procedure. A Minimum amount of bleeding was controlled with Pressure. The procedure was tolerated well with a pain level of 0 throughout and a pain level of 0 following the procedure. Post Debridement Measurements: 0.8cm length x 3.4cm width x 0.2cm depth; 0.427cm^3 volume. Wound #3 Wound #3 is an Arterial Insufficiency Ulcer located on the Left Toe Great . There was a Skin/Subcutaneous Tissue Debridement ([id number]) debridement with total area of 1 sq cm performed by Samuele Storey, Ignacia Felling., MD. with the following instrument(s): Curette to remove Viable and Non-Viable tissue/material including Fibrin/Slough, Eschar, and Subcutaneous after achieving pain control using Lidocaine 4% Topical Solution. A time out was conducted prior to the start of the procedure. A Minimum amount of bleeding was controlled with Pressure. The procedure was tolerated well with a pain level of 0 throughout and a pain level of 0 following the procedure. Post Debridement Measurements: 1cm length x 1cm width x 0.1cm depth; 0.079cm^3 volume. Plan Wound Cleansing: Wound #1 Left Achilles: Clean wound with Normal Saline. May shower with protection. Wound #2 Left,Lateral Calcaneous: Clean wound with Normal Saline. May shower with protection. Wound #3 Left Toe Great: Clean wound with Normal Saline. May shower with protection. Anesthetic: Wound #1 Left Achilles: Topical Lidocaine 4% cream applied to wound bed prior to debridement Wound #2 Left,Lateral Calcaneous: Topical Lidocaine 4% cream applied to wound bed prior to debridement Wound #3 Left Toe Great: Topical Lidocaine 4%  cream applied to wound bed prior to debridement Skin Barriers/Peri-Wound Care: Wound #1 Left Achilles: Skin Prep Wound #2 Left,Lateral Calcaneous: Skin Prep Wound #3 Left Toe Great: Skin Prep Rothman, Lane L. ([id number]) Primary Wound Dressing: Wound #1 Left Achilles: Santyl Ointment Wound #2 Left,Lateral Calcaneous: Aquacel Ag Wound #3 Left Toe Great: Aquacel Ag Secondary Dressing: Wound #1 Left Achilles: Gauze, ABD and Kerlix/Conform Wound #2 Left,Lateral Calcaneous: Gauze, ABD and Kerlix/Conform Wound #3 Left Toe  Great: Gauze, ABD and Kerlix/Conform Dressing Change Frequency: Wound #1 Left Achilles: Change dressing every day. Wound #2 Left,Lateral Calcaneous: Change dressing every day. Wound #3 Left Toe Great: Change dressing every day. Follow-up Appointments: Wound #1 Left Achilles: Return Appointment in 1 week. Wound #2 Left,Lateral Calcaneous: Return Appointment in 1 week. Wound #3 Left Toe Great: Return Appointment in 1 week. Additional Orders / Instructions: Wound #1 Left Achilles: Increase protein intake. Other: - goal is to keep blood sugars under 180 Wound #2 Left,Lateral Calcaneous: Increase protein intake. Other: - goal is to keep blood sugars under 180 Wound #3 Left Toe Great: Increase protein intake. Other: - goal is to keep blood sugars under 180 Home Health: Wound #1 Left Achilles: Continue Home Health Visits - [facility] Nurse may visit PRN to address patient s wound care needs. FACE TO FACE ENCOUNTER: MEDICARE and MEDICAID PATIENTS: I certify that this patient is under my care and that I had a face-to-face encounter that meets the physician face-to-face encounter requirements with this patient on this date. The encounter with the patient was in whole or in part for the following MEDICAL CONDITION: (primary reason for Home Healthcare) MEDICAL NECESSITY: I certify, that based on my findings, NURSING services are a medically  necessary home health service. HOME BOUND STATUS: I certify that my clinical findings support that this patient is homebound (i.e., Due to illness or injury, pt requires aid of supportive devices such as crutches, cane, wheelchairs, walkers, the use of special transportation or the assistance of another person to leave their place of residence. There is a normal inability to leave the home and doing so requires considerable and taxing effort. Other absences are Deeg, Lucian L. ([id number]) for medical reasons / religious services and are infrequent or of short duration when for other reasons). If current dressing causes regression in wound condition, may D/C ordered dressing product/s and apply Normal Saline Moist Dressing daily until next Wound Healing Center / Other MD appointment. Notify Wound Healing Center of regression in wound condition at [phone]. Please direct any NON-WOUND related issues/requests for orders to patient's Primary Care Physician Wound #2 Left,Lateral Calcaneous: Continue Home Health Visits - [facility] Nurse may visit PRN to address patient s wound care needs. FACE TO FACE ENCOUNTER: MEDICARE and MEDICAID PATIENTS: I certify that this patient is under my care and that I had a face-to-face encounter that meets the physician face-to-face encounter requirements with this patient on this date. The encounter with the patient was in whole or in part for the following MEDICAL CONDITION: (primary reason for Home Healthcare) MEDICAL NECESSITY: I certify, that based on my findings, NURSING services are a medically necessary home health service. HOME BOUND STATUS: I certify that my clinical findings support that this patient is homebound (i.e., Due to illness or injury, pt requires aid of supportive devices such as crutches, cane, wheelchairs, walkers, the use of special transportation or the assistance of another person to leave their place of residence.  There is a normal inability to leave the home and doing so requires considerable and taxing effort. Other absences are for medical reasons / religious services and are infrequent or of short duration when for other reasons). If current dressing causes regression in wound condition, may D/C ordered dressing product/s and apply Normal Saline Moist Dressing daily until next Wound Healing Center / Other MD appointment. Notify Wound Healing Center of regression in wound condition at [phone]. Please direct any NON-WOUND  related issues/requests for orders to patient's Primary Care Physician Wound #3 Left Toe Great: Continue Home Health Visits - [facility] Nurse may visit PRN to address patient s wound care needs. FACE TO FACE ENCOUNTER: MEDICARE and MEDICAID PATIENTS: I certify that this patient is under my care and that I had a face-to-face encounter that meets the physician face-to-face encounter requirements with this patient on this date. The encounter with the patient was in whole or in part for the following MEDICAL CONDITION: (primary reason for Home Healthcare) MEDICAL NECESSITY: I certify, that based on my findings, NURSING services are a medically necessary home health service. HOME BOUND STATUS: I certify that my clinical findings support that this patient is homebound (i.e., Due to illness or injury, pt requires aid of supportive devices such as crutches, cane, wheelchairs, walkers, the use of special transportation or the assistance of another person to leave their place of residence. There is a normal inability to leave the home and doing so requires considerable and taxing effort. Other absences are for medical reasons / religious services and are infrequent or of short duration when for other reasons). If current dressing causes regression in wound condition, may D/C ordered dressing product/s and apply Normal Saline Moist Dressing daily until next Wound  Healing Center / Other MD appointment. Notify Wound Healing Center of regression in wound condition at [phone]. Please direct any NON-WOUND related issues/requests for orders to patient's Primary Care Physician Hyperbaric Oxygen Therapy: Wound #3 Left Toe Great: Discontinue HBO Therapy - per patient request Medications-please add to medication list.: Wound #1 Left Achilles: Santyl Enzymatic Ointment Cremeans, Henryk L. ([id number]) I will have a conference with his ID doctor and the vascular surgeon and we may need to talk about going back to the OR for a extensive debridement and application for wound VAC. His antibiotics are still continuing both IV and orally and Dr. Sampson Goon and I will have a discussion regarding this. In the meanwhile we will continue with Silver alginate to the toes and the lateral calcaneum and Santyl to the posterior ankle region. Come back and see me next Thursday for a decision regarding further care. Electronic Signature(s) Signed: 07/07/2014 1:17:45 PM By: Evlyn Kanner MD, FACS Entered By: Evlyn Kanner on 07/07/2014 09:43:51 [place]field, [name] ([id number]) -------------------------------------------------------------------------------- SuperBill Details Patient Name: Unangst, Naresh L. Date of Service: 07/07/2014 Medical Record Number: [id number] Patient Account Number: [id number] Date of Birth/Sex: April 01, 1920 (81 y.o. 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 M86.372 Chronic  multifocal osteomyelitis, left ankle and foot Facility Procedures CPT4: Description Modifier Quantity Code [id number] 11042 - DEB SUBQ TISSUE 20 SQ CM/< 1 ICD-10 Description Diagnosis 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 E11.621 Type 2 diabetes mellitus with foot ulcer CPT4: 24401027 11043 - DEB MUSC/FASCIA 20 SQ CM/< 1 ICD-10 Description Diagnosis E11.52 Type 2 diabetes mellitus with diabetic peripheral angiopathy with gangrene 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

## 2014-07-11 ENCOUNTER — Encounter: Payer: Medicare Other | Admitting: Surgery

## 2014-07-12 ENCOUNTER — Encounter: Payer: Medicare Other | Admitting: Surgery

## 2014-07-13 ENCOUNTER — Encounter: Payer: Medicare Other | Admitting: Surgery

## 2014-07-14 ENCOUNTER — Encounter: Payer: Medicare Other | Admitting: Surgery

## 2014-07-14 DIAGNOSIS — L97323 Non-pressure chronic ulcer of left ankle with necrosis of muscle: Secondary | ICD-10-CM | POA: Diagnosis not present

## 2014-07-15 ENCOUNTER — Encounter: Payer: Medicare Other | Admitting: Surgery

## 2014-07-15 NOTE — Progress Notes (Signed)
MORTIMER, BAIR ([id number]) Visit Report for 07/14/2014 Arrival Information Details Patient Name: MARSTON, MCCADDEN. Date of Service: 07/14/2014 8:45 AM Medical Record Number: [id number] Patient Account Number: [id number] Date of Birth/Sex: Dec 26, 1920 (81 y.o. Male) Treating RN: Clover Mealy, RN, BSN, [place] Sink Primary Care Physician: Lindwood Qua Other Clinician: Referring Physician: Lindwood Qua Treating Physician/Extender: Rudene Re in Treatment: 11 Visit Information History Since Last Visit Added or deleted any medications: Yes Patient Arrived: Wheel Chair Any new allergies or adverse reactions: No Arrival Time: 09:01 Had a fall or experienced change in No activities of daily living that may affect Accompanied By: SON risk of falls: Transfer Assistance: None Signs or symptoms of abuse/neglect since last No Patient Identification Verified: Yes visito Secondary Verification Process Yes Hospitalized since last visit: No Completed: Has Dressing in Place as Prescribed: Yes Patient Requires Transmission-Based No Pain Present Now: No Precautions: Patient Has Alerts: No Electronic Signature(s) Signed: 07/14/2014 5:53:15 PM By: Elpidio Eric BSN, RN Entered By: Elpidio Eric on 07/14/2014 09:08:57 Hanks, Ondra Elbert Ewings ([id number]) -------------------------------------------------------------------------------- Encounter Discharge Information Details Patient Name: Mikesell, Eugean L. Date of Service: 07/14/2014 8:45 AM Medical Record Number: [id number] Patient Account Number: [id number] Date of Birth/Sex: April 29, 1920 (81 y.o. Male) Treating RN: Clover Mealy, RN, BSN, [place] Sink Primary Care Physician: Lindwood Qua Other Clinician: Referring Physician: Lindwood Qua Treating Physician/Extender: Rudene Re in Treatment: 11 Encounter Discharge Information Items Discharge Pain Level: 0 Discharge Condition: Stable Ambulatory Status: Wheelchair Discharge Destination: Home Transportation:  Private Auto Accompanied By: son Schedule Follow-up Appointment: No Medication Reconciliation completed and provided to Patient/Care No Hadyn Blanck: Provided on Clinical Summary of Care: 07/14/2014 Form Type Recipient Paper Patient AE Electronic Signature(s) Signed: 07/14/2014 5:53:15 PM By: Elpidio Eric BSN, RN Previous Signature: 07/14/2014 9:41:04 AM Version By: Gwenlyn Perking Entered By: Elpidio Eric on 07/14/2014 17:06:51 Luna, Jobani Elbert Ewings ([id number]) -------------------------------------------------------------------------------- Lower Extremity Assessment Details Patient Name: Martos, Zakery L. Date of Service: 07/14/2014 8:45 AM Medical Record Number: [id number] Patient Account Number: [id number] Date of Birth/Sex: [birth year] (81 y.o. Male) Treating RN: Clover Mealy, RN, BSN, [place] Sink Primary Care Physician: Lindwood Qua Other Clinician: Referring Physician: Lindwood Qua Treating Physician/Extender: Rudene Re in Treatment: 11 Vascular Assessment Pulses: Posterior Tibial Palpable: [Left:No] Doppler: [Left:Monophasic] Dorsalis Pedis Palpable: [Left:No] Doppler: [Left:Monophasic] Extremity colors, hair growth, and conditions: Extremity Color: [Left:Normal] Hair Growth on Extremity: [Left:No] Temperature of Extremity: [Left:Warm] Capillary Refill: [Left:< 3 seconds] Dependent Rubor: [Left:No] Blanched when Elevated: [Left:No] Lipodermatosclerosis: [Left:No] Toe Nail Assessment Left: Right: Thick: Yes Discolored: Yes Deformed: Yes Improper Length and Hygiene: No Electronic Signature(s) Signed: 07/14/2014 5:53:15 PM By: Elpidio Eric BSN, RN Entered By: Elpidio Eric on 07/14/2014 09:16:39 Robb, Loren Elbert Ewings ([id number]) -------------------------------------------------------------------------------- Multi Wound Chart Details Patient Name: Overholser, Dhruvan L. Date of Service: 07/14/2014 8:45 AM Medical Record Number: [id number] Patient Account Number: [id number] Date of  Birth/Sex: 01/08/1921 (81 y.o. 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] Comorbid History: [1:Cataracts, Chronic sinus Cataracts, Chronic sinus Cataracts, Chronic sinus problems/congestion, Congestive Heart Failure, Congestive Heart Failure, Congestive Heart Failure, Coronary Artery Disease, Coronary Artery Disease, Coronary  Artery Disease, Hypertension, Myocardial Hypertension, Myocardial Hypertension, Myocardial Infarction, Peripheral Arterial Disease, Type II Arterial Disease, Type II Arterial Disease, Type II Diabetes, Neuropathy] [2:problems/congestion, Infarction,  Peripheral Diabetes, Neuropathy] [3:problems/congestion, Infarction, Peripheral Diabetes, Neuropathy] Date Acquired: [1:01/24/2014] [2:01/24/2014] [3:01/24/2014] Weeks of Treatment: [1:11] [2:11] [3:11] Wound Status: [1:Open] [2:Open] [3:Open] Clustered Wound: [1:No] [2:Yes] [3:No] Pending Amputation on Yes [2:No] [3:No] Presentation: Measurements L x W x D 9x4.5x0.5 [2:0.8x3.4x0.2] [3:1x1x0.1] (cm) Area (cm) : [1:31.809] [2:2.136] [3:0.785] Volume (cm) : [1:15.904] [2:0.427] [3:0.079] % Reduction in Area: [1:-28.60%] [2:62.20%] [3:9.10%] % Reduction in Volume: -114.30% [2:24.40%] [3:8.10%] Classification: [1:Full Thickness With Exposed Support Structures] [2:Full Thickness  Without Exposed Support Structures] [3:Unclassifiable] HBO Classification: [1:Grade 3] [2:Grade 1] [3:Grade 0] Wagner Verification: [1:Abscess] [2:N/A] [3:N/A] Exudate Amount: [1:Medium] [2:Small] [3:None Present] Exudate Type: Serous Serous N/A Exudate Color: amber amber N/A Wound Margin: Epibole Distinct, outline attached Distinct, outline attached Granulation Amount: Medium (34-66%) Medium (34-66%) None Present (0%) Granulation Quality: Pink Pink N/A Necrotic Amount: Medium (34-66%) Medium (34-66%) Large (67-100%) Necrotic Tissue: Eschar, Adherent Slough Adherent Slough Eschar Exposed Structures: Tendon: Yes Fascia: No Fascia: No Fascia: No Fat: No Fat: No Fat: No Tendon: No Tendon: No Muscle: No Muscle: No Muscle: No Joint: No Joint: No Joint: No Bone: No Bone: No Bone: No Limited to Skin Limited to Skin Breakdown Breakdown Epithelialization: None Small (1-33%) None Periwound Skin Texture: Edema: No Edema: No Edema: No Excoriation: No Excoriation: No Excoriation: No Induration: No Induration: No Induration: No Callus: No Callus: No Callus: No Crepitus: No Crepitus: No Crepitus: No Fluctuance: No Fluctuance: No Fluctuance: No Friable: No Friable: No Friable: No Rash: No Rash: No Rash: No Scarring: No Scarring: No Scarring: No Periwound Skin Moist: Yes Moist: Yes Dry/Scaly: Yes Moisture: Maceration: No Dry/Scaly: Yes Maceration: No Dry/Scaly: No Maceration: No Moist: No Periwound Skin Color: Erythema: Yes Atrophie Blanche: No Atrophie Blanche: No Rubor: Yes Cyanosis: No Cyanosis: No Atrophie Blanche: No Ecchymosis: No Ecchymosis: No Cyanosis: No Erythema: No Erythema: No Ecchymosis: No Hemosiderin Staining: No Hemosiderin Staining: No Hemosiderin Staining: No Mottled: No Mottled: No Mottled: No Pallor: No Pallor: No Pallor: No Rubor: No Rubor: No Erythema Location: Circumferential N/A N/A Erythema Change: No Change N/A  N/A Temperature: No Abnormality No Abnormality No Abnormality Tenderness on No Yes No Palpation: Wound Preparation: Ulcer Cleansing: Ulcer Cleansing: Ulcer Cleansing: Rinsed/Irrigated with Rinsed/Irrigated with Rinsed/Irrigated with Saline Saline Saline Topical Anesthetic Topical Anesthetic Topical Anesthetic Applied: Other: lidocaine Applied: Other: lidocaine Applied: Other: 4% 4% LIDOCAINE 4% Treatment Notes GAIUS, ISHAQ ([id number]) Electronic Signature(s) Signed: 07/14/2014 5:53:15 PM By: Elpidio Eric BSN, RN Entered By: Elpidio Eric on 07/14/2014 09:20:13 Argueta, [name] ([id number]) -------------------------------------------------------------------------------- Multi-Disciplinary Care Plan Details Patient Name: Ishman, Aarron L. Date of Service: 07/14/2014 8:45 AM Medical Record Number: [id number] Patient Account Number: [id number] Date of Birth/Sex: August 20, 1920 (81 y.o. Male) Treating RN: Clover Mealy, RN, BSN, [place] Sink Primary Care Physician: Lindwood Qua Other Clinician: Referring Physician: Lindwood Qua Treating Physician/Extender: Rudene Re in Treatment: 11 Active Inactive Abuse / Safety / Falls / Self Care Management Nursing Diagnoses: Potential for falls Goals: Patient will remain injury free Date Initiated: 04/28/2014 Goal Status: Active Interventions: Assess fall risk on admission and as needed Notes: Necrotic Tissue Nursing Diagnoses: Impaired tissue integrity related to necrotic/devitalized tissue Goals: Necrotic/devitalized tissue will be minimized in the wound bed Date Initiated: 04/28/2014 Goal Status: Active Interventions: Provide education on necrotic tissue and debridement process Treatment Activities: Apply topical anesthetic as ordered : 07/14/2014 Notes: Nutrition Nursing Diagnoses: Potential  for alteratiion in Nutrition/Potential for imbalanced nutrition Calise, Miken L. ([id number]) Goals: Patient/caregiver agrees to and verbalizes  understanding of need to use nutritional supplements and/or vitamins as prescribed Date Initiated: 04/28/2014 Goal Status: Active Interventions: Assess patient nutrition upon admission and as needed per policy Notes: Orientation to the Wound Care Program Nursing Diagnoses: Knowledge deficit related to the wound healing center program Goals: Patient/caregiver will verbalize understanding of the Wound Healing Center Program Date Initiated: 04/28/2014 Goal Status: Active Interventions: Provide education on orientation to the wound center Notes: Wound/Skin Impairment Nursing Diagnoses: Impaired tissue integrity Goals: Ulcer/skin breakdown will have a volume reduction of 30% by week 4 Date Initiated: 04/28/2014 Goal Status: Active Interventions: Assess ulceration(s) every visit Notes: Electronic Signature(s) Signed: 07/14/2014 5:53:15 PM By: Elpidio Eric BSN, RN Entered By: Elpidio Eric on 07/14/2014 09:20:04 Leeder, Prateek Elbert Ewings ([id number]) -------------------------------------------------------------------------------- Pain Assessment Details Patient Name: Obi, Ruhan L. Date of Service: 07/14/2014 8:45 AM Medical Record Number: [id number] Patient Account Number: [id number] Date of Birth/Sex: 01/11/1921 (81 y.o. Male) Treating RN: Clover Mealy, RN, BSN, [place] Sink Primary Care Physician: Lindwood Qua Other Clinician: Referring Physician: Lindwood Qua Treating Physician/Extender: Rudene Re in Treatment: 11 Active Problems Location of Pain Severity and Description of Pain Patient Has Paino No Site Locations Pain Management and Medication Current Pain Management: Electronic Signature(s) Signed: 07/14/2014 5:53:15 PM By: Elpidio Eric BSN, RN Entered By: Elpidio Eric on 07/14/2014 09:09:06 Seide, [name] ([id number]) -------------------------------------------------------------------------------- Patient/Caregiver Education Details Patient Name: Maggart, Dollie L. Date of Service:  07/14/2014 8:45 AM Medical Record Number: [id number] Patient Account Number: [id number] Date of Birth/Gender: Dec 05, 1920 (81 y.o. Male) Treating RN: Clover Mealy, RN, BSN, [place] Sink Primary Care Physician: Lindwood Qua Other Clinician: Referring Physician: Lindwood Qua Treating Physician/Extender: Rudene Re in Treatment: 11 Education Assessment Education Provided To: Patient Education Topics Provided Basic Hygiene: Methods: Explain/Verbal Responses: State content correctly Welcome To The [facility]: Methods: Explain/Verbal Responses: State content correctly Wound Debridement: Methods: Explain/Verbal Responses: State content correctly Wound/Skin Impairment: Methods: Explain/Verbal Responses: State content correctly Electronic Signature(s) Signed: 07/14/2014 5:53:15 PM By: Elpidio Eric BSN, RN Entered By: Elpidio Eric on 07/14/2014 17:07:18 Trauger, Marshall Elbert Ewings ([id number]) -------------------------------------------------------------------------------- Wound Assessment Details Patient Name: Semrad, Greely L. Date of Service: 07/14/2014 8:45 AM Medical Record Number: [id number] Patient Account Number: [id number] Date of Birth/Sex: Jul 18, 1920 (81 y.o. Male) Treating RN: Clover Mealy, RN, BSN, [place] Primary Care Physician: Lindwood Qua Other Clinician: Referring Physician: Lindwood Qua Treating Physician/Extender: Rudene Re in Treatment: 11 Wound Status Wound Number: 1 Primary Arterial Insufficiency Ulcer Etiology: Wound Location: Left Achilles Wound Open Wounding Event: Gradually Appeared Status: Date Acquired: 01/24/2014 Comorbid Cataracts, Chronic sinus Weeks Of Treatment: 11 History: problems/congestion, Congestive Heart Clustered Wound: No Failure, Coronary Artery Disease, Pending Amputation On Presentation Hypertension, Myocardial Infarction, Peripheral Arterial Disease, Type II Diabetes, Neuropathy Photos Photo Uploaded By: Elpidio Eric on 07/14/2014  17:09:34 Wound Measurements Length: (cm) 9 Width: (cm) 4.5 Depth: (cm) 0.5 Area: (cm) 31.809 Volume: (cm) 15.904 % Reduction in Area: -28.6% % Reduction in Volume: -114.3% Epithelialization: None Tunneling: No Undermining: No Wound Description Full Thickness With Exposed Foul Odor Af Classification: Support Structures Diabetic Severity Grade 3 (Wagner): Wagner Verification: Abscess Wound Margin: Epibole Exudate Amount: Medium Exudate Type: Serous Stanfield, Bleu L. ([id number]) ter Cleansing: No Exudate Color: amber Wound Bed Granulation Amount: Medium (34-66%) Exposed Structure Granulation Quality: Pink Fascia Exposed: No Necrotic Amount: Medium (34-66%) Fat Layer Exposed: No Necrotic Quality: Eschar, Adherent Slough Tendon Exposed: Yes Muscle Exposed: No Joint Exposed: No Bone Exposed:  No Periwound Skin Texture Texture Color No Abnormalities Noted: No No Abnormalities Noted: No Callus: No Atrophie Blanche: No Crepitus: No Cyanosis: No Excoriation: No Ecchymosis: No Fluctuance: No Erythema: Yes Friable: No Erythema Location: Circumferential Induration: No Erythema Change: No Change Localized Edema: No Hemosiderin Staining: No Rash: No Mottled: No Scarring: No Pallor: No Rubor: Yes Moisture No Abnormalities Noted: No Temperature / Pain Dry / Scaly: No Temperature: No Abnormality Maceration: No Moist: Yes Wound Preparation Ulcer Cleansing: Rinsed/Irrigated with Saline Topical Anesthetic Applied: Other: lidocaine 4%, Treatment Notes Wound #1 (Left Achilles) 1. Cleansed with: Clean wound with Normal Saline 3. Peri-wound Care: Skin Prep 4. Dressing Applied: Aquacel Ag 5. Secondary Dressing Applied Gauze and Kerlix/Conform 7. Secured with [employer]) BRANDI, ARMATO ([id number]) Signed: 07/14/2014 5:53:15 PM By: Elpidio Eric BSN, RN Entered By: Elpidio Eric on 07/14/2014 09:17:02 Brenn, [name]  ([id number]) -------------------------------------------------------------------------------- Wound Assessment Details Patient Name: Bluestone, Gerrald L. Date of Service: 07/14/2014 8:45 AM Medical Record Number: [id number] Patient Account Number: [id number] Date of Birth/Sex: [birth year] (81 y.o. Male) Treating RN: Clover Mealy, RN, BSN, [place] Primary Care Physician: Lindwood Qua Other Clinician: Referring Physician: Lindwood Qua Treating Physician/Extender: Rudene Re in Treatment: 11 Wound Status Wound Number: 2 Primary Arterial Insufficiency Ulcer Etiology: Wound Location: Left Calcaneous - Lateral Wound Open Wounding Event: Gradually Appeared Status: Date Acquired: 01/24/2014 Comorbid Cataracts, Chronic sinus Weeks Of Treatment: 11 History: problems/congestion, Congestive Heart Clustered Wound: Yes Failure, Coronary Artery Disease, Hypertension, Myocardial Infarction, Peripheral Arterial Disease, Type II Diabetes, Neuropathy Photos Photo Uploaded By: Elpidio Eric on 07/14/2014 17:09:34 Wound Measurements Length: (cm) 0.8 Width: (cm) 3.4 Depth: (cm) 0.2 Area: (cm) 2.136 Volume: (cm) 0.427 % Reduction in Area: 62.2% % Reduction in Volume: 24.4% Epithelialization: Small (1-33%) Tunneling: No Undermining: No Wound Description Full Thickness Without Foul Odor Aft Classification: Exposed Support Structures Diabetic Severity Grade 1 (Wagner): Wound Margin: Distinct, outline attached Exudate Amount: Small Exudate Type: Serous Exudate Color: amber Zuckerman, Gaylan L. ([id number]) er Cleansing: No Wound Bed Granulation Amount: Medium (34-66%) Exposed Structure Granulation Quality: Pink Fascia Exposed: No Necrotic Amount: Medium (34-66%) Fat Layer Exposed: No Necrotic Quality: Adherent Slough Tendon Exposed: No Muscle Exposed: No Joint Exposed: No Bone Exposed: No Limited to Skin Breakdown Periwound Skin Texture Texture Color No Abnormalities Noted: No No  Abnormalities Noted: No Callus: No Atrophie Blanche: No Crepitus: No Cyanosis: No Excoriation: No Ecchymosis: No Fluctuance: No Erythema: No Friable: No Hemosiderin Staining: No Induration: No Mottled: No Localized Edema: No Pallor: No Rash: No Rubor: No Scarring: No Temperature / Pain Moisture Temperature: No Abnormality No Abnormalities Noted: No Tenderness on Palpation: Yes Dry / Scaly: Yes Maceration: No Moist: Yes Wound Preparation Ulcer Cleansing: Rinsed/Irrigated with Saline Topical Anesthetic Applied: Other: lidocaine 4%, Treatment Notes Wound #2 (Left, Lateral Calcaneous) 1. Cleansed with: Clean wound with Normal Saline 3. Peri-wound Care: Skin Prep 4. Dressing Applied: Aquacel Ag 5. Secondary Dressing Applied Gauze and Kerlix/Conform 7. Secured with [employer]) LAURANCE, HEIDE ([id number]) Signed: 07/14/2014 5:53:15 PM By: Elpidio Eric BSN, RN Entered By: Elpidio Eric on 07/14/2014 09:17:20 Modisette, [name] ([id number]) -------------------------------------------------------------------------------- Wound Assessment Details Patient Name: Warnke, Wynne L. Date of Service: 07/14/2014 8:45 AM Medical Record Number: [id number] Patient Account Number: [id number] Date of Birth/Sex: Jun 23, 1920 (81 y.o. Male) Treating RN: Clover Mealy, RN, BSN, [place] Sink Primary Care Physician: Lindwood Qua Other Clinician: Referring Physician: Lindwood Qua Treating Physician/Extender: Rudene Re in Treatment: 11 Wound Status Wound Number: 3 Primary Arterial Insufficiency Ulcer Etiology: Wound  Location: Left Toe Great Wound Open Wounding Event: Gradually Appeared Status: Date Acquired: 01/24/2014 Comorbid Cataracts, Chronic sinus Weeks Of Treatment: 11 History: problems/congestion, Congestive Heart Clustered Wound: No Failure, Coronary Artery Disease, Hypertension, Myocardial Infarction, Peripheral Arterial Disease, Type II Diabetes,  Neuropathy Photos Photo Uploaded By: Elpidio Eric on 07/14/2014 17:09:35 Wound Measurements Length: (cm) 1 Width: (cm) 1 Depth: (cm) 0.1 Area: (cm) 0.785 Volume: (cm) 0.079 % Reduction in Area: 9.1% % Reduction in Volume: 8.1% Epithelialization: None Tunneling: No Undermining: No Wound Description Classification: Unclassifiable Foul O Diabetic Severity (Wagner): Grade 0 Wound Margin: Distinct, outline attached Exudate Amount: None Present dor After Cleansing: No Wound Bed Granulation Amount: None Present (0%) Exposed Structure Necrotic Amount: Large (67-100%) Fascia Exposed: No Fomby, Finnegan L. ([id number]) Necrotic Quality: Eschar Fat Layer Exposed: No Tendon Exposed: No Muscle Exposed: No Joint Exposed: No Bone Exposed: No Limited to Skin Breakdown Periwound Skin Texture Texture Color No Abnormalities Noted: No No Abnormalities Noted: No Callus: No Atrophie Blanche: No Crepitus: No Cyanosis: No Excoriation: No Ecchymosis: No Fluctuance: No Erythema: No Friable: No Hemosiderin Staining: No Induration: No Mottled: No Localized Edema: No Pallor: No Rash: No Rubor: No Scarring: No Temperature / Pain Moisture Temperature: No Abnormality No Abnormalities Noted: No Dry / Scaly: Yes Maceration: No Moist: No Wound Preparation Ulcer Cleansing: Rinsed/Irrigated with Saline Topical Anesthetic Applied: Other: LIDOCAINE 4%, Treatment Notes Wound #3 (Left Toe Great) 1. Cleansed with: Clean wound with Normal Saline 4. Dressing Applied: Prisma Ag 5. Secondary Dressing Applied Dry Gauze 7. Secured with Secretary/administrator) Signed: 07/14/2014 5:53:15 PM By: Elpidio Eric BSN, RN Entered By: Elpidio Eric on 07/14/2014 09:17:38 Delpizzo, [name] ([id number]) -------------------------------------------------------------------------------- Vitals Details Patient Name: Rhyne, Michaell L. Date of Service: 07/14/2014 8:45 AM Medical Record Number:  [id number] Patient Account Number: [id number] Date of Birth/Sex: [birth year] (81 y.o. Male) Treating RN: Clover Mealy, RN, BSN, Rita Primary Care Physician: Lindwood Qua Other Clinician: Referring Physician: Lindwood Qua Treating Physician/Extender: Rudene Re in Treatment: 11 Vital Signs Time Taken: 09:09 Temperature (F): 97.5 Height (in): 70 Pulse (bpm): 84 Weight (lbs): 200 Respiratory Rate (breaths/min): 18 Body Mass Index (BMI): 28.7 Blood Pressure (mmHg): 118/56 Reference Range: 80 - 120 mg / dl Electronic Signature(s) Signed: 07/14/2014 5:53:15 PM By: Elpidio Eric BSN, RN Entered By: Elpidio Eric on 07/14/2014 09:09:36

## 2014-07-15 NOTE — Progress Notes (Addendum)
RANCE, SMITHSON ([id number]) Visit Report for 07/14/2014 Chief Complaint Document Details Patient Name: [name], [name] Date of Service: 07/14/2014 8:45 AM Medical Record Number: [id number] Patient Account Number: [id number] Date of Birth/Sex: 1920/11/19 (81 y.o. Male) Treating RN: Primary Care Physician: Lindwood Qua Other Clinician: Referring Physician: Lindwood Qua Treating Physician/Extender: Rudene Re in Treatment: 11 Information Obtained from: Patient Chief Complaint Patient presents to the wound care center for a consult due non healing wound 81 year old gentleman who comes with a history of having some ulcerated areas on his heels since February 2016 and then a large injury to his left posterior heel and ankle since about a month. Electronic Signature(s) Signed: 07/14/2014 12:19:52 PM By: Evlyn Kanner MD, FACS Entered By: Evlyn Kanner on 07/14/2014 09:35:19 [name], [name] ([id number]) -------------------------------------------------------------------------------- Debridement Details Patient Name: [name], [name] Date of Service: 07/14/2014 8:45 AM Medical Record Number: [id number] Patient Account Number: [id number] Date of Birth/Sex: 1920-04-10 (81 y.o. Male) Treating RN: Primary Care Physician: Lindwood Qua Other Clinician: Referring Physician: Lindwood Qua Treating Physician/Extender: Rudene Re in Treatment: 11 Debridement Performed for Wound #1 Left Achilles Assessment: Performed By: Physician [name]., MD Debridement: Debridement Pre-procedure Yes Verification/Time Out Taken: Start Time: 09:28 Pain Control: Lidocaine 4% Topical Solution Level: Skin/Subcutaneous Tissue Total Area Debrided (L x 3 (cm) x 4 (cm) = 12 (cm) W): Tissue and other Viable, Non-Viable, Exudate, Fat, Fibrin/Slough, Subcutaneous material debrided: Instrument: Curette Bleeding: Minimum Hemostasis Achieved: Pressure End Time: 09:30 Procedural  Pain: 0 Post Procedural Pain: 0 Response to Treatment: Procedure was tolerated well Post Debridement Measurements of Total Wound Length: (cm) 9 Width: (cm) 4.5 Depth: (cm) 0.5 Volume: (cm) 15.904 Electronic Signature(s) Signed: 07/14/2014 12:19:52 PM By: Evlyn Kanner MD, FACS Entered By: Evlyn Kanner on 07/14/2014 09:34:48 [name] ([id number]) -------------------------------------------------------------------------------- Debridement Details Patient Name: [name], [name] Date of Service: 07/14/2014 8:45 AM Medical Record Number: [id number] Patient Account Number: [id number] Date of Birth/Sex: 03-19-1920 (81 y.o. Male) Treating RN: Primary Care Physician: Lindwood Qua Other Clinician: Referring Physician: Lindwood Qua Treating Physician/Extender: Rudene Re in Treatment: 11 Debridement Performed for Wound #2 Left,Lateral Calcaneous Assessment: Performed By: Physician [name]., MD Debridement: Debridement Pre-procedure Yes Verification/Time Out Taken: Start Time: 09:25 Pain Control: Lidocaine 4% Topical Solution Level: Skin/Subcutaneous Tissue Total Area Debrided (L x 0.8 (cm) x 3.4 (cm) = 2.72 (cm) W): Tissue and other Viable, Non-Viable, Callus, Eschar, Exudate, Fibrin/Slough, Subcutaneous material debrided: Instrument: Curette Bleeding: Minimum Hemostasis Achieved: Pressure End Time: 09:28 Procedural Pain: 0 Post Procedural Pain: 0 Response to Treatment: Procedure was tolerated well Post Debridement Measurements of Total Wound Length: (cm) 0.8 Width: (cm) 3.4 Depth: (cm) 0.3 Volume: (cm) 0.641 Electronic Signature(s) Signed: 07/14/2014 12:19:52 PM By: Evlyn Kanner MD, FACS Entered By: Evlyn Kanner on 07/14/2014 09:35:00 [name] ([id number]) -------------------------------------------------------------------------------- Debridement Details Patient Name: [name], [name] Date of Service: 07/14/2014 8:45 AM Medical  Record Number: [id number] Patient Account Number: [id number] Date of Birth/Sex: 12/11/1920 (81 y.o. Male) Treating RN: Primary Care Physician: Lindwood Qua Other Clinician: Referring Physician: Lindwood Qua Treating Physician/Extender: Rudene Re in Treatment: 11 Debridement Performed for Wound #3 Left Toe Great Assessment: Performed By: Physician [name]., MD Debridement: Debridement Pre-procedure Yes Verification/Time Out Taken: Start Time: 09:23 Pain Control: Lidocaine 4% Topical Solution Level: Skin/Subcutaneous Tissue Total Area Debrided (L x 1 (cm) x 1 (cm) = 1 (cm) W): Tissue and other Eschar material debrided: Instrument: Curette Bleeding: Minimum Hemostasis Achieved: Pressure End Time: 09:23 Procedural  Pain: 0 Post Procedural Pain: 0 Response to Treatment: Procedure was tolerated well Post Debridement Measurements of Total Wound Length: (cm) 1 Width: (cm) 1 Depth: (cm) 0.1 Volume: (cm) 0.079 Electronic Signature(s) Signed: 07/14/2014 12:19:52 PM By: Evlyn Kanner MD, FACS Entered By: Evlyn Kanner on 07/14/2014 09:35:08 [name] ([id number]) -------------------------------------------------------------------------------- HPI Details Patient Name: [name], [name] Date of Service: 07/14/2014 8:45 AM Medical Record Number: [id number] Patient Account Number: [id number] Date of Birth/Sex: [birth year] (81 y.o. Male) Treating RN: Primary Care Physician: Lindwood Qua Other Clinician: Referring Physician: Lindwood Qua Treating Physician/Extender: Rudene Re in Treatment: 11 History of Present Illness HPI Description: 81 year old gentleman who was known to be a diabetic for many years has peripheral neuropathy recently went to his primary care doctor for a punctured wound on his left heel which was something he had noticed. The patient then was referred to a podiatrist who referred him to Dr. Wyn Quaker in the vascular surgery  department and I understand the procedure was done on 03/14/2014 with a left lower extremity vascular procedure and stenting was done. Details of this are not available at the present time. The patient has been applying Neosporin to his leg and has not had any wound care addressed so far. patient has also had a history of coronary artery disease in the past and hasn't had a CABG and a pacemaker placement in the remote past.Other details and notes are pending. the patient is not in pain lives alone and has some home health and other aides coming to help him with his daily chores and meals. reviewing the vascular notes I understand the procedure was done on 03/14/2014 and he was operated by Dr. Wyn Quaker. he had a catheter placement to the left peroneal artery and a aortogram and selective left lower extremity angiogram was done. He also had a percutaneous transluminal angioplasty of the left peroneal artery and the tibioperoneal trunk. The distal SFA and above-knee popliteal artery were also angioplastied. A subcutaneous stent placement to the distal superficial femoral artery was also done. 05/05/2014 -- the patient has not had any change in his health and after much consideration is decided that he does not want HBOT as he is claustrophobic and was unable to tolerate being in the chamber for 90 minutes. I have discussed with him that his most recent x-ray of the foot shows that he has the distal phalanx of the left great toe showing changes with osteomyelitis cannot be excluded at that site. He tells me that he cannot have an MRI because of his defibrillator and hence we will order a triple phase bone scan. I have also reviewed his culture report which shows several organisms and he has sensitivity to tetracycline and we have recommended he takes this for 14 days and this has been given to him on 05/02/2014 05/12/2014 the bone scan done on 05/10/2014 shows #1 findings are worrisome for osteomyelitis  involving the calcaneus of the left foot, #2 increase uptake localizing to the left second and third toe on all 3 phases. Cannot rule out osteomyelitis in this area. And #3 left foot cellulitis. 05/19/2014 -- reviewed several reports which we have received back on Everrett Coombe. #1 chest x-ray done on April 21 shows chronic changes in the left base no acute findings. #2 his CBC is within normal limits. #3 hemoglobin A1c was 8.5%. #4 EKG done on 26 April was within normal limits due to his electronic ventricular pacemaker. 05/26/2014 -- he has had his PICC line placed  and is taking vancomycin daily basis. He is to start hyperbaric oxygen therapy on this coming Monday. 06/09/2014 -- he saw Dr. Sampson Goon yesterday and besides his IV antibiotic I believe a oral antibiotic was JAMIER, URBAS. ([id number]) also given and this may be Cipro. Patient is feeling fine otherwise does not have any symptoms though his blood pressure this morning in the wound center has been 90/50. We will check his blood pressure in the supine position. 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 Complained of worsening fatigue yesterday after HBO. Feels better today. Has appointment with PCP this afternoon. He wishes to hold off on HBO until next week. No significant pain. No fever or chills. Stable drainage. 06/23/2014 Rudolf has taken a break from HBO T and has been feeling a little better. He was seen by the nurse practitioner at his PCPs office and at that time his vitals were stable and they did get some EKG done which was within normal limits. There have sent out some lab work which we still have to receive reports. He is going to see his PCP back this afternoon and will be seeing Dr. Sampson Goon tomorrow for review. addendum: we have received labs from his PCPs office and note that his potassium is high at 5.4 and his BUN/creatinine is slightly raised. His blood sugar was 216. His  total protein and albumen were 5.9 and 3 and this was low. His HandH was 10.3 and 31.4 WBC was 8.2 and his platelets were 304. CRP was 29.6 which was high. Vancomycin trough was 19.1 which was normal TSH was 3.11 which was normal 06/30/2014 -- Eliberto Ivory feels weak today and he feels his stomach is acting up and his not feeling up to doing hyperbaric oxygen therapy. He would like to take a break today and tomorrow and resume on Monday. He is on vancomycin and Levaquin as per ID and he has an appointment to see the vascular surgeons in about 10 days' time. 07/07/2014 -- Eliberto Ivory has been feeling weak all along and he has decided that he is not going to continue with hyperbaric oxygen therapy and a longer period. He had finished 16 of his 40 treatments and will not take anymore. He has an appointment with ID tomorrow and also his vascular surgeons and I will have a conference with both of them regarding his further care. 07/14/2014 -- over the last week I have contacted Dr. Sampson Goon and Dr. Wyn Quaker and discussed his care. Dr. Wyn Quaker agrees that he will take him to the OR and debrided his wound and use a wound VAC. Dr. Sampson Goon has altered his anti-biotic regimen - vancomycin has been stopped and he is on oral levofloxacin. The patient's surgery is scheduled for July 7. Electronic Signature(s) Signed: 07/14/2014 12:19:52 PM By: Evlyn Kanner MD, FACS Entered By: Evlyn Kanner on 07/14/2014 09:36:44 Mol, [name] ([id number]) -------------------------------------------------------------------------------- Physical Exam Details Patient Name: [name], [name] Date of Service: 07/14/2014 8:45 AM Medical Record Number: [id number] Patient Account Number: [id number] Date of Birth/Sex: Jan 10, 1921 (81 y.o. Male) Treating RN: Primary Care Physician: Lindwood Qua Other Clinician: Referring Physician: Lindwood Qua Treating Physician/Extender: Rudene Re in Treatment: 11 Constitutional . Pulse  regular. Respirations normal and unlabored. Afebrile. . Eyes Nonicteric. Reactive to light. Ears, Nose, Mouth, and Throat Lips, teeth, and gums WNL.Marland Kitchen Moist mucosa without lesions . Neck supple and nontender. No palpable supraclavicular or cervical adenopathy. Normal sized without goiter. Respiratory WNL. No retractions.. Cardiovascular no  palpable pedal pulses. +1 pitting edema of the feet and lower extremities.. Integumentary (Hair, Skin) the Achillis tendon and the calcaneal bone is exposed and there is a lot of debris around this.. No crepitus or fluctuance. No peri-wound warmth or erythema. No masses.Marland Kitchen Psychiatric Judgement and insight Intact.. No evidence of depression, anxiety, or agitation.. Electronic Signature(s) Signed: 07/14/2014 12:19:52 PM By: Evlyn Kanner MD, FACS Entered By: Evlyn Kanner on 07/14/2014 09:37:39 [name], [name] ([id number]) -------------------------------------------------------------------------------- Physician Orders Details Patient Name: [name], [name] Date of Service: 07/14/2014 8:45 AM Medical Record Number: [id number] Patient Account Number: [id number] Date of Birth/Sex: 02/06/1920 (81 y.o. 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 o May Shower, gently pat wound dry prior to applying new dressing. o May shower with protection. Wound #2 Left,Lateral Calcaneous o Clean wound with Normal Saline. o May Shower, gently pat wound dry prior to applying new dressing. o May shower with protection. Wound #3 Left Toe Great o Clean wound with Normal Saline. o May Shower, gently pat wound dry prior to applying new dressing. o May shower with protection. Skin Barriers/Peri-Wound Care Wound #1 Left Achilles o Skin Prep Wound #2 Left,Lateral Calcaneous o Skin Prep Wound #3 Left Toe Great o Skin Prep Primary Wound Dressing Wound #1 Left Achilles Ayars, Avante L. ([id number]) o Aquacel Ag Wound #2 Left,Lateral Calcaneous o Aquacel Ag Wound #3 Left Toe Great o Prisma Ag Secondary Dressing Wound #1 Left Achilles o Gauze and Kerlix/Conform Wound #2 Left,Lateral Calcaneous o Gauze and Kerlix/Conform Wound #3 Left Toe Great o Gauze and Kerlix/Conform Dressing Change Frequency Wound #1 Left Achilles o Change dressing every other day. Wound #2 Left,Lateral Calcaneous o Change dressing every other day. Wound #3 Left Toe Great o Change dressing every other day. Follow-up Appointments Wound #1 Left Achilles o Return Appointment in 1 week. Wound #2 Left,Lateral Calcaneous o Return Appointment in 1 week. Wound #3 Left Toe Great o Return Appointment in 1 week. Electronic Signature(s) Signed: 07/14/2014 12:19:52 PM By: Evlyn Kanner MD, FACS Signed: 07/14/2014 5:53:15 PM By: Elpidio Eric BSN, RN Entered By: Elpidio Eric on 07/14/2014 10:02:20 Dethlefs, [name] ([id number]) -------------------------------------------------------------------------------- Problem List Details Patient Name: Plagge, Duvid L. Date of Service: 07/14/2014 8:45 AM Medical Record Number: [id number] Patient Account Number: [id number] Date of Birth/Sex: 07/10/1920 (81 y.o. 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 Date of Service: 07/14/2014 8:45 AM Medical Record Number: [id number] Patient Account Number: [id number] Date of Birth/Sex: Jun 17, 1920 (81 y.o. Male) Treating RN: Primary Care Physician: Lindwood Qua Other Clinician: Referring Physician: Lindwood Qua Treating Physician/Extender: Rudene Re in Treatment: 11 Subjective Chief Complaint Information obtained from Patient Patient presents to the wound care center for a consult due non healing wound 81 year old gentleman who comes with a history of having some ulcerated areas on his heels since February 2016 and then a large injury to his left posterior heel and ankle since about a month. History of Present Illness (HPI) 81 year old gentleman who was known to be a diabetic for many years has peripheral neuropathy recently went to his primary care doctor for a punctured wound on his left heel which was something he had noticed. The patient then was referred to a  podiatrist who referred him to Dr. Wyn Quaker in the vascular surgery department and I understand the procedure was done on 03/14/2014 with a left lower extremity vascular procedure and stenting was done. Details of this are not available at the present time. The patient has been applying Neosporin to his leg and has not had any wound care addressed so far. patient has also had a history of coronary artery disease in the past and hasn't had a CABG and a pacemaker placement in the remote past.Other details and notes are pending. the patient is not in pain lives alone and has some home health and other aides coming to help him with his daily chores and meals. reviewing the vascular notes I understand the procedure was done on 03/14/2014 and he was operated by Dr. Wyn Quaker. he had a catheter placement to the left peroneal artery and a aortogram and selective left lower extremity angiogram was done. He also had a percutaneous transluminal angioplasty of the left peroneal artery and the tibioperoneal trunk. The distal SFA and above-knee popliteal artery were also angioplastied. A subcutaneous stent placement to the distal superficial femoral artery was also done. 05/05/2014 -- the patient has not had any change in his health and after much consideration is decided that he does not want HBOT as he is claustrophobic and was unable to tolerate being in the chamber for 90 minutes. I have discussed with him that his most recent x-ray of the foot shows that he has the distal phalanx of the left great toe showing changes with osteomyelitis cannot be excluded at that site. He tells me that he cannot have an MRI because of his defibrillator and hence we will order a triple phase bone scan. I have also reviewed his culture report which shows several organisms and he has sensitivity to tetracycline and we have recommended he takes this for 14 days and this has been given to him on 05/02/2014 05/12/2014 the bone scan done  on 05/10/2014 shows #1 findings are worrisome for osteomyelitis involving the calcaneus of the left foot, #2 increase uptake localizing to the left second and third toe on all 3 phases. Cannot rule out osteomyelitis in this area. And #3 left foot cellulitis. 05/19/2014 -- reviewed several reports which we have received back on [facility] Coom[facility]. #1 chest x-ray done on April 21 shows chronic changes in the left base no acute findings. Spanier, Lue L. ([id number]) #2 his CBC is within normal limits. #3 hemoglobin A1c was 8.5%. #  4 EKG done on 26 April was within normal limits due to his electronic ventricular pacemaker. 05/26/2014 -- he has had his PICC line placed and is taking vancomycin daily basis. He is to start hyperbaric oxygen therapy on this coming Monday. 06/09/2014 -- he saw Dr. Sampson Goon yesterday and besides his IV antibiotic I believe a oral antibiotic was also given and this may be Cipro. Patient is feeling fine otherwise does not have any symptoms though his blood pressure this morning in the wound center has been 90/50. We will check his blood pressure in the supine position. 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 Complained of worsening fatigue yesterday after HBO. Feels better today. Has appointment with PCP this afternoon. He wishes to hold off on HBO until next week. No significant pain. No fever or chills. Stable drainage. 06/23/2014 Alexan has taken a break from HBO T and has been feeling a little better. He was seen by the nurse practitioner at his PCPs office and at that time his vitals were stable and they did get some EKG done which was within normal limits. There have sent out some lab work which we still have to receive reports. He is going to see his PCP back this afternoon and will be seeing Dr. Sampson Goon tomorrow for review. addendum: we have received labs from his PCPs office and note that his potassium is high at 5.4 and  his BUN/creatinine is slightly raised. His blood sugar was 216. His total protein and albumen were 5.9 and 3 and this was low. His HandH was 10.3 and 31.4 WBC was 8.2 and his platelets were 304. CRP was 29.6 which was high. Vancomycin trough was 19.1 which was normal TSH was 3.11 which was normal 06/30/2014 -- Eliberto Ivory feels weak today and he feels his stomach is acting up and his not feeling up to doing hyperbaric oxygen therapy. He would like to take a break today and tomorrow and resume on Monday. He is on vancomycin and Levaquin as per ID and he has an appointment to see the vascular surgeons in about 10 days' time. 07/07/2014 -- Eliberto Ivory has been feeling weak all along and he has decided that he is not going to continue with hyperbaric oxygen therapy and a longer period. He had finished 16 of his 40 treatments and will not take anymore. He has an appointment with ID tomorrow and also his vascular surgeons and I will have a conference with both of them regarding his further care. 07/14/2014 -- over the last week I have contacted Dr. Sampson Goon and Dr. Wyn Quaker and discussed his care. Dr. Wyn Quaker agrees that he will take him to the OR and debrided his wound and use a wound VAC. Dr. Sampson Goon has altered his anti-biotic regimen - vancomycin has been stopped and he is on oral levofloxacin. The patient's surgery is scheduled for July 7. Objective [name], [name] ([id number]) Constitutional Pulse regular. Respirations normal and unlabored. Afebrile. Vitals Time Taken: 9:09 AM, Height: 70 in, Weight: 200 lbs, BMI: 28.7, Temperature: 97.5 F, Pulse: 84 bpm, Respiratory Rate: 18 breaths/min, Blood Pressure: 118/56 mmHg. Eyes Nonicteric. Reactive to light. Ears, Nose, Mouth, and Throat Lips, teeth, and gums WNL.Marland Kitchen Moist mucosa without lesions . Neck supple and nontender. No palpable supraclavicular or cervical adenopathy. Normal sized without goiter. Respiratory WNL. No  retractions.. Cardiovascular no palpable pedal pulses. +1 pitting edema of the feet and lower extremities.Marland Kitchen Psychiatric Judgement and insight Intact.. No evidence of depression, anxiety,  or agitation.. Integumentary (Hair, Skin) the Achillis tendon and the calcaneal bone is exposed and there is a lot of debris around this.. No crepitus or fluctuance. No peri-wound warmth or erythema. No masses.. Wound #1 status is Open. Original cause of wound was Gradually Appeared. The wound is located on the Left Achilles. The wound measures 9cm length x 4.5cm width x 0.5cm depth; 31.809cm^2 area and 15.904cm^3 volume. There is tendon exposed. There is no tunneling or undermining noted. There is a medium amount of serous drainage noted. The wound margin is epibole. There is medium (34-66%) pink granulation within the wound bed. There is a medium (34-66%) amount of necrotic tissue within the wound bed including Eschar and Adherent Slough. The periwound skin appearance exhibited: Moist, Rubor, Erythema. The periwound skin appearance did not exhibit: Callus, Crepitus, Excoriation, Fluctuance, Friable, Induration, Localized Edema, Rash, Scarring, Dry/Scaly, Maceration, Atrophie Blanche, Cyanosis, Ecchymosis, Hemosiderin Staining, Mottled, Pallor. The surrounding wound skin color is noted with erythema which is circumferential. Periwound temperature was noted as No Abnormality. Wound #2 status is Open. Original cause of wound was Gradually Appeared. The wound is located on the Left,Lateral Calcaneous. The wound measures 0.8cm length x 3.4cm width x 0.2cm depth; 2.136cm^2 area and 0.427cm^3 volume. The wound is limited to skin breakdown. There is no tunneling or undermining noted. There is a small amount of serous drainage noted. The wound margin is distinct with the outline attached to the wound base. There is medium (34-66%) pink granulation within the wound bed. There is a medium (34-66%) amount of necrotic  tissue within the wound bed including Adherent Slough. The periwound skin appearance exhibited: Dry/Scaly, Moist. The periwound skin appearance did not exhibit: Callus, Crepitus, Excoriation, Fluctuance, Friable, Induration, Localized Edema, Rash, Scarring, Dall, Zachari L. ([id number]) Maceration, Atrophie Blanche, Cyanosis, Ecchymosis, Hemosiderin Staining, Mottled, Pallor, Rubor, Erythema. Periwound temperature was noted as No Abnormality. The periwound has tenderness on palpation. Wound #3 status is Open. Original cause of wound was Gradually Appeared. The wound is located on the Left Toe Great. The wound measures 1cm length x 1cm width x 0.1cm depth; 0.785cm^2 area and 0.079cm^3 volume. The wound is limited to skin breakdown. There is no tunneling or undermining noted. There is a none present amount of drainage noted. The wound margin is distinct with the outline attached to the wound base. There is no granulation within the wound bed. There is a large (67-100%) amount of necrotic tissue within the wound bed including Eschar. The periwound skin appearance exhibited: Dry/Scaly. The periwound skin appearance did not exhibit: Callus, Crepitus, Excoriation, Fluctuance, Friable, Induration, Localized Edema, Rash, Scarring, Maceration, Moist, Atrophie Blanche, Cyanosis, Ecchymosis, Hemosiderin Staining, Mottled, Pallor, Rubor, Erythema. Periwound temperature was noted as No Abnormality. 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 The rest of the wounds will have Prisma AG. He will come back and see me next week. 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 There was a Skin/Subcutaneous Tissue Debridement (40981-19147) debridement with total area of 12 sq cm performed  by [name]., MD. with the following instrument(s): Curette to remove Viable and Non-Viable tissue/material including Exudate, Fat, Fibrin/Slough, and Subcutaneous after achieving pain control using Lidocaine 4% Topical Solution. A time out was conducted prior to the start of the procedure. A Minimum amount of bleeding was controlled with Pressure. The procedure was tolerated well with a pain level of 0 throughout and a pain level of 0 following the procedure. Post Debridement Measurements: 9cm length x 4.5cm width x 0.5cm depth; Dusing, Hershel L. ([id number]) 15.904cm^3 volume. Wound #2 Wound #2 is an Arterial Insufficiency Ulcer located on the Left,Lateral Calcaneous . There was a Skin/Subcutaneous Tissue Debridement (40981-19147) debridement with total area of 2.72 sq cm performed by [name]., MD. with the following instrument(s): Curette to remove Viable and Non-Viable tissue/material including Exudate, Fibrin/Slough, Eschar, Callus, and Subcutaneous after achieving pain control using Lidocaine 4% Topical Solution. A time out was conducted prior to the start of the procedure. A Minimum amount of bleeding was controlled with Pressure. The procedure was tolerated well with a pain level of 0 throughout and a pain level of 0 following the procedure. Post Debridement Measurements: 0.8cm length x 3.4cm width x 0.3cm depth; 0.641cm^3 volume. Wound #3 Wound #3 is an Arterial Insufficiency Ulcer located on the Left Toe Great . There was a Skin/Subcutaneous Tissue Debridement (82956-21308) debridement with total area of 1 sq cm performed by Calvin Jablonowski, Ignacia Felling., MD. with the following  instrument(s): Curette including Eschar after achieving pain control using Lidocaine 4% Topical Solution. A time out was conducted prior to the start of the procedure. A Minimum amount of bleeding was controlled with Pressure. The procedure was tolerated well with a pain level of 0 throughout and a pain level of 0 following the procedure. Post Debridement Measurements: 1cm length x 1cm width x 0.1cm depth; 0.079cm^3 volume. Plan Wound Cleansing: Wound #1 Left Achilles: Clean wound with Normal Saline. May Shower, gently pat wound dry prior to applying new dressing. May shower with protection. Wound #2 Left,Lateral Calcaneous: Clean wound with Normal Saline. May Shower, gently pat wound dry prior to applying new dressing. May shower with protection. Wound #3 Left Toe Great: Clean wound with Normal Saline. May Shower, gently pat wound dry prior to applying new dressing. May shower with protection. Skin Barriers/Peri-Wound Care: Wound #1 Left Achilles: Skin Prep Wound #2 Left,Lateral Calcaneous: Skin Prep Wound #3 Left Toe Great: Skin Prep Primary Wound Dressing: Wound #1 Left Achilles: Aquacel Ag [place], Kelii L. ([id number]) Wound #2 Left,Lateral Calcaneous: Aquacel Ag Wound #3 Left Toe Great: Prisma Ag Secondary Dressing: Wound #1 Left Achilles: Gauze and Kerlix/Conform Wound #2 Left,Lateral Calcaneous: Gauze and Kerlix/Conform Wound #3 Left Toe Great: Gauze and Kerlix/Conform Dressing Change Frequency: Wound #1 Left Achilles: Change dressing every other day. Wound #2 Left,Lateral Calcaneous: Change dressing every other day. Wound #3 Left Toe Great: Change dressing every other day. Follow-up Appointments: Wound #1 Left Achilles: Return Appointment in 1 week. Wound #2 Left,Lateral Calcaneous: Return Appointment in 1 week. Wound #3 Left Toe Great: Return Appointment in 1 week. Since he is going to be sharply debrided I stopped the sample and we will continue with  silver alginate on his heel. The rest of the wounds will have Prisma AG. He will come back and see me next week. Electronic Signature(s) Signed: 07/15/2014 3:06:36 PM By: Evlyn Kanner MD, FACS Previous Signature: 07/14/2014 12:19:52 PM Version By: Evlyn Kanner MD, FACS Entered By: Evlyn Kanner on 07/15/2014 15:06:35 [place], Jaqwan [name] ([id number]) --------------------------------------------------------------------------------  SuperBill Details Patient Name: Eriksen, Riyad L. Date of Service: 07/14/2014 Medical Record Number: [id number] Patient Account Number: [id number] Date of Birth/Sex: [birth year] (81 y.o. 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 M86.372 Chronic multifocal osteomyelitis, left ankle and foot Facility Procedures CPT4: Description Modifier Quantity Code [id number] 11042 - DEB SUBQ TISSUE 20 SQ CM/< 1 ICD-10 Description Diagnosis E11.621 Type 2 diabetes mellitus with foot ulcer I70.244 Atherosclerosis of native arteries of left leg with ulceration of heel and  midfoot 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

## 2014-07-19 ENCOUNTER — Encounter
Admission: RE | Admit: 2014-07-19 | Discharge: 2014-07-19 | Disposition: A | Discharge status: Home / Self Care | Payer: Medicare Other | Source: Ambulatory Visit | Attending: Vascular Surgery | Admitting: Vascular Surgery

## 2014-07-19 DIAGNOSIS — Z01812 Encounter for preprocedural laboratory examination: Secondary | ICD-10-CM | POA: Diagnosis not present

## 2014-07-19 HISTORY — DX: Non-pressure chronic ulcer of other part of unspecified foot with unspecified severity: L97.509

## 2014-07-19 HISTORY — DX: Peripheral vascular disease, unspecified: I73.9

## 2014-07-19 HISTORY — DX: Pure hypercholesterolemia, unspecified: E78.00

## 2014-07-19 HISTORY — DX: Benign prostatic hyperplasia without lower urinary tract symptoms: N40.0

## 2014-07-19 HISTORY — DX: Heart failure, unspecified: I50.9

## 2014-07-19 HISTORY — DX: Non-pressure chronic ulcer of unspecified heel and midfoot with unspecified severity: L97.409

## 2014-07-19 HISTORY — DX: Acute myocardial infarction, unspecified: I21.9

## 2014-07-19 LAB — CBC
HEMATOCRIT: 35.8 % — AB (ref 40.0–52.0)
Hemoglobin: 11.5 g/dL — ABNORMAL LOW (ref 13.0–18.0)
MCH: 27.9 pg (ref 26.0–34.0)
MCHC: 32.2 g/dL (ref 32.0–36.0)
MCV: 86.6 fL (ref 80.0–100.0)
Platelets: 244 10*3/uL (ref 150–440)
RBC: 4.13 MIL/uL — ABNORMAL LOW (ref 4.40–5.90)
RDW: 14.3 % (ref 11.5–14.5)
WBC: 8.3 10*3/uL (ref 3.8–10.6)

## 2014-07-19 LAB — BASIC METABOLIC PANEL
Anion gap: 8 (ref 5–15)
BUN: 40 mg/dL — AB (ref 6–20)
CO2: 27 mmol/L (ref 22–32)
Calcium: 8.3 mg/dL — ABNORMAL LOW (ref 8.9–10.3)
Chloride: 105 mmol/L (ref 101–111)
Creatinine, Ser: 2.3 mg/dL — ABNORMAL HIGH (ref 0.61–1.24)
GFR calc Af Amer: 27 mL/min — ABNORMAL LOW (ref >60)
GFR calc non Af Amer: 23 mL/min — ABNORMAL LOW (ref >60)
GLUCOSE: 157 mg/dL — AB (ref 65–99)
Potassium: 5.6 mmol/L — ABNORMAL HIGH (ref 3.5–5.1)
Sodium: 140 mmol/L (ref 135–145)

## 2014-07-19 NOTE — Patient Instructions (Signed)
  Your procedure is scheduled on: Thursday 07/28/14 Report to Day Surgery. 2nd floor. Medical Mall Entrance To find out your arrival time please call [phone] between 1PM - 3PM on Wednesday 07/27/14.  Remember: Instructions that are not followed completely may result in serious medical risk, up to and including death, or upon the discretion of your surgeon and anesthesiologist your surgery may need to be rescheduled.    __X__ 1. Do not eat food or drink liquids after midnight. No gum chewing or hard candies.     __X__ 2. No Alcohol for 24 hours before or after surgery.   ____ 3. Bring all medications with you on the day of surgery if instructed.    __X__ 4. Notify your doctor if there is any change in your medical condition     (cold, fever, infections).     Do not wear jewelry, make-up, hairpins, clips or nail polish.  Do not wear lotions, powders, or perfumes.   Do not shave 48 hours prior to surgery. Men may shave face and neck.  Do not bring valuables to the hospital.    [facility] or valuables.               Contacts, dentures or bridgework may not be worn into surgery.  Leave your suitcase in the car. After surgery it may be brought to your room.  For patients admitted to the hospital, discharge time is determined by your                treatment team.   Patients discharged the day of surgery will not be allowed to drive home.   Please read over the following fact sheets that you were given:   Surgical Site Infection Prevention   _X___ Take these medicines the morning of surgery with A SIP OF WATER:    1.  METOPROLOL  2.   3.   4.  5.  6.  ____ Fleet Enema (as directed)   __X__ Use CHG Soap as directed  USE SAGE WIPES TWO NIGHTS IN A ROW PRIOR TO SURGERY  ____ Use inhalers on the day of surgery  ____ Stop metformin 2 days prior to surgery    ____ Take 1/2 of usual insulin dose the night before surgery and none on the  morning of surgery.   ____ Stop Coumadin/Plavix/aspirin on   ____ Stop Anti-inflammatories on    ____ Stop supplements until after surgery.    ____ Bring C-Pap to the hospital.

## 2014-07-21 ENCOUNTER — Encounter: Payer: Medicare Other | Admitting: Surgery

## 2014-07-21 DIAGNOSIS — L97323 Non-pressure chronic ulcer of left ankle with necrosis of muscle: Secondary | ICD-10-CM | POA: Diagnosis not present

## 2014-07-21 NOTE — Progress Notes (Signed)
[name], Antino L. ([id number]) Visit Report for 07/21/2014 Chief Complaint Document Details Patient Name: [name], [name] Date of Service: 07/21/2014 11:45 AM Medical Record Number: [id number] Patient Account Number: [id number] Date of Birth/Sex: 02/11/1920 16(81 y.o. Male) Treating RN: Primary Care Physician: [name], [name] Other Clinician: Referring Physician: [name], [name] Treating Physician/Extender: Rudene ReBritto, Gavriela Cashin Weeks in Treatment: 12 Information Obtained from: Patient Chief Complaint Patient presents to the wound care center for a consult due non healing wound 81 year old gentleman who comes with a history of having some ulcerated areas on his heels since February 2016 and then a large injury to his left posterior heel and ankle since about a month. Electronic Signature(s) Signed: 07/21/2014 1:47:03 PM By: Evlyn KannerBritto, Ally Knodel MD, FACS Entered By: Evlyn KannerBritto, Yuliet Needs on 07/21/2014 13:11:13 Cephus, James Elbert EwingsL. ([id number]) -------------------------------------------------------------------------------- Debridement Details Patient Name: [name], [name] Date of Service: 07/21/2014 11:45 AM Medical Record Number: [id number] Patient Account Number: [id number] Date of Birth/Sex: 02/11/1920 84(81 y.o. Male) Treating RN: Primary Care Physician: [name], [name] Other Clinician: Referring Physician: [name], [name] Treating Physician/Extender: Rudene ReBritto, Sherrel Ploch Weeks in Treatment: 12 Debridement Performed for Wound #2 Left,Lateral Calcaneous Assessment: Performed By: Physician Tristan SchroederBritto, Namiah Dunnavant J., MD Debridement: Debridement Pre-procedure Yes Verification/Time Out Taken: Start Time: 12:24 Pain Control: Lidocaine 4% Topical Solution Level: Skin/Subcutaneous Tissue Total Area Debrided (L x 0.5 (cm) x 3.2 (cm) = 1.6 (cm) W): Tissue and other Viable, Non-Viable, Fibrin/Slough, Subcutaneous material debrided: Instrument: Curette Bleeding: Minimum Hemostasis Achieved: Pressure End Time: 12:29 Procedural  Pain: 0 Post Procedural Pain: 0 Response to Treatment: Procedure was tolerated well Post Debridement Measurements of Total Wound Length: (cm) 0.5 Width: (cm) 3.2 Depth: (cm) 0.2 Volume: (cm) 0.251 Electronic Signature(s) Signed: 07/21/2014 1:47:03 PM By: Evlyn KannerBritto, Amerie Beaumont MD, FACS Entered By: Evlyn KannerBritto, Jalyssa Fleisher on 07/21/2014 13:10:26 Klima, Nik Elbert EwingsL. ([id number]) -------------------------------------------------------------------------------- Debridement Details Patient Name: [name], [name] Date of Service: 07/21/2014 11:45 AM Medical Record Number: [id number] Patient Account Number: [id number] Date of Birth/Sex: 02/11/1920 73(81 y.o. Male) Treating RN: Primary Care Physician: [name], [name] Other Clinician: Referring Physician: [name], [name] Treating Physician/Extender: Rudene ReBritto, Earnie Bechard Weeks in Treatment: 12 Debridement Performed for Wound #3 Left Toe Great Assessment: Performed By: Physician Tristan SchroederBritto, Tremont Gavitt J., MD Debridement: Debridement Pre-procedure Yes Verification/Time Out Taken: Start Time: 12:22 Pain Control: Lidocaine 4% Topical Solution Level: Skin/Subcutaneous Tissue Total Area Debrided (L x 0.7 (cm) x 0.9 (cm) = 0.63 (cm) W): Tissue and other Viable, Non-Viable, Fibrin/Slough, Subcutaneous material debrided: Instrument: Curette Bleeding: Minimum Hemostasis Achieved: Pressure End Time: 12:24 Procedural Pain: 0 Post Procedural Pain: 0 Response to Treatment: Procedure was tolerated well Post Debridement Measurements of Total Wound Length: (cm) 0.7 Width: (cm) 0.9 Depth: (cm) 0.1 Volume: (cm) 0.049 Electronic Signature(s) Signed: 07/21/2014 1:47:03 PM By: Evlyn KannerBritto, Daja Shuping MD, FACS Entered By: Evlyn KannerBritto, Amairany Schumpert on 07/21/2014 13:11:01 Feimster, Partick L. ([id number]) -------------------------------------------------------------------------------- HPI Details Patient Name: [name], [name] Date of Service: 07/21/2014 11:45 AM Medical Record Number: [id number] Patient Account  Number: [id number] Date of Birth/Sex: 02/11/1920 56(81 y.o. Male) Treating RN: Primary Care Physician: [name], [name] Other Clinician: Referring Physician: [name], [name] Treating Physician/Extender: Rudene ReBritto, Rhonda Vangieson Weeks in Treatment: 12 History of Present Illness HPI Description: 81 year old gentleman who was known to be a diabetic for many years has peripheral neuropathy recently went to his primary care doctor for a punctured wound on his left heel which was something he had noticed. The patient then was referred to a podiatrist who referred him to Dr. Wyn Quakerew in the vascular surgery department and I understand the procedure was done on 03/14/2014  with a left lower extremity vascular procedure and stenting was done. Details of this are not available at the present time. The patient has been applying Neosporin to his leg and has not had any wound care addressed so far. patient has also had a history of coronary artery disease in the past and hasn't had a CABG and a pacemaker placement in the remote past.Other details and notes are pending. the patient is not in pain lives alone and has some home health and other aides coming to help him with his daily chores and meals. reviewing the vascular notes I understand the procedure was done on 03/14/2014 and he was operated by Dr. Wyn Quaker. he had a catheter placement to the left peroneal artery and a aortogram and selective left lower extremity angiogram was done. He also had a percutaneous transluminal angioplasty of the left peroneal artery and the tibioperoneal trunk. The distal SFA and above-knee popliteal artery were also angioplastied. A subcutaneous stent placement to the distal superficial femoral artery was also done. 05/05/2014 -- the patient has not had any change in his health and after much consideration is decided that he does not want HBOT as he is claustrophobic and was unable to tolerate being in the chamber for 90 minutes. I have discussed  with him that his most recent x-ray of the foot shows that he has the distal phalanx of the left great toe showing changes with osteomyelitis cannot be excluded at that site. He tells me that he cannot have an MRI because of his defibrillator and hence we will order a triple phase bone scan. I have also reviewed his culture report which shows several organisms and he has sensitivity to tetracycline and we have recommended he takes this for 14 days and this has been given to him on 05/02/2014 05/12/2014 the bone scan done on 05/10/2014 shows #1 findings are worrisome for osteomyelitis involving the calcaneus of the left foot, #2 increase uptake localizing to the left second and third toe on all 3 phases. Cannot rule out osteomyelitis in this area. And #3 left foot cellulitis. 05/19/2014 -- reviewed several reports which we have received back on [facility]. #1 chest x-ray done on April 21 shows chronic changes in the left base no acute findings. #2 his CBC is within normal limits. #3 hemoglobin A1c was 8.5%. #4 EKG done on 26 April was within normal limits due to his electronic ventricular pacemaker. 05/26/2014 -- he has had his PICC line placed and is taking vancomycin daily basis. He is to start hyperbaric oxygen therapy on this coming Monday. 06/09/2014 -- he saw Dr. Sampson Goon yesterday and besides his IV antibiotic I believe a oral antibiotic was MANI, CELESTIN. ([id number]) also given and this may be Cipro. Patient is feeling fine otherwise does not have any symptoms though his blood pressure this morning in the wound center has been 90/50. We will check his blood pressure in the supine position. 06/16/2014 - 81yo undergoing HBO for Wagner 3 L DFUs and arterial insufficiency. Complained of worsening fatigue yesterday after HBO. Feels better today. Has appointment with PCP this afternoon. He wishes to hold off on HBO until next week. No significant pain. No fever or chills. Stable  drainage. 06/23/2014 Mousa has taken a break from HBO T and has been feeling a little better. He was seen by the nurse practitioner at his PCPs office and at that time his vitals were stable and they did get some EKG done which was within normal  limits. There have sent out some lab work which we still have to receive reports. He is going to see his PCP back this afternoon and will be seeing Dr. Sampson Goon tomorrow for review. addendum: we have received labs from his PCPs office and note that his potassium is high at 5.4 and his BUN/creatinine is slightly raised. His blood sugar was 216. His total protein and albumen were 5.9 and 3 and this was low. His HandH was 10.3 and 31.4 WBC was 8.2 and his platelets were 304. CRP was 29.6 which was high. Vancomycin trough was 19.1 which was normal TSH was 3.11 which was normal 06/30/2014 -- [name] feels weak today and he feels his stomach is acting up and his not feeling up to doing hyperbaric oxygen therapy. He would like to take a break today and tomorrow and resume on Monday. He is on vancomycin and Levaquin as per ID and he has an appointment to see the vascular surgeons in about 10 days' time. 07/07/2014 -- [name] has been feeling weak all along and he has decided that he is not going to continue with hyperbaric oxygen therapy and a longer period. He had finished 16 of his 40 treatments and will not take anymore. He has an appointment with ID tomorrow and also his vascular surgeons and I will have a conference with both of them regarding his further care. 07/14/2014 -- over the last week I have contacted Dr. Sampson Goon and Dr. Wyn Quaker and discussed his care. Dr. Wyn Quaker agrees that he will take him to the OR and debrided his wound and use a wound VAC. Dr. Sampson Goon has altered his anti-biotic regimen - vancomycin has been stopped and he is on oral levofloxacin. The patient's surgery is scheduled for July 7. 07/21/2014 -- he continues to feel unwell and  has a lot of GI disturbances to probably his antibiotics. He also has developed a new blister on his left fourth toe and is concerned about his upcoming surgery which is scheduled for next Thursday. Electronic Signature(s) Signed: 07/21/2014 1:47:03 PM By: Evlyn Kanner MD, FACS Entered By: Evlyn Kanner on 07/21/2014 13:12:19 Balsley, [name] ([id number]) -------------------------------------------------------------------------------- Physical Exam Details Patient Name: Arscott, [name] Date of Service: 07/21/2014 11:45 AM Medical Record Number: [id number] Patient Account Number: [id number] Date of Birth/Sex: 1920-10-12 (81 y.o. Male) Treating RN: Primary Care Physician: Lindwood Qua Other Clinician: Referring Physician: Lindwood Qua Treating Physician/Extender: Rudene Re in Treatment: 12 Constitutional . Pulse regular. Respirations normal and unlabored. Afebrile. . Eyes Nonicteric. Reactive to light. Ears, Nose, Mouth, and Throat Lips, teeth, and gums WNL.Marland Kitchen Moist mucosa without lesions . Neck supple and nontender. No palpable supraclavicular or cervical adenopathy. Normal sized without goiter. Respiratory WNL. No retractions.. Cardiovascular Pedal Pulses WNL. No clubbing, cyanosis or edema. Gastrointestinal (GI) Abdomen without masses or tenderness.. No liver or spleen enlargement or tenderness.. Musculoskeletal Adexa without tenderness or enlargement.. Digits and nails w/o clubbing, cyanosis, infection, petechiae, ischemia, or inflammatory conditions.. Integumentary (Hair, Skin) No suspicious lesions. No crepitus or fluctuance. No peri-wound warmth or erythema. No masses.Marland Kitchen Psychiatric Judgement and insight Intact.. No evidence of depression, anxiety, or agitation.. Notes Most of his ulcerations have now callused over again and needs sharp debridement with a curette. The ankle will be debrided in the OR next week and hence I will not attempt to get much of this  out as it does down to his Achilles tendon and calcaneum. Electronic Signature(s) Signed: 07/21/2014 1:47:03 PM By: Evlyn Kanner MD, FACS Entered By:  Evlyn Kanner on 07/21/2014 13:13:19 KONOR, NOREN ([id number]) -------------------------------------------------------------------------------- Physician Orders Details Patient Name: Karg, [name] Date of Service: 07/21/2014 11:45 AM Medical Record Number: [id number] Patient Account Number: [id number] Date of Birth/Sex: 01/11/1921 (81 y.o. Male) Treating RN: Curtis Sites Primary Care Physician: Lindwood Qua Other Clinician: Referring Physician: Lindwood Qua Treating Physician/Extender: Rudene Re in Treatment: 12 Verbal / Phone Orders: Yes Clinician: Curtis Sites Read Back and Verified: Yes Diagnosis Coding Wound Cleansing Wound #1 Left Achilles o Clean wound with Normal Saline. o May Shower, gently pat wound dry prior to applying new dressing. o May shower with protection. Wound #2 Left,Lateral Calcaneous o Clean wound with Normal Saline. o May Shower, gently pat wound dry prior to applying new dressing. o May shower with protection. Wound #3 Left Toe Great o Clean wound with Normal Saline. o May Shower, gently pat wound dry prior to applying new dressing. o May shower with protection. Skin Barriers/Peri-Wound Care Wound #1 Left Achilles o Skin Prep Wound #2 Left,Lateral Calcaneous o Skin Prep Wound #3 Left Toe Great o Skin Prep Primary Wound Dressing Wound #1 Left Achilles o Aquacel Ag Wound #2 Left,Lateral Calcaneous o Aquacel Ag Wound #3 Left Toe Great o Prisma Ag Secondary Dressing GODFREY, TRITSCHLER. ([id number]) Wound #1 Left Achilles o Gauze and Kerlix/Conform Wound #2 Left,Lateral Calcaneous o Gauze and Kerlix/Conform Wound #3 Left Toe Great o Gauze and Kerlix/Conform Dressing Change Frequency Wound #1 Left Achilles o Change dressing every other  day. Wound #2 Left,Lateral Calcaneous o Change dressing every other day. Wound #3 Left Toe Great o Change dressing every other day. Follow-up Appointments Wound #1 Left Achilles o Return Appointment in 1 week. Wound #2 Left,Lateral Calcaneous o Return Appointment in 1 week. Wound #3 Left Toe Great o Return Appointment in 1 week. Home Health Wound #1 Left Achilles o Continue Home Health Visits - [place] o Home Health Nurse may visit PRN to address patientos wound care needs. o FACE TO FACE ENCOUNTER: MEDICARE and MEDICAID PATIENTS: I certify that this patient is under my care and that I had a face-to-face encounter that meets the physician face-to-face encounter requirements with this patient on this date. The encounter with the patient was in whole or in part for the following MEDICAL CONDITION: (primary reason for Home Healthcare) MEDICAL NECESSITY: I certify, that based on my findings, NURSING services are a medically necessary home health service. HOME BOUND STATUS: I certify that my clinical findings support that this patient is homebound (i.e., Due to illness or injury, pt requires aid of supportive devices such as crutches, cane, wheelchairs, walkers, the use of special transportation or the assistance of another person to leave their place of residence. There is a normal inability to leave the home and doing so requires considerable and taxing effort. Other absences are for medical reasons / religious services and are infrequent or of short duration when for other reasons). o If current dressing causes regression in wound condition, may D/C ordered dressing product/s and apply Normal Saline Moist Dressing daily until next Wound Healing Center / Other MD appointment. Notify Wound Healing Center of regression in wound condition at [phone]. MAURIZIO, GENO ([id number]) o Please direct any NON-WOUND related issues/requests for orders to patient's Primary  Care Physician Wound #2 Left,Lateral Calcaneous o Continue Home Health Visits - [place] o Home Health Nurse may visit PRN to address patientos wound care needs. o FACE TO FACE ENCOUNTER: MEDICARE and MEDICAID PATIENTS: I certify that this patient is under  my care and that I had a face-to-face encounter that meets the physician face-to-face encounter requirements with this patient on this date. The encounter with the patient was in whole or in part for the following MEDICAL CONDITION: (primary reason for Home Healthcare) MEDICAL NECESSITY: I certify, that based on my findings, NURSING services are a medically necessary home health service. HOME BOUND STATUS: I certify that my clinical findings support that this patient is homebound (i.e., Due to illness or injury, pt requires aid of supportive devices such as crutches, cane, wheelchairs, walkers, the use of special transportation or the assistance of another person to leave their place of residence. There is a normal inability to leave the home and doing so requires considerable and taxing effort. Other absences are for medical reasons / religious services and are infrequent or of short duration when for other reasons). o If current dressing causes regression in wound condition, may D/C ordered dressing product/s and apply Normal Saline Moist Dressing daily until next Wound Healing Center / Other MD appointment. Notify Wound Healing Center of regression in wound condition at [phone]. o Please direct any NON-WOUND related issues/requests for orders to patient's Primary Care Physician Wound #3 Left Toe Great o Continue Home Health Visits - [place] o Home Health Nurse may visit PRN to address patientos wound care needs. o FACE TO FACE ENCOUNTER: MEDICARE and MEDICAID PATIENTS: I certify that this patient is under my care and that I had a face-to-face encounter that meets the physician face-to-face encounter requirements  with this patient on this date. The encounter with the patient was in whole or in part for the following MEDICAL CONDITION: (primary reason for Home Healthcare) MEDICAL NECESSITY: I certify, that based on my findings, NURSING services are a medically necessary home health service. HOME BOUND STATUS: I certify that my clinical findings support that this patient is homebound (i.e., Due to illness or injury, pt requires aid of supportive devices such as crutches, cane, wheelchairs, walkers, the use of special transportation or the assistance of another person to leave their place of residence. There is a normal inability to leave the home and doing so requires considerable and taxing effort. Other absences are for medical reasons / religious services and are infrequent or of short duration when for other reasons). o If current dressing causes regression in wound condition, may D/C ordered dressing product/s and apply Normal Saline Moist Dressing daily until next Wound Healing Center / Other MD appointment. Notify Wound Healing Center of regression in wound condition at [phone]. o Please direct any NON-WOUND related issues/requests for orders to patient's Primary Care Physician Electronic Signature(s) Signed: 07/21/2014 12:30:39 PM By: Curtis Sites Signed: 07/21/2014 1:47:03 PM By: Evlyn Kanner MD, FACS KAMERIN, GRUMBINE ([id number]) Entered By: Curtis Sites on 07/21/2014 12:30:39 Kinne, Lenardo [name] ([id number]) -------------------------------------------------------------------------------- Problem List Details Patient Name: Gilkerson, [name] Date of Service: 07/21/2014 11:45 AM Medical Record Number: [id number] Patient Account Number: [id number] Date of Birth/Sex: 10/20/1920 (81 y.o. 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 Date of Service: 07/21/2014 11:45 AM Medical Record Number: [id number] Patient Account Number: [id number] Date of Birth/Sex: Jun 15, 1920 (81 y.o. Male) Treating RN: Primary Care Physician: Lindwood Qua Other Clinician: Referring Physician: Lindwood Qua Treating Physician/Extender: Rudene Re in Treatment: 12 Subjective Chief Complaint Information obtained from Patient Patient presents to the wound care center for a consult due non healing wound 81 year old gentleman who comes with a history of having some ulcerated areas on his heels since February 2016 and then a large injury to his left posterior heel and ankle since about a month. History of Present Illness (HPI) 81 year old gentleman who was known to be a diabetic for many years has peripheral neuropathy recently went to his primary care doctor for a punctured wound on his left heel which was something he had noticed. The patient then was referred to a podiatrist who referred him to Dr. Wyn Quaker in the vascular surgery department and I understand the procedure was done on 03/14/2014 with a left lower extremity  vascular procedure and stenting was done. Details of this are not available at the present time. The patient has been applying Neosporin to his leg and has not had any wound care addressed so far. patient has also had a history of coronary artery disease in the past and hasn't had a CABG and a pacemaker placement in the remote past.Other details and notes are pending. the patient is not in pain lives alone and has some home health and other aides coming to help him with his daily chores and meals. reviewing the vascular notes I understand the procedure was done on 03/14/2014 and he was operated by Dr. Wyn Quaker. he had a catheter placement to the left peroneal artery and a aortogram and selective left lower extremity angiogram was done. He also had a percutaneous transluminal angioplasty of the left peroneal artery and the tibioperoneal trunk. The distal SFA and above-knee popliteal artery were also angioplastied. A subcutaneous stent placement to the distal superficial femoral artery was also done. 05/05/2014 -- the patient has not had any change in his health and after much consideration is decided that he does not want HBOT as he is claustrophobic and was unable to tolerate being in the chamber for 90 minutes. I have discussed with him that his most recent x-ray of the foot shows that he has the distal phalanx of the left great toe showing changes with osteomyelitis cannot be excluded at that site. He tells me that he cannot have an MRI because of his defibrillator and hence we will order a triple phase bone scan. I have also reviewed his culture report which shows several organisms and he has sensitivity to tetracycline and we have recommended he takes this for 14 days and this has been given to him on 05/02/2014 05/12/2014 the bone scan done on 05/10/2014 shows #1 findings are worrisome for osteomyelitis involving the calcaneus of the left foot, #2 increase uptake localizing to the left second  and third toe on all 3 phases. Cannot rule out osteomyelitis in this area. And #3 left foot cellulitis. 05/19/2014 -- reviewed several reports which we have received back on [facility] Coom[facility]. #1 chest x-ray done on April 21 shows chronic changes in the left base no acute findings. Wiegel, Cerrone L. ([id number]) #2  his CBC is within normal limits. #3 hemoglobin A1c was 8.5%. #4 EKG done on 26 April was within normal limits due to his electronic ventricular pacemaker. 05/26/2014 -- he has had his PICC line placed and is taking vancomycin daily basis. He is to start hyperbaric oxygen therapy on this coming Monday. 06/09/2014 -- he saw Dr. Sampson Goon yesterday and besides his IV antibiotic I believe a oral antibiotic was also given and this may be Cipro. Patient is feeling fine otherwise does not have any symptoms though his blood pressure this morning in the wound center has been 90/50. We will check his blood pressure in the supine position. 06/16/2014 - 81yo undergoing HBO for Wagner 3 L DFUs and arterial insufficiency. Complained of worsening fatigue yesterday after HBO. Feels better today. Has appointment with PCP this afternoon. He wishes to hold off on HBO until next week. No significant pain. No fever or chills. Stable drainage. 06/23/2014 Yaiden has taken a break from HBO T and has been feeling a little better. He was seen by the nurse practitioner at his PCPs office and at that time his vitals were stable and they did get some EKG done which was within normal limits. There have sent out some lab work which we still have to receive reports. He is going to see his PCP back this afternoon and will be seeing Dr. Sampson Goon tomorrow for review. addendum: we have received labs from his PCPs office and note that his potassium is high at 5.4 and his BUN/creatinine is slightly raised. His blood sugar was 216. His total protein and albumen were 5.9 and 3 and this was low. His HandH was 10.3 and 31.4  WBC was 8.2 and his platelets were 304. CRP was 29.6 which was high. Vancomycin trough was 19.1 which was normal TSH was 3.11 which was normal 06/30/2014 -- [name] feels weak today and he feels his stomach is acting up and his not feeling up to doing hyperbaric oxygen therapy. He would like to take a break today and tomorrow and resume on Monday. He is on vancomycin and Levaquin as per ID and he has an appointment to see the vascular surgeons in about 10 days' time. 07/07/2014 -- [name] has been feeling weak all along and he has decided that he is not going to continue with hyperbaric oxygen therapy and a longer period. He had finished 16 of his 40 treatments and will not take anymore. He has an appointment with ID tomorrow and also his vascular surgeons and I will have a conference with both of them regarding his further care. 07/14/2014 -- over the last week I have contacted Dr. Sampson Goon and Dr. Wyn Quaker and discussed his care. Dr. Wyn Quaker agrees that he will take him to the OR and debrided his wound and use a wound VAC. Dr. Sampson Goon has altered his anti-biotic regimen - vancomycin has been stopped and he is on oral levofloxacin. The patient's surgery is scheduled for July 7. 07/21/2014 -- he continues to feel unwell and has a lot of GI disturbances to probably his antibiotics. He also has developed a new blister on his left fourth toe and is concerned about his upcoming surgery which is scheduled for next Thursday. Mecum, [name] ([id number]) Objective Constitutional Pulse regular. Respirations normal and unlabored. Afebrile. Vitals Time Taken: 12:06 PM, Height: 70 in, Weight: 200 lbs, BMI: 28.7, Temperature: 97.9 F, Pulse: 84 bpm, Respiratory Rate: 18 breaths/min, Blood Pressure: 105/48 mmHg. Eyes Nonicteric. Reactive to light. Ears, Nose, Mouth, and  Throat Lips, teeth, and gums WNL.Marland Kitchen Moist mucosa without lesions . Neck supple and nontender. No palpable supraclavicular or cervical  adenopathy. Normal sized without goiter. Respiratory WNL. No retractions.. Cardiovascular Pedal Pulses WNL. No clubbing, cyanosis or edema. Gastrointestinal (GI) Abdomen without masses or tenderness.. No liver or spleen enlargement or tenderness.. Musculoskeletal Adexa without tenderness or enlargement.. Digits and nails w/o clubbing, cyanosis, infection, petechiae, ischemia, or inflammatory conditions.Marland Kitchen Psychiatric Judgement and insight Intact.. No evidence of depression, anxiety, or agitation.. General Notes: Most of his ulcerations have now callused over again and needs sharp debridement with a curette. The ankle will be debrided in the OR next week and hence I will not attempt to get much of this out as it does down to his Achilles tendon and calcaneum. Integumentary (Hair, Skin) No suspicious lesions. No crepitus or fluctuance. No peri-wound warmth or erythema. No masses.. Wound #1 status is Open. Original cause of wound was Gradually Appeared. The wound is located on the Left Achilles. The wound measures 7.5cm length x 5.2cm width x 0.5cm depth; 30.631cm^2 area and 15.315cm^3 volume. Wound #2 status is Open. Original cause of wound was Gradually Appeared. The wound is located on the Left,Lateral Calcaneous. The wound measures 0.5cm length x 3.2cm width x 0.2cm depth; 1.257cm^2 area and 0.251cm^3 volume. Lechuga, Antaeus L. ([id number]) Wound #3 status is Open. Original cause of wound was Gradually Appeared. The wound is located on the Left Toe Great. The wound measures 0.7cm length x 0.9cm width x 0.1cm depth; 0.495cm^2 area and 0.049cm^3 volume. 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 He is going to be scheduled for a surgical debridement soon in the OR next Thursday and possibly will have a wound VAC applied during that visit. He will come back and see me next week Procedures Wound #2 Wound #2 is an Arterial Insufficiency Ulcer located on the Left,Lateral Calcaneous . There was a Skin/Subcutaneous Tissue Debridement ([id number]) debridement with total area of 1.6 sq cm performed by Tristan Schroeder., MD. with the following instrument(s): Curette to remove Viable and Non-Viable tissue/material including Fibrin/Slough and Subcutaneous after achieving pain control using Lidocaine 4% Topical Solution. A time out was conducted prior to the start of the procedure. A Minimum amount of bleeding was controlled with Pressure. The procedure was tolerated well with a pain level of 0 throughout and a pain level of 0 following the procedure. Post Debridement Measurements: 0.5cm length x 3.2cm width x 0.2cm depth; 0.251cm^3 volume. Wound #3 Wound #3 is an Arterial Insufficiency Ulcer located on the Left Toe Great . There was a Skin/Subcutaneous Tissue Debridement ([id number]) debridement with total area of 0.63 sq cm performed by Tristan Schroeder., MD. with the following instrument(s): Curette to remove Viable and Non-Viable tissue/material including Fibrin/Slough and Subcutaneous after achieving pain control using Lidocaine 4% Topical Solution. A time out was conducted prior to the start of the procedure. A Minimum amount of bleeding was controlled with Pressure. The procedure was tolerated well with a pain level of 0 throughout and a pain level of 0 following the procedure.  Post Debridement Measurements: 0.7cm length x 0.9cm width x 0.1cm depth; 0.049cm^3 Greenhaw, Elin L. ([id number]) volume. Plan Wound  Cleansing: Wound #1 Left Achilles: Clean wound with Normal Saline. May Shower, gently pat wound dry prior to applying new dressing. May shower with protection. Wound #2 Left,Lateral Calcaneous: Clean wound with Normal Saline. May Shower, gently pat wound dry prior to applying new dressing. May shower with protection. Wound #3 Left Toe Great: Clean wound with Normal Saline. May Shower, gently pat wound dry prior to applying new dressing. May shower with protection. Skin Barriers/Peri-Wound Care: Wound #1 Left Achilles: Skin Prep Wound #2 Left,Lateral Calcaneous: Skin Prep Wound #3 Left Toe Great: Skin Prep Primary Wound Dressing: Wound #1 Left Achilles: Aquacel Ag Wound #2 Left,Lateral Calcaneous: Aquacel Ag Wound #3 Left Toe Great: Prisma Ag Secondary Dressing: Wound #1 Left Achilles: Gauze and Kerlix/Conform Wound #2 Left,Lateral Calcaneous: Gauze and Kerlix/Conform Wound #3 Left Toe Great: Gauze and Kerlix/Conform Dressing Change Frequency: Wound #1 Left Achilles: Change dressing every other day. Wound #2 Left,Lateral Calcaneous: Change dressing every other day. Wound #3 Left Toe Great: Change dressing every other day. Follow-up Appointments: ALIKA, SALADIN ([id number]) Wound #1 Left Achilles: Return Appointment in 1 week. Wound #2 Left,Lateral Calcaneous: Return Appointment in 1 week. Wound #3 Left Toe Great: Return Appointment in 1 week. Home Health: Wound #1 Left Achilles: Continue Home Health Visits - [facility] Nurse may visit PRN to address patient s wound care needs. FACE TO FACE ENCOUNTER: MEDICARE and MEDICAID PATIENTS: I certify that this patient is under my care and that I had a face-to-face encounter that meets the physician face-to-face encounter requirements with this patient on this date. The encounter with the patient was in whole or in part for the following MEDICAL CONDITION: (primary reason for Home Healthcare) MEDICAL NECESSITY:  I certify, that based on my findings, NURSING services are a medically necessary home health service. HOME BOUND STATUS: I certify that my clinical findings support that this patient is homebound (i.e., Due to illness or injury, pt requires aid of supportive devices such as crutches, cane, wheelchairs, walkers, the use of special transportation or the assistance of another person to leave their place of residence. There is a normal inability to leave the home and doing so requires considerable and taxing effort. Other absences are for medical reasons / religious services and are infrequent or of short duration when for other reasons). If current dressing causes regression in wound condition, may D/C ordered dressing product/s and apply Normal Saline Moist Dressing daily until next Wound Healing Center / Other MD appointment. Notify Wound Healing Center of regression in wound condition at [phone]. Please direct any NON-WOUND related issues/requests for orders to patient's Primary Care Physician Wound #2 Left,Lateral Calcaneous: Continue Home Health Visits - [facility] Nurse may visit PRN to address patient s wound care needs. FACE TO FACE ENCOUNTER: MEDICARE and MEDICAID PATIENTS: I certify that this patient is under my care and that I had a face-to-face encounter that meets the physician face-to-face encounter requirements with this patient on this date. The encounter with the patient was in whole or in part for the following MEDICAL CONDITION: (primary reason for Home Healthcare) MEDICAL NECESSITY: I certify, that based on my findings, NURSING services are a medically necessary home health service. HOME BOUND STATUS: I certify that my clinical findings support that this patient is homebound (i.e., Due to illness or injury, pt requires aid of supportive devices such as crutches, cane, wheelchairs,  walkers, the use of special transportation or the assistance of another person to  leave their place of residence. There is a normal inability to leave the home and doing so requires considerable and taxing effort. Other absences are for medical reasons / religious services and are infrequent or of short duration when for other reasons). If current dressing causes regression in wound condition, may D/C ordered dressing product/s and apply Normal Saline Moist Dressing daily until next Wound Healing Center / Other MD appointment. Notify Wound Healing Center of regression in wound condition at [phone]. Please direct any NON-WOUND related issues/requests for orders to patient's Primary Care Physician Wound #3 Left Toe Great: Continue Home Health Visits - [facility] Nurse may visit PRN to address patient s wound care needs. FACE TO FACE ENCOUNTER: MEDICARE and MEDICAID PATIENTS: I certify that this patient is under my care and that I had a face-to-face encounter that meets the physician face-to-face encounter requirements with this patient on this date. The encounter with the patient was in whole or in part for the following MEDICAL CONDITION: (primary reason for Home Healthcare) MEDICAL NECESSITY: I certify, that based on my findings, NURSING services are a medically necessary home health service. HOME BOUND STATUS: I certify that my clinical findings support that this patient is homebound (i.e., Due to illness or injury, pt requires aid of supportive devices such as crutches, cane, wheelchairs, walkers, the use Florer, Mohammed L. ([id number]) of special transportation or the assistance of another person to leave their place of residence. There is a normal inability to leave the home and doing so requires considerable and taxing effort. Other absences are for medical reasons / religious services and are infrequent or of short duration when for other reasons). If current dressing causes regression in wound condition, may D/C ordered dressing product/s and apply Normal  Saline Moist Dressing daily until next Wound Healing Center / Other MD appointment. Notify Wound Healing Center of regression in wound condition at [phone]. Please direct any NON-WOUND related issues/requests for orders to patient's Primary Care Physician We will continue dressing with Prisma AG to his left big toe and Aquacel to the rest of his wounds. He is going to be scheduled for a surgical debridement soon in the OR next Thursday and possibly will have a wound VAC applied during that visit. He will come back and see me next week Electronic Signature(s) Signed: 07/21/2014 1:47:03 PM By: Evlyn Kanner MD, FACS Entered By: Evlyn Kanner on 07/21/2014 13:14:49 Azam, [name] ([id number]) -------------------------------------------------------------------------------- SuperBill Details Patient Name: Dorris, [name] Date of Service: 07/21/2014 Medical Record Number: [id number] Patient Account Number: [id number] Date of Birth/Sex: 25-Jun-1920 (81 y.o. 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 M86.372 Chronic multifocal osteomyelitis, left ankle and foot Facility Procedures CPT4: Description Modifier Quantity Code [id number] 11042 - DEB SUBQ TISSUE 20 SQ CM/< 1 ICD-10 Description Diagnosis E11.621 Type 2 diabetes mellitus with foot ulcer E11.52 Type 2 diabetes mellitus with diabetic peripheral angiopathy with gangrene I70.244  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 L97.323  Non-pressure chronic ulcer of left ankle  with necrosis of muscle Physician Procedures CPT4: Description Modifier Quantity Code [id number] 11042 - WC PHYS SUBQ TISS 20 SQ CM 1 ICD-10 Description Diagnosis E11.621 Type 2 diabetes mellitus with foot ulcer E11.52 Type 2 diabetes mellitus with diabetic peripheral angiopathy with gangrene I70.244  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

## 2014-07-22 NOTE — Progress Notes (Signed)
FREEDOM, [name] ([id number]) Visit Report for 07/21/2014 Arrival Information Details Patient Name: [name], [name]. Date of Service: 07/21/2014 11:45 AM Medical Record Number: [id number] Patient Account Number: [id number] Date of Birth/Sex: January 16, 1921 (81 y.o. Male) Treating RN: Curtis Sites Primary Care Physician: Lindwood Qua Other Clinician: Referring Physician: Lindwood Qua Treating Physician/Extender: Rudene Re in Treatment: 12 Visit Information History Since Last Visit Added or deleted any medications: No Patient Arrived: Wheel Chair Any new allergies or adverse reactions: No Arrival Time: 12:06 Had a fall or experienced change in No activities of daily living that may affect Accompanied By: son risk of falls: Transfer Assistance: None Signs or symptoms of abuse/neglect since last No Patient Identification Verified: Yes visito Secondary Verification Process Yes Hospitalized since last visit: No Completed: Pain Present Now: No Patient Requires Transmission-Based No Precautions: Patient Has Alerts: No Electronic Signature(s) Signed: 07/21/2014 4:45:24 PM By: Curtis Sites Entered By: Curtis Sites on 07/21/2014 12:06:54 Spratt, [name] ([id number]) -------------------------------------------------------------------------------- Encounter Discharge Information Details Patient Name: [name], [name] Date of Service: 07/21/2014 11:45 AM Medical Record Number: [id number] Patient Account Number: [id number] Date of Birth/Sex: 28-Nov-1920 (81 y.o. Male) Treating RN: Curtis Sites Primary Care Physician: Lindwood Qua Other Clinician: Referring Physician: Lindwood Qua Treating Physician/Extender: Rudene Re in Treatment: 12 Encounter Discharge Information Items Discharge Pain Level: 0 Discharge Condition: Stable Ambulatory Status: Wheelchair Discharge Destination: Home Private Transportation: Auto Accompanied By: son Schedule Follow-up  Appointment: Yes Medication Reconciliation completed and No provided to Patient/Care Naima Veldhuizen: Clinical Summary of Care: Electronic Signature(s) Signed: 07/21/2014 2:02:50 PM By: Curtis Sites Entered By: Curtis Sites on 07/21/2014 14:02:50 Jane, [name] ([id number]) -------------------------------------------------------------------------------- Lower Extremity Assessment Details Patient Name: [name], [name] Date of Service: 07/21/2014 11:45 AM Medical Record Number: [id number] Patient Account Number: [id number] Date of Birth/Sex: 11-Oct-1920 (81 y.o. Male) Treating RN: Curtis Sites Primary Care Physician: Lindwood Qua Other Clinician: Referring Physician: Lindwood Qua Treating Physician/Extender: Rudene Re in Treatment: 12 Vascular Assessment Pulses: Posterior Tibial Palpable: [Left:No] Doppler: [Left:Monophasic] Dorsalis Pedis Palpable: [Left:No] Doppler: [Left:Monophasic] Extremity colors, hair growth, and conditions: Extremity Color: [Left:Pale] Hair Growth on Extremity: [Left:No] Temperature of Extremity: [Left:Cool] Capillary Refill: [Left:< 3 seconds] Electronic Signature(s) Signed: 07/21/2014 4:45:24 PM By: Curtis Sites Entered By: Curtis Sites on 07/21/2014 12:17:34 Bega, [name] ([id number]) -------------------------------------------------------------------------------- Multi Wound Chart Details Patient Name: [name], [name] Date of Service: 07/21/2014 11:45 AM Medical Record Number: [id number] Patient Account Number: [id number] Date of Birth/Sex: 1920/10/21 (81 y.o. 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] Date Acquired: [1:01/24/2014] [2:01/24/2014] [3:01/24/2014] Weeks of Treatment: [1:12] [2:12] [3:12] Wound Status: [1:Open] [2:Open] [3:Open] Clustered Wound: [1:No] [2:Yes] [3:No] Pending Amputation on Yes [2:No] [3:No] Presentation: Measurements L x W x D 7.5x5.2x0.5 [2:0.5x3.2x0.2] [3:0.7x0.9x0.1] (cm) Area (cm) : [1:30.631] [2:1.257] [3:0.495] Volume (cm) : [1:15.315] [2:0.251] [3:0.049] % Reduction in Area: [1:-23.90%] [2:77.80%] [3:42.70%] % Reduction in Volume: -106.40% [2:55.60%] [3:43.00%] Classification: [1:Full Thickness With Exposed Support Structures] [2:Full Thickness Without Exposed Support Structures] [3:Unclassifiable] Periwound Skin Texture: No Abnormalities Noted No Abnormalities Noted No Abnormalities Noted Periwound Skin [1:No Abnormalities  Noted No Abnormalities Noted No Abnormalities Noted] Moisture: Periwound Skin Color: No Abnormalities Noted No Abnormalities Noted No Abnormalities Noted Tenderness on [1:No] [2:No] [3:No] Treatment Notes Electronic Signature(s) [name], [name] ([id number]) Signed: 07/21/2014 12:19:48 PM By: Curtis Sites Entered By: Curtis Sites on 07/21/2014 12:19:48 [name], [name] ([id number]) -------------------------------------------------------------------------------- Multi-Disciplinary Care Plan Details Patient Name: [name], [name] Date of Service: 07/21/2014 11:45 AM Medical Record Number: [id number] Patient Account Number: [id number] Date of Birth/Sex: 11-Sep-1920 (81 y.o. Male) Treating RN: Curtis Sites Primary Care Physician: Lindwood Qua Other Clinician: Referring Physician:  Lindwood Qua Treating Physician/Extender: Rudene Re in Treatment: 12 Active Inactive Abuse / Safety / Falls / Self Care Management Nursing Diagnoses: Potential for falls Goals: Patient will remain injury free Date Initiated: 04/28/2014 Goal Status: Active Interventions: Assess fall risk on admission and as needed Notes: Necrotic Tissue Nursing Diagnoses: Impaired tissue integrity related to necrotic/devitalized tissue Goals: Necrotic/devitalized tissue will be minimized in the wound bed Date Initiated: 04/28/2014 Goal Status: Active Interventions: Provide education on necrotic tissue and debridement process Treatment Activities: Apply topical anesthetic as ordered : 07/21/2014 Notes: Nutrition Nursing Diagnoses: Potential for alteratiion in Nutrition/Potential for imbalanced nutrition Syring, Mazen L. ([id number]) Goals: Patient/caregiver agrees to and verbalizes understanding of need to use nutritional supplements and/or vitamins as prescribed Date Initiated: 04/28/2014 Goal Status: Active Interventions: Assess patient nutrition upon admission and as needed per policy Notes: Orientation to the Wound Care Program Nursing Diagnoses: Knowledge deficit related to the wound healing center program Goals: Patient/caregiver will verbalize understanding of the Wound Healing Center Program Date Initiated: 04/28/2014 Goal Status: Active Interventions: Provide education on orientation to the wound center Notes: Wound/Skin Impairment Nursing Diagnoses: Impaired tissue integrity Goals: Ulcer/skin breakdown will have a volume reduction of 30% by week 4 Date Initiated: 04/28/2014 Goal Status: Active Interventions: Assess ulceration(s) every visit Notes: Electronic Signature(s) Signed: 07/21/2014 12:19:40 PM By: Curtis Sites Entered By: Curtis Sites on 07/21/2014 12:19:39 Machnik, [name]  ([id number]) -------------------------------------------------------------------------------- Patient/Caregiver Education Details Patient Name: [name], [name] Date of Service: 07/21/2014 11:45 AM Medical Record Number: [id number] Patient Account Number: [id number] Date of Birth/Gender: 1920-06-30 (81 y.o. Male) Treating RN: Curtis Sites Primary Care Physician: Lindwood Qua Other Clinician: Referring Physician: Lindwood Qua Treating Physician/Extender: Rudene Re in Treatment: 12 Education Assessment Education Provided To: Patient and Caregiver Education Topics Provided Wound/Skin Impairment: Handouts: Other: wound care to continue as ordered Methods: Demonstration, Explain/Verbal Responses: State content correctly Electronic Signature(s) Signed: 07/21/2014 2:03:08 PM By: Curtis Sites Entered By: Curtis Sites on 07/21/2014 14:03:08 Fojtik, Kinsler [name] ([id number]) -------------------------------------------------------------------------------- Wound Assessment Details Patient Name: [name], [name] Date of Service: 07/21/2014 11:45 AM Medical Record Number: [id number] Patient Account Number: [id number] Date of Birth/Sex: 06-Jul-1920 (81 y.o. Male) Treating RN: Curtis Sites Primary Care Physician: Lindwood Qua Other Clinician: Referring Physician: Lindwood Qua Treating Physician/Extender: Rudene Re in Treatment: 12 Wound Status Wound Number: 1 Primary Etiology: Arterial Insufficiency Ulcer Wound Location: Left Achilles Wound Status: Open Wounding Event: Gradually Appeared Date Acquired: 01/24/2014 Weeks Of Treatment: 12 Clustered Wound: No Pending Amputation On Presentation Photos Photo Uploaded By: Curtis Sites on 07/21/2014 15:31:10 Wound Measurements Length: (cm) 7.5 Width: (cm) 5.2 Depth: (cm) 0.5 Area: (cm) 30.631 Volume: (cm) 15.315 % Reduction in Area: -23.9% % Reduction in Volume: -106.4% Wound Description Full Thickness  With Exposed Support Classification: Structures Periwound Skin Texture Texture Color No Abnormalities Noted: No No Abnormalities Noted: No Moisture No Abnormalities Noted: No Treatment Notes Court, Syd L. ([id number]) Wound #1 (Left Achilles)  1. Cleansed with: Clean wound with Normal Saline 2. Anesthetic Topical Lidocaine 4% cream to wound bed prior to debridement 4. Dressing Applied: Aquacel Ag 5. Secondary Dressing Applied ABD and Kerlix/Conform 7. Secured with Secretary/administratorTape Electronic Signature(s) Signed: 07/21/2014 4:45:24 PM By: Curtis Sitesorthy, Joanna Entered By: Curtis Sitesorthy, Joanna on 07/21/2014 12:16:55 Buesing, [name] ([id number]) -------------------------------------------------------------------------------- Wound Assessment Details Patient Name: [name], [name] Date of Service: 07/21/2014 11:45 AM Medical Record Number: [id number] Patient Account Number: [id number] Date of Birth/Sex: July 11, 1920 [age] y.o. Male) Treating RN: Curtis Sitesorthy, Joanna Primary Care Physician: [name], [name] Other Clinician: Referring Physician: [name], [name] Treating Physician/Extender: Rudene ReBritto, Errol Weeks in Treatment: 12 Wound Status Wound Number: 2 Primary Etiology: Arterial Insufficiency Ulcer Wound Location: Left, Lateral Calcaneous Wound Status: Open Wounding Event: Gradually Appeared Date Acquired: 01/24/2014 Weeks Of Treatment: 12 Clustered Wound: Yes Photos Photo Uploaded By: Curtis Sitesorthy, Joanna on 07/21/2014 15:31:11 Wound Measurements Length: (cm) 0.5 Width: (cm) 3.2 Depth: (cm) 0.2 Area: (cm) 1.257 Volume: (cm) 0.251 % Reduction in Area: 77.8% % Reduction in Volume: 55.6% Wound Description Full Thickness Without Exposed Classification: Support Structures Periwound Skin Texture Texture Color No Abnormalities Noted: No No Abnormalities Noted: No Moisture No Abnormalities Noted: No Treatment Notes Wound #2 (Left, Lateral Calcaneous) Helfand, Nestor L. ([id number]) 1. Cleansed  with: Clean wound with Normal Saline 2. Anesthetic Topical Lidocaine 4% cream to wound bed prior to debridement 4. Dressing Applied: Aquacel Ag 5. Secondary Dressing Applied ABD and Kerlix/Conform 7. Secured with Secretary/administratorTape Electronic Signature(s) Signed: 07/21/2014 4:45:24 PM By: Curtis Sitesorthy, Joanna Entered By: Curtis Sitesorthy, Joanna on 07/21/2014 12:16:55 Alcocer, [name] ([id number]) -------------------------------------------------------------------------------- Wound Assessment Details Patient Name: [name], [name] Date of Service: 07/21/2014 11:45 AM Medical Record Number: [id number] Patient Account Number: [id number] Date of Birth/Sex: July 11, 1920 [age] y.o. Male) Treating RN: Curtis Sitesorthy, Joanna Primary Care Physician: [name], [name] Other Clinician: Referring Physician: [name], [name] Treating Physician/Extender: Rudene ReBritto, Errol Weeks in Treatment: 12 Wound Status Wound Number: 3 Primary Etiology: Arterial Insufficiency Ulcer Wound Location: Left Toe Great Wound Status: Open Wounding Event: Gradually Appeared Date Acquired: 01/24/2014 Weeks Of Treatment: 12 Clustered Wound: No Photos Photo Uploaded By: Curtis Sitesorthy, Joanna on 07/21/2014 15:31:44 Wound Measurements Length: (cm) 0.7 Width: (cm) 0.9 Depth: (cm) 0.1 Area: (cm) 0.495 Volume: (cm) 0.049 % Reduction in Area: 42.7% % Reduction in Volume: 43% Wound Description Classification: Unclassifiable Periwound Skin Texture Texture Color No Abnormalities Noted: No No Abnormalities Noted: No Moisture No Abnormalities Noted: No Treatment Notes Wound #3 (Left Toe Great) 1. Cleansed with: Sax, Tranell L. ([id number]) Clean wound with Normal Saline 4. Dressing Applied: Prisma Ag 5. Secondary Dressing Applied Dry Gauze 7. Secured with Secretary/administratorTape Electronic Signature(s) Signed: 07/21/2014 4:45:24 PM By: Curtis Sitesorthy, Joanna Entered By: Curtis Sitesorthy, Joanna on 07/21/2014 12:16:55 Moskowitz, [name]  ([id number]) -------------------------------------------------------------------------------- Vitals Details Patient Name: [name], [name] Date of Service: 07/21/2014 11:45 AM Medical Record Number: [id number] Patient Account Number: [id number] Date of Birth/Sex: July 11, 1920 [age] y.o. Male) Treating RN: Curtis Sitesorthy, Joanna Primary Care Physician: [name], [name] Other Clinician: Referring Physician: [name], [name] Treating Physician/Extender: Rudene ReBritto, Errol Weeks in Treatment: 12 Vital Signs Time Taken: 12:06 Temperature (F): 97.9 Height (in): 70 Pulse (bpm): 84 Weight (lbs): 200 Respiratory Rate (breaths/min): 18 Body Mass Index (BMI): 28.7 Blood Pressure (mmHg): 105/48 Reference Range: 80 - 120 mg / dl Electronic Signature(s) Signed: 07/21/2014 4:45:24 PM By: Curtis Sitesorthy, Joanna Entered By: Curtis Sitesorthy, Joanna on 07/21/2014 12:08:17

## 2014-07-28 ENCOUNTER — Other Ambulatory Visit: Payer: Medicare Other

## 2014-07-28 ENCOUNTER — Ambulatory Visit: Payer: Medicare Other | Admitting: Anesthesiology

## 2014-07-28 ENCOUNTER — Encounter: Payer: Self-pay | Admitting: *Deleted

## 2014-07-28 ENCOUNTER — Ambulatory Visit
Admission: RE | Admit: 2014-07-28 | Discharge: 2014-07-28 | Disposition: A | Discharge status: Home / Self Care | Payer: Medicare Other | Source: Ambulatory Visit | Attending: Vascular Surgery | Admitting: Vascular Surgery

## 2014-07-28 ENCOUNTER — Encounter
Admission: RE | Disposition: A | Discharge status: Home / Self Care | Payer: Self-pay | Source: Ambulatory Visit | Attending: Vascular Surgery

## 2014-07-28 DIAGNOSIS — N400 Enlarged prostate without lower urinary tract symptoms: Secondary | ICD-10-CM | POA: Insufficient documentation

## 2014-07-28 DIAGNOSIS — E119 Type 2 diabetes mellitus without complications: Secondary | ICD-10-CM | POA: Insufficient documentation

## 2014-07-28 DIAGNOSIS — I252 Old myocardial infarction: Secondary | ICD-10-CM | POA: Insufficient documentation

## 2014-07-28 DIAGNOSIS — I70202 Unspecified atherosclerosis of native arteries of extremities, left leg: Secondary | ICD-10-CM | POA: Insufficient documentation

## 2014-07-28 DIAGNOSIS — Z87891 Personal history of nicotine dependence: Secondary | ICD-10-CM | POA: Insufficient documentation

## 2014-07-28 DIAGNOSIS — Z79899 Other long term (current) drug therapy: Secondary | ICD-10-CM | POA: Insufficient documentation

## 2014-07-28 DIAGNOSIS — L97421 Non-pressure chronic ulcer of left heel and midfoot limited to breakdown of skin: Secondary | ICD-10-CM | POA: Insufficient documentation

## 2014-07-28 DIAGNOSIS — I509 Heart failure, unspecified: Secondary | ICD-10-CM | POA: Diagnosis not present

## 2014-07-28 DIAGNOSIS — Z950 Presence of cardiac pacemaker: Secondary | ICD-10-CM | POA: Diagnosis not present

## 2014-07-28 DIAGNOSIS — Z951 Presence of aortocoronary bypass graft: Secondary | ICD-10-CM | POA: Diagnosis not present

## 2014-07-28 DIAGNOSIS — L97429 Non-pressure chronic ulcer of left heel and midfoot with unspecified severity: Secondary | ICD-10-CM | POA: Diagnosis present

## 2014-07-28 DIAGNOSIS — I10 Essential (primary) hypertension: Secondary | ICD-10-CM | POA: Diagnosis not present

## 2014-07-28 HISTORY — PX: WOUND DEBRIDEMENT: SHX247

## 2014-07-28 HISTORY — PX: APPLICATION OF WOUND VAC: SHX5189

## 2014-07-28 LAB — GLUCOSE, CAPILLARY
GLUCOSE-CAPILLARY: 139 mg/dL — AB (ref 65–99)
GLUCOSE-CAPILLARY: 141 mg/dL — AB (ref 65–99)

## 2014-07-28 LAB — POTASSIUM: Potassium, Bld: 5

## 2014-07-28 SURGERY — DEBRIDEMENT, WOUND
Anesthesia: Monitor Anesthesia Care | Laterality: Left

## 2014-07-28 MED ORDER — CEFAZOLIN SODIUM 1-5 GM-% IV SOLN
INTRAVENOUS | Status: AC
  Administered 2014-07-28: 1 g via INTRAVENOUS
  Filled 2014-07-28: qty 50

## 2014-07-28 MED ORDER — FAMOTIDINE 20 MG PO TABS
ORAL_TABLET | ORAL | Status: AC
  Administered 2014-07-28: 20 mg via ORAL
  Filled 2014-07-28: qty 1

## 2014-07-28 MED ORDER — FENTANYL CITRATE (PF) 100 MCG/2ML IJ SOLN: 25.0000 ug | INTRAMUSCULAR | Status: DC | PRN

## 2014-07-28 MED ORDER — BUPIVACAINE HCL (PF) 0.5 % IJ SOLN
INTRAMUSCULAR | Status: AC
  Filled 2014-07-28: qty 30

## 2014-07-28 MED ORDER — OXYCODONE HCL 5 MG/5ML PO SOLN: 5.0000 mg | Freq: Once | ORAL | Status: DC | PRN

## 2014-07-28 MED ORDER — CEFAZOLIN SODIUM 1-5 GM-% IV SOLN
1.0000 g | Freq: Once | INTRAVENOUS | Status: AC
  Administered 2014-07-28: 1 g via INTRAVENOUS

## 2014-07-28 MED ORDER — SODIUM CHLORIDE 0.9 % IV SOLN
INTRAVENOUS | Status: DC
  Administered 2014-07-28 (×2): via INTRAVENOUS

## 2014-07-28 MED ORDER — FAMOTIDINE 20 MG PO TABS
20.0000 mg | ORAL_TABLET | Freq: Once | ORAL | Status: AC
  Administered 2014-07-28: 20 mg via ORAL

## 2014-07-28 MED ORDER — FENTANYL CITRATE (PF) 100 MCG/2ML IJ SOLN
INTRAMUSCULAR | Status: DC | PRN
  Administered 2014-07-28 (×2): 25 ug via INTRAVENOUS

## 2014-07-28 MED ORDER — OXYCODONE HCL 5 MG PO TABS: 5.0000 mg | ORAL_TABLET | Freq: Once | ORAL | Status: DC | PRN

## 2014-07-28 MED ORDER — HYDROCODONE-ACETAMINOPHEN 5-325 MG PO TABS: 1.0000 | ORAL_TABLET | Freq: Four times a day (QID) | ORAL | Status: DC | PRN

## 2014-07-28 MED ORDER — MIDAZOLAM HCL 2 MG/2ML IJ SOLN
INTRAMUSCULAR | Status: DC | PRN
  Administered 2014-07-28 (×2): .5 mg via INTRAVENOUS

## 2014-07-28 MED ORDER — LIDOCAINE HCL (PF) 1 % IJ SOLN
INTRAMUSCULAR | Status: AC
  Filled 2014-07-28: qty 30

## 2014-07-28 SURGICAL SUPPLY — 35 items
BRUSH SCRUB 4% CHG (MISCELLANEOUS) ×3 IMPLANT
CANISTER SUCT 1200ML W/VALVE (MISCELLANEOUS) ×3 IMPLANT
CHLORAPREP W/TINT 26ML (MISCELLANEOUS) ×3 IMPLANT
DRAPE INCISE IOBAN 66X45 STRL (DRAPES) ×3 IMPLANT
DRAPE LAPAROTOMY T 102X78X121 (DRAPES) ×3 IMPLANT
DRSG VAC ATS MED SENSATRAC (GAUZE/BANDAGES/DRESSINGS) ×3 IMPLANT
ELECT CAUTERY BLADE 6.4 (BLADE) ×3 IMPLANT
GLOVE BIO SURGEON STRL SZ7 (GLOVE) ×3 IMPLANT
GOWN STRL REUS W/ TWL LRG LVL3 (GOWN DISPOSABLE) ×1 IMPLANT
GOWN STRL REUS W/ TWL XL LVL3 (GOWN DISPOSABLE) ×1 IMPLANT
GOWN STRL REUS W/TWL LRG LVL3 (GOWN DISPOSABLE) ×3
GOWN STRL REUS W/TWL XL LVL3 (GOWN DISPOSABLE) ×3
HANDPIECE SUCTION TUBG SURGILV (MISCELLANEOUS) ×3 IMPLANT
IV NS 1000ML (IV SOLUTION) ×3
IV NS 1000ML BAXH (IV SOLUTION) ×1 IMPLANT
KIT RM TURNOVER STRD PROC AR (KITS) ×3 IMPLANT
LABEL OR SOLS (LABEL) ×3 IMPLANT
NS IRRIG 1000ML POUR BTL (IV SOLUTION) ×3 IMPLANT
NS IRRIG 500ML POUR BTL (IV SOLUTION) ×3 IMPLANT
PACK BASIN MINOR ARMC (MISCELLANEOUS) ×1 IMPLANT
PACK EXTREMITY ARMC (MISCELLANEOUS) ×3 IMPLANT
PAD GROUND ADULT SPLIT (MISCELLANEOUS) ×3 IMPLANT
PAD PREP 24X41 OB/GYN DISP (PERSONAL CARE ITEMS) ×3 IMPLANT
SOL PREP PVP 2OZ (MISCELLANEOUS) ×3
SOLUTION PREP PVP 2OZ (MISCELLANEOUS) ×1 IMPLANT
SPONGE LAP 18X18 5 PK (GAUZE/BANDAGES/DRESSINGS) ×3 IMPLANT
STOCKINETTE IMPERVIOUS 9X36 MD (GAUZE/BANDAGES/DRESSINGS) ×2 IMPLANT
SUT ETHILON 4-0 (SUTURE)
SUT ETHILON 4-0 FS2 18XMFL BLK (SUTURE)
SUT VIC AB 3-0 SH 27 (SUTURE)
SUT VIC AB 3-0 SH 27X BRD (SUTURE) ×2 IMPLANT
SUTURE ETHLN 4-0 FS2 18XMF BLK (SUTURE) ×1 IMPLANT
SWAB CULTURE AMIES ANAERIB BLU (MISCELLANEOUS) ×1 IMPLANT
SYR BULB EAR ULCER 3OZ GRN STR (SYRINGE) ×3 IMPLANT
WND VAC CANISTER 500ML (MISCELLANEOUS) ×1 IMPLANT

## 2014-07-28 NOTE — Anesthesia Preprocedure Evaluation (Addendum)
Anesthesia Evaluation  Patient identified by MRN, date of birth, ID band Patient awake    Reviewed: Allergy & Precautions, H&P , NPO status , Patient's Chart, lab work & pertinent test results, reviewed documented beta blocker date and time   History of Anesthesia Complications Negative for: history of anesthetic complications  Airway Mallampati: II  TM Distance: >3 FB Neck ROM: limited    Dental  (+) Upper Dentures, Lower Dentures   Pulmonary COPDformer smoker,  breath sounds clear to auscultation  Pulmonary exam normal       Cardiovascular hypertension, + CAD, + Past MI, + Peripheral Vascular Disease and +CHF Normal cardiovascular exam+ dysrhythmias + pacemaker Rhythm:regular Rate:Normal     Neuro/Psych negative neurological ROS  negative psych ROS   GI/Hepatic negative GI ROS, Neg liver ROS,   Endo/Other  negative endocrine ROSdiabetes  Renal/GU Renal disease  negative genitourinary   Musculoskeletal   Abdominal   Peds  Hematology negative hematology ROS (+)   Anesthesia Other Findings Past Medical History:   Hypertension                                                 Hyperlipidemia                                               Diabetes mellitus                                            Coronary artery disease                                        Comment:s/p CABG 1996   Sick sinus syndrome                                            Comment:s/p PPM 2001 (R sided),  RV lead fracture 10/12              with new system (MDT) placed on L side due to R              sided venous occlusion   Diabetic neuropathy                                          CVD (cardiovascular disease)                                   Comment:s/p CEA   Peripheral vascular disease                                  Chronic kidney disease  BPH (benign prostatic hyperplasia)                          Hypercholesteremia                                           Myocardial infarction                                        CHF (congestive heart failure)                               Ulcer of toe                                                 Ulcer of heel and midfoot                                    Reproductive/Obstetrics negative OB ROS                            Anesthesia Physical Anesthesia Plan  ASA: III  Anesthesia Plan: General LMA and MAC   Post-op Pain Management:    Induction:   Airway Management Planned:   Additional Equipment:   Intra-op Plan:   Post-operative Plan:   Informed Consent: I have reviewed the patients History and Physical, chart, labs and discussed the procedure including the risks, benefits and alternatives for the proposed anesthesia with the patient or authorized representative who has indicated his/her understanding and acceptance.     Plan Discussed with: Anesthesiologist, CRNA and Surgeon  Anesthesia Plan Comments: (Plan for trial of MAC with conversion to GA if required.  DNR suspended for procedure by son and patient.)        Anesthesia Quick Evaluation

## 2014-07-28 NOTE — Discharge Instructions (Signed)
Keep dressing clean and dry Home health visit tomorrow Make appt for 3 weeks with Dr Wyn Quakerew      AMBULATORY SURGERY  DISCHARGE INSTRUCTIONS   1) The drugs that you were given will stay in your system until tomorrow so for the next 24 hours you should not:  A) Drive an automobile B) Make any legal decisions C) Drink any alcoholic beverage   2) You may resume regular meals tomorrow.  Today it is better to start with liquids and gradually work up to solid foods.  You may eat anything you prefer, but it is better to start with liquids, then soup and crackers, and gradually work up to solid foods.   3) Please notify your doctor immediately if you have any unusual bleeding, trouble breathing, redness and pain at the surgery site, drainage, fever, or pain not relieved by medication.             Please contact your physician with any problems or Same Day Surgery at [phone], Monday through Friday 6 am to 4 pm, or [place] at [facility] Main number at [phone].

## 2014-07-28 NOTE — OR Nursing (Signed)
Na 137 K 5.0 Hct 35 Hb* 11.9

## 2014-07-28 NOTE — Transfer of Care (Signed)
Immediate Anesthesia Transfer of Care Note  Patient: [name]  Procedure(s) Performed: Procedure(s): DEBRIDEMENT WOUND/LEFT HEEL DEBRIDMENT (Left) APPLICATION OF WOUND VAC (Left)  Patient Location: PACU  Anesthesia Type:MAC  Level of Consciousness: awake and patient cooperative  Airway & Oxygen Therapy: Patient Spontanous Breathing  Post-op Assessment: Report given to RN and Post -op Vital signs reviewed and stable  Post vital signs: Reviewed and stable  Last Vitals:  Filed Vitals:   07/28/14 1645  BP: 122/62  Pulse: 74  Temp: 37.2 C  Resp: 14    Complications: No apparent anesthesia complications

## 2014-07-28 NOTE — Op Note (Signed)
    OPERATIVE NOTE   PROCEDURE: 1. Irrigation and debridement of left heel wound  PRE-OPERATIVE DIAGNOSIS: Nonviable tissue and longstanding ulceration of left heel  POST-OPERATIVE DIAGNOSIS: Same as above  SURGEON: Festus BarrenJason Dew, MD  ASSISTANT(S): Raul DelKim Stegmayer, PA-C  ANESTHESIA: IV sedation  ESTIMATED BLOOD LOSS: minimal  FINDING(S): None  SPECIMEN(S):  none  INDICATIONS:   [name] is a 81 y.o. male who presents with with a non-healing ulcer of the left heel.  He has already undergone revascularization of that left leg.  There is exposed bone present and we are bringing him in to debride this and remove all non-viable tissue..  DESCRIPTION: After obtaining full informed written consent, the patient was brought back to the operating room and placed supine upon the operating table.  The patient received IV antibiotics prior to induction.  After obtaining adequate anesthesia, the patient was prepped and draped in the standard fashion for left heel debridement .  The wound was then opened and excisional debridement was performed to the skin, soft tissue, and the exposed calcaneus  to remove all clearly non-viable tissue. The area debrided was about 10-15 cm.   much of the wound had good granulation tissue and this was not debrided. The tissue was taken back to bleeding tissue that appeared viable.  The debridement was performed with Mayo scissors and electrocautery  and encompassed an area of approximately 10-15  cm2.  The wound was irrigated copiously with pulse lavage saline.  After all clearly non-viable tissue was removed, a VAC sponge was cut to fit the wound. Strips of Ioban were used to obtain an occlusive seal and the sponge was connected to suction tubing. A seal was obtained.. The patient was then awakened from anesthesia and taken to the recovery room in stable condition having tolerated the procedure well.  COMPLICATIONS: none  CONDITION:  stable  DEW,JASON  07/28/2014, 4:31 PM

## 2014-07-28 NOTE — Progress Notes (Signed)
Wound vac to left heel intact and continuous pressure at 125

## 2014-07-28 NOTE — H&P (Signed)
[facility] History & Physical Update  The patient was interviewed and re-examined.  The patient's previous History and Physical has been reviewed and is unchanged.  There is no change in the plan of care. We plan to proceed with the scheduled procedure.  DEW,JASON, MD  07/28/2014, 2:39 PM

## 2014-07-29 ENCOUNTER — Encounter: Payer: Self-pay | Admitting: Vascular Surgery

## 2014-08-04 ENCOUNTER — Encounter: Payer: Medicare Other | Attending: Surgery | Admitting: Surgery

## 2014-08-04 DIAGNOSIS — E11621 Type 2 diabetes mellitus with foot ulcer: Secondary | ICD-10-CM | POA: Diagnosis not present

## 2014-08-04 DIAGNOSIS — E1152 Type 2 diabetes mellitus with diabetic peripheral angiopathy with gangrene: Secondary | ICD-10-CM | POA: Insufficient documentation

## 2014-08-04 DIAGNOSIS — M86372 Chronic multifocal osteomyelitis, left ankle and foot: Secondary | ICD-10-CM | POA: Insufficient documentation

## 2014-08-04 DIAGNOSIS — I70244 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: Secondary | ICD-10-CM | POA: Diagnosis not present

## 2014-08-04 DIAGNOSIS — L97323 Non-pressure chronic ulcer of left ankle with necrosis of muscle: Secondary | ICD-10-CM | POA: Insufficient documentation

## 2014-08-04 NOTE — Progress Notes (Addendum)
BERKLEY, CRONKRIGHT ([id number]) Visit Report for 08/04/2014 Chief Complaint Document Details Patient Name: [name], [name] Date of Service: 08/04/2014 11:45 AM Medical Record Number: [id number] Patient Account Number: [id number] Date of Birth/Sex: 1921/01/05 (81 y.o. Male) Treating RN: Primary Care Physician: Lindwood Qua Other Clinician: Referring Physician: Lindwood Qua Treating Physician/Extender: Rudene Re in Treatment: 14 Information Obtained from: Patient Chief Complaint Patient presents to the wound care center for a consult due non healing wound 81 year old gentleman who comes with a history of having some ulcerated areas on his heels since February 2016 and then a large injury to his left posterior heel and ankle since about a month. Electronic Signature(s) Signed: 08/04/2014 12:19:19 PM By: Evlyn Kanner MD, FACS Entered By: Evlyn Kanner on 08/04/2014 12:19:19 Gregori, [name] ([id number]) -------------------------------------------------------------------------------- Debridement Details Patient Name: [name], [name] Date of Service: 08/04/2014 11:45 AM Medical Record Number: [id number] Patient Account Number: [id number] Date of Birth/Sex: 29-Jul-1920 (81 y.o. Male) Treating RN: Primary Care Physician: Lindwood Qua Other Clinician: Referring Physician: Lindwood Qua Treating Physician/Extender: Rudene Re in Treatment: 14 Debridement Performed for Wound #1 Left Achilles Assessment: Performed By: Physician Tristan Schroeder., MD Debridement: Debridement Pre-procedure Yes Verification/Time Out Taken: Start Time: 12:09 Pain Control: Lidocaine 4% Topical Solution Level: Skin/Subcutaneous Tissue Total Area Debrided (L x 8.2 (cm) x 4.3 (cm) = 35.26 (cm) W): Tissue and other Viable, Non-Viable, Eschar, Fibrin/Slough, Subcutaneous material debrided: Instrument: Curette Bleeding: Minimum Hemostasis Achieved: Pressure End Time: 12:12 Procedural  Pain: 0 Post Procedural Pain: 0 Response to Treatment: Procedure was tolerated well Post Debridement Measurements of Total Wound Length: (cm) 8.2 Width: (cm) 4.3 Depth: (cm) 0.6 Volume: (cm) 16.616 Electronic Signature(s) Signed: 08/04/2014 12:18:13 PM By: Evlyn Kanner MD, FACS Entered By: Evlyn Kanner on 08/04/2014 12:18:12 Gaudin, [name] ([id number]) -------------------------------------------------------------------------------- Debridement Details Patient Name: [name], [name] Date of Service: 08/04/2014 11:45 AM Medical Record Number: [id number] Patient Account Number: [id number] Date of Birth/Sex: 07-08-1920 (81 y.o. Male) Treating RN: Primary Care Physician: Lindwood Qua Other Clinician: Referring Physician: Lindwood Qua Treating Physician/Extender: Rudene Re in Treatment: 14 Debridement Performed for Wound #2 Left,Lateral Calcaneous Assessment: Performed By: Physician Tristan Schroeder., MD Debridement: Debridement Pre-procedure Yes Verification/Time Out Taken: Start Time: 12:12 Pain Control: Lidocaine 4% Topical Solution Level: Skin/Subcutaneous Tissue Total Area Debrided (L x 0.3 (cm) x 2.9 (cm) = 0.87 (cm) W): Tissue and other Viable, Non-Viable, Fibrin/Slough, Subcutaneous material debrided: Instrument: Curette Bleeding: Minimum Hemostasis Achieved: Pressure End Time: 12:14 Procedural Pain: 0 Post Procedural Pain: 0 Response to Treatment: Procedure was tolerated well Post Debridement Measurements of Total Wound Length: (cm) 0.3 Width: (cm) 2.9 Depth: (cm) 0.2 Volume: (cm) 0.137 Electronic Signature(s) Signed: 08/04/2014 12:18:42 PM By: Evlyn Kanner MD, FACS Entered By: Evlyn Kanner on 08/04/2014 12:18:42 Kassel, Ibrahim [name] ([id number]) -------------------------------------------------------------------------------- Debridement Details Patient Name: [name], [name] Date of Service: 08/04/2014 11:45 AM Medical Record Number:  [id number] Patient Account Number: [id number] Date of Birth/Sex: Sep 25, 1920 (81 y.o. Male) Treating RN: Primary Care Physician: Lindwood Qua Other Clinician: Referring Physician: Lindwood Qua Treating Physician/Extender: Rudene Re in Treatment: 14 Debridement Performed for Wound #3 Left Toe Great Assessment: Performed By: Physician Tristan Schroeder., MD Debridement: Debridement Pre-procedure Yes Verification/Time Out Taken: Start Time: 12:06 Pain Control: Lidocaine 4% Topical Solution Level: Skin/Subcutaneous Tissue Total Area Debrided (L x 0.9 (cm) x 0.8 (cm) = 0.72 (cm) W): Tissue and other Viable, Non-Viable, Fibrin/Slough, Subcutaneous material debrided: Instrument: Curette Bleeding: Minimum Hemostasis Achieved: Pressure End Time: 12:09 Procedural Pain:  0 Post Procedural Pain: 0 Response to Treatment: Procedure was tolerated well Post Debridement Measurements of Total Wound Length: (cm) 0.9 Width: (cm) 0.8 Depth: (cm) 0.1 Volume: (cm) 0.057 Electronic Signature(s) Signed: 08/04/2014 12:19:07 PM By: Evlyn Kanner MD, FACS Entered By: Evlyn Kanner on 08/04/2014 12:19:07 Hensler, Roshad [name] ([id number]) -------------------------------------------------------------------------------- HPI Details Patient Name: [name], [name] Date of Service: 08/04/2014 11:45 AM Medical Record Number: [id number] Patient Account Number: [id number] Date of Birth/Sex: [birth year] (81 y.o. Male) Treating RN: Primary Care Physician: Lindwood Qua Other Clinician: Referring Physician: Lindwood Qua Treating Physician/Extender: Rudene Re in Treatment: 14 History of Present Illness HPI Description: 81 year old gentleman who was known to be a diabetic for many years has peripheral neuropathy recently went to his primary care doctor for a punctured wound on his left heel which was something he had noticed. The patient then was referred to a podiatrist who referred him  to Dr. Wyn Quaker in the vascular surgery department and I understand the procedure was done on 03/14/2014 with a left lower extremity vascular procedure and stenting was done. Details of this are not available at the present time. The patient has been applying Neosporin to his leg and has not had any wound care addressed so far. patient has also had a history of coronary artery disease in the past and hasn't had a CABG and a pacemaker placement in the remote past.Other details and notes are pending. the patient is not in pain lives alone and has some home health and other aides coming to help him with his daily chores and meals. reviewing the vascular notes I understand the procedure was done on 03/14/2014 and he was operated by Dr. Wyn Quaker. he had a catheter placement to the left peroneal artery and a aortogram and selective left lower extremity angiogram was done. He also had a percutaneous transluminal angioplasty of the left peroneal artery and the tibioperoneal trunk. The distal SFA and above-knee popliteal artery were also angioplastied. A subcutaneous stent placement to the distal superficial femoral artery was also done. 05/05/2014 -- the patient has not had any change in his health and after much consideration is decided that he does not want HBOT as he is claustrophobic and was unable to tolerate being in the chamber for 90 minutes. I have discussed with him that his most recent x-ray of the foot shows that he has the distal phalanx of the left great toe showing changes with osteomyelitis cannot be excluded at that site. He tells me that he cannot have an MRI because of his defibrillator and hence we will order a triple phase bone scan. I have also reviewed his culture report which shows several organisms and he has sensitivity to tetracycline and we have recommended he takes this for 14 days and this has been given to him on 05/02/2014 05/12/2014 the bone scan done on 05/10/2014 shows #1  findings are worrisome for osteomyelitis involving the calcaneus of the left foot, #2 increase uptake localizing to the left second and third toe on all 3 phases. Cannot rule out osteomyelitis in this area. And #3 left foot cellulitis. 05/19/2014 -- reviewed several reports which we have received back on [facility]. #1 chest x-ray done on April 21 shows chronic changes in the left base no acute findings. #2 his CBC is within normal limits. #3 hemoglobin A1c was 8.5%. #4 EKG done on 26 April was within normal limits due to his electronic ventricular pacemaker. 05/26/2014 -- he has had his PICC line placed and  is taking vancomycin daily basis. He is to start hyperbaric oxygen therapy on this coming Monday. 06/09/2014 -- he saw Dr. Sampson Goon yesterday and besides his IV antibiotic I believe a oral antibiotic was ERICSON, NAFZIGER. ([id number]) also given and this may be Cipro. Patient is feeling fine otherwise does not have any symptoms though his blood pressure this morning in the wound center has been 90/50. We will check his blood pressure in the supine position. 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 Complained of worsening fatigue yesterday after HBO. Feels better today. Has appointment with PCP this afternoon. He wishes to hold off on HBO until next week. No significant pain. No fever or chills. Stable drainage. 06/23/2014 Mattison has taken a break from HBO T and has been feeling a little better. He was seen by the nurse practitioner at his PCPs office and at that time his vitals were stable and they did get some EKG done which was within normal limits. There have sent out some lab work which we still have to receive reports. He is going to see his PCP back this afternoon and will be seeing Dr. Sampson Goon tomorrow for review. addendum: we have received labs from his PCPs office and note that his potassium is high at 5.4 and his BUN/creatinine is  slightly raised. His blood sugar was 216. His total protein and albumen were 5.9 and 3 and this was low. His HandH was 10.3 and 31.4 WBC was 8.2 and his platelets were 304. CRP was 29.6 which was high. Vancomycin trough was 19.1 which was normal TSH was 3.11 which was normal 06/30/2014 -- [name] [name] feels weak today and he feels his stomach is acting up and his not feeling up to doing hyperbaric oxygen therapy. He would like to take a break today and tomorrow and resume on Monday. He is on vancomycin and Levaquin as per ID and he has an appointment to see the vascular surgeons in about 10 days' time. 07/07/2014 -- [name] [name] has been feeling weak all along and he has decided that he is not going to continue with hyperbaric oxygen therapy and a longer period. He had finished 16 of his 40 treatments and will not take anymore. He has an appointment with ID tomorrow and also his vascular surgeons and I will have a conference with both of them regarding his further care. 07/14/2014 -- over the last week I have contacted Dr. Sampson Goon and Dr. Wyn Quaker and discussed his care. Dr. Wyn Quaker agrees that he will take him to the OR and debrided his wound and use a wound VAC. Dr. Sampson Goon has altered his anti-biotic regimen - vancomycin has been stopped and he is on oral levofloxacin. The patient's surgery is scheduled for July 7. 07/21/2014 -- he continues to feel unwell and has a lot of GI disturbances to probably his antibiotics. He also has developed a new blister on his left fourth toe and is concerned about his upcoming surgery which is scheduled for next Thursday. 08/04/2014 -- he was taken to the OR on 07/28/2014 and a debridement of the left heel was done of skin and soft tissue and the exposed calcaneum to remove all nonviable tissue. A wound VAC was then applied at the end of the procedure. Electronic Signature(s) Signed: 08/04/2014 12:19:32 PM By: Evlyn Kanner MD, FACS Previous Signature: 08/04/2014 11:31:41  AM Version By: Evlyn Kanner MD, FACS Previous Signature: 08/04/2014 11:31:22 AM Version By: Evlyn Kanner MD, FACS Previous Signature: 08/04/2014  10:35:32 AM Version By: Evlyn Kanner MD, FACS Entered By: Evlyn Kanner on 08/04/2014 12:19:32 KINSLER, SOEDER ([id number]) Logan Bores, [name] ([id number]) -------------------------------------------------------------------------------- Physical Exam Details Patient Name: [name], [name] Date of Service: 08/04/2014 11:45 AM Medical Record Number: [id number] Patient Account Number: [id number] Date of Birth/Sex: [birth year] (81 y.o. Male) Treating RN: Primary Care Physician: Lindwood Qua Other Clinician: Referring Physician: Lindwood Qua Treating Physician/Extender: Rudene Re in Treatment: 14 Constitutional . Pulse regular. Respirations normal and unlabored. Afebrile. . Eyes Nonicteric. Reactive to light. Ears, Nose, Mouth, and Throat Lips, teeth, and gums WNL.Marland Kitchen Moist mucosa without lesions . Neck supple and nontender. No palpable supraclavicular or cervical adenopathy. Normal sized without goiter. Respiratory WNL. No retractions.. Cardiovascular Pedal Pulses WNL. No clubbing, cyanosis or edema. Musculoskeletal Adexa without tenderness or enlargement.. Digits and nails w/o clubbing, cyanosis, infection, petechiae, ischemia, or inflammatory conditions.. Integumentary (Hair, Skin) No suspicious lesions. No crepitus or fluctuance. No peri-wound warmth or erythema. No masses.Marland Kitchen Psychiatric Judgement and insight Intact.. No evidence of depression, anxiety, or agitation.. Notes the left Achilles tendon region looks much cleaner and there is minimal bone exposed and the regulation tissue is healthy except for minimal slough which will be sharply debrided. Rest of the wounds on the left big toe and the left calcaneum need sharp debridement to remove slough. Electronic Signature(s) Signed: 08/04/2014 12:20:29 PM By: Evlyn Kanner MD,  FACS Entered By: Evlyn Kanner on 08/04/2014 12:20:28 Sarina Ill ([id number]) -------------------------------------------------------------------------------- Physician Orders Details Patient Name: [name], [name] Date of Service: 08/04/2014 11:45 AM Medical Record Number: [id number] Patient Account Number: [id number] Date of Birth/Sex: 17-Aug-1920 (81 y.o. Male) Treating RN: Curtis Sites Primary Care Physician: Lindwood Qua Other Clinician: Referring Physician: Lindwood Qua Treating Physician/Extender: Rudene Re in Treatment: 68 Verbal / Phone Orders: Yes Clinician: Curtis Sites Read Back and Verified: Yes Diagnosis Coding Wound Cleansing Wound #1 Left Achilles o Clean wound with Normal Saline. o May Shower, gently pat wound dry prior to applying new dressing. o May shower with protection. Wound #2 Left,Lateral Calcaneous o Clean wound with Normal Saline. o May Shower, gently pat wound dry prior to applying new dressing. o May shower with protection. Wound #3 Left Toe Great o Clean wound with Normal Saline. o May Shower, gently pat wound dry prior to applying new dressing. o May shower with protection. Skin Barriers/Peri-Wound Care Wound #1 Left Achilles o Skin Prep Wound #2 Left,Lateral Calcaneous o Skin Prep Wound #3 Left Toe Great o Skin Prep Primary Wound Dressing Wound #2 Left,Lateral Calcaneous o Prisma Ag Wound #3 Left Toe Great o Prisma Ag Secondary Dressing Wound #2 Left,Lateral Calcaneous o Gauze and Kerlix/Conform Dicarlo, Tamarion L. ([id number]) Wound #3 Left Toe Great o Gauze and Kerlix/Conform Dressing Change Frequency Wound #1 Left Achilles o Change Dressing Monday, Wednesday, Friday Wound #2 Left,Lateral Calcaneous o Change Dressing Monday, Wednesday, Friday Wound #3 Left Toe Great o Change Dressing Monday, Wednesday, Friday Follow-up Appointments Wound #1 Left Achilles o Return  Appointment in 1 week. Wound #2 Left,Lateral Calcaneous o Return Appointment in 1 week. Wound #3 Left Toe Great o Return Appointment in 1 week. Home Health Wound #1 Left Achilles o Continue Home Health Visits - [place] o Home Health Nurse may visit PRN to address patientos wound care needs. o FACE TO FACE ENCOUNTER: MEDICARE and MEDICAID PATIENTS: I certify that this patient is under my care and that I had a face-to-face encounter that meets the physician face-to-face encounter requirements with this patient on this date.  The encounter with the patient was in whole or in part for the following MEDICAL CONDITION: (primary reason for Home Healthcare) MEDICAL NECESSITY: I certify, that based on my findings, NURSING services are a medically necessary home health service. HOME BOUND STATUS: I certify that my clinical findings support that this patient is homebound (i.e., Due to illness or injury, pt requires aid of supportive devices such as crutches, cane, wheelchairs, walkers, the use of special transportation or the assistance of another person to leave their place of residence. There is a normal inability to leave the home and doing so requires considerable and taxing effort. Other absences are for medical reasons / religious services and are infrequent or of short duration when for other reasons). o If current dressing causes regression in wound condition, may D/C ordered dressing product/s and apply Normal Saline Moist Dressing daily until next Wound Healing Center / Other MD appointment. Notify Wound Healing Center of regression in wound condition at [phone]. o Please direct any NON-WOUND related issues/requests for orders to patient's Primary Care Physician Wound #2 Left,Lateral Calcaneous o [facility] [address] [name], Sie L. ([id number]) o Home Health Nurse may visit PRN to address patientos wound care needs. o FACE TO FACE ENCOUNTER:  MEDICARE and MEDICAID PATIENTS: I certify that this patient is under my care and that I had a face-to-face encounter that meets the physician face-to-face encounter requirements with this patient on this date. The encounter with the patient was in whole or in part for the following MEDICAL CONDITION: (primary reason for Home Healthcare) MEDICAL NECESSITY: I certify, that based on my findings, NURSING services are a medically necessary home health service. HOME BOUND STATUS: I certify that my clinical findings support that this patient is homebound (i.e., Due to illness or injury, pt requires aid of supportive devices such as crutches, cane, wheelchairs, walkers, the use of special transportation or the assistance of another person to leave their place of residence. There is a normal inability to leave the home and doing so requires considerable and taxing effort. Other absences are for medical reasons / religious services and are infrequent or of short duration when for other reasons). o If current dressing causes regression in wound condition, may D/C ordered dressing product/s and apply Normal Saline Moist Dressing daily until next Wound Healing Center / Other MD appointment. Notify Wound Healing Center of regression in wound condition at [phone]. o Please direct any NON-WOUND related issues/requests for orders to patient's Primary Care Physician Wound #3 Left Toe Great o Continue [place] Health Visits - [place] o Home Health Nurse may visit PRN to address patientos wound care needs. o FACE TO FACE ENCOUNTER: MEDICARE and MEDICAID PATIENTS: I certify that this patient is under my care and that I had a face-to-face encounter that meets the physician face-to-face encounter requirements with this patient on this date. The encounter with the patient was in whole or in part for the following MEDICAL CONDITION: (primary reason for Home Healthcare) MEDICAL NECESSITY: I certify, that  based on my findings, NURSING services are a medically necessary home health service. HOME BOUND STATUS: I certify that my clinical findings support that this patient is homebound (i.e., Due to illness or injury, pt requires aid of supportive devices such as crutches, cane, wheelchairs, walkers, the use of special transportation or the assistance of another person to leave their place of residence. There is a normal inability to leave the home and doing so requires considerable and taxing effort. Other  absences are for medical reasons / religious services and are infrequent or of short duration when for other reasons). o If current dressing causes regression in wound condition, may D/C ordered dressing product/s and apply Normal Saline Moist Dressing daily until next Wound Healing Center / Other MD appointment. Notify Wound Healing Center of regression in wound condition at [phone]. o Please direct any NON-WOUND related issues/requests for orders to patient's Primary Care Physician Negative Pressure Wound Therapy Wound #1 Left Achilles o Wound VAC settings at 125/130 mmHg continuous pressure. Use BLACK/GREEN foam to wound cavity. Use WHITE foam to fill any tunnel/s and/or undermining. Change VAC dressing 3 X WEEK. Change canister as indicated when full. Nurse may titrate settings and frequency of dressing changes as clinically indicated. o Home Health Nurse may d/c VAC for s/s of increased infection, significant wound regression, or uncontrolled drainage. Notify Wound Healing Center at [phone]. o Apply contact layer over base of wound. - window frame periwound skin with drape for protection o Number of foam/gauze pieces used in the dressing = AAIDYN, SAN ([phone]) Electronic Signature(s) Signed: 08/04/2014 5:01:54 PM By: Evlyn Kanner MD, FACS Signed: 08/04/2014 5:45:58 PM By: Curtis Sites Previous Signature: 08/04/2014 12:22:30 PM Version By: Evlyn Kanner MD,  FACS Entered By: Curtis Sites on 08/04/2014 12:37:15 [place], [place] [name] ([id number]) -------------------------------------------------------------------------------- Problem List Details Patient Name: [name], [name] Date of Service: 08/04/2014 11:45 AM Medical Record Number: [id number] Patient Account Number: [id number] Date of Birth/Sex: 11-05-1920 (81 y.o. 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 Date of Service: 08/04/2014 11:45 AM Medical Record Number: [id number] Patient Account Number: [id number] Date of Birth/Sex: 07-Jul-1920 (81 y.o. Male) Treating RN: Primary Care Physician: Lindwood Qua Other Clinician: Referring Physician: Lindwood Qua Treating Physician/Extender: Rudene Re in Treatment: 14 Subjective Chief Complaint Information obtained from Patient Patient presents to the wound care center for a consult due non healing wound 81 year old gentleman who comes with a history of having some ulcerated areas on  his heels since February 2016 and then a large injury to his left posterior heel and ankle since about a month. History of Present Illness (HPI) 81 year old gentleman who was known to be a diabetic for many years has peripheral neuropathy recently went to his primary care doctor for a punctured wound on his left heel which was something he had noticed. The patient then was referred to a podiatrist who referred him to Dr. Wyn Quaker in the vascular surgery department and I understand the procedure was done on 03/14/2014 with a left lower extremity vascular procedure and stenting was done. Details of this are not available at the present time. The patient has been applying Neosporin to his leg and has not had any wound care addressed so far. patient has also had a history of coronary artery disease  in the past and hasn't had a CABG and a pacemaker placement in the remote past.Other details and notes are pending. the patient is not in pain lives alone and has some home health and other aides coming to help him with his daily chores and meals. reviewing the vascular notes I understand the procedure was done on 03/14/2014 and he was operated by Dr. Wyn Quaker. he had a catheter placement to the left peroneal artery and a aortogram and selective left lower extremity angiogram was done. He also had a percutaneous transluminal angioplasty of the left peroneal artery and the tibioperoneal trunk. The distal SFA and above-knee popliteal artery were also angioplastied. A subcutaneous stent placement to the distal superficial femoral artery was also done. 05/05/2014 -- the patient has not had any change in his health and after much consideration is decided that he does not want HBOT as he is claustrophobic and was unable to tolerate being in the chamber for 90 minutes. I have discussed with him that his most recent x-ray of the foot shows that he has the distal phalanx of the left great toe showing changes with  osteomyelitis cannot be excluded at that site. He tells me that he cannot have an MRI because of his defibrillator and hence we will order a triple phase bone scan. I have also reviewed his culture report which shows several organisms and he has sensitivity to tetracycline and we have recommended he takes this for 14 days and this has been given to him on 05/02/2014 05/12/2014 the bone scan done on 05/10/2014 shows #1 findings are worrisome for osteomyelitis involving the calcaneus of the left foot, #2 increase uptake localizing to the left second and third toe on all 3 phases. Cannot rule out osteomyelitis in this area. And #3 left foot cellulitis. 05/19/2014 -- reviewed several reports which we have received back on [facility]. #1 chest x-ray done on April 21 shows chronic changes in the left base no acute findings. Dreibelbis, Rip L. ([id number]) #2 his CBC is within normal limits. #3 hemoglobin A1c was 8.5%. #4 EKG done on 26 April was within normal limits due to his electronic ventricular pacemaker. 05/26/2014 -- he has had his PICC line placed and is taking vancomycin daily basis. He is to start hyperbaric oxygen therapy on this coming Monday. 06/09/2014 -- he saw Dr. Sampson Goon yesterday and besides his IV antibiotic I believe a oral antibiotic was also given and this may be Cipro. Patient is feeling fine otherwise does not have any symptoms though his blood pressure this morning in the wound center has been 90/50. We will check his blood pressure in the supine position. 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 Complained of worsening fatigue yesterday after HBO. Feels better today. Has appointment with PCP this afternoon. He wishes to hold off on HBO until next week. No significant pain. No fever or chills. Stable drainage. 06/23/2014 Dallin has taken a break from HBO T and has been feeling a little better. He was seen by the nurse practitioner at  his PCPs office and at that time his vitals were stable and they did get some EKG done which was within normal limits. There have sent out some lab work which we still have to receive reports. He is going to see his PCP back this afternoon and will be seeing Dr. Sampson Goon tomorrow for review. addendum: we have received labs from his PCPs office and note that his potassium is high  at 5.4 and his BUN/creatinine is slightly raised. His blood sugar was 216. His total protein and albumen were 5.9 and 3 and this was low. His HandH was 10.3 and 31.4 WBC was 8.2 and his platelets were 304. CRP was 29.6 which was high. Vancomycin trough was 19.1 which was normal TSH was 3.11 which was normal 06/30/2014 -- [name] [name] feels weak today and he feels his stomach is acting up and his not feeling up to doing hyperbaric oxygen therapy. He would like to take a break today and tomorrow and resume on Monday. He is on vancomycin and Levaquin as per ID and he has an appointment to see the vascular surgeons in about 10 days' time. 07/07/2014 -- [name] [name] has been feeling weak all along and he has decided that he is not going to continue with hyperbaric oxygen therapy and a longer period. He had finished 16 of his 40 treatments and will not take anymore. He has an appointment with ID tomorrow and also his vascular surgeons and I will have a conference with both of them regarding his further care. 07/14/2014 -- over the last week I have contacted Dr. Sampson Goon and Dr. Wyn Quaker and discussed his care. Dr. Wyn Quaker agrees that he will take him to the OR and debrided his wound and use a wound VAC. Dr. Sampson Goon has altered his anti-biotic regimen - vancomycin has been stopped and he is on oral levofloxacin. The patient's surgery is scheduled for July 7. 07/21/2014 -- he continues to feel unwell and has a lot of GI disturbances to probably his antibiotics. He also has developed a new blister on his left fourth toe and is concerned about  his upcoming surgery which is scheduled for next Thursday. 08/04/2014 -- he was taken to the OR on 07/28/2014 and a debridement of the left heel was done of skin and soft tissue and the exposed calcaneum to remove all nonviable tissue. A wound VAC was then applied at the end of the procedure. Vohra, Ajani L. ([id number]) Objective Constitutional Pulse regular. Respirations normal and unlabored. Afebrile. Vitals Time Taken: 11:51 AM, Height: 70 in, Weight: 200 lbs, BMI: 28.7, Temperature: 97.4 F, Pulse: 82 bpm, Respiratory Rate: 18 breaths/min, Blood Pressure: 127/44 mmHg. Eyes Nonicteric. Reactive to light. Ears, Nose, Mouth, and Throat Lips, teeth, and gums WNL.Marland Kitchen Moist mucosa without lesions . Neck supple and nontender. No palpable supraclavicular or cervical adenopathy. Normal sized without goiter. Respiratory WNL. No retractions.. Cardiovascular Pedal Pulses WNL. No clubbing, cyanosis or edema. Musculoskeletal Adexa without tenderness or enlargement.. Digits and nails w/o clubbing, cyanosis, infection, petechiae, ischemia, or inflammatory conditions.Marland Kitchen Psychiatric Judgement and insight Intact.. No evidence of depression, anxiety, or agitation.. General Notes: the left Achilles tendon region looks much cleaner and there is minimal bone exposed and the regulation tissue is healthy except for minimal slough which will be sharply debrided. Rest of the wounds on the left big toe and the left calcaneum need sharp debridement to remove slough. Integumentary (Hair, Skin) No suspicious lesions. No crepitus or fluctuance. No peri-wound warmth or erythema. No masses.. Wound #1 status is Open. Original cause of wound was Gradually Appeared. The wound is located on the Left Achilles. The wound measures 8.2cm length x 4.3cm width x 0.6cm depth; 27.693cm^2 area and 16.616cm^3 volume. The wound is limited to skin breakdown. There is no tunneling or undermining noted. There is a large amount of  serosanguineous drainage noted. The wound margin is distinct with the outline attached to the wound base. There is  medium (34-66%) pink granulation within the wound bed. There is a Elizardo, Teddie L. ([id number]) small (1-33%) amount of necrotic tissue within the wound bed including Adherent Slough. The periwound skin appearance exhibited: Maceration, Moist. The periwound skin appearance did not exhibit: Callus, Crepitus, Excoriation, Fluctuance, Friable, Induration, Localized Edema, Rash, Scarring, Dry/Scaly, Atrophie Blanche, Cyanosis, Ecchymosis, Hemosiderin Staining, Mottled, Pallor, Rubor, Erythema. Wound #2 status is Open. Original cause of wound was Gradually Appeared. The wound is located on the Left,Lateral Calcaneous. The wound measures 0.3cm length x 2.9cm width x 0.2cm depth; 0.683cm^2 area and 0.137cm^3 volume. The wound is limited to skin breakdown. There is a none present amount of drainage noted. There is large (67-100%) pale granulation within the wound bed. There is no necrotic tissue within the wound bed. The periwound skin appearance exhibited: Dry/Scaly. The periwound skin appearance did not exhibit: Callus, Crepitus, Excoriation, Fluctuance, Friable, Induration, Localized Edema, Rash, Scarring, Maceration, Moist, Atrophie Blanche, Cyanosis, Ecchymosis, Hemosiderin Staining, Mottled, Pallor, Rubor, Erythema. Wound #3 status is Open. Original cause of wound was Gradually Appeared. The wound is located on the Left Toe Great. The wound measures 0.9cm length x 0.8cm width x 0.1cm depth; 0.565cm^2 area and 0.057cm^3 volume. The wound is limited to skin breakdown. There is no tunneling or undermining noted. The wound margin is flat and intact. There is small (1-33%) granulation within the wound bed. There is a large (67-100%) amount of necrotic tissue within the wound bed including Adherent Slough. The periwound skin appearance exhibited: Dry/Scaly, Moist. 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 The rest of the wounds will have Prisma applications. We will see him at weekly basis and the home health nurses will change the wound VAC twice a week. 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 There was a Skin/Subcutaneous Chenault, Tesla L. ([id number]) Tissue Debridement ([id number]) debridement with total area of 35.26 sq cm performed by Tristan Schroeder., MD. with the following instrument(s): Curette to remove Viable and Non-Viable tissue/material including Fibrin/Slough, Eschar, and Subcutaneous after achieving pain control using Lidocaine 4% Topical Solution. A time out was conducted prior to the start of the procedure. A Minimum amount of bleeding was controlled with Pressure. The procedure was tolerated well with a pain level of 0 throughout and a pain level of 0 following the procedure. Post Debridement Measurements: 8.2cm length x 4.3cm width x 0.6cm depth; 16.616cm^3 volume. Wound #2 Wound #2 is an Arterial Insufficiency Ulcer located on the Left,Lateral Calcaneous . There was a Skin/Subcutaneous Tissue Debridement ([id number]) debridement with total area of 0.87 sq cm performed by Tristan Schroeder., MD. with the following instrument(s): Curette to remove Viable and Non-Viable tissue/material including Fibrin/Slough and Subcutaneous after achieving pain control using Lidocaine 4% Topical Solution. A time out was conducted prior to the start of  the procedure. A Minimum amount of bleeding was controlled with Pressure. The procedure was tolerated well with a pain level of 0 throughout and a pain level of 0 following the procedure. Post Debridement Measurements: 0.3cm length x  2.9cm width x 0.2cm depth; 0.137cm^3 volume. Wound #3 Wound #3 is an Arterial Insufficiency Ulcer located on the Left Toe Great . There was a Skin/Subcutaneous Tissue Debridement ([id number]) debridement with total area of 0.72 sq cm performed by Tristan Schroeder., MD. with the following instrument(s): Curette to remove Viable and Non-Viable tissue/material including Fibrin/Slough and Subcutaneous after achieving pain control using Lidocaine 4% Topical Solution. A time out was conducted prior to the start of the procedure. A Minimum amount of bleeding was controlled with Pressure. The procedure was tolerated well with a pain level of 0 throughout and a pain level of 0 following the procedure. Post Debridement Measurements: 0.9cm length x 0.8cm width x 0.1cm depth; 0.057cm^3 volume. Plan Wound Cleansing: Wound #1 Left Achilles: Clean wound with Normal Saline. May Shower, gently pat wound dry prior to applying new dressing. May shower with protection. Wound #2 Left,Lateral Calcaneous: Clean wound with Normal Saline. May Shower, gently pat wound dry prior to applying new dressing. May shower with protection. Wound #3 Left Toe Great: Clean wound with Normal Saline. May Shower, gently pat wound dry prior to applying new dressing. May shower with protection. Skin Barriers/Peri-Wound Care: Wound #1 Left Achilles: Skin Prep Heenan, Johnluke L. ([id number]) Wound #2 Left,Lateral Calcaneous: Skin Prep Wound #3 Left Toe Great: Skin Prep Primary Wound Dressing: Wound #2 Left,Lateral Calcaneous: Prisma Ag Wound #3 Left Toe Great: Prisma Ag Secondary Dressing: Wound #2 Left,Lateral Calcaneous: Gauze and Kerlix/Conform Wound #3 Left Toe Great: Gauze and  Kerlix/Conform Dressing Change Frequency: Wound #1 Left Achilles: Change Dressing Monday, Wednesday, Friday Wound #2 Left,Lateral Calcaneous: Change Dressing Monday, Wednesday, Friday Wound #3 Left Toe Great: Change Dressing Monday, Wednesday, Friday Follow-up Appointments: Wound #1 Left Achilles: Return Appointment in 1 week. Wound #2 Left,Lateral Calcaneous: Return Appointment in 1 week. Wound #3 Left Toe Great: Return Appointment in 1 week. Home Health: Wound #1 Left Achilles: Continue Home Health Visits - [facility] Nurse may visit PRN to address patient s wound care needs. FACE TO FACE ENCOUNTER: MEDICARE and MEDICAID PATIENTS: I certify that this patient is under my care and that I had a face-to-face encounter that meets the physician face-to-face encounter requirements with this patient on this date. The encounter with the patient was in whole or in part for the following MEDICAL CONDITION: (primary reason for Home Healthcare) MEDICAL NECESSITY: I certify, that based on my findings, NURSING services are a medically necessary home health service. HOME BOUND STATUS: I certify that my clinical findings support that this patient is homebound (i.e., Due to illness or injury, pt requires aid of supportive devices such as crutches, cane, wheelchairs, walkers, the use of special transportation or the assistance of another person to leave their place of residence. There is a normal inability to leave the home and doing so requires considerable and taxing effort. Other absences are for medical reasons / religious services and are infrequent or of short duration when for other reasons). If current dressing causes regression in wound condition, may D/C ordered dressing product/s and apply Normal Saline Moist Dressing daily until next Wound Healing Center / Other MD appointment. Notify Wound Healing Center of regression in wound condition at [phone]. Please direct any  NON-WOUND related issues/requests for orders to patient's Primary Care Physician Wound #2 Left,Lateral Calcaneous: Continue Home Health Visits - [facility] Nurse may visit PRN to address patient s wound care needs. FACE TO FACE ENCOUNTER: MEDICARE and MEDICAID PATIENTS: I certify that this patient is under  my care and that I had a face-to-face encounter that meets the physician face-to-face encounter [name], [name] ([id number]) requirements with this patient on this date. The encounter with the patient was in whole or in part for the following MEDICAL CONDITION: (primary reason for Home Healthcare) MEDICAL NECESSITY: I certify, that based on my findings, NURSING services are a medically necessary home health service. HOME BOUND STATUS: I certify that my clinical findings support that this patient is homebound (i.e., Due to illness or injury, pt requires aid of supportive devices such as crutches, cane, wheelchairs, walkers, the use of special transportation or the assistance of another person to leave their place of residence. There is a normal inability to leave the home and doing so requires considerable and taxing effort. Other absences are for medical reasons / religious services and are infrequent or of short duration when for other reasons). If current dressing causes regression in wound condition, may D/C ordered dressing product/s and apply Normal Saline Moist Dressing daily until next Wound Healing Center / Other MD appointment. Notify Wound Healing Center of regression in wound condition at [phone]. Please direct any NON-WOUND related issues/requests for orders to patient's Primary Care Physician Wound #3 Left Toe Great: Continue Home Health Visits - [facility] Nurse may visit PRN to address patient s wound care needs. FACE TO FACE ENCOUNTER: MEDICARE and MEDICAID PATIENTS: I certify that this patient is under my care and that I had a face-to-face encounter  that meets the physician face-to-face encounter requirements with this patient on this date. The encounter with the patient was in whole or in part for the following MEDICAL CONDITION: (primary reason for Home Healthcare) MEDICAL NECESSITY: I certify, that based on my findings, NURSING services are a medically necessary home health service. HOME BOUND STATUS: I certify that my clinical findings support that this patient is homebound (i.e., Due to illness or injury, pt requires aid of supportive devices such as crutches, cane, wheelchairs, walkers, the use of special transportation or the assistance of another person to leave their place of residence. There is a normal inability to leave the home and doing so requires considerable and taxing effort. Other absences are for medical reasons / religious services and are infrequent or of short duration when for other reasons). If current dressing causes regression in wound condition, may D/C ordered dressing product/s and apply Normal Saline Moist Dressing daily until next Wound Healing Center / Other MD appointment. Notify Wound Healing Center of regression in wound condition at [phone]. Please direct any NON-WOUND related issues/requests for orders to patient's Primary Care Physician Negative Pressure Wound Therapy: Wound #1 Left Achilles: Wound VAC settings at 125/130 mmHg continuous pressure. Use BLACK/GREEN foam to wound cavity. Use WHITE foam to fill any tunnel/s and/or undermining. Change VAC dressing 3 X WEEK. Change canister as indicated when full. Nurse may titrate settings and frequency of dressing changes as clinically indicated. Home Health Nurse may d/c VAC for s/s of increased infection, significant wound regression, or uncontrolled drainage. Notify Wound Healing Center at [phone]. Apply contact layer over base of wound. - window frame periwound skin with drape for protection Number of foam/gauze pieces used in the dressing  = On the left calcaneum care is to be taken to protect the bone with a nonadherent dressing under the forearm. The rest of the wounds will have Prisma applications. We will see him at weekly basis and the home health nurses will change the wound VAC twice a week. Wakeman,  TORRION WITTER ([id number]) Electronic Signature(s) Signed: 08/04/2014 5:01:33 PM By: Evlyn Kanner MD, FACS Previous Signature: 08/04/2014 12:21:49 PM Version By: Evlyn Kanner MD, FACS Previous Signature: 08/04/2014 12:21:29 PM Version By: Evlyn Kanner MD, FACS Entered By: Evlyn Kanner on 08/04/2014 17:01:33 Borkowski, [name] ([id number]) -------------------------------------------------------------------------------- SuperBill Details Patient Name: [name], [name] L. Date of Service: 08/04/2014 Medical Record Number: [id number] Patient Account Number: [id number] Date of Birth/Sex: 08/28/1920 (81 y.o. 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 M86.372 Chronic multifocal osteomyelitis, left ankle and foot Facility Procedures CPT4: Description Modifier Quantity Code 08657846 11042 - DEB SUBQ TISSUE 20 SQ CM/< 1 ICD-10 Description Diagnosis M86.372 Chronic multifocal osteomyelitis, left ankle and foot I70.244 Atherosclerosis of native arteries of left leg with ulceration of  heel and midfoot 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 ([id number]) Electronic Signature(s) Signed: 08/04/2014 12:22:11 PM By: Evlyn Kanner MD, FACS Entered By: Evlyn Kanner on 08/04/2014 12:22:11

## 2014-08-05 NOTE — Progress Notes (Signed)
MCCABE, GLORIA ([id number]) Visit Report for 08/04/2014 Arrival Information Details Patient Name: NATHIN, SARAN. Date of Service: 08/04/2014 11:45 AM Medical Record Number: [id number] Patient Account Number: [id number] Date of Birth/Sex: 1920-11-10 (81 y.o. Male) Treating RN: Huel Coventry Primary Care Physician: Lindwood Qua Other Clinician: Referring Physician: Lindwood Qua Treating Physician/Extender: Rudene Re in Treatment: 14 Visit Information History Since Last Visit Added or deleted any medications: No Patient Arrived: Ambulatory Any new allergies or adverse reactions: No Arrival Time: 11:48 Had a fall or experienced change in No Accompanied By: son activities of daily living that may affect Transfer Assistance: Manual risk of falls: Patient Identification Verified: Yes Signs or symptoms of abuse/neglect since last No Secondary Verification Process Yes visito Completed: Hospitalized since last visit: No Patient Requires Transmission-Based No Has Dressing in Place as Prescribed: Yes Precautions: Pain Present Now: No Patient Has Alerts: No Electronic Signature(s) Signed: 08/04/2014 5:55:09 PM By: Elliot Gurney, RN, BSN, Kim RN, BSN Entered By: Elliot Gurney, RN, BSN, Kim on 08/04/2014 11:49:06 Lasch, [name] ([id number]) -------------------------------------------------------------------------------- Encounter Discharge Information Details Patient Name: Parkhurst, Sophie L. Date of Service: 08/04/2014 11:45 AM Medical Record Number: [id number] Patient Account Number: [id number] Date of Birth/Sex: 10-Dec-1920 (81 y.o. Male) Treating RN: Huel Coventry Primary Care Physician: Lindwood Qua Other Clinician: Referring Physician: Lindwood Qua Treating Physician/Extender: Rudene Re in Treatment: 14 Encounter Discharge Information Items Discharge Pain Level: 0 Discharge Condition: Stable Ambulatory Status: Wheelchair Discharge Destination:  Home Private Transportation: Auto Accompanied By: son Schedule Follow-up Appointment: Yes Medication Reconciliation completed and Yes provided to Patient/Care Sevanna Ballengee: Clinical Summary of Care: Electronic Signature(s) Signed: 08/04/2014 5:55:09 PM By: Elliot Gurney, RN, BSN, Kim RN, BSN Entered By: Elliot Gurney, RN, BSN, Kim on 08/04/2014 12:39:50 Errico, [name] ([id number]) -------------------------------------------------------------------------------- Lower Extremity Assessment Details Patient Name: Sear, Scotti L. Date of Service: 08/04/2014 11:45 AM Medical Record Number: [id number] Patient Account Number: [id number] Date of Birth/Sex: Oct 20, 1920 (81 y.o. Male) Treating RN: Huel Coventry Primary Care Physician: Lindwood Qua Other Clinician: Referring Physician: Lindwood Qua Treating Physician/Extender: Rudene Re in Treatment: 14 Vascular Assessment Pulses: Posterior Tibial Dorsalis Pedis Palpable: [Left:No] Doppler: [Left:Monophasic] Extremity colors, hair growth, and conditions: Hair Growth on Extremity: [Left:No] Temperature of Extremity: [Left:Warm] Capillary Refill: [Left:< 3 seconds] Toe Nail Assessment Left: Right: Thick: Yes Discolored: Yes Deformed: Yes Improper Length and Hygiene: No Electronic Signature(s) Signed: 08/04/2014 5:55:09 PM By: Elliot Gurney, RN, BSN, Kim RN, BSN Entered By: Elliot Gurney, RN, BSN, Kim on 08/04/2014 11:53:43 Vogelsang, [name] ([id number]) -------------------------------------------------------------------------------- Multi Wound Chart Details Patient Name: Shipp, Jarmaine L. Date of Service: 08/04/2014 11:45 AM Medical Record Number: [id number] Patient Account Number: [id number] Date of Birth/Sex: [birth year] (81 y.o. 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] Comorbid History: [1:Cataracts, Chronic sinus Cataracts, Chronic sinus Cataracts, Chronic sinus problems/congestion, Congestive Heart Failure, Congestive Heart Failure, Congestive Heart Failure, Coronary Artery Disease, Coronary Artery Disease, Coronary  Artery Disease, Hypertension, Myocardial Hypertension, Myocardial Hypertension, Myocardial Infarction, Peripheral Arterial Disease, Type II Arterial Disease, Type II Arterial Disease, Type II Diabetes, Neuropathy] [2:problems/congestion, Infarction,  Peripheral Diabetes, Neuropathy] [3:problems/congestion,  Infarction, Peripheral Diabetes, Neuropathy] Date Acquired: [1:01/24/2014] [2:01/24/2014] [3:01/24/2014] Weeks of Treatment: [1:14] [2:14] [3:14] Wound Status: [1:Open] [2:Open] [3:Open] Clustered Wound: [1:No] [2:Yes] [3:No] Pending Amputation on Yes [2:No] [3:No] Presentation: Measurements L x W x D 8.2x4.3x0.6 [2:0.3x2.9x0.2] [3:0.9x0.8x0.1] (cm) Area (cm) : [1:27.693] [2:0.683] [3:0.565] Volume (cm) : [1:16.616] [2:0.137] [3:0.057] % Reduction in Area: [1:-12.00%] [2:87.90%] [3:34.60%] % Reduction in Volume: -123.90% [2:75.80%] [3:33.70%] Classification: [1:Full Thickness With Exposed Support Structures] [2:Full Thickness Without Exposed Support Structures] [3:Full Thickness Without Exposed Support Structures] HBO Classification: [1:Grade 2] [2:Grade 2] [3:Grade 1] Exudate Amount: [1:Large] [2:None Present] [3:N/A] Exudate Type: [1:Serosanguineous] [2:N/A]  [3:N/A] Exudate Color: red, brown N/A N/A Wound Margin: Distinct, outline attached N/A Flat and Intact Granulation Amount: Medium (34-66%) Large (67-100%) Small (1-33%) Granulation Quality: Pink Pale N/A Necrotic Amount: Small (1-33%) None Present (0%) Large (67-100%) Exposed Structures: Fascia: No Fascia: No Fascia: No Fat: No Fat: No Fat: No Tendon: No Tendon: No Tendon: No Muscle: No Muscle: No Muscle: No Joint: No Joint: No Joint: No Bone: No Bone: No Bone: No Limited to Skin Limited to Skin Limited to Skin Breakdown Breakdown Breakdown Epithelialization: Small (1-33%) N/A Small (1-33%) Periwound Skin Texture: Edema: No Edema: No No Abnormalities Noted Excoriation: No Excoriation: No Induration: No Induration: No Callus: No Callus: No Crepitus: No Crepitus: No Fluctuance: No Fluctuance: No Friable: No Friable: No Rash: No Rash: No Scarring: No Scarring: No Periwound Skin Maceration: Yes Dry/Scaly: Yes Moist: Yes Moisture: Moist: Yes Maceration: No Dry/Scaly: Yes Dry/Scaly: No Moist: No Periwound Skin Color: Atrophie Blanche: No Atrophie Blanche: No No Abnormalities Noted Cyanosis: No Cyanosis: No Ecchymosis: No Ecchymosis: No Erythema: No Erythema: No Hemosiderin Staining: No Hemosiderin Staining: No Mottled: No Mottled: No Pallor: No Pallor: No Rubor: No Rubor: No Tenderness on No No No Palpation: Wound Preparation: Ulcer Cleansing: Ulcer Cleansing: Ulcer Cleansing: Rinsed/Irrigated with Rinsed/Irrigated with Rinsed/Irrigated with Saline Saline Saline Topical Anesthetic Topical Anesthetic Topical Anesthetic Applied: Other: lidocaine Applied: Other: lidociane Applied: Other: lidociane 4% 4% 4% Treatment Notes Electronic Signature(s) Signed: 08/04/2014 5:45:58 PM By: Curtis Sitesorthy, Joanna Entered By: Curtis Sitesorthy, Joanna on 08/04/2014 12:04:51 Tempesta, Raoul [name] ([id number]) Seelinger, Raoul [name]  ([id number]) -------------------------------------------------------------------------------- Multi-Disciplinary Care Plan Details Patient Name: Buys, Dai L. Date of Service: 08/04/2014 11:45 AM Medical Record Number: [id number] Patient Account Number: [id number] Date of Birth/Sex: 1920-12-01 41(81 y.o. Male) Treating RN: Curtis Sitesorthy, Joanna Primary Care Physician: [name], [name] Other Clinician: Referring Physician: [name], [name] Treating Physician/Extender: Rudene ReBritto, Errol Weeks in Treatment: 14 Active Inactive Abuse / Safety / Falls / Self Care Management Nursing Diagnoses: Potential for falls Goals: Patient will remain injury free Date Initiated: 04/28/2014 Goal Status: Active Interventions: Assess fall risk on admission and as needed Notes: Necrotic Tissue Nursing Diagnoses: Impaired tissue integrity related to necrotic/devitalized tissue Goals: Necrotic/devitalized tissue will be minimized in the wound bed Date Initiated: 04/28/2014 Goal Status: Active Interventions: Provide education on necrotic tissue and debridement process Treatment Activities: Apply topical anesthetic as ordered : 08/04/2014 Notes: Nutrition Nursing Diagnoses: Potential for alteratiion in Nutrition/Potential for imbalanced nutrition Swindler, Kelijah L. ([id number]) Goals: Patient/caregiver agrees to and verbalizes understanding of need to use nutritional supplements and/or vitamins as prescribed Date Initiated: 04/28/2014 Goal Status: Active Interventions: Assess patient nutrition upon admission and as needed per policy Notes: Orientation to the Wound Care Program Nursing Diagnoses: Knowledge deficit related to the wound healing center program Goals: Patient/caregiver will verbalize understanding of the Wound Healing Center Program Date Initiated: 04/28/2014 Goal Status: Active Interventions: Provide education on orientation to the  wound center Notes: Wound/Skin Impairment Nursing  Diagnoses: Impaired tissue integrity Goals: Ulcer/skin breakdown will have a volume reduction of 30% by week 4 Date Initiated: 04/28/2014 Goal Status: Active Interventions: Assess ulceration(s) every visit Notes: Electronic Signature(s) Signed: 08/04/2014 5:45:58 PM By: Curtis Sites Entered By: Curtis Sites on 08/04/2014 12:04:44 Beasley, Brailon Elbert Ewings ([id number]) -------------------------------------------------------------------------------- Pain Assessment Details Patient Name: Bosshart, Jahzier L. Date of Service: 08/04/2014 11:45 AM Medical Record Number: [id number] Patient Account Number: [id number] Date of Birth/Sex: [birth year] (81 y.o. Male) Treating RN: Huel Coventry Primary Care Physician: Lindwood Qua Other Clinician: Referring Physician: Lindwood Qua Treating Physician/Extender: Rudene Re in Treatment: 14 Active Problems Location of Pain Severity and Description of Pain Patient Has Paino No Site Locations Pain Management and Medication Current Pain Management: Electronic Signature(s) Signed: 08/04/2014 5:55:09 PM By: Elliot Gurney, RN, BSN, Kim RN, BSN Entered By: Elliot Gurney, RN, BSN, Kim on 08/04/2014 11:49:14 Leiva, [name] ([id number]) -------------------------------------------------------------------------------- Patient/Caregiver Education Details Patient Name: Pote, Graycen L. Date of Service: 08/04/2014 11:45 AM Medical Record Number: [id number] Patient Account Number: [id number] Date of Birth/Gender: Sep 26, 1920 (81 y.o. Male) Treating RN: Huel Coventry Primary Care Physician: Lindwood Qua Other Clinician: Referring Physician: Lindwood Qua Treating Physician/Extender: Rudene Re in Treatment: 14 Education Assessment Education Provided To: Patient Education Topics Provided Wound Debridement: Handouts: Wound Debridement Methods: Demonstration Responses: State content correctly Wound/Skin Impairment: Handouts: Other: NPWT to be changed by [facility] Mon  and Wed by Wound Center on Friday Electronic Signature(s) Signed: 08/04/2014 5:55:09 PM By: Elliot Gurney, RN, BSN, Kim RN, BSN Entered By: Elliot Gurney, RN, BSN, Kim on 08/04/2014 12:40:41 Sloane, [name] ([id number]) -------------------------------------------------------------------------------- Wound Assessment Details Patient Name: Lingenfelter, Rahiem L. Date of Service: 08/04/2014 11:45 AM Medical Record Number: [id number] Patient Account Number: [id number] Date of Birth/Sex: 09/28/1920 (81 y.o. Male) Treating RN: Huel Coventry Primary Care Physician: Lindwood Qua Other Clinician: Referring Physician: Lindwood Qua Treating Physician/Extender: Rudene Re in Treatment: 14 Wound Status Wound Number: 1 Primary Arterial Insufficiency Ulcer Etiology: Wound Location: Left Achilles Wound Open Wounding Event: Gradually Appeared Status: Date Acquired: 01/24/2014 Comorbid Cataracts, Chronic sinus Weeks Of Treatment: 14 History: problems/congestion, Congestive Heart Clustered Wound: No Failure, Coronary Artery Disease, Pending Amputation On Presentation Hypertension, Myocardial Infarction, Peripheral Arterial Disease, Type II Diabetes, Neuropathy Photos Photo Uploaded By: Elliot Gurney, RN, BSN, Kim on 08/04/2014 17:43:14 Wound Measurements Length: (cm) 8.2 Width: (cm) 4.3 Depth: (cm) 0.6 Area: (cm) 27.693 Volume: (cm) 16.616 % Reduction in Area: -12% % Reduction in Volume: -123.9% Epithelialization: Small (1-33%) Tunneling: No Undermining: No Wound Description Full Thickness With Exposed Foul Odor Af Classification: Support Structures Diabetic Severity Grade 2 (Wagner): Wound Margin: Distinct, outline attached Exudate Amount: Large Exudate Type: Serosanguineous Exudate Color: red, brown Savich, Damein L. ([id number]) ter Cleansing: No Wound Bed Granulation Amount: Medium (34-66%) Exposed Structure Granulation Quality: Pink Fascia Exposed: No Necrotic Amount: Small (1-33%) Fat  Layer Exposed: No Necrotic Quality: Adherent Slough Tendon Exposed: No Muscle Exposed: No Joint Exposed: No Bone Exposed: No Limited to Skin Breakdown Periwound Skin Texture Texture Color No Abnormalities Noted: No No Abnormalities Noted: No Callus: No Atrophie Blanche: No Crepitus: No Cyanosis: No Excoriation: No Ecchymosis: No Fluctuance: No Erythema: No Friable: No Hemosiderin Staining: No Induration: No Mottled: No Localized Edema: No Pallor: No Rash: No Rubor: No Scarring: No Moisture No Abnormalities Noted: No Dry / Scaly: No Maceration: Yes Moist: Yes Wound Preparation Ulcer Cleansing: Rinsed/Irrigated with Saline Topical Anesthetic Applied: Other: lidocaine 4%, Treatment Notes Wound #1 (Left Achilles) 8. Negative Pressure Wound Therapy  Wound Vac to wound continuously at 142mm/hg pressure Black Foam Number of foam/gauze pieces used in the dressing = Change canister as needed. Other (specify in notes) Notes Contact layer over bone. 1 piece of foam Electronic Signature(s) Signed: 08/04/2014 5:55:09 PM By: Elliot Gurney, RN, BSN, Kim RN, BSN [address] Jermal Elbert Ewings ([id number]) Entered By: Elliot Gurney, RN, BSN, Kim on 08/04/2014 11:59:09 Limb, [name] ([id number]) -------------------------------------------------------------------------------- Wound Assessment Details Patient Name: Penagos, Achillies L. Date of Service: 08/04/2014 11:45 AM Medical Record Number: [id number] Patient Account Number: [id number] Date of Birth/Sex: October 20, 1920 (81 y.o. Male) Treating RN: Huel Coventry Primary Care Physician: Lindwood Qua Other Clinician: Referring Physician: Lindwood Qua Treating Physician/Extender: Rudene Re in Treatment: 14 Wound Status Wound Number: 2 Primary Arterial Insufficiency Ulcer Etiology: Wound Location: Left Calcaneous - Lateral Wound Open Wounding Event: Gradually Appeared Status: Date Acquired: 01/24/2014 Comorbid Cataracts, Chronic sinus Weeks Of  Treatment: 14 History: problems/congestion, Congestive Heart Clustered Wound: Yes Failure, Coronary Artery Disease, Hypertension, Myocardial Infarction, Peripheral Arterial Disease, Type II Diabetes, Neuropathy Photos Photo Uploaded By: Elliot Gurney, RN, BSN, Kim on 08/04/2014 17:43:43 Wound Measurements Length: (cm) 0.3 Width: (cm) 2.9 Depth: (cm) 0.2 Area: (cm) 0.683 Volume: (cm) 0.137 % Reduction in Area: 87.9% % Reduction in Volume: 75.8% Wound Description Full Thickness Without Exposed Classification: Support Structures Diabetic Severity Grade 2 (Wagner): Exudate Amount: None Present Wound Bed Granulation Amount: Large (67-100%) Exposed Structure Shoberg, Arkin L. ([id number]) Granulation Quality: Pale Fascia Exposed: No Necrotic Amount: None Present (0%) Fat Layer Exposed: No Tendon Exposed: No Muscle Exposed: No Joint Exposed: No Bone Exposed: No Limited to Skin Breakdown Periwound Skin Texture Texture Color No Abnormalities Noted: No No Abnormalities Noted: No Callus: No Atrophie Blanche: No Crepitus: No Cyanosis: No Excoriation: No Ecchymosis: No Fluctuance: No Erythema: No Friable: No Hemosiderin Staining: No Induration: No Mottled: No Localized Edema: No Pallor: No Rash: No Rubor: No Scarring: No Moisture No Abnormalities Noted: No Dry / Scaly: Yes Maceration: No Moist: No Wound Preparation Ulcer Cleansing: Rinsed/Irrigated with Saline Topical Anesthetic Applied: Other: lidociane 4%, Treatment Notes Wound #2 (Left, Lateral Calcaneous) 1. Cleansed with: Clean wound with Normal Saline 2. Anesthetic Topical Lidocaine 4% cream to wound bed prior to debridement 4. Dressing Applied: Prisma Ag 5. Secondary Dressing Applied Gauze and Kerlix/Conform 7. Secured with Secretary/administrator) Signed: 08/04/2014 5:55:09 PM By: Elliot Gurney, RN, BSN, Kim RN, BSN Entered By: Elliot Gurney, RN, BSN, Kim on 08/04/2014 11:57:51 Snare, [name]  ([id number]) Thew, Pranav Elbert Ewings ([id number]) -------------------------------------------------------------------------------- Wound Assessment Details Patient Name: Fawaz, Herschell L. Date of Service: 08/04/2014 11:45 AM Medical Record Number: [id number] Patient Account Number: [id number] Date of Birth/Sex: 08/17/1920 (81 y.o. Male) Treating RN: Huel Coventry Primary Care Physician: Lindwood Qua Other Clinician: Referring Physician: Lindwood Qua Treating Physician/Extender: Rudene Re in Treatment: 14 Wound Status Wound Number: 3 Primary Arterial Insufficiency Ulcer Etiology: Wound Location: Left Toe Great Wound Open Wounding Event: Gradually Appeared Status: Date Acquired: 01/24/2014 Comorbid Cataracts, Chronic sinus Weeks Of Treatment: 14 History: problems/congestion, Congestive Heart Clustered Wound: No Failure, Coronary Artery Disease, Hypertension, Myocardial Infarction, Peripheral Arterial Disease, Type II Diabetes, Neuropathy Photos Photo Uploaded By: Elliot Gurney, RN, BSN, Kim on 08/04/2014 17:43:44 Wound Measurements Length: (cm) 0.9 Width: (cm) 0.8 Depth: (cm) 0.1 Area: (cm) 0.565 Volume: (cm) 0.057 % Reduction in Area: 34.6% % Reduction in Volume: 33.7% Epithelialization: Small (1-33%) Tunneling: No Undermining: No Wound Description Full Thickness Without Exposed Classification: Support Structures Diabetic Severity Grade 1 (Wagner): Wound Margin: Flat and Intact Wound Bed Granulation Amount: Small (  1-33%) Exposed Structure Molesky, Haley L. ([id number]) Necrotic Amount: Large (67-100%) Fascia Exposed: No Necrotic Quality: Adherent Slough Fat Layer Exposed: No Tendon Exposed: No Muscle Exposed: No Joint Exposed: No Bone Exposed: No Limited to Skin Breakdown Periwound Skin Texture Texture Color No Abnormalities Noted: No No Abnormalities Noted: No Moisture No Abnormalities Noted: No Dry / Scaly: Yes Moist: Yes Wound Preparation Ulcer  Cleansing: Rinsed/Irrigated with Saline Topical Anesthetic Applied: Other: lidociane 4%, Treatment Notes Wound #3 (Left Toe Great) 1. Cleansed with: Clean wound with Normal Saline 2. Anesthetic Topical Lidocaine 4% cream to wound bed prior to debridement 4. Dressing Applied: Prisma Ag 5. Secondary Dressing Applied Gauze and Kerlix/Conform 7. Secured with Secretary/administrator) Signed: 08/04/2014 5:55:09 PM By: Elliot Gurney, RN, BSN, Kim RN, BSN Entered By: Elliot Gurney, RN, BSN, Kim on 08/04/2014 12:00:18 [place], Pallas [name] ([id number]) -------------------------------------------------------------------------------- Vitals Details Patient Name: Loughmiller, Othel L. Date of Service: 08/04/2014 11:45 AM Medical Record Number: [id number] Patient Account Number: [id number] Date of Birth/Sex: Mar 26, 1920 (81 y.o. Male) Treating RN: Huel Coventry Primary Care Physician: Lindwood Qua Other Clinician: Referring Physician: Lindwood Qua Treating Physician/Extender: Rudene Re in Treatment: 14 Vital Signs Time Taken: 11:51 Temperature (F): 97.4 Height (in): 70 Pulse (bpm): 82 Weight (lbs): 200 Respiratory Rate (breaths/min): 18 Body Mass Index (BMI): 28.7 Blood Pressure (mmHg): 127/44 Reference Range: 80 - 120 mg / dl Electronic Signature(s) Signed: 08/04/2014 5:55:09 PM By: Elliot Gurney, RN, BSN, Kim RN, BSN Entered By: Elliot Gurney, RN, BSN, Kim on 08/04/2014 11:51:51

## 2014-08-08 NOTE — Anesthesia Postprocedure Evaluation (Signed)
  Anesthesia Post-op Note  Patient: [name]  Procedure(s) Performed: Procedure(s): DEBRIDEMENT WOUND/LEFT HEEL DEBRIDMENT (Left) APPLICATION OF WOUND VAC (Left)  Anesthesia type:MAC  Patient location: PACU  Post pain: Pain level controlled  Post assessment: Post-op Vital signs reviewed, Patient's Cardiovascular Status Stable, Respiratory Function Stable, Patent Airway and No signs of Nausea or vomiting  Post vital signs: Reviewed and stable  Last Vitals:  Filed Vitals:   07/28/14 1740  BP: 130/55  Pulse:   Temp:   Resp: 18    Level of consciousness: awake, alert  and patient cooperative  Complications: No apparent anesthesia complications

## 2014-08-12 ENCOUNTER — Encounter: Payer: Medicare Other | Admitting: Surgery

## 2014-08-12 DIAGNOSIS — L97323 Non-pressure chronic ulcer of left ankle with necrosis of muscle: Secondary | ICD-10-CM | POA: Diagnosis not present

## 2014-08-13 NOTE — Progress Notes (Signed)
[name] ([id number]) Visit Report for 08/12/2014 Arrival Information Details Patient Name: [name], [name]. Date of Service: 08/12/2014 11:45 AM Medical Record Number: [id number] Patient Account Number: [id number] Date of Birth/Sex: 07-08-1920 (81 y.o. Male) Treating RN: Huel Coventry Primary Care Physician: Lindwood Qua Other Clinician: Referring Physician: Lindwood Qua Treating Physician/Extender: Rudene Re in Treatment: 15 Visit Information History Since Last Visit Added or deleted any medications: No Patient Arrived: Wheel Chair Any new allergies or adverse reactions: No Arrival Time: 11:56 Had a fall or experienced change in No activities of daily living that may affect Accompanied By: son risk of falls: Transfer Assistance: Manual Signs or symptoms of abuse/neglect since last No Patient Identification Verified: Yes visito Secondary Verification Process Yes Hospitalized since last visit: No Completed: Has Dressing in Place as Prescribed: No Patient Requires Transmission-Based No Pain Present Now: No Precautions: Patient Has Alerts: No Electronic Signature(s) Signed: 08/12/2014 5:03:22 PM By: Elliot Gurney, RN, BSN, Kim RN, BSN Entered By: Elliot Gurney, RN, BSN, Kim on 08/12/2014 11:56:47 Bremer, [name] ([id number]) -------------------------------------------------------------------------------- Encounter Discharge Information Details Patient Name: [name], [name] Date of Service: 08/12/2014 11:45 AM Medical Record Number: [id number] Patient Account Number: [id number] Date of Birth/Sex: 1920/09/11 (81 y.o. Male) Treating RN: Huel Coventry Primary Care Physician: Lindwood Qua Other Clinician: Referring Physician: Lindwood Qua Treating Physician/Extender: Rudene Re in Treatment: 15 Encounter Discharge Information Items Discharge Pain Level: 0 Discharge Condition: Stable Ambulatory Status: Ambulatory Discharge Destination:  Home Private Transportation: Auto Accompanied By: son Schedule Follow-up Appointment: Yes Medication Reconciliation completed and Yes provided to Patient/Care Ridhima Golberg: Clinical Summary of Care: Electronic Signature(s) Signed: 08/12/2014 5:03:22 PM By: Elliot Gurney, RN, BSN, Kim RN, BSN Entered By: Elliot Gurney, RN, BSN, Kim on 08/12/2014 12:51:20 Bellard, [name] -------------------------------------------------------------------------------- Lower Extremity Assessment Details Patient Name: [name], [name] Date of Service: 08/12/2014 11:45 AM Medical Record Number: [id number] Patient Account Number: [id number] Date of Birth/Sex: [birth year] (81 y.o. Male) Treating RN: Huel Coventry Primary Care Physician: Lindwood Qua Other Clinician: Referring Physician: Lindwood Qua Treating Physician/Extender: Rudene Re in Treatment: 15 Vascular Assessment Pulses: Posterior Tibial Dorsalis Pedis Palpable: [Left:No] Doppler: [Left:Monophasic] Extremity colors, hair growth, and conditions: Extremity Color: [Left:Red] Hair Growth on Extremity: [Left:No] Temperature of Extremity: [Left:Warm] Capillary Refill: [Left:< 3 seconds] Toe Nail Assessment Left: Right: Thick: Yes Discolored: Yes Deformed: Yes Improper Length and Hygiene: Yes Electronic Signature(s) Signed: 08/12/2014 5:03:22 PM By: Elliot Gurney, RN, BSN, Kim RN, BSN Entered By: Elliot Gurney, RN, BSN, Kim on 08/12/2014 12:04:46 Fick, [name] ([id number]) -------------------------------------------------------------------------------- Multi Wound Chart Details Patient Name: [name], [name] Date of Service: 08/12/2014 11:45 AM Medical Record Number: [id number] Patient Account Number: [id number] Date of Birth/Sex: February 27, 1920 (81 y.o. 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] Comorbid History: [1:Cataracts, Chronic sinus Cataracts, Chronic sinus Cataracts, Chronic sinus problems/congestion, Congestive Heart Failure, Congestive Heart Failure, Congestive Heart Failure, Coronary Artery Disease, Coronary Artery Disease, Coronary  Artery Disease, Hypertension, Myocardial Hypertension, Myocardial Hypertension, Myocardial Infarction, Peripheral Arterial Disease, Type II Arterial Disease, Type II Arterial Disease, Type II Diabetes, Neuropathy] [2:problems/congestion, Infarction,  Peripheral Diabetes, Neuropathy] [3:problems/congestion, Infarction, Peripheral Diabetes, Neuropathy] Date Acquired: [1:01/24/2014] [2:01/24/2014] [3:01/24/2014] Weeks of Treatment: [1:15] [2:15] [3:15] Wound Status: [1:Open] [2:Open] [3:Open] Clustered Wound: [1:No] [2:Yes] [3:No] Pending Amputation on Yes [2:No] [3:No] Presentation: Measurements L x W x D 8x4.2x0.3 [2:0.3x2.9x0.2] [3:0.7x0.8x0.2] (cm) Area (cm) : [1:26.389] [2:0.683] [3:0.44] Volume (cm) : [1:7.917] [2:0.137] [3:0.088] % Reduction in Area: [1:-6.70%] [2:87.90%] [3:49.10%] % Reduction in Volume: -6.70% [2:75.80%] [3:-2.30%] Position 1 (o'clock): 12 Maximum Distance 1 1.5 (cm): Tunneling: [1:Yes] [2:N/A] [3:N/A] Classification: Harte, Lenus L. ([id number]) Full Thickness With Full Thickness Without Full Thickness Without Exposed Support Exposed Support Exposed Support Structures Structures Structures HBO Classification: Grade 2 Grade 2  Grade 1 Exudate Amount: Large None Present N/A Exudate Type: Serosanguineous N/A N/A Exudate Color: red, brown N/A N/A Wound Margin: Distinct, outline attached N/A Flat and Intact Granulation Amount: Medium (34-66%) Large (67-100%) Small (1-33%) Granulation Quality: Pink Pale Pale Necrotic Amount: Small (1-33%) None Present (0%) Large (67-100%) Exposed Structures: Fascia: No Fascia: No Fascia: No Fat: No Fat: No Fat: No Tendon: No Tendon: No Tendon: No Muscle: No Muscle: No Muscle: No Joint: No Joint: No Joint: No Bone: No Bone: No Bone: No Limited to Skin Limited to Skin Limited to Skin Breakdown Breakdown Breakdown Epithelialization: Small (1-33%) N/A Small (1-33%) Periwound Skin Texture: Edema: No Edema: No No Abnormalities Noted Excoriation: No Excoriation: No Induration: No Induration: No Callus: No Callus: No Crepitus: No Crepitus: No Fluctuance: No Fluctuance: No Friable: No Friable: No Rash: No Rash: No Scarring: No Scarring: No Periwound Skin Maceration: Yes Maceration: Yes Moist: Yes Moisture: Moist: Yes Moist: No Dry/Scaly: Yes Dry/Scaly: No Dry/Scaly: No Periwound Skin Color: Atrophie Blanche: No Atrophie Blanche: No No Abnormalities Noted Cyanosis: No Cyanosis: No Ecchymosis: No Ecchymosis: No Erythema: No Erythema: No Hemosiderin Staining: No Hemosiderin Staining: No Mottled: No Mottled: No Pallor: No Pallor: No Rubor: No Rubor: No Tenderness on No No No Palpation: Wound Preparation: Ulcer Cleansing: Ulcer Cleansing: Ulcer Cleansing: Rinsed/Irrigated with Rinsed/Irrigated with Rinsed/Irrigated with Saline Saline Saline Topical Anesthetic Topical Anesthetic Topical Anesthetic Applied: Other: lidocaine Applied: Other: lidociane Applied: Other: lidociane 4% 4% 4% IZEL, EISENHARDT ([id number]) Treatment Notes Electronic Signature(s) Signed: 08/12/2014 5:03:22 PM By: Elliot Gurney, RN, BSN, Kim RN, BSN Entered By: Elliot Gurney, RN, BSN, Kim  on 08/12/2014 12:14:42 Secrest, [name] ([id number]) -------------------------------------------------------------------------------- Multi-Disciplinary Care Plan Details Patient Name: Felicetti, Lucien L. Date of Service: 08/12/2014 11:45 AM Medical Record Number: [id number] Patient Account Number: [id number] Date of Birth/Sex: 06/29/1920 (81 y.o. Male) Treating RN: Huel Coventry Primary Care Physician: Lindwood Qua Other Clinician: Referring Physician: Lindwood Qua Treating Physician/Extender: Rudene Re in Treatment: 15 Active Inactive Abuse / Safety / Falls / Self Care Management Nursing Diagnoses: Potential for falls Goals: Patient will remain injury free Date Initiated: 04/28/2014 Goal Status: Active Interventions: Assess fall risk on admission and as needed Notes: Necrotic Tissue Nursing Diagnoses: Impaired tissue integrity related to necrotic/devitalized tissue Goals: Necrotic/devitalized tissue will be minimized in the wound bed Date Initiated: 04/28/2014 Goal Status: Active Interventions: Provide education on necrotic tissue and debridement process Treatment Activities: Apply topical anesthetic as ordered : 08/12/2014 Notes: Nutrition Nursing Diagnoses: Potential for alteratiion in Nutrition/Potential for imbalanced nutrition Cope, Neyland L. ([id number]) Goals: Patient/caregiver agrees to and verbalizes understanding of need to use nutritional supplements and/or vitamins as prescribed Date Initiated: 04/28/2014 Goal Status: Active Interventions: Assess patient nutrition upon admission and as needed per policy Notes: Orientation to the Wound Care Program Nursing Diagnoses: Knowledge deficit related to the wound  healing center program Goals: Patient/caregiver will verbalize understanding of the Wound Healing Center Program Date Initiated: 04/28/2014 Goal Status: Active Interventions: Provide education on orientation to the wound center Notes: Wound/Skin  Impairment Nursing Diagnoses: Impaired tissue integrity Goals: Ulcer/skin breakdown will have a volume reduction of 30% by week 4 Date Initiated: 04/28/2014 Goal Status: Active Interventions: Assess ulceration(s) every visit Notes: Electronic Signature(s) Signed: 08/12/2014 5:03:22 PM By: Elliot Gurney, RN, BSN, Kim RN, BSN Entered By: Elliot Gurney, RN, BSN, Kim on 08/12/2014 12:14:36 Razon, [name] ([id number]) -------------------------------------------------------------------------------- Pain Assessment Details Patient Name: Hoppel, Kallum L. Date of Service: 08/12/2014 11:45 AM Medical Record Number: [id number] Patient Account Number: [id number] Date of Birth/Sex: [birth year] (81 y.o. Male) Treating RN: Huel Coventry Primary Care Physician: Lindwood Qua Other Clinician: Referring Physician: Lindwood Qua Treating Physician/Extender: Rudene Re in Treatment: 15 Active Problems Location of Pain Severity and Description of Pain Patient Has Paino No Site Locations Pain Management and Medication Current Pain Management: Electronic Signature(s) Signed: 08/12/2014 5:03:22 PM By: Elliot Gurney, RN, BSN, Kim RN, BSN Entered By: Elliot Gurney, RN, BSN, Kim on 08/12/2014 11:56:55 Mckone, [name] ([id number]) -------------------------------------------------------------------------------- Patient/Caregiver Education Details Patient Name: Breitenstein, Taji L. Date of Service: 08/12/2014 11:45 AM Medical Record Number: [id number] Patient Account Number: [id number] Date of Birth/Gender: July 02, 1920 (81 y.o. Male) Treating RN: Huel Coventry Primary Care Physician: Lindwood Qua Other Clinician: Referring Physician: Lindwood Qua Treating Physician/Extender: Rudene Re in Treatment: 15 Education Assessment Education Provided To: Patient Education Topics Provided Wound/Skin Impairment: Handouts: Caring for Your Ulcer, Other: continue wound care as orderered Psychologist, prison and probation services) Signed: 08/12/2014  5:03:22 PM By: Elliot Gurney, RN, BSN, Kim RN, BSN Entered By: Elliot Gurney, RN, BSN, Kim on 08/12/2014 12:51:46 Tates, [name] ([id number]) -------------------------------------------------------------------------------- Wound Assessment Details Patient Name: Hoben, Siddarth L. Date of Service: 08/12/2014 11:45 AM Medical Record Number: [id number] Patient Account Number: [id number] Date of Birth/Sex: 07-02-1920 (81 y.o. Male) Treating RN: Huel Coventry Primary Care Physician: Lindwood Qua Other Clinician: Referring Physician: Lindwood Qua Treating Physician/Extender: Rudene Re in Treatment: 15 Wound Status Wound Number: 1 Primary Arterial Insufficiency Ulcer Etiology: Wound Location: Left Achilles Wound Open Wounding Event: Gradually Appeared Status: Date Acquired: 01/24/2014 Comorbid Cataracts, Chronic sinus Weeks Of Treatment: 15 History: problems/congestion, Congestive Heart Clustered Wound: No Failure, Coronary Artery Disease, Pending Amputation On Presentation Hypertension, Myocardial Infarction, Peripheral Arterial Disease, Type II Diabetes, Neuropathy Photos Photo Uploaded By: Elliot Gurney, RN, BSN, Kim on 08/12/2014 16:32:46 Wound Measurements Length: (cm) 8 % Reduction i Width: (cm) 4.2 % Reduction i Depth: (cm) 0.3 Epithelializa Area: (cm) 26.389 Tunneling: Volume: (cm) 7.917 Position Maximum Judithann Sauger, Lamont L. ([id number]) n Area: -6.7% n Volume: -6.7% tion: Small (1-33%) Yes (o'clock): 12 stance: (cm) 1.5 Wound Description Full Thickness With Exposed Foul O Classification: Support Structures Diabetic Severity Grade 2 (Wagner): Wound Margin: Distinct, outline attached Exudate Amount: Large Exudate Type: Serosanguineous Exudate Color: red, brown dor After Cleansing: No Wound Bed Granulation Amount: Medium (34-66%) Exposed Structure Granulation Quality: Pink Fascia Exposed: No Necrotic Amount: Small (1-33%) Fat Layer Exposed: No Necrotic Quality:  Adherent Slough Tendon Exposed: No Muscle Exposed: No Joint Exposed: No Bone Exposed: No Limited to Skin Breakdown Periwound Skin Texture Texture Color No Abnormalities Noted: No No Abnormalities Noted: No Callus: No Atrophie Blanche: No Crepitus: No Cyanosis: No Excoriation: No Ecchymosis: No Fluctuance: No Erythema: No Friable: No Hemosiderin Staining: No Induration: No Mottled: No Localized Edema: No Pallor: No Rash: No Rubor: No Scarring: No Moisture No Abnormalities Noted: No Dry / Scaly: No Maceration: Yes  Moist: Yes Wound Preparation Ulcer Cleansing: Rinsed/Irrigated with Saline Topical Anesthetic Applied: Other: lidocaine 4%, Treatment Notes Wound #1 (Left Achilles) 1. Cleansed with: Clean wound with Normal Saline Cadavid, Xachary L. ([id number]) 2. Anesthetic Topical Lidocaine 4% cream to wound bed prior to debridement 8. Negative Pressure Wound Therapy Wound Vac to wound continuously at 139mm/hg pressure Wound Vac intermittently at 166mm/hg pressure Black Foam Apply contact layer over base of wound. Number of foam/gauze pieces used in the dressing = Change canister as needed. Notes Contact layer over bone. 1 piece of foam Electronic Signature(s) Signed: 08/12/2014 5:03:22 PM By: Elliot Gurney, RN, BSN, Kim RN, BSN Entered By: Elliot Gurney, RN, BSN, Kim on 08/12/2014 12:05:56 Wyndham, [name] ([id number]) -------------------------------------------------------------------------------- Wound Assessment Details Patient Name: Gradillas, Jasean L. Date of Service: 08/12/2014 11:45 AM Medical Record Number: [id number] Patient Account Number: [id number] Date of Birth/Sex: 06/17/1920 (81 y.o. Male) Treating RN: Huel Coventry Primary Care Physician: Lindwood Qua Other Clinician: Referring Physician: Lindwood Qua Treating Physician/Extender: Rudene Re in Treatment: 15 Wound Status Wound Number: 2 Primary Arterial Insufficiency Ulcer Etiology: Wound Location:  Left, Lateral Calcaneous Wound Open Wounding Event: Gradually Appeared Status: Date Acquired: 01/24/2014 Comorbid Cataracts, Chronic sinus Weeks Of Treatment: 15 History: problems/congestion, Congestive Heart Clustered Wound: Yes Failure, Coronary Artery Disease, Hypertension, Myocardial Infarction, Peripheral Arterial Disease, Type II Diabetes, Neuropathy Photos Photo Uploaded By: Elliot Gurney, RN, BSN, Kim on 08/12/2014 16:32:47 Wound Measurements Length: (cm) 0.3 Width: (cm) 0.4 Depth: (cm) 0.3 Area: (cm) 0.094 Volume: (cm) 0.028 % Reduction in Area: 98.3% % Reduction in Volume: 95% Wound Description Full Thickness Without Exposed Classification: Support Structures Diabetic Severity Grade 2 (Wagner): Exudate Amount: None Present Wound Bed Granulation Amount: Large (67-100%) Exposed Structure Bastien, Burech L. ([id number]) Granulation Quality: Pale Fascia Exposed: No Necrotic Amount: None Present (0%) Fat Layer Exposed: No Tendon Exposed: No Muscle Exposed: No Joint Exposed: No Bone Exposed: No Limited to Skin Breakdown Periwound Skin Texture Texture Color No Abnormalities Noted: No No Abnormalities Noted: No Callus: No Atrophie Blanche: No Crepitus: No Cyanosis: No Excoriation: No Ecchymosis: No Fluctuance: No Erythema: No Friable: No Hemosiderin Staining: No Induration: No Mottled: No Localized Edema: No Pallor: No Rash: No Rubor: No Scarring: No Moisture No Abnormalities Noted: No Dry / Scaly: No Maceration: Yes Moist: No Wound Preparation Ulcer Cleansing: Rinsed/Irrigated with Saline Topical Anesthetic Applied: Other: lidociane 4%, Treatment Notes Wound #2 (Left, Lateral Calcaneous) 1. Cleansed with: Clean wound with Normal Saline 2. Anesthetic Topical Lidocaine 4% cream to wound bed prior to debridement 4. Dressing Applied: Prisma Ag 5. Secondary Dressing Applied Bordered Foam Dressing Electronic Signature(s) Signed: 08/12/2014  5:03:22 PM By: Elliot Gurney, RN, BSN, Kim RN, BSN Entered By: Elliot Gurney, RN, BSN, Kim on 08/12/2014 12:24:02 Luhman, [name] ([id number]) -------------------------------------------------------------------------------- Wound Assessment Details Patient Name: Reznik, Genesis L. Date of Service: 08/12/2014 11:45 AM Medical Record Number: [id number] Patient Account Number: [id number] Date of Birth/Sex: 07-21-1920 (81 y.o. Male) Treating RN: Huel Coventry Primary Care Physician: Lindwood Qua Other Clinician: Referring Physician: Lindwood Qua Treating Physician/Extender: Rudene Re in Treatment: 15 Wound Status Wound Number: 3 Primary Arterial Insufficiency Ulcer Etiology: Wound Location: Left Toe Great Wound Open Wounding Event: Gradually Appeared Status: Date Acquired: 01/24/2014 Comorbid Cataracts, Chronic sinus Weeks Of Treatment: 15 History: problems/congestion, Congestive Heart Clustered Wound: No Failure, Coronary Artery Disease, Hypertension, Myocardial Infarction, Peripheral Arterial Disease, Type II Diabetes, Neuropathy Photos Photo Uploaded By: Elliot Gurney, RN, BSN, Kim on 08/12/2014 16:32:47 Wound Measurements Length: (cm) 0.7 Width: (cm) 0.8 Depth: (cm) 0.2 Area: (  cm) 0.44 Volume: (cm) 0.088 % Reduction in Area: 49.1% % Reduction in Volume: -2.3% Epithelialization: Small (1-33%) Wound Description Full Thickness Without Exposed Classification: Support Structures Diabetic Severity Grade 1 (Wagner): Wound Margin: Flat and Intact Wound Bed Granulation Amount: Small (1-33%) Exposed Structure Fouche, Leven L. ([id number]) Granulation Quality: Pale Fascia Exposed: No Necrotic Amount: Large (67-100%) Fat Layer Exposed: No Necrotic Quality: Adherent Slough Tendon Exposed: No Muscle Exposed: No Joint Exposed: No Bone Exposed: No Limited to Skin Breakdown Periwound Skin Texture Texture Color No Abnormalities Noted: No No Abnormalities Noted: No Moisture No  Abnormalities Noted: No Dry / Scaly: Yes Moist: Yes Wound Preparation Ulcer Cleansing: Rinsed/Irrigated with Saline Topical Anesthetic Applied: Other: lidociane 4%, Treatment Notes Wound #3 (Left Toe Great) 1. Cleansed with: Clean wound with Normal Saline 2. Anesthetic Topical Lidocaine 4% cream to wound bed prior to debridement 4. Dressing Applied: Prisma Ag 5. Secondary Dressing Applied Bordered Foam Dressing Electronic Signature(s) Signed: 08/12/2014 5:03:22 PM By: Elliot Gurney, RN, BSN, Kim RN, BSN Entered By: Elliot Gurney, RN, BSN, Kim on 08/12/2014 12:07:06 Jaimes, [name] ([id number]) -------------------------------------------------------------------------------- Vitals Details Patient Name: Ruan, Harinder L. Date of Service: 08/12/2014 11:45 AM Medical Record Number: [id number] Patient Account Number: [id number] Date of Birth/Sex: 1920/11/27 (81 y.o. Male) Treating RN: Huel Coventry Primary Care Physician: Lindwood Qua Other Clinician: Referring Physician: Lindwood Qua Treating Physician/Extender: Rudene Re in Treatment: 15 Vital Signs Time Taken: 11:56 Temperature (F): 97.9 Height (in): 70 Pulse (bpm): 78 Weight (lbs): 200 Respiratory Rate (breaths/min): 18 Body Mass Index (BMI): 28.7 Blood Pressure (mmHg): 82/40 Reference Range: 80 - 120 mg / dl Electronic Signature(s) Signed: 08/12/2014 5:03:22 PM By: Elliot Gurney, RN, BSN, Kim RN, BSN Entered By: Elliot Gurney, RN, BSN, Kim on 08/12/2014 11:57:13

## 2014-08-13 NOTE — Progress Notes (Signed)
[name], [name] ([id number]) Visit Report for 08/12/2014 Chief Complaint Document Details Patient Name: [name], [name] Date of Service: 08/12/2014 11:45 AM Medical Record Number: [id number] Patient Account Number: [id number] Date of Birth/Sex: [birth year] (81 y.o. Male) Treating RN: Primary Care Physician: Lindwood Qua Other Clinician: Referring Physician: Lindwood Qua Treating Physician/Extender: Rudene Re in Treatment: 15 Information Obtained from: Patient Chief Complaint Patient presents to the wound care center for a consult due non healing wound 81 year old gentleman who comes with a history of having some ulcerated areas on his heels since February 2016 and then a large injury to his left posterior heel and ankle since about a month. Electronic Signature(s) Signed: 08/12/2014 12:39:36 PM By: Evlyn Kanner MD, FACS Entered By: Evlyn Kanner on 08/12/2014 12:39:35 Balaguer, [name] ([id number]) -------------------------------------------------------------------------------- Debridement Details Patient Name: [name], [name] Date of Service: 08/12/2014 11:45 AM Medical Record Number: [id number] Patient Account Number: [id number] Date of Birth/Sex: 12-06-1920 (81 y.o. Male) Treating RN: Primary Care Physician: Lindwood Qua Other Clinician: Referring Physician: Lindwood Qua Treating Physician/Extender: Rudene Re in Treatment: 15 Debridement Performed for Wound #1 Left Achilles Assessment: Performed By: Physician Tristan Schroeder., MD Debridement: Debridement Pre-procedure Yes Verification/Time Out Taken: Start Time: 12:15 Pain Control: Other : lidocaine 4% Level: Skin/Subcutaneous Tissue Total Area Debrided (L x 8 (cm) x 4.2 (cm) = 33.6 (cm) W): Tissue and other Viable, Non-Viable, Exudate, Fibrin/Slough, Subcutaneous material debrided: Instrument: Curette Bleeding: Minimum Hemostasis Achieved: Pressure End Time: 12:17 Procedural Pain:  1 Post Procedural Pain: 1 Response to Treatment: Procedure was tolerated well Post Debridement Measurements of Total Wound Length: (cm) 8 Width: (cm) 4.2 Depth: (cm) 0.4 Volume: (cm) 10.556 Electronic Signature(s) Signed: 08/12/2014 12:39:08 PM By: Evlyn Kanner MD, FACS Entered By: Evlyn Kanner on 08/12/2014 12:39:07 Bugh, Ransom Elbert Ewings ([id number]) -------------------------------------------------------------------------------- Debridement Details Patient Name: [name], [name] Date of Service: 08/12/2014 11:45 AM Medical Record Number: [id number] Patient Account Number: [id number] Date of Birth/Sex: Jul 12, 1920 (81 y.o. Male) Treating RN: Primary Care Physician: Lindwood Qua Other Clinician: Referring Physician: Lindwood Qua Treating Physician/Extender: Rudene Re in Treatment: 15 Debridement Performed for Wound #2 Left,Lateral Calcaneous Assessment: Performed By: Physician Tristan Schroeder., MD Debridement: Debridement Pre-procedure Yes Verification/Time Out Taken: Start Time: 12:19 Pain Control: Other : lidocaine 4% Level: Skin/Subcutaneous Tissue/Muscle/Bone Total Area Debrided (L x 0.3 (cm) x 0.4 (cm) = 0.12 (cm) W): Tissue and other Viable, Non-Viable, Exudate, Fibrin/Slough, Skin, Subcutaneous material debrided: Instrument: Curette Bleeding: Minimum Hemostasis Achieved: Pressure End Time: 12:21 Procedural Pain: 0 Post Procedural Pain: 0 Response to Treatment: Procedure was tolerated well Post Debridement Measurements of Total Wound Length: (cm) 0.4 Width: (cm) 0.5 Depth: (cm) 0.3 Volume: (cm) 0.047 Electronic Signature(s) Signed: 08/12/2014 12:39:17 PM By: Evlyn Kanner MD, FACS Entered By: Evlyn Kanner on 08/12/2014 12:39:16 Spindler, Robbert Elbert Ewings ([id number]) -------------------------------------------------------------------------------- Debridement Details Patient Name: [name], [name] Date of Service: 08/12/2014 11:45 AM Medical Record  Number: [id number] Patient Account Number: [id number] Date of Birth/Sex: [birth year] (81 y.o. Male) Treating RN: Primary Care Physician: Lindwood Qua Other Clinician: Referring Physician: Lindwood Qua Treating Physician/Extender: Rudene Re in Treatment: 15 Debridement Performed for Wound #3 Left Toe Great Assessment: Performed By: Physician Tristan Schroeder., MD Debridement: Debridement Pre-procedure Yes Verification/Time Out Taken: Start Time: 12:15 Pain Control: Other : lidocaine 4% Level: Skin/Subcutaneous Tissue Total Area Debrided (L x 0.7 (cm) x 0.8 (cm) = 0.56 (cm) W): Tissue and other Viable, Non-Viable, Exudate, Fibrin/Slough, Subcutaneous material debrided: Instrument: Curette Bleeding: Minimum Hemostasis Achieved: Pressure End Time:  12:17 Procedural Pain: 1 Post Procedural Pain: 1 Response to Treatment: Procedure was tolerated well Post Debridement Measurements of Total Wound Length: (cm) 0.7 Width: (cm) 0.8 Depth: (cm) 0.3 Volume: (cm) 0.132 Electronic Signature(s) Signed: 08/12/2014 12:39:24 PM By: Evlyn Kanner MD, FACS Entered By: Evlyn Kanner on 08/12/2014 12:39:24 [place], Wenzel Elbert Ewings ([id number]) -------------------------------------------------------------------------------- HPI Details Patient Name: [name], [name] Date of Service: 08/12/2014 11:45 AM Medical Record Number: [id number] Patient Account Number: [id number] Date of Birth/Sex: February 28, 1920 (81 y.o. Male) Treating RN: Primary Care Physician: Lindwood Qua Other Clinician: Referring Physician: Lindwood Qua Treating Physician/Extender: Rudene Re in Treatment: 15 History of Present Illness HPI Description: 81 year old gentleman who was known to be a diabetic for many years has peripheral neuropathy recently went to his primary care doctor for a punctured wound on his left heel which was something he had noticed. The patient then was referred to a podiatrist who  referred him to Dr. Wyn Quaker in the vascular surgery department and I understand the procedure was done on 03/14/2014 with a left lower extremity vascular procedure and stenting was done. Details of this are not available at the present time. The patient has been applying Neosporin to his leg and has not had any wound care addressed so far. patient has also had a history of coronary artery disease in the past and hasn't had a CABG and a pacemaker placement in the remote past.Other details and notes are pending. the patient is not in pain lives alone and has some home health and other aides coming to help him with his daily chores and meals. reviewing the vascular notes I understand the procedure was done on 03/14/2014 and he was operated by Dr. Wyn Quaker. he had a catheter placement to the left peroneal artery and a aortogram and selective left lower extremity angiogram was done. He also had a percutaneous transluminal angioplasty of the left peroneal artery and the tibioperoneal trunk. The distal SFA and above-knee popliteal artery were also angioplastied. A subcutaneous stent placement to the distal superficial femoral artery was also done. 05/05/2014 -- the patient has not had any change in his health and after much consideration is decided that he does not want HBOT as he is claustrophobic and was unable to tolerate being in the chamber for 90 minutes. I have discussed with him that his most recent x-ray of the foot shows that he has the distal phalanx of the left great toe showing changes with osteomyelitis cannot be excluded at that site. He tells me that he cannot have an MRI because of his defibrillator and hence we will order a triple phase bone scan. I have also reviewed his culture report which shows several organisms and he has sensitivity to tetracycline and we have recommended he takes this for 14 days and this has been given to him on 05/02/2014 05/12/2014 the bone scan done on 05/10/2014  shows #1 findings are worrisome for osteomyelitis involving the calcaneus of the left foot, #2 increase uptake localizing to the left second and third toe on all 3 phases. Cannot rule out osteomyelitis in this area. And #3 left foot cellulitis. 05/19/2014 -- reviewed several reports which we have received back on [facility] Coom[facility]. #1 chest x-ray done on April 21 shows chronic changes in the left base no acute findings. #2 his CBC is within normal limits. #3 hemoglobin A1c was 8.5%. #4 EKG done on 26 April was within normal limits due to his electronic ventricular pacemaker. 05/26/2014 -- he has had his PICC  line placed and is taking vancomycin daily basis. He is to start hyperbaric oxygen therapy on this coming Monday. 06/09/2014 -- he saw Dr. Sampson Goon yesterday and besides his IV antibiotic I believe a oral antibiotic was SHAHEEM, PICHON. ([id number]) also given and this may be Cipro. Patient is feeling fine otherwise does not have any symptoms though his blood pressure this morning in the wound center has been 90/50. We will check his blood pressure in the supine position. 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 Complained of worsening fatigue yesterday after HBO. Feels better today. Has appointment with PCP this afternoon. He wishes to hold off on HBO until next week. No significant pain. No fever or chills. Stable drainage. 06/23/2014 Nazar has taken a break from HBO T and has been feeling a little better. He was seen by the nurse practitioner at his PCPs office and at that time his vitals were stable and they did get some EKG done which was within normal limits. There have sent out some lab work which we still have to receive reports. He is going to see his PCP back this afternoon and will be seeing Dr. Sampson Goon tomorrow for review. addendum: we have received labs from his PCPs office and note that his potassium is high at 5.4 and  his BUN/creatinine is slightly raised. His blood sugar was 216. His total protein and albumen were 5.9 and 3 and this was low. His HandH was 10.3 and 31.4 WBC was 8.2 and his platelets were 304. CRP was 29.6 which was high. Vancomycin trough was 19.1 which was normal TSH was 3.11 which was normal 06/30/2014 -- Eliberto Ivory feels weak today and he feels his stomach is acting up and his not feeling up to doing hyperbaric oxygen therapy. He would like to take a break today and tomorrow and resume on Monday. He is on vancomycin and Levaquin as per ID and he has an appointment to see the vascular surgeons in about 10 days' time. 07/07/2014 -- Eliberto Ivory has been feeling weak all along and he has decided that he is not going to continue with hyperbaric oxygen therapy and a longer period. He had finished 16 of his 40 treatments and will not take anymore. He has an appointment with ID tomorrow and also his vascular surgeons and I will have a conference with both of them regarding his further care. 07/14/2014 -- over the last week I have contacted Dr. Sampson Goon and Dr. Wyn Quaker and discussed his care. Dr. Wyn Quaker agrees that he will take him to the OR and debrided his wound and use a wound VAC. Dr. Sampson Goon has altered his anti-biotic regimen - vancomycin has been stopped and he is on oral levofloxacin. The patient's surgery is scheduled for July 7. 07/21/2014 -- he continues to feel unwell and has a lot of GI disturbances to probably his antibiotics. He also has developed a new blister on his left fourth toe and is concerned about his upcoming surgery which is scheduled for next Thursday. 08/04/2014 -- he was taken to the OR on 07/28/2014 and a debridement of the left heel was done of skin and soft tissue and the exposed calcaneum to remove all nonviable tissue. A wound VAC was then applied at the end of the procedure. 08/12/2014 -- he was seen by Dr. Sampson Goon today and he is going to continue one more week of  Levaquin and then stop it and see him back in 3 weeks' time. I  have reviewed his notes from today. Electronic Signature(s) Signed: 08/12/2014 12:40:28 PM By: Evlyn Kanner MD, FACS Entered By: Evlyn Kanner on 08/12/2014 12:40:27 Carvin, [name] ([id number]) Gick, Kailen Elbert Ewings ([id number]) -------------------------------------------------------------------------------- Physical Exam Details Patient Name: Pate, Demyan L. Date of Service: 08/12/2014 11:45 AM Medical Record Number: [id number] Patient Account Number: [id number] Date of Birth/Sex: [birth year] (81 y.o. Male) Treating RN: Primary Care Physician: Lindwood Qua Other Clinician: Referring Physician: Lindwood Qua Treating Physician/Extender: Rudene Re in Treatment: 15 Constitutional . Pulse regular. Respirations normal and unlabored. Afebrile. . Eyes Nonicteric. Reactive to light. Ears, Nose, Mouth, and Throat Lips, teeth, and gums WNL.Marland Kitchen Moist mucosa without lesions . Neck supple and nontender. No palpable supraclavicular or cervical adenopathy. Normal sized without goiter. Respiratory WNL. No retractions.. Cardiovascular Pedal Pulses WNL. No clubbing, cyanosis or edema. Chest Breasts symmetical and no nipple discharge.. Breast tissue WNL, no masses, lumps, or tenderness.. Gastrointestinal (GI) Abdomen without masses or tenderness.. No liver or spleen enlargement or tenderness.. Genitourinary (GU) No hydrocele, spermatocele, tenderness of the cord, or testicular mass.Marland Kitchen Penis without lesions.Renetta [name] without lesions. No cystocele, or rectocele. Pelvic support intact, no discharge. Marland Kitchen Urethra without masses, tenderness or scarring.Marland Kitchen Lymphatic No adneopathy. No adenopathy. No adenopathy. Musculoskeletal Adexa without tenderness or enlargement.. Digits and nails w/o clubbing, cyanosis, infection, petechiae, ischemia, or inflammatory conditions.. Integumentary (Hair, Skin) No suspicious lesions. No crepitus or  fluctuance. No peri-wound warmth or erythema. No masses.Marland Kitchen Psychiatric Judgement and insight Intact.. No evidence of depression, anxiety, or agitation.. Notes After sharply debriding his left big toe, calcaneum and and the Achilles tendon overall all the wounds are looking much cleaner and granulating well. Electronic Signature(s) QUENTON, RECENDEZ ([id number]) Signed: 08/12/2014 12:41:28 PM By: Evlyn Kanner MD, FACS Entered By: Evlyn Kanner on 08/12/2014 12:41:28 Wieseler, [name] ([id number]) -------------------------------------------------------------------------------- Physician Orders Details Patient Name: Spayd, Raylon L. Date of Service: 08/12/2014 11:45 AM Medical Record Number: [id number] Patient Account Number: [id number] Date of Birth/Sex: [birth year] (81 y.o. Male) Treating RN: Huel Coventry Primary Care Physician: Lindwood Qua Other Clinician: Referring Physician: Lindwood Qua Treating Physician/Extender: Rudene Re in Treatment: 15 Verbal / Phone Orders: Yes Clinician: Huel Coventry Read Back and Verified: Yes Diagnosis Coding Wound Cleansing Wound #1 Left Achilles o Clean wound with Normal Saline. o May Shower, gently pat wound dry prior to applying new dressing. o May shower with protection. Wound #2 Left,Lateral Calcaneous o Clean wound with Normal Saline. o May Shower, gently pat wound dry prior to applying new dressing. o May shower with protection. Wound #3 Left Toe Great o Clean wound with Normal Saline. o May Shower, gently pat wound dry prior to applying new dressing. o May shower with protection. Anesthetic Wound #1 Left Achilles o Topical Lidocaine 4% cream applied to wound bed prior to debridement Wound #2 Left,Lateral Calcaneous o Topical Lidocaine 4% cream applied to wound bed prior to debridement Wound #3 Left Toe Great o Topical Lidocaine 4% cream applied to wound bed prior to debridement Skin Barriers/Peri-Wound  Care Wound #1 Left Achilles o Skin Prep Wound #2 Left,Lateral Calcaneous o Skin Prep Wound #3 Left Toe Great o Skin Prep Primary Wound Dressing Michael, Nandan L. ([id number]) Wound #2 Left,Lateral Calcaneous o Prisma Ag Wound #3 Left Toe Great o Prisma Ag Wound #1 Left Achilles o Mepitel One Secondary Dressing Wound #2 Left,Lateral Calcaneous o Gauze and Kerlix/Conform Wound #3 Left Toe Great o Gauze and Kerlix/Conform Dressing Change Frequency Wound #1 Left Achilles o Change Dressing Monday, Wednesday, Friday Follow-up Appointments  Wound #1 Left Achilles o Return Appointment in 1 week. Wound #2 Left,Lateral Calcaneous o Return Appointment in 1 week. Wound #3 Left Toe Great o Return Appointment in 1 week. Home Health Wound #1 Left Achilles o Continue Home Health Visits - [facility] - To change Monday, Wednesday, Friday. No office visit next week. o Home Health Nurse may visit PRN to address patientos wound care needs. o FACE TO FACE ENCOUNTER: MEDICARE and MEDICAID PATIENTS: I certify that this patient is under my care and that I had a face-to-face encounter that meets the physician face-to-face encounter requirements with this patient on this date. The encounter with the patient was in whole or in part for the following MEDICAL CONDITION: (primary reason for Home Healthcare) MEDICAL NECESSITY: I certify, that based on my findings, NURSING services are a medically necessary home health service. HOME BOUND STATUS: I certify that my clinical findings support that this patient is homebound (i.e., Due to illness or injury, pt requires aid of supportive devices such as crutches, cane, wheelchairs, walkers, the use of special transportation or the assistance of another person to leave their place of residence. There is a normal inability to leave the home and doing so requires considerable and taxing effort. Other absences are for medical reasons /  religious services and are infrequent or of short duration when for other reasons). Reede, Maikel L. ([id number]) o If current dressing causes regression in wound condition, may D/C ordered dressing product/s and apply Normal Saline Moist Dressing daily until next Wound Healing Center / Other MD appointment. Notify Wound Healing Center of regression in wound condition at [phone]. o Please direct any NON-WOUND related issues/requests for orders to patient's Primary Care Physician Wound #2 Left,Lateral Calcaneous o Continue Home Health Visits - [facility] - To change Monday, Wednesday, Friday. No office visit next week. o Home Health Nurse may visit PRN to address patientos wound care needs. o FACE TO FACE ENCOUNTER: MEDICARE and MEDICAID PATIENTS: I certify that this patient is under my care and that I had a face-to-face encounter that meets the physician face-to-face encounter requirements with this patient on this date. The encounter with the patient was in whole or in part for the following MEDICAL CONDITION: (primary reason for Home Healthcare) MEDICAL NECESSITY: I certify, that based on my findings, NURSING services are a medically necessary home health service. HOME BOUND STATUS: I certify that my clinical findings support that this patient is homebound (i.e., Due to illness or injury, pt requires aid of supportive devices such as crutches, cane, wheelchairs, walkers, the use of special transportation or the assistance of another person to leave their place of residence. There is a normal inability to leave the home and doing so requires considerable and taxing effort. Other absences are for medical reasons / religious services and are infrequent or of short duration when for other reasons). o If current dressing causes regression in wound condition, may D/C ordered dressing product/s and apply Normal Saline Moist Dressing daily until next Wound Healing Center / Other  MD appointment. Notify Wound Healing Center of regression in wound condition at [phone]. o Please direct any NON-WOUND related issues/requests for orders to patient's Primary Care Physician Wound #3 Left Toe Great o Continue Home Health Visits - [facility] - To change Monday, Wednesday, Friday. No office visit next week. o Home Health Nurse may visit PRN to address patientos wound care needs. o FACE TO FACE ENCOUNTER: MEDICARE and MEDICAID PATIENTS: I certify that this patient is under my  care and that I had a face-to-face encounter that meets the physician face-to-face encounter requirements with this patient on this date. The encounter with the patient was in whole or in part for the following MEDICAL CONDITION: (primary reason for Home Healthcare) MEDICAL NECESSITY: I certify, that based on my findings, NURSING services are a medically necessary home health service. HOME BOUND STATUS: I certify that my clinical findings support that this patient is homebound (i.e., Due to illness or injury, pt requires aid of supportive devices such as crutches, cane, wheelchairs, walkers, the use of special transportation or the assistance of another person to leave their place of residence. There is a normal inability to leave the home and doing so requires considerable and taxing effort. Other absences are for medical reasons / religious services and are infrequent or of short duration when for other reasons). o If current dressing causes regression in wound condition, may D/C ordered dressing product/s and apply Normal Saline Moist Dressing daily until next Wound Healing Center / Other MD appointment. Notify Wound Healing Center of regression in wound condition at [phone]. o Please direct any NON-WOUND related issues/requests for orders to patient's Primary Care Physician ROLLAND, STEINERT ([id number]) Negative Pressure Wound Therapy Wound #1 Left Achilles o Wound VAC settings at  125/130 mmHg continuous pressure. Use BLACK/GREEN foam to wound cavity. Use WHITE foam to fill any tunnel/s and/or undermining. Change VAC dressing 3 X WEEK. Change canister as indicated when full. Nurse may titrate settings and frequency of dressing changes as clinically indicated. o Home Health Nurse may d/c VAC for s/s of increased infection, significant wound regression, or uncontrolled drainage. Notify Wound Healing Center at [phone]. o Apply contact layer over base of wound. - Over bone o Number of foam/gauze pieces used in the dressing = Electronic Signature(s) Signed: 08/12/2014 12:46:51 PM By: Evlyn Kanner MD, FACS Signed: 08/12/2014 5:03:22 PM By: Elliot Gurney RN, BSN, Kim RN, BSN Entered By: Elliot Gurney, RN, BSN, Kim on 08/12/2014 12:28:48 SOLOMAN, MCKEITHAN ([id number]) -------------------------------------------------------------------------------- Prescription 08/12/2014 Patient Name: Mariscal, Donnavin L. Physician: Evlyn Kanner MD Date of Birth: [birth year] NPI#: [id number] Sex: Judie Petit DEA#: [id number] Phone #: [phone] License #: Patient Address: [facility] [address] [place], [place] [address] [facility] [address], [address] [place], [place] [address] [phone] Allergies ACE Inhibitors Reaction: chest pain Severity: Severe Physician's Orders Wound VAC settings at 125/130 mmHg continuous pressure. Use BLACK/GREEN foam to wound cavity. Use WHITE foam to fill any tunnel/s and/or undermining. Change VAC dressing 3 X WEEK. Change canister as indicated when full. Nurse may titrate settings and frequency of dressing changes as clinically indicated. Signature(s): Date(s): Psychologist, prison and probation services) Signed: 08/12/2014 12:46:51 PM By: Evlyn Kanner MD, FACS Signed: 08/12/2014 5:03:22 PM By: Elliot Gurney RN, BSN, Kim RN, BSN Entered By: Elliot Gurney, RN, BSN, Kim on 08/12/2014 12:28:49 Woelfel, [name]  ([id number]) --------------------------------------------------------------------------------  Problem List Details Patient Name: Shuler, Akia L. Date of Service: 08/12/2014 11:45 AM Medical Record Number: [id number] Patient Account Number: [id number] Date of Birth/Sex: 1920-11-04 (81 y.o. 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 Date of Service: 08/12/2014 11:45 AM Medical Record Number: [id number] Patient Account Number: [id number] Date of Birth/Sex: [birth year] (81 y.o. Male) Treating RN: Primary Care Physician: Lindwood Qua Other Clinician: Referring Physician: Lindwood Qua Treating Physician/Extender: Rudene Re in Treatment: 15 Subjective Chief Complaint Information obtained from Patient Patient presents to the wound care center for a consult due non healing wound 81 year old gentleman who comes with a history of having some ulcerated areas on his heels since February 2016 and then a large injury to his left  posterior heel and ankle since about a month. History of Present Illness (HPI) 81 year old gentleman who was known to be a diabetic for many years has peripheral neuropathy recently went to his primary care doctor for a punctured wound on his left heel which was something he had noticed. The patient then was referred to a podiatrist who referred him to Dr. Wyn Quaker in the vascular surgery department and I understand the procedure was done on 03/14/2014 with a left lower extremity vascular procedure and stenting was done. Details of this are not available at the present time. The patient has been applying Neosporin to his leg and has not had any wound care addressed so far. patient has also had a history of coronary artery disease in the past and hasn't had a CABG and a pacemaker placement in the remote past.Other details and notes are pending. the patient is not in pain lives alone and has some home health and other aides coming to help him with his daily chores and meals. reviewing the vascular notes I understand the procedure was done on 03/14/2014 and he was operated by Dr. Wyn Quaker. he had a catheter placement to the left peroneal artery and a aortogram and selective left lower extremity angiogram was done. He also had a percutaneous transluminal angioplasty of the left peroneal artery and the tibioperoneal trunk. The distal SFA and above-knee popliteal artery were also angioplastied. A subcutaneous stent placement to the distal superficial femoral artery was also done. 05/05/2014 -- the patient has not had any change in his health and after much consideration is decided that he does not want HBOT as he is claustrophobic and was unable to tolerate being in the chamber for 90 minutes. I have discussed with him that his most recent x-ray of the foot shows that he has the distal phalanx of the left great toe showing changes with osteomyelitis cannot be excluded at that site. He tells me that he  cannot have an MRI because of his defibrillator and hence we will order a triple phase bone scan. I have also reviewed his culture report which shows several organisms and he has sensitivity to tetracycline and we have recommended he takes this for 14 days and this has been given to him on 05/02/2014 05/12/2014 the bone scan done on 05/10/2014 shows #1 findings are worrisome for osteomyelitis involving the calcaneus of the left foot, #2 increase uptake localizing to the left second and third toe on all 3 phases. Cannot rule out osteomyelitis in this area. And #3 left foot cellulitis. 05/19/2014 -- reviewed several reports which we have received back on [facility]. #1 chest x-ray done on April 21 shows  chronic changes in the left base no acute findings. Budnick, Nino L. ([id number]) #2 his CBC is within normal limits. #3 hemoglobin A1c was 8.5%. #4 EKG done on 26 April was within normal limits due to his electronic ventricular pacemaker. 05/26/2014 -- he has had his PICC line placed and is taking vancomycin daily basis. He is to start hyperbaric oxygen therapy on this coming Monday. 06/09/2014 -- he saw Dr. Sampson Goon yesterday and besides his IV antibiotic I believe a oral antibiotic was also given and this may be Cipro. Patient is feeling fine otherwise does not have any symptoms though his blood pressure this morning in the wound center has been 90/50. We will check his blood pressure in the supine position. 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 Complained of worsening fatigue yesterday after HBO. Feels better today. Has appointment with PCP this afternoon. He wishes to hold off on HBO until next week. No significant pain. No fever or chills. Stable drainage. 06/23/2014 Atha has taken a break from HBO T and has been feeling a little better. He was seen by the nurse practitioner at his PCPs office and at that time his vitals were stable and they  did get some EKG done which was within normal limits. There have sent out some lab work which we still have to receive reports. He is going to see his PCP back this afternoon and will be seeing Dr. Sampson Goon tomorrow for review. addendum: we have received labs from his PCPs office and note that his potassium is high at 5.4 and his BUN/creatinine is slightly raised. His blood sugar was 216. His total protein and albumen were 5.9 and 3 and this was low. His HandH was 10.3 and 31.4 WBC was 8.2 and his platelets were 304. CRP was 29.6 which was high. Vancomycin trough was 19.1 which was normal TSH was 3.11 which was normal 06/30/2014 -- Eliberto Ivory feels weak today and he feels his stomach is acting up and his not feeling up to doing hyperbaric oxygen therapy. He would like to take a break today and tomorrow and resume on Monday. He is on vancomycin and Levaquin as per ID and he has an appointment to see the vascular surgeons in about 10 days' time. 07/07/2014 -- Eliberto Ivory has been feeling weak all along and he has decided that he is not going to continue with hyperbaric oxygen therapy and a longer period. He had finished 16 of his 40 treatments and will not take anymore. He has an appointment with ID tomorrow and also his vascular surgeons and I will have a conference with both of them regarding his further care. 07/14/2014 -- over the last week I have contacted Dr. Sampson Goon and Dr. Wyn Quaker and discussed his care. Dr. Wyn Quaker agrees that he will take him to the OR and debrided his wound and use a wound VAC. Dr. Sampson Goon has altered his anti-biotic regimen - vancomycin has been stopped and he is on oral levofloxacin. The patient's surgery is scheduled for July 7. 07/21/2014 -- he continues to feel unwell and has a lot of GI disturbances to probably his antibiotics. He also has developed a new blister on his left fourth toe and is concerned about his upcoming surgery which is scheduled for next  Thursday. 08/04/2014 -- he was taken to the OR on 07/28/2014 and a debridement of the left heel was done of skin and soft tissue and the exposed calcaneum to remove all nonviable tissue. A  wound VAC was then applied at the end of the procedure. ANGELINO, RUMERY ([id number]) 08/12/2014 -- he was seen by Dr. Sampson Goon today and he is going to continue one more week of Levaquin and then stop it and see him back in 3 weeks' time. I have reviewed his notes from today. Objective Constitutional Pulse regular. Respirations normal and unlabored. Afebrile. Vitals Time Taken: 11:56 AM, Height: 70 in, Weight: 200 lbs, BMI: 28.7, Temperature: 97.9 F, Pulse: 78 bpm, Respiratory Rate: 18 breaths/min, Blood Pressure: 82/40 mmHg. Eyes Nonicteric. Reactive to light. Ears, Nose, Mouth, and Throat Lips, teeth, and gums WNL.Marland Kitchen Moist mucosa without lesions . Neck supple and nontender. No palpable supraclavicular or cervical adenopathy. Normal sized without goiter. Respiratory WNL. No retractions.. Cardiovascular Pedal Pulses WNL. No clubbing, cyanosis or edema. Chest Breasts symmetical and no nipple discharge.. Breast tissue WNL, no masses, lumps, or tenderness.. Gastrointestinal (GI) Abdomen without masses or tenderness.. No liver or spleen enlargement or tenderness.. Genitourinary (GU) No hydrocele, spermatocele, tenderness of the cord, or testicular mass.Marland Kitchen Penis without lesions.Renetta [name] without lesions. No cystocele, or rectocele. Pelvic support intact, no discharge. Marland Kitchen Urethra without masses, tenderness or scarring.Marland Kitchen Lymphatic No adneopathy. No adenopathy. No adenopathy. Musculoskeletal Adexa without tenderness or enlargement.. Digits and nails w/o clubbing, cyanosis, infection, petechiae, ischemia, or inflammatory conditions.Marland Kitchen TEVON, BERHANE ([id number]) Psychiatric Judgement and insight Intact.. No evidence of depression, anxiety, or agitation.. General Notes: After sharply debriding his  left big toe, calcaneum and and the Achilles tendon overall all the wounds are looking much cleaner and granulating well. Integumentary (Hair, Skin) No suspicious lesions. No crepitus or fluctuance. No peri-wound warmth or erythema. No masses.. Wound #1 status is Open. Original cause of wound was Gradually Appeared. The wound is located on the Left Achilles. The wound measures 8cm length x 4.2cm width x 0.3cm depth; 26.389cm^2 area and 7.917cm^3 volume. The wound is limited to skin breakdown. There is tunneling at 12:00 with a maximum distance of 1.5cm. There is a large amount of serosanguineous drainage noted. The wound margin is distinct with the outline attached to the wound base. There is medium (34-66%) pink granulation within the wound bed. There is a small (1-33%) amount of necrotic tissue within the wound bed including Adherent Slough. The periwound skin appearance exhibited: Maceration, Moist. The periwound skin appearance did not exhibit: Callus, Crepitus, Excoriation, Fluctuance, Friable, Induration, Localized Edema, Rash, Scarring, Dry/Scaly, Atrophie Blanche, Cyanosis, Ecchymosis, Hemosiderin Staining, Mottled, Pallor, Rubor, Erythema. Wound #2 status is Open. Original cause of wound was Gradually Appeared. The wound is located on the Left,Lateral Calcaneous. The wound measures 0.3cm length x 0.4cm width x 0.3cm depth; 0.094cm^2 area and 0.028cm^3 volume. The wound is limited to skin breakdown. There is a none present amount of drainage noted. There is large (67-100%) pale granulation within the wound bed. There is no necrotic tissue within the wound bed. The periwound skin appearance exhibited: Maceration. The periwound skin appearance did not exhibit: Callus, Crepitus, Excoriation, Fluctuance, Friable, Induration, Localized Edema, Rash, Scarring, Dry/Scaly, Moist, Atrophie Blanche, Cyanosis, Ecchymosis, Hemosiderin Staining, Mottled, Pallor, Rubor, Erythema. Wound #3 status is  Open. Original cause of wound was Gradually Appeared. The wound is located on the Left Toe Great. The wound measures 0.7cm length x 0.8cm width x 0.2cm depth; 0.44cm^2 area and 0.088cm^3 volume. The wound is limited to skin breakdown. The wound margin is flat and intact. There is small (1-33%) pale granulation within the wound bed. There is a large (67-100%) amount of necrotic tissue within  the wound bed including Adherent Slough. The periwound skin appearance exhibited: Dry/Scaly, Moist. 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 ([id number]) We will use Prisma on his toe, has lateral calcaneum and put a piece under the foam and nonadherent dressing off his Achilles tendon which is being dressed with a wound VAC. He will have home health changes dressing 3 times next week and will see me back on August 1. 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 There was a Skin/Subcutaneous Tissue Debridement ([id number]) debridement with total area of 33.6 sq cm performed by Juniel Groene, Ignacia Felling., MD. with the following instrument(s): Curette to remove Viable and Non-Viable tissue/material including Exudate, Fibrin/Slough, and Subcutaneous after achieving pain control using Other (lidocaine 4%). A time out was conducted prior to the start of the procedure. A Minimum amount of bleeding was controlled with Pressure. The procedure was tolerated well with a pain level of 1 throughout and a pain level of 1 following the procedure. Post Debridement Measurements: 8cm length x 4.2cm width x 0.4cm  depth; 10.556cm^3 volume. Wound #2 Wound #2 is an Arterial Insufficiency Ulcer located on the Left,Lateral Calcaneous . There was a Skin/Subcutaneous Tissue/Muscle/Bone Debridement ([id number]) debridement with total area of 0.12 sq cm performed by Dominica Kent, Ignacia Felling., MD. with the following instrument(s): Curette to remove Viable and Non- Viable tissue/material including Exudate, Fibrin/Slough, Skin, and Subcutaneous after achieving pain control using Other (lidocaine 4%). A time out was conducted prior to the start of the procedure. A Minimum amount of bleeding was controlled with Pressure. The procedure was tolerated well with a pain level of 0 throughout and a pain level of 0 following the procedure. Post Debridement Measurements: 0.4cm length x 0.5cm width x 0.3cm depth; 0.047cm^3 volume. Wound #3 Wound #3 is an Arterial Insufficiency Ulcer located on the Left Toe Great . There was a Skin/Subcutaneous Tissue Debridement ([id number]) debridement with total area of 0.56 sq cm performed by Trung Wenzl, Ignacia Felling., MD. with the following instrument(s): Curette to remove Viable and Non-Viable tissue/material including Exudate, Fibrin/Slough, and Subcutaneous after achieving pain control using Other (lidocaine 4%). A time out was conducted prior to the start of the procedure. A Minimum amount of bleeding was controlled with Pressure. The procedure was tolerated well with a pain level of 1 throughout and a pain level of 1 following the procedure. Post Debridement Measurements: 0.7cm length x 0.8cm width x 0.3cm depth; 0.132cm^3 volume. Plan Nettle, Juanmanuel L. ([id number]) Wound Cleansing: Wound #1 Left Achilles: Clean wound with Normal Saline. May Shower, gently pat wound dry prior to applying new dressing. May shower with protection. Wound #2 Left,Lateral Calcaneous: Clean wound with Normal Saline. May Shower, gently pat wound dry prior to applying new dressing. May shower with protection. Wound  #3 Left Toe Great: Clean wound with Normal Saline. May Shower, gently pat wound dry prior to applying new dressing. May shower with protection. Anesthetic: Wound #1 Left Achilles: Topical Lidocaine 4% cream applied to wound bed prior to debridement Wound #2 Left,Lateral Calcaneous: Topical Lidocaine 4% cream applied to wound bed prior to debridement Wound #3 Left Toe Great: Topical Lidocaine 4% cream  applied to wound bed prior to debridement Skin Barriers/Peri-Wound Care: Wound #1 Left Achilles: Skin Prep Wound #2 Left,Lateral Calcaneous: Skin Prep Wound #3 Left Toe Great: Skin Prep Primary Wound Dressing: Wound #2 Left,Lateral Calcaneous: Prisma Ag Wound #3 Left Toe Great: Prisma Ag Wound #1 Left Achilles: Mepitel One Secondary Dressing: Wound #2 Left,Lateral Calcaneous: Gauze and Kerlix/Conform Wound #3 Left Toe Great: Gauze and Kerlix/Conform Dressing Change Frequency: Wound #1 Left Achilles: Change Dressing Monday, Wednesday, Friday Follow-up Appointments: Wound #1 Left Achilles: Return Appointment in 1 week. Wound #2 Left,Lateral Calcaneous: Return Appointment in 1 week. Wound #3 Left Toe Great: Return Appointment in 1 week. Home Health: ELIZEO, RODRIQUES ([id number]) Wound #1 Left Achilles: Continue Home Health Visits - [facility] - To change Monday, Wednesday, Friday. No office visit next week. Home Health Nurse may visit PRN to address patient s wound care needs. FACE TO FACE ENCOUNTER: MEDICARE and MEDICAID PATIENTS: I certify that this patient is under my care and that I had a face-to-face encounter that meets the physician face-to-face encounter requirements with this patient on this date. The encounter with the patient was in whole or in part for the following MEDICAL CONDITION: (primary reason for Home Healthcare) MEDICAL NECESSITY: I certify, that based on my findings, NURSING services are a medically necessary home health service. HOME BOUND STATUS: I  certify that my clinical findings support that this patient is homebound (i.e., Due to illness or injury, pt requires aid of supportive devices such as crutches, cane, wheelchairs, walkers, the use of special transportation or the assistance of another person to leave their place of residence. There is a normal inability to leave the home and doing so requires considerable and taxing effort. Other absences are for medical reasons / religious services and are infrequent or of short duration when for other reasons). If current dressing causes regression in wound condition, may D/C ordered dressing product/s and apply Normal Saline Moist Dressing daily until next Wound Healing Center / Other MD appointment. Notify Wound Healing Center of regression in wound condition at [phone]. Please direct any NON-WOUND related issues/requests for orders to patient's Primary Care Physician Wound #2 Left,Lateral Calcaneous: Continue Home Health Visits - [facility] - To change Monday, Wednesday, Friday. No office visit next week. Home Health Nurse may visit PRN to address patient s wound care needs. FACE TO FACE ENCOUNTER: MEDICARE and MEDICAID PATIENTS: I certify that this patient is under my care and that I had a face-to-face encounter that meets the physician face-to-face encounter requirements with this patient on this date. The encounter with the patient was in whole or in part for the following MEDICAL CONDITION: (primary reason for Home Healthcare) MEDICAL NECESSITY: I certify, that based on my findings, NURSING services are a medically necessary home health service. HOME BOUND STATUS: I certify that my clinical findings support that this patient is homebound (i.e., Due to illness or injury, pt requires aid of supportive devices such as crutches, cane, wheelchairs, walkers, the use of special transportation or the assistance of another person to leave their place of residence. There is a normal inability  to leave the home and doing so requires considerable and taxing effort. Other absences are for medical reasons / religious services and are infrequent or of short duration when for other reasons). If current dressing causes regression in wound condition, may D/C ordered dressing product/s and apply Normal Saline Moist Dressing daily until next Wound Healing Center / Other MD appointment. Notify Wound Healing Center of regression  in wound condition at [phone]. Please direct any NON-WOUND related issues/requests for orders to patient's Primary Care Physician Wound #3 Left Toe Great: Continue Home Health Visits - [facility] - To change Monday, Wednesday, Friday. No office visit next week. Home Health Nurse may visit PRN to address patient s wound care needs. FACE TO FACE ENCOUNTER: MEDICARE and MEDICAID PATIENTS: I certify that this patient is under my care and that I had a face-to-face encounter that meets the physician face-to-face encounter requirements with this patient on this date. The encounter with the patient was in whole or in part for the following MEDICAL CONDITION: (primary reason for Home Healthcare) MEDICAL NECESSITY: I certify, that based on my findings, NURSING services are a medically necessary home health service. HOME BOUND STATUS: I certify that my clinical findings support that this patient is homebound (i.e., Due to illness or injury, pt requires aid of supportive devices such as crutches, cane, wheelchairs, walkers, the use of special transportation or the assistance of another person to leave their place of residence. There is a normal inability to leave the home and doing so requires considerable and taxing effort. Other absences are for medical reasons / religious services and are infrequent or of short duration when for other reasons). If current dressing causes regression in wound condition, may D/C ordered dressing product/s and apply Normal Saline Moist Dressing  daily until next Wound Healing Center / Other MD appointment. Notify Wound Healing Center of regression in wound condition at [phone]. Please direct any NON-WOUND related issues/requests for orders to patient's Primary Care Physician JAKOTA, MANTHEI ([id number]) Negative Pressure Wound Therapy: Wound #1 Left Achilles: Wound VAC settings at 125/130 mmHg continuous pressure. Use BLACK/GREEN foam to wound cavity. Use WHITE foam to fill any tunnel/s and/or undermining. Change VAC dressing 3 X WEEK. Change canister as indicated when full. Nurse may titrate settings and frequency of dressing changes as clinically indicated. Home Health Nurse may d/c VAC for s/s of increased infection, significant wound regression, or uncontrolled drainage. Notify Wound Healing Center at [phone]. Apply contact layer over base of wound. - Over bone Number of foam/gauze pieces used in the dressing = We will use Prisma on his toe, has lateral calcaneum and put a piece under the foam and nonadherent dressing off his Achilles tendon which is being dressed with a wound VAC. He will have home health changes dressing 3 times next week and will see me back on August 1. Electronic Signature(s) Signed: 08/12/2014 12:43:06 PM By: Evlyn Kanner MD, FACS Entered By: Evlyn Kanner on 08/12/2014 12:43:06 Rund, [name] ([id number]) -------------------------------------------------------------------------------- SuperBill Details Patient Name: Rumer, Henrick L. Date of Service: 08/12/2014 Medical Record Number: [id number] Patient Account Number: [id number] Date of Birth/Sex: Oct 22, 1920 (81 y.o. 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 M86.372 Chronic multifocal osteomyelitis, left ankle and foot Facility Procedures CPT4 Code Description: [id number] 11042 - DEB SUBQ TISSUE 20 SQ CM/< ICD-10 Description Diagnosis E11.621 Type 2 diabetes mellitus with foot ulcer M86.372 Chronic multifocal osteomyelitis, left ankle and foot L97.323 Non-pressure chronic ulcer of left  ankle with necrosis o Modifier: f muscle Quantity: 1  CPT4 Code Description: [id number] 11045 - DEB SUBQ TISS EA ADDL 20CM ICD-10 Description Diagnosis M86.372 Chronic multifocal osteomyelitis, left ankle and foot E11.621 Type 2 diabetes mellitus with foot ulcer E11.52 Type 2 diabetes mellitus with diabetic  peripheral angiop Modifier: athy with gang Quantity: 1 rene CPT4 Code Description: [id number] 11044 - DEB BONE 20 SQ CM/< ICD-10 Description Diagnosis M86.372 Chronic multifocal osteomyelitis, left ankle and foot E11.621 Type 2 diabetes mellitus with foot ulcer Modifier: Quantity: 1 Physician Procedures CPT4: Description Modifier Quantity Code [id number] 11042 - WC PHYS SUBQ TISS 20 SQ CM 1 Mealy, Kazi L. ([id number]) Electronic Signature(s) Signed: 08/12/2014 12:43:38 PM By: Evlyn Kanner MD, FACS Entered By: Evlyn Kanner on 08/12/2014 12:43:38

## 2014-08-22 ENCOUNTER — Encounter: Payer: Medicare Other | Attending: Surgery | Admitting: Surgery

## 2014-08-22 DIAGNOSIS — M86372 Chronic multifocal osteomyelitis, left ankle and foot: Secondary | ICD-10-CM | POA: Diagnosis not present

## 2014-08-22 DIAGNOSIS — E1151 Type 2 diabetes mellitus with diabetic peripheral angiopathy without gangrene: Secondary | ICD-10-CM | POA: Insufficient documentation

## 2014-08-22 DIAGNOSIS — L97421 Non-pressure chronic ulcer of left heel and midfoot limited to breakdown of skin: Secondary | ICD-10-CM | POA: Insufficient documentation

## 2014-08-22 DIAGNOSIS — I70244 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: Secondary | ICD-10-CM | POA: Insufficient documentation

## 2014-08-22 DIAGNOSIS — L97323 Non-pressure chronic ulcer of left ankle with necrosis of muscle: Secondary | ICD-10-CM | POA: Diagnosis not present

## 2014-08-22 DIAGNOSIS — E11621 Type 2 diabetes mellitus with foot ulcer: Secondary | ICD-10-CM | POA: Diagnosis not present

## 2014-08-22 DIAGNOSIS — L97521 Non-pressure chronic ulcer of other part of left foot limited to breakdown of skin: Secondary | ICD-10-CM | POA: Diagnosis not present

## 2014-08-22 NOTE — Progress Notes (Addendum)
GAVON, MAJANO ([id number]) Visit Report for 08/22/2014 Chief Complaint Document Details Patient Name: [name], [name] Date of Service: 08/22/2014 11:45 AM Medical Record Number: [id number] Patient Account Number: [id number] Date of Birth/Sex: 06/20/1920 (81 y.o. Male) Treating RN: Primary Care Physician: Lindwood Qua Other Clinician: Referring Physician: Lindwood Qua Treating Physician/Extender: Rudene Re in Treatment: 16 Information Obtained from: Patient Chief Complaint Patient presents to the wound care center for a consult due non healing wound 81 year old gentleman who comes with a history of having some ulcerated areas on his heels since February 2016 and then a large injury to his left posterior heel and ankle since about a month. Electronic Signature(s) Signed: 08/22/2014 12:27:52 PM By: Evlyn Kanner MD, FACS Entered By: Evlyn Kanner on 08/22/2014 12:27:52 Muldoon, Marvis Elbert Ewings ([id number]) -------------------------------------------------------------------------------- Debridement Details Patient Name: [name], [name] Date of Service: 08/22/2014 11:45 AM Medical Record Number: [id number] Patient Account Number: [id number] Date of Birth/Sex: [birth year] (81 y.o. Male) Treating RN: Primary Care Physician: Lindwood Qua Other Clinician: Referring Physician: Lindwood Qua Treating Physician/Extender: Rudene Re in Treatment: 16 Debridement Performed for Wound #1 Left Achilles Assessment: Performed By: Physician Tristan Schroeder., MD Debridement: Debridement Pre-procedure Yes Verification/Time Out Taken: Start Time: 12:15 Pain Control: Other : lidocaine 4% Level: Skin/Subcutaneous Tissue Total Area Debrided (L x 6.5 (cm) x 4.2 (cm) = 27.3 (cm) W): Tissue and other Viable, Non-Viable, Eschar, Exudate, Subcutaneous material debrided: Instrument: Curette Bleeding: Moderate Hemostasis Achieved: Silver Nitrate End Time: 12:21 Procedural Pain: 0 Post  Procedural Pain: 0 Response to Treatment: Procedure was tolerated well Post Debridement Measurements of Total Wound Length: (cm) 6.5 Width: (cm) 4.2 Depth: (cm) 0.3 Volume: (cm) 6.432 Electronic Signature(s) Signed: 08/22/2014 12:27:18 PM By: Evlyn Kanner MD, FACS Entered By: Evlyn Kanner on 08/22/2014 12:27:18 Mulcahey, Coltin Elbert Ewings ([id number]) -------------------------------------------------------------------------------- Debridement Details Patient Name: [name], [name] Date of Service: 08/22/2014 11:45 AM Medical Record Number: [id number] Patient Account Number: [id number] Date of Birth/Sex: 1920-01-30 (81 y.o. Male) Treating RN: Primary Care Physician: Lindwood Qua Other Clinician: Referring Physician: Lindwood Qua Treating Physician/Extender: Rudene Re in Treatment: 16 Debridement Performed for Wound #2 Left,Lateral Calcaneous Assessment: Performed By: Physician Tristan Schroeder., MD Debridement: Debridement Pre-procedure Yes Verification/Time Out Taken: Start Time: 12:15 Pain Control: Other : lidocaine 4% Level: Skin/Subcutaneous Tissue Total Area Debrided (L x 0.3 (cm) x 0.2 (cm) = 0.06 (cm) W): Tissue and other Viable, Non-Viable, Eschar, Exudate, Subcutaneous material debrided: Instrument: Curette Bleeding: Minimum Hemostasis Achieved: Pressure End Time: 12:21 Procedural Pain: 0 Post Procedural Pain: 0 Response to Treatment: Procedure was tolerated well Post Debridement Measurements of Total Wound Length: (cm) 0.4 Width: (cm) 0.2 Depth: (cm) 0.2 Volume: (cm) 0.013 Electronic Signature(s) Signed: 08/22/2014 12:27:27 PM By: Evlyn Kanner MD, FACS Entered By: Evlyn Kanner on 08/22/2014 12:27:27 Shreiner, Skippy Elbert Ewings ([id number]) -------------------------------------------------------------------------------- Debridement Details Patient Name: [name], [name] Date of Service: 08/22/2014 11:45 AM Medical Record Number: [id number] Patient Account  Number: [id number] Date of Birth/Sex: [birth year] (81 y.o. Male) Treating RN: Primary Care Physician: Lindwood Qua Other Clinician: Referring Physician: Lindwood Qua Treating Physician/Extender: Rudene Re in Treatment: 16 Debridement Performed for Wound #3 Left Toe Great Assessment: Performed By: Physician Tristan Schroeder., MD Debridement: Debridement Pre-procedure Yes Verification/Time Out Taken: Start Time: 12:15 Pain Control: Other : lidocaine 4% Level: Skin/Subcutaneous Tissue Total Area Debrided (L x 0.3 (cm) x 0.4 (cm) = 0.12 (cm) W): Tissue and other Viable, Non-Viable, Eschar, Exudate, Subcutaneous material debrided: Instrument: Curette Bleeding: Moderate Hemostasis Achieved: Pressure End Time:  12:21 Procedural Pain: 0 Post Procedural Pain: 0 Response to Treatment: Procedure was tolerated well Post Debridement Measurements of Total Wound Length: (cm) 0.4 Width: (cm) 0.5 Depth: (cm) 0.2 Volume: (cm) 0.031 Electronic Signature(s) Signed: 08/22/2014 12:27:39 PM By: Evlyn Kanner MD, FACS Entered By: Evlyn Kanner on 08/22/2014 12:27:39 Piper, Eliakim Elbert Ewings ([id number]) -------------------------------------------------------------------------------- HPI Details Patient Name: [name], [name] Date of Service: 08/22/2014 11:45 AM Medical Record Number: [id number] Patient Account Number: [id number] Date of Birth/Sex: Sep 03, 1920 (81 y.o. Male) Treating RN: Primary Care Physician: Lindwood Qua Other Clinician: Referring Physician: Lindwood Qua Treating Physician/Extender: Rudene Re in Treatment: 16 History of Present Illness HPI Description: 81 year old gentleman who was known to be a diabetic for many years has peripheral neuropathy recently went to his primary care doctor for a punctured wound on his left heel which was something he had noticed. The patient then was referred to a podiatrist who referred him to Dr. Wyn Quaker in the vascular surgery  department and I understand the procedure was done on 03/14/2014 with a left lower extremity vascular procedure and stenting was done. Details of this are not available at the present time. The patient has been applying Neosporin to his leg and has not had any wound care addressed so far. patient has also had a history of coronary artery disease in the past and hasn't had a CABG and a pacemaker placement in the remote past.Other details and notes are pending. the patient is not in pain lives alone and has some home health and other aides coming to help him with his daily chores and meals. reviewing the vascular notes I understand the procedure was done on 03/14/2014 and he was operated by Dr. Wyn Quaker. he had a catheter placement to the left peroneal artery and a aortogram and selective left lower extremity angiogram was done. He also had a percutaneous transluminal angioplasty of the left peroneal artery and the tibioperoneal trunk. The distal SFA and above-knee popliteal artery were also angioplastied. A subcutaneous stent placement to the distal superficial femoral artery was also done. 05/05/2014 -- the patient has not had any change in his health and after much consideration is decided that he does not want HBOT as he is claustrophobic and was unable to tolerate being in the chamber for 90 minutes. I have discussed with him that his most recent x-ray of the foot shows that he has the distal phalanx of the left great toe showing changes with osteomyelitis cannot be excluded at that site. He tells me that he cannot have an MRI because of his defibrillator and hence we will order a triple phase bone scan. I have also reviewed his culture report which shows several organisms and he has sensitivity to tetracycline and we have recommended he takes this for 14 days and this has been given to him on 05/02/2014 05/12/2014 the bone scan done on 05/10/2014 shows #1 findings are worrisome for osteomyelitis  involving the calcaneus of the left foot, #2 increase uptake localizing to the left second and third toe on all 3 phases. Cannot rule out osteomyelitis in this area. And #3 left foot cellulitis. 05/19/2014 -- reviewed several reports which we have received back on [facility]. #1 chest x-ray done on April 21 shows chronic changes in the left base no acute findings. #2 his CBC is within normal limits. #3 hemoglobin A1c was 8.5%. #4 EKG done on 26 April was within normal limits due to his electronic ventricular pacemaker. 05/26/2014 -- he has had his PICC  line placed and is taking vancomycin daily basis. He is to start hyperbaric oxygen therapy on this coming Monday. 06/09/2014 -- he saw Dr. Sampson Goon yesterday and besides his IV antibiotic I believe a oral antibiotic was CHARVEZ, VOORHIES. ([id number]) also given and this may be Cipro. Patient is feeling fine otherwise does not have any symptoms though his blood pressure this morning in the wound center has been 90/50. We will check his blood pressure in the supine position. 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 Complained of worsening fatigue yesterday after HBO. Feels better today. Has appointment with PCP this afternoon. He wishes to hold off on HBO until next week. No significant pain. No fever or chills. Stable drainage. 06/23/2014 Aleister has taken a break from HBO T and has been feeling a little better. He was seen by the nurse practitioner at his PCPs office and at that time his vitals were stable and they did get some EKG done which was within normal limits. There have sent out some lab work which we still have to receive reports. He is going to see his PCP back this afternoon and will be seeing Dr. Sampson Goon tomorrow for review. addendum: we have received labs from his PCPs office and note that his potassium is high at 5.4 and his BUN/creatinine is slightly raised. His blood sugar was 216. His  total protein and albumen were 5.9 and 3 and this was low. His HandH was 10.3 and 31.4 WBC was 8.2 and his platelets were 304. CRP was 29.6 which was high. Vancomycin trough was 19.1 which was normal TSH was 3.11 which was normal 06/30/2014 -- Eliberto Ivory feels weak today and he feels his stomach is acting up and his not feeling up to doing hyperbaric oxygen therapy. He would like to take a break today and tomorrow and resume on Monday. He is on vancomycin and Levaquin as per ID and he has an appointment to see the vascular surgeons in about 10 days' time. 07/07/2014 -- Eliberto Ivory has been feeling weak all along and he has decided that he is not going to continue with hyperbaric oxygen therapy and a longer period. He had finished 16 of his 40 treatments and will not take anymore. He has an appointment with ID tomorrow and also his vascular surgeons and I will have a conference with both of them regarding his further care. 07/14/2014 -- over the last week I have contacted Dr. Sampson Goon and Dr. Wyn Quaker and discussed his care. Dr. Wyn Quaker agrees that he will take him to the OR and debrided his wound and use a wound VAC. Dr. Sampson Goon has altered his anti-biotic regimen - vancomycin has been stopped and he is on oral levofloxacin. The patient's surgery is scheduled for July 7. 07/21/2014 -- he continues to feel unwell and has a lot of GI disturbances to probably his antibiotics. He also has developed a new blister on his left fourth toe and is concerned about his upcoming surgery which is scheduled for next Thursday. 08/04/2014 -- he was taken to the OR on 07/28/2014 and a debridement of the left heel was done of skin and soft tissue and the exposed calcaneum to remove all nonviable tissue. A wound VAC was then applied at the end of the procedure. 08/12/2014 -- he was seen by Dr. Sampson Goon today and he is going to continue one more week of Levaquin and then stop it and see him back in 3 weeks' time. I have  reviewed his notes from today. Electronic Signature(s) Signed: 08/22/2014 12:28:09 PM By: Evlyn Kanner MD, FACS Entered By: Evlyn Kanner on 08/22/2014 12:28:09 OCTAVIANO, MUKAI ([id number] Bores, [name] ([id number]) -------------------------------------------------------------------------------- Physical Exam Details Patient Name: [name], [name] Date of Service: 08/22/2014 11:45 AM Medical Record Number: [id number] Patient Account Number: [id number] Date of Birth/Sex: 07-Jan-1921 (81 y.o. Male) Treating RN: Primary Care Physician: Lindwood Qua Other Clinician: Referring Physician: Lindwood Qua Treating Physician/Extender: Rudene Re in Treatment: 16 Constitutional . Pulse regular. Respirations normal and unlabored. Afebrile. . Eyes Nonicteric. Reactive to light. Ears, Nose, Mouth, and Throat Lips, teeth, and gums WNL.Marland Kitchen Moist mucosa without lesions . Neck supple and nontender. No palpable supraclavicular or cervical adenopathy. Normal sized without goiter. Respiratory WNL. No retractions.. Cardiovascular Pedal Pulses WNL. No clubbing, cyanosis or edema. Lymphatic No adneopathy. No adenopathy. No adenopathy. Musculoskeletal Adexa without tenderness or enlargement.. Digits and nails w/o clubbing, cyanosis, infection, petechiae, ischemia, or inflammatory conditions.. Integumentary (Hair, Skin) No suspicious lesions. No crepitus or fluctuance. No peri-wound warmth or erythema. No masses.Marland Kitchen Psychiatric Judgement and insight Intact.. No evidence of depression, anxiety, or agitation.. Notes All the wounds have significant amount of dry skin surrounding it and the one on the lateral calcaneum had a bit of moisture which has caused maceration. The arch is distended and seems cleaner and there is some granulation over the bone and some areas required sharp debridement with a curette. Electronic Signature(s) Signed: 08/22/2014 12:29:13 PM By: Evlyn Kanner MD, FACS Entered  By: Evlyn Kanner on 08/22/2014 12:29:13 Raupp, [name] ([id number]) -------------------------------------------------------------------------------- Physician Orders Details Patient Name: [name], [name] Date of Service: 08/22/2014 11:45 AM Medical Record Number: [id number] Patient Account Number: [id number] Date of Birth/Sex: [birth year] (81 y.o. 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 o May Shower, gently pat wound dry prior to applying new dressing. o May shower with protection. Wound #2 Left,Lateral Calcaneous o Clean wound with Normal Saline. o May Shower, gently pat wound dry prior to applying new dressing. o May shower with protection. Wound #3 Left Toe Great o Clean wound with Normal Saline. o May Shower, gently pat wound dry prior to applying new dressing. o May shower with protection. Anesthetic Wound #1 Left Achilles o Topical Lidocaine 4% cream applied to wound bed prior to debridement Wound #2 Left,Lateral Calcaneous o Topical Lidocaine 4% cream applied to wound bed prior to debridement Wound #3 Left Toe Great o Topical Lidocaine 4% cream applied to wound bed prior to debridement Skin Barriers/Peri-Wound Care Wound #1 Left Achilles Winnie, Jaquel  L. ([id number]) o Skin Prep Wound #2 Left,Lateral Calcaneous o Skin Prep Primary Wound Dressing Wound #1 Left Achilles o Prisma Ag Wound #2 Left,Lateral Calcaneous o Prisma Ag Wound #3 Left Toe Great o Prisma Ag Secondary Dressing Wound #1 Left Achilles o Mepitel One Wound #2 Left,Lateral Calcaneous o Boardered Foam Dressing Wound #3 Left Toe Great o Gauze and Kerlix/Conform Dressing  Change Frequency Wound #1 Left Achilles o Change Dressing Monday, Wednesday, Friday Wound #2 Left,Lateral Calcaneous o Change Dressing Monday, Wednesday, Friday Wound #3 Left Toe Great o Change Dressing Monday, Wednesday, Friday Follow-up Appointments o Return Appointment in 1 week. Wound #1 Left Achilles o Return Appointment in 1 week. Wound #2 Left,Lateral Calcaneous o Return Appointment in 1 week. Wound #3 Left Toe Great o Return Appointment in 1 week. Home Health ACELIN, FERDIG ([id number]) Wound #1 Left Achilles o Continue Home Health Visits - [facility] - To change Wednesday, Friday. o Continue Home Health Visits - [facility] - To change Wednesday, Friday. o Home Health Nurse may visit PRN to address patientos wound care needs. o Home Health Nurse may visit PRN to address patientos wound care needs. o FACE TO FACE ENCOUNTER: MEDICARE and MEDICAID PATIENTS: I certify that this patient is under my care and that I had a face-to-face encounter that meets the physician face-to-face encounter requirements with this patient on this date. The encounter with the patient was in whole or in part for the following MEDICAL CONDITION: (primary reason for Home Healthcare) MEDICAL NECESSITY: I certify, that based on my findings, NURSING services are a medically necessary home health service. HOME BOUND STATUS: I certify that my clinical findings support that this patient is homebound (i.e., Due to illness or injury, pt requires aid of supportive devices such as  crutches, cane, wheelchairs, walkers, the use of special transportation or the assistance of another person to leave their place of residence. There is a normal inability to leave the home and doing so requires considerable and taxing effort. Other absences are for medical reasons / religious services and are infrequent or of short duration when for other reasons). o FACE TO FACE ENCOUNTER: MEDICARE and MEDICAID PATIENTS: I certify that this patient is under my care and that I had a face-to-face encounter that meets the physician face-to-face encounter requirements with this patient on this date. The encounter with the patient was in whole or in part for the following MEDICAL CONDITION: (primary reason for Home Healthcare) MEDICAL NECESSITY: I certify, that based on my findings, NURSING services are a medically necessary home health service. HOME BOUND STATUS: I certify that my clinical findings support that this patient is homebound (i.e., Due to illness or injury, pt requires aid of supportive devices such as crutches, cane, wheelchairs, walkers, the use of special transportation or the assistance of another person to leave their place of residence. There is a normal inability to leave the home and doing so requires considerable and taxing effort. Other absences are for medical reasons / religious services and are infrequent or of short duration when for other reasons). o If current dressing causes regression in wound condition, may D/C ordered dressing product/s and apply Normal Saline Moist Dressing daily until next Wound Healing Center / Other MD appointment. Notify Wound Healing Center of regression in wound condition at [phone]. o If current dressing causes regression in wound condition, may D/C ordered dressing product/s and apply Normal Saline Moist Dressing daily until next Wound Healing Center / Other MD appointment. Notify Wound Healing Center of regression in wound  condition at [phone]. o Please direct any NON-WOUND related issues/requests for orders to patient's Primary Care Physician o Please direct any NON-WOUND related issues/requests for orders to patient's Primary Care Physician Wound #2 Left,Lateral Calcaneous o Continue Home Health Visits - [facility] - To change Wednesday, Friday. o Continue Home Health Visits - [facility] - To change Wednesday, Friday.   o Home Health Nurse may visit PRN to address patientos wound care needs. o Home Health Nurse may visit PRN to address patientos wound care needs. o FACE TO FACE ENCOUNTER: MEDICARE and MEDICAID PATIENTS: I certify that this patient is under my care and that I had a face-to-face encounter that meets the physician face-to-face encounter requirements with this patient on this date. The encounter with the patient was in whole or in part for the following MEDICAL CONDITION: (primary reason for Home Healthcare) MEDICAL NECESSITY: I certify, that based on my findings, NURSING services are a medically HURSCHEL, PAYNTER. ([id number]) necessary home health service. HOME BOUND STATUS: I certify that my clinical findings support that this patient is homebound (i.e., Due to illness or injury, pt requires aid of supportive devices such as crutches, cane, wheelchairs, walkers, the use of special transportation or the assistance of another person to leave their place of residence. There is a normal inability to leave the home and doing so requires considerable and taxing effort. Other absences are for medical reasons / religious services and are infrequent or of short duration when for other reasons). o FACE TO FACE ENCOUNTER: MEDICARE and MEDICAID PATIENTS: I certify that this patient is under my care and that I had a face-to-face encounter that meets the physician face-to-face encounter requirements with this patient on this date. The encounter with the patient was in whole or in part for the  following MEDICAL CONDITION: (primary reason for Home Healthcare) MEDICAL NECESSITY: I certify, that based on my findings, NURSING services are a medically necessary home health service. HOME BOUND STATUS: I certify that my clinical findings support that this patient is homebound (i.e., Due to illness or injury, pt requires aid of supportive devices such as crutches, cane, wheelchairs, walkers, the use of special transportation or the assistance of another person to leave their place of residence. There is a normal inability to leave the home and doing so requires considerable and taxing effort. Other absences are for medical reasons / religious services and are infrequent or of short duration when for other reasons). o If current dressing causes regression in wound condition, may D/C ordered dressing product/s and apply Normal Saline Moist Dressing daily until next Wound Healing Center / Other MD appointment. Notify Wound Healing Center of regression in wound condition at [phone]. o If current dressing causes regression in wound condition, may D/C ordered dressing product/s and apply Normal Saline Moist Dressing daily until next Wound Healing Center / Other MD appointment. Notify Wound Healing Center of regression in wound condition at [phone]. o Please direct any NON-WOUND related issues/requests for orders to patient's Primary Care Physician o Please direct any NON-WOUND related issues/requests for orders to patient's Primary Care Physician Wound #3 Left Toe Great o Continue Home Health Visits - [facility] - To change Wednesday, Friday. o Continue Home Health Visits - [facility] - To change Wednesday, Friday. o Home Health Nurse may visit PRN to address patientos wound care needs. o Home Health Nurse may visit PRN to address patientos wound care needs. o FACE TO FACE ENCOUNTER: MEDICARE and MEDICAID PATIENTS: I certify that this patient is under my care and that I  had a face-to-face encounter that meets the physician face-to-face encounter requirements with this patient on this date. The encounter with the patient was in whole or in part for the following MEDICAL CONDITION: (primary reason for Home Healthcare) MEDICAL NECESSITY: I certify, that based on my findings, NURSING services are a medically necessary home  health service. HOME BOUND STATUS: I certify that my clinical findings support that this patient is homebound (i.e., Due to illness or injury, pt requires aid of supportive devices such as crutches, cane, wheelchairs, walkers, the use of special transportation or the assistance of another person to leave their place of residence. There is a normal inability to leave the home and doing so requires considerable and taxing effort. Other absences are for medical reasons / religious services and are infrequent or of short duration when for other reasons). o FACE TO FACE ENCOUNTER: MEDICARE and MEDICAID PATIENTS: I certify that this patient is under my care and that I had a face-to-face encounter that meets the physician face-to-face encounter requirements with this patient on this date. The encounter with the patient was in [place], JAYSHUN GALENTINE. ([id number]) whole or in part for the following MEDICAL CONDITION: (primary reason for Home Healthcare) MEDICAL NECESSITY: I certify, that based on my findings, NURSING services are a medically necessary home health service. HOME BOUND STATUS: I certify that my clinical findings support that this patient is homebound (i.e., Due to illness or injury, pt requires aid of supportive devices such as crutches, cane, wheelchairs, walkers, the use of special transportation or the assistance of another person to leave their place of residence. There is a normal inability to leave the home and doing so requires considerable and taxing effort. Other absences are for medical reasons / religious services and are infrequent or  of short duration when for other reasons). o If current dressing causes regression in wound condition, may D/C ordered dressing product/s and apply Normal Saline Moist Dressing daily until next Wound Healing Center / Other MD appointment. Notify Wound Healing Center of regression in wound condition at [phone]. o If current dressing causes regression in wound condition, may D/C ordered dressing product/s and apply Normal Saline Moist Dressing daily until next Wound Healing Center / Other MD appointment. Notify Wound Healing Center of regression in wound condition at [phone]. o Please direct any NON-WOUND related issues/requests for orders to patient's Primary Care Physician o Please direct any NON-WOUND related issues/requests for orders to patient's Primary Care Physician Negative Pressure Wound Therapy Wound #1 Left Achilles o Wound VAC settings at 125/130 mmHg continuous pressure. Use BLACK/GREEN foam to wound cavity. Use WHITE foam to fill any tunnel/s and/or undermining. Change VAC dressing 3 X WEEK. Change canister as indicated when full. Nurse may titrate settings and frequency of dressing changes as clinically indicated. o Home Health Nurse may d/c VAC for s/s of increased infection, significant wound regression, or uncontrolled drainage. Notify Wound Healing Center at [phone]. o Apply contact layer over base of wound. o Number of foam/gauze pieces used in the dressing = o Other: - Apply Prisma AG to wound bed and contact layer under foam. Electronic Signature(s) Signed: 08/22/2014 12:31:06 PM By: Evlyn Kanner MD, FACS Signed: 08/24/2014 4:58:31 PM By: Elliot Gurney, RN, BSN, Kim RN, BSN Entered By: Elliot Gurney, RN, BSN, Kim on 08/22/2014 12:20:27 SANUEL, LADNIER ([id number]) -------------------------------------------------------------------------------- Prescription 08/22/2014 Patient Name: Marsico, Eragon L. Physician: Evlyn Kanner MD Date of Birth:  12/09/1920 NPI#: [id number] Sex: Judie Petit DEA#: [id number] Phone #: [phone] License #: Patient Address: [facility] [address] [place], [place] [address] [facility] [address] [place], [place] [address] [phone] Allergies ACE Inhibitors Reaction: chest pain Severity: Severe Physician's Orders Wound VAC settings at 125/130 mmHg continuous pressure. Use BLACK/GREEN foam to wound cavity. Use WHITE foam  to fill any tunnel/s and/or undermining. Change VAC dressing 3 X WEEK. Change canister as indicated when full. Nurse may titrate settings and frequency of dressing changes as clinically indicated. Signature(s): Date(s): Psychologist, prison and probation services) Signed: 08/22/2014 12:31:06 PM By: Evlyn Kanner MD, FACS Signed: 08/24/2014 4:58:31 PM By: Elliot Gurney RN, BSN, Kim RN, BSN Entered By: Elliot Gurney, RN, BSN, Kim on 08/22/2014 12:20:27 Jacque, [name] ([id number]) --------------------------------------------------------------------------------  Problem List Details Patient Name: [name], [name] Date of Service: 08/22/2014 11:45 AM Medical Record Number: [id number] Patient Account Number: [id number] Date of Birth/Sex: 08/01/1920 (81 y.o. 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 Date of Service: 08/22/2014 11:45 AM Medical Record Number: [id number] Patient Account Number: [id number] Date of Birth/Sex: 21-Apr-1920 (81 y.o. Male) Treating RN: Primary Care Physician: Lindwood Qua Other Clinician: Referring Physician: Lindwood Qua Treating Physician/Extender: Rudene Re in Treatment: 16 Subjective Chief Complaint Information obtained from Patient Patient presents to the wound care center for a consult due non healing wound 81 year old gentleman who comes with a history of having some ulcerated areas on his heels since February 2016 and then a large injury to his left posterior heel and ankle since about a month. History of Present Illness (HPI) 81 year old gentleman who was known to be a diabetic for many years has peripheral neuropathy recently went to his primary care doctor for a punctured wound on his left heel which was something he had noticed. The patient then was referred to a podiatrist who referred him to Dr. Wyn Quaker in the vascular surgery department and I understand the procedure was done on 03/14/2014 with a left lower extremity vascular procedure and stenting was done. Details of this are not available at the present time. The patient has been applying Neosporin to his leg and has not had any wound care addressed so far. patient has also had a history of coronary artery disease in the past and hasn't had a CABG and a pacemaker placement in the remote past.Other details and notes are pending. the patient is not in pain lives alone and has some home health and other aides coming to help him with his daily chores and  meals. reviewing the vascular notes I understand the procedure was done on 03/14/2014 and he was operated by Dr. Wyn Quaker. he had a catheter placement to the left peroneal artery and a aortogram and selective left lower extremity angiogram was done. He also had a percutaneous transluminal angioplasty of the left peroneal artery and the tibioperoneal trunk. The distal SFA and above-knee popliteal artery were also angioplastied. A subcutaneous stent placement to the distal superficial femoral artery was also done. 05/05/2014 -- the patient has not had any change in his health and after much consideration is decided that he does not want HBOT as he is claustrophobic and was unable  to tolerate being in the chamber for 90 minutes. I have discussed with him that his most recent x-ray of the foot shows that he has the distal phalanx of the left great toe showing changes with osteomyelitis cannot be excluded at that site. He tells me that he cannot have an MRI because of his defibrillator and hence we will order a triple phase bone scan. I have also reviewed his culture report which shows several organisms and he has sensitivity to tetracycline and we have recommended he takes this for 14 days and this has been given to him on 05/02/2014 05/12/2014 the bone scan done on 05/10/2014 shows #1 findings are worrisome for osteomyelitis involving the calcaneus of the left foot, #2 increase uptake localizing to the left second and third toe on all 3 phases. Cannot rule out osteomyelitis in this area. And #3 left foot cellulitis. 05/19/2014 -- reviewed several reports which we have received back on Everrett Coombe. #1 chest x-ray done on April 21 shows chronic changes in the left base no acute findings. Staton, Vihaan L. ([id number]) #2 his CBC is within normal limits. #3 hemoglobin A1c was 8.5%. #4 EKG done on 26 April was within normal limits due to his electronic ventricular pacemaker. 05/26/2014 -- he has had his  PICC line placed and is taking vancomycin daily basis. He is to start hyperbaric oxygen therapy on this coming Monday. 06/09/2014 -- he saw Dr. Sampson Goon yesterday and besides his IV antibiotic I believe a oral antibiotic was also given and this may be Cipro. Patient is feeling fine otherwise does not have any symptoms though his blood pressure this morning in the wound center has been 90/50. We will check his blood pressure in the supine position. 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 Complained of worsening fatigue yesterday after HBO. Feels better today. Has appointment with PCP this afternoon. He wishes to hold off on HBO until next week. No significant pain. No fever or chills. Stable drainage. 06/23/2014 Makhi has taken a break from HBO T and has been feeling a little better. He was seen by the nurse practitioner at his PCPs office and at that time his vitals were stable and they did get some EKG done which was within normal limits. There have sent out some lab work which we still have to receive reports. He is going to see his PCP back this afternoon and will be seeing Dr. Sampson Goon tomorrow for review. addendum: we have received labs from his PCPs office and note that his potassium is high at 5.4 and his BUN/creatinine is slightly raised. His blood sugar was 216. His total protein and albumen were 5.9 and 3 and this was low. His HandH was 10.3 and 31.4 WBC was 8.2 and his platelets were 304. CRP was 29.6 which was high. Vancomycin trough was 19.1 which was normal TSH was 3.11 which was normal 06/30/2014 -- Eliberto Ivory feels weak today and he feels his stomach is acting up and his not feeling up to doing hyperbaric oxygen therapy. He would like to take a break today and tomorrow and resume on Monday. He is on vancomycin and Levaquin as per ID and he has an appointment to see the vascular surgeons in about 10 days' time. 07/07/2014 -- Eliberto Ivory has  been feeling weak all along and he has decided that he is not going to continue with hyperbaric oxygen therapy and a longer period. He had finished 16 of his 40 treatments  and will not take anymore. He has an appointment with ID tomorrow and also his vascular surgeons and I will have a conference with both of them regarding his further care. 07/14/2014 -- over the last week I have contacted Dr. Sampson Goon and Dr. Wyn Quaker and discussed his care. Dr. Wyn Quaker agrees that he will take him to the OR and debrided his wound and use a wound VAC. Dr. Sampson Goon has altered his anti-biotic regimen - vancomycin has been stopped and he is on oral levofloxacin. The patient's surgery is scheduled for July 7. 07/21/2014 -- he continues to feel unwell and has a lot of GI disturbances to probably his antibiotics. He also has developed a new blister on his left fourth toe and is concerned about his upcoming surgery which is scheduled for next Thursday. 08/04/2014 -- he was taken to the OR on 07/28/2014 and a debridement of the left heel was done of skin and soft tissue and the exposed calcaneum to remove all nonviable tissue. A wound VAC was then applied at the end of the procedure. KALLON, CAYLOR ([id number]) 08/12/2014 -- he was seen by Dr. Sampson Goon today and he is going to continue one more week of Levaquin and then stop it and see him back in 3 weeks' time. I have reviewed his notes from today. Objective Constitutional Pulse regular. Respirations normal and unlabored. Afebrile. Vitals Time Taken: 11:51 AM, Height: 70 in, Weight: 200 lbs, BMI: 28.7, Pulse: 88 bpm, Respiratory Rate: 20 breaths/min, Blood Pressure: 121/51 mmHg. Eyes Nonicteric. Reactive to light. Ears, Nose, Mouth, and Throat Lips, teeth, and gums WNL.Marland Kitchen Moist mucosa without lesions . Neck supple and nontender. No palpable supraclavicular or cervical adenopathy. Normal sized without goiter. Respiratory WNL. No  retractions.. Cardiovascular Pedal Pulses WNL. No clubbing, cyanosis or edema. Lymphatic No adneopathy. No adenopathy. No adenopathy. Musculoskeletal Adexa without tenderness or enlargement.. Digits and nails w/o clubbing, cyanosis, infection, petechiae, ischemia, or inflammatory conditions.Marland Kitchen Psychiatric Judgement and insight Intact.. No evidence of depression, anxiety, or agitation.. General Notes: All the wounds have significant amount of dry skin surrounding it and the one on the lateral calcaneum had a bit of moisture which has caused maceration. The arch is distended and seems cleaner and there is some granulation over the bone and some areas required sharp debridement with a curette. Integumentary (Hair, Skin) No suspicious lesions. No crepitus or fluctuance. No peri-wound warmth or erythema. No masses.Marland Kitchen [name], [name] ([id number]) Wound #1 status is Open. Original cause of wound was Gradually Appeared. The wound is located on the Left Achilles. The wound measures 6.5cm length x 4.2cm width x 0.3cm depth; 21.441cm^2 area and 6.432cm^3 volume. The wound is limited to skin breakdown. There is a large amount of serosanguineous drainage noted. The wound margin is distinct with the outline attached to the wound base. There is medium (34-66%) pink granulation within the wound bed. There is a small (1-33%) amount of necrotic tissue within the wound bed including Adherent Slough. The periwound skin appearance exhibited: Maceration, Moist. The periwound skin appearance did not exhibit: Callus, Crepitus, Excoriation, Fluctuance, Friable, Induration, Localized Edema, Rash, Scarring, Dry/Scaly, Atrophie Blanche, Cyanosis, Ecchymosis, Hemosiderin Staining, Mottled, Pallor, Rubor, Erythema. Wound #2 status is Open. Original cause of wound was Gradually Appeared. The wound is located on the Left,Lateral Calcaneous. The wound measures 0.3cm length x 0.2cm width x 0.2cm depth; 0.047cm^2 area and  0.009cm^3 volume. The wound is limited to skin breakdown. There is a none present amount of drainage noted. There is large (  67-100%) pale granulation within the wound bed. There is a small (1-33%) amount of necrotic tissue within the wound bed including Adherent Slough. The periwound skin appearance exhibited: Maceration. The periwound skin appearance did not exhibit: Callus, Crepitus, Excoriation, Fluctuance, Friable, Induration, Localized Edema, Rash, Scarring, Dry/Scaly, Moist, Atrophie Blanche, Cyanosis, Ecchymosis, Hemosiderin Staining, Mottled, Pallor, Rubor, Erythema. Wound #3 status is Open. Original cause of wound was Gradually Appeared. The wound is located on the Left Toe Great. The wound measures 0.3cm length x 0.4cm width x 0.2cm depth; 0.094cm^2 area and 0.019cm^3 volume. The wound is limited to skin breakdown. The wound margin is flat and intact. There is small (1-33%) pale granulation within the wound bed. There is a large (67-100%) amount of necrotic tissue within the wound bed including Adherent Slough. The periwound skin appearance exhibited: Callus, Dry/Scaly, Moist. 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 I have discussed with them in the wound gets slightly more granulated we can try a skin substitute over this. He is going to see Dr. Wyn Quaker and Dr. Sampson Goon over the next 2 weeks. Henandez, [name] ([id number]) He will come back and see me next  Monday. 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 There was a Skin/Subcutaneous Tissue Debridement (40981-19147) debridement with total area of 27.3 sq cm performed by Seabron Iannello, Ignacia Felling., MD. with the following instrument(s): Curette to remove Viable and Non-Viable tissue/material including Exudate, Eschar, and Subcutaneous after achieving pain control using Other (lidocaine 4%). A time out was conducted prior to the start of the procedure. A Moderate amount of bleeding was controlled with Silver Nitrate. The procedure was tolerated well with a pain level of 0 throughout and a pain level of 0 following the procedure. Post Debridement Measurements: 6.5cm length x 4.2cm width x 0.3cm depth; 6.432cm^3 volume. Wound #2 Wound #2 is an Arterial Insufficiency Ulcer located on the Left,Lateral Calcaneous . There was a Skin/Subcutaneous Tissue Debridement ([id number]) debridement with total area of 0.06 sq cm performed by Renner Sebald, Ignacia Felling., MD. with the following instrument(s): Curette to remove Viable and Non-Viable tissue/material including Exudate, Eschar, and Subcutaneous after achieving pain control using Other (lidocaine 4%). A time out was conducted prior to the start of the procedure. A Minimum amount of bleeding was controlled with Pressure. The procedure was tolerated well with a pain level of 0 throughout and a pain level of 0 following the procedure. Post Debridement Measurements: 0.4cm length x 0.2cm width x 0.2cm depth; 0.013cm^3 volume. Wound #3 Wound #3 is an Arterial Insufficiency Ulcer located on the Left Toe Great . There was a Skin/Subcutaneous Tissue Debridement ([id number]) debridement with total area of 0.12 sq cm performed by Issabela Lesko, Ignacia Felling., MD. with the following instrument(s): Curette to remove Viable and Non-Viable tissue/material including Exudate, Eschar, and Subcutaneous after achieving pain control using Other  (lidocaine 4%). A time out was conducted prior to the start of the procedure. A Moderate amount of bleeding was controlled with  Pressure. The procedure was tolerated well with a pain level of 0 throughout and a pain level of 0 following the procedure. Post Debridement Measurements: 0.4cm length x 0.5cm width x 0.2cm depth; 0.031cm^3 volume. Plan Wound Cleansing: Wound #1 Left Achilles: Clean wound with Normal Saline. May Shower, gently pat wound dry prior to applying new dressing. May shower with protection. Wound #2 Left,Lateral Calcaneous: Clean wound with Normal Saline. May Shower, gently pat wound dry prior to applying new dressing. May shower with protection. Higinbotham, Kenzo L. ([id number]) Wound #3 Left Toe Great: Clean wound with Normal Saline. May Shower, gently pat wound dry prior to applying new dressing. May shower with protection. Anesthetic: Wound #1 Left Achilles: Topical Lidocaine 4% cream applied to wound bed prior to debridement Wound #2 Left,Lateral Calcaneous: Topical Lidocaine 4% cream applied to wound bed prior to debridement Wound #3 Left Toe Great: Topical Lidocaine 4% cream applied to wound bed prior to debridement Skin Barriers/Peri-Wound Care: Wound #1 Left Achilles: Skin Prep Wound #2 Left,Lateral Calcaneous: Skin Prep Primary Wound Dressing: Wound #1 Left Achilles: Prisma Ag Wound #2 Left,Lateral Calcaneous: Prisma Ag Wound #3 Left Toe Great: Prisma Ag Secondary Dressing: Wound #1 Left Achilles: Mepitel One Wound #2 Left,Lateral Calcaneous: Boardered Foam Dressing Wound #3 Left Toe Great: Gauze and Kerlix/Conform Dressing Change Frequency: Wound #1 Left Achilles: Change Dressing Monday, Wednesday, Friday Wound #2 Left,Lateral Calcaneous: Change Dressing Monday, Wednesday, Friday Wound #3 Left Toe Great: Change Dressing Monday, Wednesday, Friday Follow-up Appointments: Return Appointment in 1 week. Wound #1 Left Achilles: Return  Appointment in 1 week. Wound #2 Left,Lateral Calcaneous: Return Appointment in 1 week. Wound #3 Left Toe Great: Return Appointment in 1 week. Home Health: Wound #1 Left Achilles: Continue Home Health Visits - [facility] - To change Wednesday, Friday. Continue Home Health Visits - [facility] - To change Wednesday, Friday. Home Health Nurse may visit PRN to address patient s wound care needs. Home Health Nurse may visit PRN to address patient s wound care needs. JEISON, DELPILAR ([id number]) FACE TO FACE ENCOUNTER: MEDICARE and MEDICAID PATIENTS: I certify that this patient is under my care and that I had a face-to-face encounter that meets the physician face-to-face encounter requirements with this patient on this date. The encounter with the patient was in whole or in part for the following MEDICAL CONDITION: (primary reason for Home Healthcare) MEDICAL NECESSITY: I certify, that based on my findings, NURSING services are a medically necessary home health service. HOME BOUND STATUS: I certify that my clinical findings support that this patient is homebound (i.e., Due to illness or injury, pt requires aid of supportive devices such as crutches, cane, wheelchairs, walkers, the use of special transportation or the assistance of another person to leave their place of residence. There is a normal inability to leave the home and doing so requires considerable and taxing effort. Other absences are for medical reasons / religious services and are infrequent or of short duration when for other reasons). FACE TO FACE ENCOUNTER: MEDICARE and MEDICAID PATIENTS: I certify that this patient is under my care and that I had a face-to-face encounter that meets the physician face-to-face encounter requirements with this patient on this date. The encounter with the patient was in whole or in part for the following MEDICAL CONDITION: (primary reason for Home Healthcare) MEDICAL NECESSITY: I certify, that based on my  findings, NURSING services are a medically necessary home health service. HOME BOUND STATUS: I certify that my clinical findings support that this patient  is homebound (i.e., Due to illness or injury, pt requires aid of supportive devices such as crutches, cane, wheelchairs, walkers, the use of special transportation or the assistance of another person to leave their place of residence. There is a normal inability to leave the home and doing so requires considerable and taxing effort. Other absences are for medical reasons / religious services and are infrequent or of short duration when for other reasons). If current dressing causes regression in wound condition, may D/C ordered dressing product/s and apply Normal Saline Moist Dressing daily until next Wound Healing Center / Other MD appointment. Notify Wound Healing Center of regression in wound condition at [phone]. If current dressing causes regression in wound condition, may D/C ordered dressing product/s and apply Normal Saline Moist Dressing daily until next Wound Healing Center / Other MD appointment. Notify Wound Healing Center of regression in wound condition at [phone]. Please direct any NON-WOUND related issues/requests for orders to patient's Primary Care Physician Please direct any NON-WOUND related issues/requests for orders to patient's Primary Care Physician Wound #2 Left,Lateral Calcaneous: Continue Home Health Visits - [facility] - To change Wednesday, Friday. Continue Home Health Visits - [facility] - To change Wednesday, Friday. Home Health Nurse may visit PRN to address patient s wound care needs. Home Health Nurse may visit [facility] to address patient s wound care needs. FACE TO FACE ENCOUNTER: MEDICARE and MEDICAID PATIENTS: I certify that this patient is under my care and that I had a face-to-face encounter that meets the physician face-to-face encounter requirements with this patient on this date. The encounter with the  patient was in whole or in part for the following MEDICAL CONDITION: (primary reason for Home Healthcare) MEDICAL NECESSITY: I certify, that based on my findings, NURSING services are a medically necessary home health service. HOME BOUND STATUS: I certify that my clinical findings support that this patient is homebound (i.e., Due to illness or injury, pt requires aid of supportive devices such as crutches, cane, wheelchairs, walkers, the use of special transportation or the assistance of another person to leave their place of residence. There is a normal inability to leave the home and doing so requires considerable and taxing effort. Other absences are for medical reasons / religious services and are infrequent or of short duration when for other reasons). FACE TO FACE ENCOUNTER: MEDICARE and MEDICAID PATIENTS: I certify that this patient is under my care and that I had a face-to-face encounter that meets the physician face-to-face encounter requirements with this patient on this date. The encounter with the patient was in whole or in part for the following MEDICAL CONDITION: (primary reason for Home Healthcare) MEDICAL NECESSITY: I certify, that based on my findings, NURSING services are a medically necessary home health service. HOME BOUND STATUS: I certify that my clinical findings support that this patient is homebound (i.e., Due to illness or injury, pt requires aid of supportive devices such as crutches, cane, wheelchairs, walkers, the use of special transportation or the assistance of another person to leave their place of residence. There is a [name], [name] ([id number]) normal inability to leave the home and doing so requires considerable and taxing effort. Other absences are for medical reasons / religious services and are infrequent or of short duration when for other reasons). If current dressing causes regression in wound condition, may D/C ordered dressing product/s and  apply Normal Saline Moist Dressing daily until next Wound Healing Center / Other MD appointment. Notify Wound Healing Center of regression in  wound condition at [phone]. If current dressing causes regression in wound condition, may D/C ordered dressing product/s and apply Normal Saline Moist Dressing daily until next Wound Healing Center / Other MD appointment. Notify Wound Healing Center of regression in wound condition at [phone]. Please direct any NON-WOUND related issues/requests for orders to patient's Primary Care Physician Please direct any NON-WOUND related issues/requests for orders to patient's Primary Care Physician Wound #3 Left Toe Great: Continue Home Health Visits - [facility] - To change Wednesday, Friday. Continue Home Health Visits - [facility] - To change Wednesday, Friday. Home Health Nurse may visit [facility]N to address patient s wound care needs. Home Health Nurse may visit [facility] to address patient s wound care needs. FACE TO FACE ENCOUNTER: MEDICARE and MEDICAID PATIENTS: I certify that this patient is under my care and that I had a face-to-face encounter that meets the physician face-to-face encounter requirements with this patient on this date. The encounter with the patient was in whole or in part for the following MEDICAL CONDITION: (primary reason for Home Healthcare) MEDICAL NECESSITY: I certify, that based on my findings, NURSING services are a medically necessary home health service. HOME BOUND STATUS: I certify that my clinical findings support that this patient is homebound (i.e., Due to illness or injury, pt requires aid of supportive devices such as crutches, cane, wheelchairs, walkers, the use of special transportation or the assistance of another person to leave their place of residence. There is a normal inability to leave the home and doing so requires considerable and taxing effort. Other absences are for medical reasons / religious services and are  infrequent or of short duration when for other reasons). FACE TO FACE ENCOUNTER: MEDICARE and MEDICAID PATIENTS: I certify that this patient is under my care and that I had a face-to-face encounter that meets the physician face-to-face encounter requirements with this patient on this date. The encounter with the patient was in whole or in part for the following MEDICAL CONDITION: (primary reason for Home Healthcare) MEDICAL NECESSITY: I certify, that based on my findings, NURSING services are a medically necessary home health service. HOME BOUND STATUS: I certify that my clinical findings support that this patient is homebound (i.e., Due to illness or injury, pt requires aid of supportive devices such as crutches, cane, wheelchairs, walkers, the use of special transportation or the assistance of another person to leave their place of residence. There is a normal inability to leave the home and doing so requires considerable and taxing effort. Other absences are for medical reasons / religious services and are infrequent or of short duration when for other reasons). If current dressing causes regression in wound condition, may D/C ordered dressing product/s and apply Normal Saline Moist Dressing daily until next Wound Healing Center / Other MD appointment. Notify Wound Healing Center of regression in wound condition at [phone]. If current dressing causes regression in wound condition, may D/C ordered dressing product/s and apply Normal Saline Moist Dressing daily until next Wound Healing Center / Other MD appointment. Notify Wound Healing Center of regression in wound condition at [phone]. Please direct any NON-WOUND related issues/requests for orders to patient's Primary Care Physician Please direct any NON-WOUND related issues/requests for orders to patient's Primary Care Physician Negative Pressure Wound Therapy: Wound #1 Left Achilles: Wound VAC settings at 125/130 mmHg continuous  pressure. Use BLACK/GREEN foam to wound cavity. Use WHITE foam to fill any tunnel/s and/or undermining. Change VAC dressing 3 X WEEK. Change canister as indicated when full.  Nurse may titrate settings and frequency of dressing changes as clinically indicated. Home Health Nurse may d/c VAC for s/s of increased infection, significant wound regression, or uncontrolled drainage. Notify Wound Healing Center at [phone]. Min, Kojo L. ([id number]) Apply contact layer over base of wound. Number of foam/gauze pieces used in the dressing = Other: - Apply Prisma AG to wound bed and contact layer under foam. We will use Prisma over all the wounds including the one on the Achilles tendon which would have a layer of Prisma nonadherent contact layer and then the form of the wound VAC. I have discussed with them in the wound gets slightly more granulated we can try a skin substitute over this. He is going to see Dr. Wyn Quaker and Dr. Sampson Goon over the next 2 weeks. He will come back and see me next Monday. Electronic Signature(s) Signed: 08/22/2014 12:30:29 PM By: Evlyn Kanner MD, FACS Entered By: Evlyn Kanner on 08/22/2014 12:30:29 Joost, [name] ([id number]) -------------------------------------------------------------------------------- SuperBill Details Patient Name: Kall, Nyree L. Date of Service: 08/22/2014 Medical Record Number: [id number] Patient Account Number: [id number] Date of Birth/Sex: 09/27/1920 (81 y.o. 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372 Chronic multifocal osteomyelitis, left  ankle and foot Modifier: opathy with gang Quantity: 1 rene Physician Procedures CPT4 Code Description: 4332951 11042 - WC PHYS SUBQ TISS 20 SQ CM ICD-10 Description Diagnosis E11.52 Type 2 diabetes mellitus with diabetic peripheral angio M86.372 Chronic multifocal osteomyelitis, left ankle and foot E11.621 Type 2 diabetes mellitus  with foot ulcer Modifier: pathy with gang Quantity: 1 rene CPT4 Code Description: [id number] 11045 - WC PHYS SUBQ TISS EA ADDL 20 CM King, Phuong L. ([id number]) Modifier: Quantity: 1 Electronic Signature(s) Signed: 08/22/2014 12:30:50 PM By: Evlyn Kanner MD, FACS Entered By: Evlyn Kanner on 08/22/2014 12:30:50

## 2014-08-25 NOTE — Progress Notes (Addendum)
[name] ([id number]) Visit Report for 08/22/2014 Arrival Information Details Patient Name: [name], [name]. Date of Service: 08/22/2014 11:45 AM Medical Record Number: [id number] Patient Account Number: [id number] Date of Birth/Sex: 1920/11/24 (81 y.o. Male) Treating RN: Huel Coventry Primary Care Physician: Lindwood Qua Other Clinician: Referring Physician: Lindwood Qua Treating Physician/Extender: Rudene Re in Treatment: 16 Visit Information History Since Last Visit Added or deleted any medications: Yes Patient Arrived: Wheel Chair Any new allergies or adverse reactions: No Arrival Time: 11:49 Had a fall or experienced change in No Accompanied By: son activities of daily living that may affect Transfer Assistance: Manual risk of falls: Patient Identification Verified: Yes Signs or symptoms of abuse/neglect since last No Secondary Verification Process Yes visito Completed: Has Dressing in Place as Prescribed: Yes Patient Requires Transmission-Based No Pain Present Now: No Precautions: Patient Has Alerts: Yes Patient Alerts: 04/27/13 ABI (L) 0.91 (R) 0.71 Electronic Signature(s) Signed: 08/29/2014 5:20:17 PM By: Elliot Gurney, RN, BSN, Kim RN, BSN Previous Signature: 08/24/2014 4:58:31 PM Version By: Elliot Gurney, RN, BSN, Kim RN, BSN Entered By: Elliot Gurney, RN, BSN, Kim on 08/29/2014 08:09:33 [name] ([id number]) -------------------------------------------------------------------------------- Lower Extremity Assessment Details Patient Name: [name], [name] Date of Service: 08/22/2014 11:45 AM Medical Record Number: [id number] Patient Account Number: [id number] Date of Birth/Sex: 03/20/1920 (81 y.o. Male) Treating RN: Huel Coventry Primary Care Physician: Lindwood Qua Other Clinician: Referring Physician: Lindwood Qua Treating Physician/Extender: Rudene Re in Treatment: 16 Vascular Assessment Pulses: Posterior Tibial Palpable: [Left:Yes] Doppler:  [Left:Monophasic] Dorsalis Pedis Palpable: [Left:No] Doppler: [Left:Inaudible] Extremity colors, hair growth, and conditions: Extremity Color: [Left:Pale] Hair Growth on Extremity: [Left:No] Temperature of Extremity: [Left:Warm] Capillary Refill: [Left:< 3 seconds] Toe Nail Assessment Left: Right: Thick: Yes Discolored: Yes Deformed: Yes Improper Length and Hygiene: Yes Electronic Signature(s) Signed: 08/24/2014 4:58:31 PM By: Elliot Gurney, RN, BSN, Kim RN, BSN Entered By: Elliot Gurney, RN, BSN, Kim on 08/22/2014 12:00:13 [name] ([id number]) -------------------------------------------------------------------------------- Multi Wound Chart Details Patient Name: [name], [name] Date of Service: 08/22/2014 11:45 AM Medical Record Number: [id number] Patient Account Number: [id number] Date of Birth/Sex: 1920-12-31 (81 y.o. 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] Comorbid History: [1:Cataracts, Chronic sinus Cataracts, Chronic sinus Cataracts, Chronic sinus problems/congestion, Congestive Heart Failure, Congestive Heart Failure, Congestive Heart Failure, Coronary Artery Disease, Coronary Artery Disease, Coronary  Artery Disease, Hypertension, Myocardial Hypertension, Myocardial Hypertension, Myocardial Infarction, Peripheral Arterial Disease, Type II Arterial  Disease, Type II Arterial Disease, Type II Diabetes, Neuropathy] [2:problems/congestion, Infarction,  Peripheral Diabetes, Neuropathy] [3:problems/congestion, Infarction, Peripheral Diabetes, Neuropathy] Date Acquired: [1:01/24/2014] [2:01/24/2014] [3:01/24/2014] Weeks of Treatment: [1:16] [2:16] [3:16] Wound Status: [1:Open] [2:Open] [3:Open] Clustered Wound: [1:No] [2:Yes] [3:No] Pending Amputation on Yes [2:No] [3:No] Presentation: Measurements L x W x D 6.5x4.2x0.3 [2:0.3x0.2x0.2] [3:0.3x0.4x0.2] (cm) Area (cm) : [1:21.441] [2:0.047] [3:0.094] Volume (cm) : [1:6.432] [2:0.009] [3:0.019] % Reduction in Area: [1:13.30%] [2:99.20%] [3:89.10%] % Reduction in Volume: 13.30% [2:98.40%] [3:77.90%] Classification: [1:Full Thickness With Exposed Support Structures] [2:Full Thickness Without Exposed Support Structures] [3:Full Thickness Without Exposed Support Structures] HBO Classification: [1:Grade 2] [2:Grade 2] [3:Grade 1] Exudate Amount: [  1:Large] [2:None Present] [3:N/A] Exudate Type: [1:Serosanguineous] [2:N/A] [3:N/A] Exudate Color: red, brown N/A N/A Wound Margin: Distinct, outline attached N/A Flat and Intact Granulation Amount: Medium (34-66%) Large (67-100%) Small (1-33%) Granulation Quality: Pink Pale Pale Necrotic Amount: Small (1-33%) Small (1-33%) Large (67-100%) Exposed Structures: Fascia: No Fascia: No Fascia: No Fat: No Fat: No Fat: No Tendon: No Tendon: No Tendon: No Muscle: No Muscle: No Muscle: No Joint: No Joint: No Joint: No Bone: No Bone: No Bone: No Limited to Skin Limited to Skin Limited to Skin Breakdown Breakdown Breakdown Epithelialization: Medium (34-66%) N/A Small (1-33%) Periwound Skin Texture: Edema: No Edema: No Callus: Yes Excoriation: No Excoriation: No Induration: No Induration: No Callus: No Callus: No Crepitus: No Crepitus: No Fluctuance: No Fluctuance: No Friable: No Friable: No Rash: No Rash: No Scarring: No Scarring:  No Periwound Skin Maceration: Yes Maceration: Yes Moist: Yes Moisture: Moist: Yes Moist: No Dry/Scaly: Yes Dry/Scaly: No Dry/Scaly: No Periwound Skin Color: Atrophie Blanche: No Atrophie Blanche: No No Abnormalities Noted Cyanosis: No Cyanosis: No Ecchymosis: No Ecchymosis: No Erythema: No Erythema: No Hemosiderin Staining: No Hemosiderin Staining: No Mottled: No Mottled: No Pallor: No Pallor: No Rubor: No Rubor: No Tenderness on No No No Palpation: Wound Preparation: Ulcer Cleansing: Ulcer Cleansing: Ulcer Cleansing: Rinsed/Irrigated with Rinsed/Irrigated with Rinsed/Irrigated with Saline Saline Saline Topical Anesthetic Topical Anesthetic Topical Anesthetic Applied: Other: lidocaine Applied: Other: lidociane Applied: Other: lidociane 4% 4% 4% Treatment Notes Electronic Signature(s) Signed: 08/24/2014 4:58:31 PM By: Elliot Gurney, RN, BSN, Kim RN, BSN Entered By: Elliot Gurney, RN, BSN, Kim on 08/22/2014 12:08:29 MARV, ALFREY ([id number]) KC, SUMMERSON ([id number]) -------------------------------------------------------------------------------- Multi-Disciplinary Care Plan Details Patient Name: [name], [name] Date of Service: 08/22/2014 11:45 AM Medical Record Number: [id number] Patient Account Number: [id number] Date of Birth/Sex: 02-15-1920 (81 y.o. Male) Treating RN: Huel Coventry Primary Care Physician: Lindwood Qua Other Clinician: Referring Physician: Lindwood Qua Treating Physician/Extender: Rudene Re in Treatment: 16 Active Inactive Abuse / Safety / Falls / Self Care Management Nursing Diagnoses: Potential for falls Goals: Patient will remain injury free Date Initiated: 04/28/2014 Goal Status: Active Interventions: Assess fall risk on admission and as needed Notes: Necrotic Tissue Nursing Diagnoses: Impaired tissue integrity related to necrotic/devitalized tissue Goals: Necrotic/devitalized tissue will be minimized in the wound bed Date  Initiated: 04/28/2014 Goal Status: Active Interventions: Provide education on necrotic tissue and debridement process Treatment Activities: Apply topical anesthetic as ordered : 08/22/2014 Notes: Nutrition Nursing Diagnoses: Potential for alteratiion in Nutrition/Potential for imbalanced nutrition Spainhour, Donatello L. ([id number]) Goals: Patient/caregiver agrees to and verbalizes understanding of need to use nutritional supplements and/or vitamins as prescribed Date Initiated: 04/28/2014 Goal Status: Active Interventions: Assess patient nutrition upon admission and as needed per policy Notes: Orientation to the Wound Care Program Nursing Diagnoses: Knowledge deficit related to the wound healing center program Goals: Patient/caregiver will verbalize understanding of the Wound Healing Center Program Date Initiated: 04/28/2014 Goal Status: Active Interventions: Provide education on orientation to the wound center Notes: Wound/Skin Impairment Nursing Diagnoses: Impaired tissue integrity Goals: Ulcer/skin breakdown will have a volume reduction of 30% by week 4 Date Initiated: 04/28/2014 Goal Status: Active Interventions: Assess ulceration(s) every visit Notes: Electronic Signature(s) Signed: 08/24/2014 4:58:31 PM By: Elliot Gurney, RN, BSN, Kim RN, BSN Entered By: Elliot Gurney, RN, BSN, Kim on 08/22/2014 12:08:22 Lodes, [name] ([id number]) -------------------------------------------------------------------------------- Pain Assessment Details Patient Name: [name], [name] Date of Service: 08/22/2014 11:45 AM Medical Record Number: [id number] Patient Account Number: [id number] Date of Birth/Sex: 1920/11/29 (81 y.o. Male) Treating RN:  Huel Coventry Primary Care Physician: Lindwood Qua Other Clinician: Referring Physician: Lindwood Qua Treating Physician/Extender: Rudene Re in Treatment: 16 Active Problems Location of Pain Severity and Description of Pain Patient Has Paino No Site  Locations Pain Management and Medication Current Pain Management: Electronic Signature(s) Signed: 08/24/2014 4:58:31 PM By: Elliot Gurney, RN, BSN, Kim RN, BSN Entered By: Elliot Gurney, RN, BSN, Kim on 08/22/2014 11:51:21 Tolen, [name] ([id number]) -------------------------------------------------------------------------------- Wound Assessment Details Patient Name: [name], [name] Date of Service: 08/22/2014 11:45 AM Medical Record Number: [id number] Patient Account Number: [id number] Date of Birth/Sex: [birth year] (81 y.o. Male) Treating RN: Huel Coventry Primary Care Physician: Lindwood Qua Other Clinician: Referring Physician: Lindwood Qua Treating Physician/Extender: Rudene Re in Treatment: 16 Wound Status Wound Number: 1 Primary Arterial Insufficiency Ulcer Etiology: Wound Location: Left Achilles Wound Open Wounding Event: Gradually Appeared Status: Date Acquired: 01/24/2014 Comorbid Cataracts, Chronic sinus Weeks Of Treatment: 16 History: problems/congestion, Congestive Heart Clustered Wound: No Failure, Coronary Artery Disease, Pending Amputation On Presentation Hypertension, Myocardial Infarction, Peripheral Arterial Disease, Type II Diabetes, Neuropathy Photos Photo Uploaded By: Elliot Gurney, RN, BSN, Kim on 08/22/2014 15:53:26 Wound Measurements Length: (cm) 6.5 Width: (cm) 4.2 Depth: (cm) 0.3 Area: (cm) 21.441 Volume: (cm) 6.432 % Reduction in Area: 13.3% % Reduction in Volume: 13.3% Epithelialization: Medium (34-66%) Wound Description Classification: RITCHIE, KLEE. ([id number]) Foul Odor After Cleansing: No Full Thickness With Exposed Support Structures Diabetic Severity Grade 2 (Wagner): Wound Margin: Distinct, outline attached Exudate Amount: Large Exudate Type: Serosanguineous Exudate Color: red, brown Wound Bed Granulation Amount: Medium (34-66%) Exposed Structure Granulation Quality: Pink Fascia Exposed: No Necrotic Amount: Small (1-33%) Fat  Layer Exposed: No Necrotic Quality: Adherent Slough Tendon Exposed: No Muscle Exposed: No Joint Exposed: No Bone Exposed: No Limited to Skin Breakdown Periwound Skin Texture Texture Color No Abnormalities Noted: No No Abnormalities Noted: No Callus: No Atrophie Blanche: No Crepitus: No Cyanosis: No Excoriation: No Ecchymosis: No Fluctuance: No Erythema: No Friable: No Hemosiderin Staining: No Induration: No Mottled: No Localized Edema: No Pallor: No Rash: No Rubor: No Scarring: No Moisture No Abnormalities Noted: No Dry / Scaly: No Maceration: Yes Moist: Yes Wound Preparation Ulcer Cleansing: Rinsed/Irrigated with Saline Topical Anesthetic Applied: Other: lidocaine 4%, Electronic Signature(s) Signed: 08/24/2014 4:58:31 PM By: Elliot Gurney, RN, BSN, Kim RN, BSN Entered By: Elliot Gurney, RN, BSN, Kim on 08/22/2014 12:05:56 Balgobin, [name] ([id number]) -------------------------------------------------------------------------------- Wound Assessment Details Patient Name: [name], [name] Date of Service: 08/22/2014 11:45 AM Medical Record Number: [id number] Patient Account Number: [id number] Date of Birth/Sex: 1920/06/22 (81 y.o. Male) Treating RN: Huel Coventry Primary Care Physician: Lindwood Qua Other Clinician: Referring Physician: Lindwood Qua Treating Physician/Extender: Rudene Re in Treatment: 16 Wound Status Wound Number: 2 Primary Arterial Insufficiency Ulcer Etiology: Wound Location: Left Calcaneous - Lateral Wound Open Wounding Event: Gradually Appeared Status: Date Acquired: 01/24/2014 Comorbid Cataracts, Chronic sinus Weeks Of Treatment: 16 History: problems/congestion, Congestive Heart Clustered Wound: Yes Failure, Coronary Artery Disease, Hypertension, Myocardial Infarction, Peripheral Arterial Disease, Type II Diabetes, Neuropathy Photos Photo Uploaded By: Elliot Gurney, RN, BSN, Kim on 08/22/2014 15:53:27 Wound Measurements Length: (cm) 0.3 Width:  (cm) 0.2 Depth: (cm) 0.2 Area: (cm) 0.047 Volume: (cm) 0.009 % Reduction in Area: 99.2% % Reduction in Volume: 98.4% Wound Description Full Thickness Without Exposed Classification: Support Structures Diabetic Severity Grade 2 (Wagner): Exudate Amount: None Present Wound Bed Granulation Amount: Large (67-100%) Exposed Structure Mckey, Jeanpierre L. ([id number]) Granulation Quality: Pale Fascia Exposed: No Necrotic Amount: Small (1-33%) Fat Layer Exposed: No Necrotic Quality: Adherent Slough  Tendon Exposed: No Muscle Exposed: No Joint Exposed: No Bone Exposed: No Limited to Skin Breakdown Periwound Skin Texture Texture Color No Abnormalities Noted: No No Abnormalities Noted: No Callus: No Atrophie Blanche: No Crepitus: No Cyanosis: No Excoriation: No Ecchymosis: No Fluctuance: No Erythema: No Friable: No Hemosiderin Staining: No Induration: No Mottled: No Localized Edema: No Pallor: No Rash: No Rubor: No Scarring: No Moisture No Abnormalities Noted: No Dry / Scaly: No Maceration: Yes Moist: No Wound Preparation Ulcer Cleansing: Rinsed/Irrigated with Saline Topical Anesthetic Applied: Other: lidociane 4%, Electronic Signature(s) Signed: 08/24/2014 4:58:31 PM By: Elliot Gurney, RN, BSN, Kim RN, BSN Entered By: Elliot Gurney, RN, BSN, Kim on 08/22/2014 12:06:27 [name] ([id number]) -------------------------------------------------------------------------------- Wound Assessment Details Patient Name: [name], [name] Date of Service: 08/22/2014 11:45 AM Medical Record Number: [id number] Patient Account Number: [id number] Date of Birth/Sex: 1920/10/07 (81 y.o. Male) Treating RN: Huel Coventry Primary Care Physician: Lindwood Qua Other Clinician: Referring Physician: Lindwood Qua Treating Physician/Extender: Rudene Re in Treatment: 16 Wound Status Wound Number: 3 Primary Arterial Insufficiency Ulcer Etiology: Wound Location: Left Toe Great Wound  Open Wounding Event: Gradually Appeared Status: Date Acquired: 01/24/2014 Comorbid Cataracts, Chronic sinus Weeks Of Treatment: 16 History: problems/congestion, Congestive Heart Clustered Wound: No Failure, Coronary Artery Disease, Hypertension, Myocardial Infarction, Peripheral Arterial Disease, Type II Diabetes, Neuropathy Photos Photo Uploaded By: Elliot Gurney, RN, BSN, Kim on 08/22/2014 15:53:54 Wound Measurements Length: (cm) 0.3 Width: (cm) 0.4 Depth: (cm) 0.2 Area: (cm) 0.094 Volume: (cm) 0.019 % Reduction in Area: 89.1% % Reduction in Volume: 77.9% Epithelialization: Small (1-33%) Wound Description Full Thickness Without Exposed Classification: Support Structures Diabetic Severity Grade 1 (Wagner): Wound Margin: Flat and Intact Wound Bed Granulation Amount: Small (1-33%) Exposed Structure [name], [name] ([id number]) Granulation Quality: Pale Fascia Exposed: No Necrotic Amount: Large (67-100%) Fat Layer Exposed: No Necrotic Quality: Adherent Slough Tendon Exposed: No Muscle Exposed: No Joint Exposed: No Bone Exposed: No Limited to Skin Breakdown Periwound Skin Texture Texture Color No Abnormalities Noted: No No Abnormalities Noted: No Callus: Yes Moisture No Abnormalities Noted: No Dry / Scaly: Yes Moist: Yes Wound Preparation Ulcer Cleansing: Rinsed/Irrigated with Saline Topical Anesthetic Applied: Other: lidociane 4%, Electronic Signature(s) Signed: 08/24/2014 4:58:31 PM By: Elliot Gurney, RN, BSN, Kim RN, BSN Entered By: Elliot Gurney, RN, BSN, Kim on 08/22/2014 12:06:59 [name], [name] ([id number]) -------------------------------------------------------------------------------- Vitals Details Patient Name: [name], [name] Date of Service: 08/22/2014 11:45 AM Medical Record Number: [id number] Patient Account Number: [id number] Date of Birth/Sex: [birth year] (81 y.o. Male) Treating RN: Huel Coventry Primary Care Physician: Lindwood Qua Other Clinician: Referring  Physician: Lindwood Qua Treating Physician/Extender: Rudene Re in Treatment: 16 Vital Signs Time Taken: 11:51 Pulse (bpm): 88 Height (in): 70 Respiratory Rate (breaths/min): 20 Weight (lbs): 200 Blood Pressure (mmHg): 121/51 Body Mass Index (BMI): 28.7 Reference Range: 80 - 120 mg / dl Electronic Signature(s) Signed: 08/24/2014 4:58:31 PM By: Elliot Gurney, RN, BSN, Kim RN, BSN Entered By: Elliot Gurney, RN, BSN, Kim on 08/22/2014 11:53:16

## 2014-08-29 ENCOUNTER — Encounter: Payer: Medicare Other | Admitting: Surgery

## 2014-08-29 DIAGNOSIS — L97323 Non-pressure chronic ulcer of left ankle with necrosis of muscle: Secondary | ICD-10-CM | POA: Diagnosis not present

## 2014-08-30 NOTE — Progress Notes (Signed)
SAMAEL, BLADES ([id number]) Visit Report for 08/29/2014 Arrival Information Details Patient Name: [name], [name]. Date of Service: 08/29/2014 11:30 AM Medical Record Number: [id number] Patient Account Number: [id number] Date of Birth/Sex: 1920/03/22 (81 y.o. Male) Treating RN: Huel Coventry Primary Care Physician: Lindwood Qua Other Clinician: Referring Physician: Lindwood Qua Treating Physician/Extender: Rudene Re in Treatment: 17 Visit Information History Since Last Visit Added or deleted any medications: No Patient Arrived: Wheel Chair Any new allergies or adverse reactions: No Arrival Time: 11:20 Had a fall or experienced change in No Accompanied By: son activities of daily living that may affect Transfer Assistance: Manual risk of falls: Patient Identification Verified: Yes Signs or symptoms of abuse/neglect since last No Secondary Verification Process Yes visito Completed: Hospitalized since last visit: No Patient Requires Transmission-Based No Has Dressing in Place as Prescribed: Yes Precautions: Pain Present Now: No Patient Has Alerts: Yes Patient Alerts: 04/27/13 ABI (L) 0.91 (R) 0.71 Electronic Signature(s) Signed: 08/29/2014 5:20:17 PM By: Elliot Gurney, RN, BSN, Kim RN, BSN Entered By: Elliot Gurney, RN, BSN, Kim on 08/29/2014 11:21:03 Watson, [name] ([id number]) -------------------------------------------------------------------------------- Lower Extremity Assessment Details Patient Name: [name], [name] Date of Service: 08/29/2014 11:30 AM Medical Record Number: [id number] Patient Account Number: [id number] Date of Birth/Sex: 22-Aug-1920 (81 y.o. Male) Treating RN: Huel Coventry Primary Care Physician: Lindwood Qua Other Clinician: Referring Physician: Lindwood Qua Treating Physician/Extender: Rudene Re in Treatment: 17 Vascular Assessment Pulses: Posterior Tibial Dorsalis Pedis Palpable: [Left:No] Doppler: [Left:Monophasic] Extremity colors,  hair growth, and conditions: Extremity Color: [Left:Pale] Hair Growth on Extremity: [Left:No] Temperature of Extremity: [Left:Warm] Capillary Refill: [Left:> 3 seconds] Toe Nail Assessment Left: Right: Thick: Yes Discolored: Yes Deformed: No Improper Length and Hygiene: Yes Electronic Signature(s) Signed: 08/29/2014 5:20:17 PM By: Elliot Gurney, RN, BSN, Kim RN, BSN Entered By: Elliot Gurney, RN, BSN, Kim on 08/29/2014 11:28:34 Kenney, [name] ([id number]) -------------------------------------------------------------------------------- Multi Wound Chart Details Patient Name: [name], [name] Date of Service: 08/29/2014 11:30 AM Medical Record Number: [id number] Patient Account Number: [id number] Date of Birth/Sex: Mar 15, 1920 (81 y.o. 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] Comorbid History: [1:Cataracts, Chronic sinus Cataracts, Chronic sinus Cataracts, Chronic sinus problems/congestion, Congestive Heart Failure, Congestive Heart Failure, Congestive Heart Failure, Coronary Artery Disease, Coronary Artery Disease, Coronary  Artery Disease, Hypertension, Myocardial Hypertension, Myocardial Hypertension, Myocardial Infarction, Peripheral Arterial Disease, Type II Arterial Disease, Type II Arterial Disease, Type II Diabetes, Neuropathy] [2:problems/congestion, Infarction,   Peripheral Diabetes, Neuropathy] [3:problems/congestion, Infarction, Peripheral Diabetes, Neuropathy] Date Acquired: [1:01/24/2014] [2:01/24/2014] [3:01/24/2014] Weeks of Treatment: [1:17] [2:17] [3:17] Wound Status: [1:Open] [2:Open] [3:Open] Clustered Wound: [1:No] [2:Yes] [3:No] Pending Amputation on Yes [2:No] [3:No] Presentation: Measurements L x W x D 6.5x4.7x0.3 [2:0.5x0.4x0.2] [3:0.5x0.4x0.2] (cm) Area (cm) : [1:23.994] [2:0.157] [3:0.157] Volume (cm) : [1:7.198] [2:0.031] [3:0.031] % Reduction in Area: [1:3.00%] [2:97.20%] [3:81.80%] % Reduction in Volume: 3.00% [2:94.50%] [3:64.00%] Classification: [1:Full Thickness With Exposed Support Structures] [2:Full Thickness Without Exposed Support Structures] [3:Full Thickness Without Exposed Support Structures] HBO Classification: [1:Grade 2] [2:Grade 2] [3:Grade 1] Exudate Amount: [1:Large] [2:None Present] [3:N/A] Exudate Type: [1:Serosanguineous] [2:N/A] [3:N/A] Exudate Color: red,  brown N/A N/A Wound Margin: Distinct, outline attached N/A Flat and Intact Granulation Amount: Large (67-100%) Large (67-100%) Small (1-33%) Granulation Quality: Pink Pale Pale Necrotic Amount: Small (1-33%) Small (1-33%) Large (67-100%) Exposed Structures: Fascia: No Fascia: No Fascia: No Fat: No Fat: No Fat: No Tendon: No Tendon: No Tendon: No Muscle: No Muscle: No Muscle: No Joint: No Joint: No Joint: No Bone: No Bone: No Bone: No Limited to Skin Limited to Skin Limited to Skin Breakdown Breakdown Breakdown Epithelialization: Medium (34-66%) Small (1-33%) Small (1-33%) Periwound Skin Texture: Edema: No Edema: No No Abnormalities Noted Excoriation: No Excoriation: No Induration: No Induration: No Callus: No Callus: No Crepitus: No Crepitus: No Fluctuance: No Fluctuance: No Friable: No Friable: No Rash: No Rash: No Scarring: No Scarring: No Periwound Skin Maceration: Yes Maceration: Yes Moist: Yes Moisture: Moist:  Yes Moist: No Dry/Scaly: Yes Dry/Scaly: No Dry/Scaly: No Periwound Skin Color: Atrophie Blanche: No Atrophie Blanche: No No Abnormalities Noted Cyanosis: No Cyanosis: No Ecchymosis: No Ecchymosis: No Erythema: No Erythema: No Hemosiderin Staining: No Hemosiderin Staining: No Mottled: No Mottled: No Pallor: No Pallor: No Rubor: No Rubor: No Tenderness on No No No Palpation: Wound Preparation: Ulcer Cleansing: Ulcer Cleansing: Ulcer Cleansing: Rinsed/Irrigated with Rinsed/Irrigated with Rinsed/Irrigated with Saline Saline Saline Topical Anesthetic Topical Anesthetic Topical Anesthetic Applied: Other: lidocaine Applied: Other: lidociane Applied: Other: lidociane 4% 4% 4% Treatment Notes Electronic Signature(s) Signed: 08/29/2014 5:20:17 PM By: Elliot Gurney, RN, BSN, Kim RN, BSN Entered By: Elliot Gurney, RN, BSN, Kim on 08/29/2014 11:35:27 [place], [name] ([id number]) TAMARION, HAYMOND ([id number]) -------------------------------------------------------------------------------- Multi-Disciplinary Care Plan Details Patient Name: [name], [name] Date of Service: 08/29/2014 11:30 AM Medical Record Number: [id number] Patient Account Number: [id number] Date of Birth/Sex: 1920/04/20 (81 y.o. Male) Treating RN: Huel Coventry Primary Care Physician: Lindwood Qua Other Clinician: Referring Physician: Lindwood Qua Treating Physician/Extender: Rudene Re in Treatment: 24 Active Inactive Abuse / Safety / Falls / Self Care Management Nursing Diagnoses: Potential for falls Goals: Patient will remain injury free Date Initiated: 04/28/2014 Goal Status: Active Interventions: Assess fall risk on admission and as needed Notes: Necrotic Tissue Nursing Diagnoses: Impaired tissue integrity related to necrotic/devitalized tissue Goals: Necrotic/devitalized tissue will be minimized in the wound bed Date Initiated: 04/28/2014 Goal Status: Active Interventions: Provide education on necrotic  tissue and debridement process Treatment Activities: Apply topical anesthetic as ordered : 08/29/2014 Notes: Nutrition Nursing Diagnoses: Potential for alteratiion in Nutrition/Potential for imbalanced nutrition Bargo, Tallen L. ([id number]) Goals: Patient/caregiver agrees to and verbalizes understanding of need to use nutritional supplements and/or vitamins as prescribed Date Initiated: 04/28/2014 Goal Status: Active Interventions: Assess patient nutrition upon admission and as needed per policy Notes: Orientation to the Wound Care Program Nursing Diagnoses: Knowledge deficit related to the wound healing center program Goals: Patient/caregiver will verbalize understanding of the Wound Healing Center Program Date Initiated: 04/28/2014 Goal Status: Active Interventions: Provide education on orientation to the wound center Notes: Wound/Skin Impairment Nursing Diagnoses: Impaired tissue integrity Goals: Ulcer/skin breakdown will have a volume reduction of 30% by week 4 Date Initiated: 04/28/2014 Goal Status: Active Interventions: Assess ulceration(s) every visit Notes: Electronic Signature(s) Signed: 08/29/2014 5:20:17 PM By: Elliot Gurney, RN, BSN, Kim RN, BSN Entered By: Elliot Gurney, RN, BSN, Kim on 08/29/2014 11:35:10 Schubach, [name] ([id number]) -------------------------------------------------------------------------------- Pain Assessment Details Patient Name: [name], [name] Date of Service: 08/29/2014 11:30 AM Medical Record Number: [id number] Patient Account Number: [id number] Date of Birth/Sex: Mar 30, 1920 (81 y.o. Male) Treating RN: Huel Coventry Primary Care Physician: Lindwood Qua Other Clinician: Referring  Physician: Lindwood Qua Treating Physician/Extender: Rudene Re in Treatment: 17 Active Problems Location of Pain Severity and Description of Pain Patient Has Paino No Site Locations Pain Management and Medication Current Pain Management: Electronic  Signature(s) Signed: 08/29/2014 5:20:17 PM By: Elliot Gurney, RN, BSN, Kim RN, BSN Entered By: Elliot Gurney, RN, BSN, Kim on 08/29/2014 11:21:12 Ade, [name] ([id number]) -------------------------------------------------------------------------------- Wound Assessment Details Patient Name: [name], [name] Date of Service: 08/29/2014 11:30 AM Medical Record Number: [id number] Patient Account Number: [id number] Date of Birth/Sex: 09-01-1920 (82 y.o. Male) Treating RN: Huel Coventry Primary Care Physician: Lindwood Qua Other Clinician: Referring Physician: Lindwood Qua Treating Physician/Extender: Rudene Re in Treatment: 17 Wound Status Wound Number: 1 Primary Arterial Insufficiency Ulcer Etiology: Wound Location: Left Achilles Wound Open Wounding Event: Gradually Appeared Status: Date Acquired: 01/24/2014 Comorbid Cataracts, Chronic sinus Weeks Of Treatment: 17 History: problems/congestion, Congestive Heart Clustered Wound: No Failure, Coronary Artery Disease, Pending Amputation On Presentation Hypertension, Myocardial Infarction, Peripheral Arterial Disease, Type II Diabetes, Neuropathy Photos Photo Uploaded By: Elliot Gurney, RN, BSN, Kim on 08/29/2014 17:29:34 Wound Measurements Length: (cm) 6.5 Width: (cm) 4.7 Depth: (cm) 0.3 Area: (cm) 23.994 Volume: (cm) 7.198 % Reduction in Area: 3% % Reduction in Volume: 3% Epithelialization: Medium (34-66%) Tunneling: No Undermining: No Wound Description Classification: Hang, Garett L. ([id number]) Foul Odor After Cleansing: No Full Thickness With Exposed Support Structures Diabetic Severity Grade 2 (Wagner): Wound Margin: Distinct, outline attached Exudate Amount: Large Exudate Type: Serosanguineous Exudate Color: red, brown Wound Bed Granulation Amount: Large (67-100%) Exposed Structure Granulation Quality: Pink Fascia Exposed: No Necrotic Amount: Small (1-33%) Fat Layer Exposed: No Necrotic Quality: Adherent  Slough Tendon Exposed: No Muscle Exposed: No Joint Exposed: No Bone Exposed: No Limited to Skin Breakdown Periwound Skin Texture Texture Color No Abnormalities Noted: No No Abnormalities Noted: No Callus: No Atrophie Blanche: No Crepitus: No Cyanosis: No Excoriation: No Ecchymosis: No Fluctuance: No Erythema: No Friable: No Hemosiderin Staining: No Induration: No Mottled: No Localized Edema: No Pallor: No Rash: No Rubor: No Scarring: No Moisture No Abnormalities Noted: No Dry / Scaly: No Maceration: Yes Moist: Yes Wound Preparation Ulcer Cleansing: Rinsed/Irrigated with Saline Topical Anesthetic Applied: Other: lidocaine 4%, Electronic Signature(s) Signed: 08/29/2014 5:20:17 PM By: Elliot Gurney, RN, BSN, Kim RN, BSN Entered By: Elliot Gurney, RN, BSN, Kim on 08/29/2014 11:31:59 Grosvenor, [name] ([id number]) -------------------------------------------------------------------------------- Wound Assessment Details Patient Name: [name], [name] Date of Service: 08/29/2014 11:30 AM Medical Record Number: [id number] Patient Account Number: [id number] Date of Birth/Sex: February 28, 1920 (81 y.o. Male) Treating RN: Huel Coventry Primary Care Physician: Lindwood Qua Other Clinician: Referring Physician: Lindwood Qua Treating Physician/Extender: Rudene Re in Treatment: 17 Wound Status Wound Number: 2 Primary Arterial Insufficiency Ulcer Etiology: Wound Location: Left Calcaneous - Lateral Wound Open Wounding Event: Gradually Appeared Status: Date Acquired: 01/24/2014 Comorbid Cataracts, Chronic sinus Weeks Of Treatment: 17 History: problems/congestion, Congestive Heart Clustered Wound: Yes Failure, Coronary Artery Disease, Hypertension, Myocardial Infarction, Peripheral Arterial Disease, Type II Diabetes, Neuropathy Photos Photo Uploaded By: Elliot Gurney, RN, BSN, Kim on 08/29/2014 17:30:06 Wound Measurements Length: (cm) 0.5 Width: (cm) 0.4 Depth: (cm) 0.2 Area: (cm)  0.157 Volume: (cm) 0.031 % Reduction in Area: 97.2% % Reduction in Volume: 94.5% Epithelialization: Small (1-33%) Tunneling: No Undermining: No Wound Description Full Thickness Without Exposed Classification: Support Structures Diabetic Severity Grade 2 (Wagner): Exudate Amount: None Present Wound Bed Granulation Amount: Large (67-100%) Exposed Structure Dorsey, Chales L. ([id number]) Granulation Quality: Pale Fascia Exposed: No Necrotic Amount: Small (1-33%) Fat Layer Exposed: No Necrotic Quality: Adherent  Slough Tendon Exposed: No Muscle Exposed: No Joint Exposed: No Bone Exposed: No Limited to Skin Breakdown Periwound Skin Texture Texture Color No Abnormalities Noted: No No Abnormalities Noted: No Callus: No Atrophie Blanche: No Crepitus: No Cyanosis: No Excoriation: No Ecchymosis: No Fluctuance: No Erythema: No Friable: No Hemosiderin Staining: No Induration: No Mottled: No Localized Edema: No Pallor: No Rash: No Rubor: No Scarring: No Moisture No Abnormalities Noted: No Dry / Scaly: No Maceration: Yes Moist: No Wound Preparation Ulcer Cleansing: Rinsed/Irrigated with Saline Topical Anesthetic Applied: Other: lidociane 4%, Electronic Signature(s) Signed: 08/29/2014 5:20:17 PM By: Elliot Gurney, RN, BSN, Kim RN, BSN Entered By: Elliot Gurney, RN, BSN, Kim on 08/29/2014 11:32:34 Wessels, [name] ([id number]) -------------------------------------------------------------------------------- Wound Assessment Details Patient Name: [name], [name] Date of Service: 08/29/2014 11:30 AM Medical Record Number: [id number] Patient Account Number: [id number] Date of Birth/Sex: 1920-12-22 (81 y.o. Male) Treating RN: Huel Coventry Primary Care Physician: Lindwood Qua Other Clinician: Referring Physician: Lindwood Qua Treating Physician/Extender: Rudene Re in Treatment: 17 Wound Status Wound Number: 3 Primary Arterial Insufficiency Ulcer Etiology: Wound Location:  Left Toe Great Wound Open Wounding Event: Gradually Appeared Status: Date Acquired: 01/24/2014 Comorbid Cataracts, Chronic sinus Weeks Of Treatment: 17 History: problems/congestion, Congestive Heart Clustered Wound: No Failure, Coronary Artery Disease, Hypertension, Myocardial Infarction, Peripheral Arterial Disease, Type II Diabetes, Neuropathy Photos Photo Uploaded By: Elliot Gurney, RN, BSN, Kim on 08/29/2014 17:30:06 Wound Measurements Length: (cm) 0.5 Width: (cm) 0.4 Depth: (cm) 0.2 Area: (cm) 0.157 Volume: (cm) 0.031 % Reduction in Area: 81.8% % Reduction in Volume: 64% Epithelialization: Small (1-33%) Wound Description Full Thickness Without Exposed Classification: Support Structures Diabetic Severity Grade 1 (Wagner): Wound Margin: Flat and Intact Wound Bed Granulation Amount: Small (1-33%) Exposed Structure Tuch, Iwao L. ([id number]) Granulation Quality: Pale Fascia Exposed: No Necrotic Amount: Large (67-100%) Fat Layer Exposed: No Necrotic Quality: Adherent Slough Tendon Exposed: No Muscle Exposed: No Joint Exposed: No Bone Exposed: No Limited to Skin Breakdown Periwound Skin Texture Texture Color No Abnormalities Noted: No No Abnormalities Noted: No Moisture No Abnormalities Noted: No Dry / Scaly: Yes Moist: Yes Wound Preparation Ulcer Cleansing: Rinsed/Irrigated with Saline Topical Anesthetic Applied: Other: lidociane 4%, Electronic Signature(s) Signed: 08/29/2014 5:20:17 PM By: Elliot Gurney, RN, BSN, Kim RN, BSN Entered By: Elliot Gurney, RN, BSN, Kim on 08/29/2014 11:32:55 [name], [name] ([id number]) -------------------------------------------------------------------------------- Vitals Details Patient Name: [name], [name] Date of Service: 08/29/2014 11:30 AM Medical Record Number: [id number] Patient Account Number: [id number] Date of Birth/Sex: 29-Nov-1920 (81 y.o. Male) Treating RN: Huel Coventry Primary Care Physician: Lindwood Qua Other  Clinician: Referring Physician: Lindwood Qua Treating Physician/Extender: Rudene Re in Treatment: 17 Vital Signs Time Taken: 11:21 Pulse (bpm): 87 Height (in): 70 Respiratory Rate (breaths/min): 20 Weight (lbs): 200 Blood Pressure (mmHg): 145/46 Body Mass Index (BMI): 28.7 Reference Range: 80 - 120 mg / dl Electronic Signature(s) Signed: 08/29/2014 5:20:17 PM By: Elliot Gurney, RN, BSN, Kim RN, BSN Entered By: Elliot Gurney, RN, BSN, Kim on 08/29/2014 11:25:13

## 2014-08-30 NOTE — Progress Notes (Signed)
ALIJAH, AKRAM ([id number]) Visit Report for 08/29/2014 Chief Complaint Document Details Patient Name: [name], [name] Date of Service: 08/29/2014 11:30 AM Medical Record Number: [id number] Patient Account Number: [id number] Date of Birth/Sex: 03-24-1920 (81 y.o. Male) Treating RN: Primary Care Physician: Lindwood Qua Other Clinician: Referring Physician: Lindwood Qua Treating Physician/Extender: Rudene Re in Treatment: 17 Information Obtained from: Patient Chief Complaint Patient presents to the wound care center for a consult due non healing wound 81 year old gentleman who comes with a history of having some ulcerated areas on his heels since February 2016 and then a large injury to his left posterior heel and ankle since about a month. Electronic Signature(s) Signed: 08/29/2014 11:51:32 AM By: Evlyn Kanner MD, FACS Entered By: Evlyn Kanner on 08/29/2014 11:51:32 Jesson, [name] ([id number]) -------------------------------------------------------------------------------- Debridement Details Patient Name: [name], [name] Date of Service: 08/29/2014 11:30 AM Medical Record Number: [id number] Patient Account Number: [id number] Date of Birth/Sex: 07-11-1920 (81 y.o. Male) Treating RN: Primary Care Physician: Lindwood Qua Other Clinician: Referring Physician: Lindwood Qua Treating Physician/Extender: Rudene Re in Treatment: 17 Debridement Performed for Wound #1 Left Achilles Assessment: Performed By: Physician Tristan Schroeder., MD Debridement: Debridement Pre-procedure Yes Verification/Time Out Taken: Start Time: 11:41 Pain Control: Other : lidocaine 4% Level: Skin/Subcutaneous Tissue Total Area Debrided (L x 3 (cm) x 4 (cm) = 12 (cm) W): Tissue and other Viable, Non-Viable, Exudate, Fibrin/Slough, Subcutaneous material debrided: Instrument: Curette Bleeding: Minimum Hemostasis Achieved: Pressure End Time: 11:45 Procedural Pain: 0 Post  Procedural Pain: 0 Response to Treatment: Procedure was tolerated well Post Debridement Measurements of Total Wound Length: (cm) 6.5 Width: (cm) 4.7 Depth: (cm) 0.4 Volume: (cm) 9.598 Electronic Signature(s) Signed: 08/29/2014 11:50:56 AM By: Evlyn Kanner MD, FACS Entered By: Evlyn Kanner on 08/29/2014 11:50:56 Burandt, Gevork [name] Ewings ([id number]) -------------------------------------------------------------------------------- Debridement Details Patient Name: [name], [name] Date of Service: 08/29/2014 11:30 AM Medical Record Number: [id number] Patient Account Number: [id number] Date of Birth/Sex: [birth year] (81 y.o. Male) Treating RN: Primary Care Physician: Lindwood Qua Other Clinician: Referring Physician: Lindwood Qua Treating Physician/Extender: Rudene Re in Treatment: 17 Debridement Performed for Wound #2 Left,Lateral Calcaneous Assessment: Performed By: Physician Tristan Schroeder., MD Debridement: Debridement Pre-procedure Yes Verification/Time Out Taken: Start Time: 11:41 Pain Control: Other : lidocaine 4% Level: Skin/Subcutaneous Tissue Total Area Debrided (L x 0.5 (cm) x 0.4 (cm) = 0.2 (cm) W): Tissue and other Viable, Non-Viable, Exudate, Fibrin/Slough, Subcutaneous material debrided: Instrument: Curette Bleeding: Minimum Hemostasis Achieved: Pressure End Time: 11:45 Procedural Pain: 0 Post Procedural Pain: 0 Response to Treatment: Procedure was tolerated well Post Debridement Measurements of Total Wound Length: (cm) 0.5 Width: (cm) 0.4 Depth: (cm) 0.2 Volume: (cm) 0.031 Electronic Signature(s) Signed: 08/29/2014 11:51:09 AM By: Evlyn Kanner MD, FACS Entered By: Evlyn Kanner on 08/29/2014 11:51:08 Elsass, Johari [name] Ewings ([id number]) -------------------------------------------------------------------------------- Debridement Details Patient Name: [name], [name] Date of Service: 08/29/2014 11:30 AM Medical Record Number: [id number] Patient Account  Number: [id number] Date of Birth/Sex: 01/01/1921 (81 y.o. Male) Treating RN: Primary Care Physician: Lindwood Qua Other Clinician: Referring Physician: Lindwood Qua Treating Physician/Extender: Rudene Re in Treatment: 17 Debridement Performed for Wound #3 Left Toe Great Assessment: Performed By: Physician Tristan Schroeder., MD Debridement: Debridement Pre-procedure Yes Verification/Time Out Taken: Start Time: 11:41 Pain Control: Other : lidocaine 4% Level: Skin/Subcutaneous Tissue Total Area Debrided (L x 0.5 (cm) x 0.4 (cm) = 0.2 (cm) W): Tissue and other Viable, Non-Viable, Exudate, Fibrin/Slough, Subcutaneous material debrided: Instrument: Curette Bleeding: Minimum Hemostasis Achieved: Pressure End Time: 11:45  Procedural Pain: 0 Post Procedural Pain: 0 Response to Treatment: Procedure was tolerated well Post Debridement Measurements of Total Wound Length: (cm) 0.5 Width: (cm) 0.4 Depth: (cm) 0.3 Volume: (cm) 0.047 Electronic Signature(s) Signed: 08/29/2014 11:51:21 AM By: Evlyn Kanner MD, FACS Entered By: Evlyn Kanner on 08/29/2014 11:51:21 [name] Ewings ([id number]) -------------------------------------------------------------------------------- HPI Details Patient Name: [name], [name] Date of Service: 08/29/2014 11:30 AM Medical Record Number: [id number] Patient Account Number: [id number] Date of Birth/Sex: [birth year] (81 y.o. Male) Treating RN: Primary Care Physician: Lindwood Qua Other Clinician: Referring Physician: Lindwood Qua Treating Physician/Extender: Rudene Re in Treatment: 17 History of Present Illness HPI Description: 81 year old gentleman who was known to be a diabetic for many years has peripheral neuropathy recently went to his primary care doctor for a punctured wound on his left heel which was something he had noticed. The patient then was referred to a podiatrist who referred him to Dr. Wyn Quaker in the vascular  surgery department and I understand the procedure was done on 03/14/2014 with a left lower extremity vascular procedure and stenting was done. Details of this are not available at the present time. The patient has been applying Neosporin to his leg and has not had any wound care addressed so far. patient has also had a history of coronary artery disease in the past and hasn't had a CABG and a pacemaker placement in the remote past.Other details and notes are pending. the patient is not in pain lives alone and has some home health and other aides coming to help him with his daily chores and meals. reviewing the vascular notes I understand the procedure was done on 03/14/2014 and he was operated by Dr. Wyn Quaker. he had a catheter placement to the left peroneal artery and a aortogram and selective left lower extremity angiogram was done. He also had a percutaneous transluminal angioplasty of the left peroneal artery and the tibioperoneal trunk. The distal SFA and above-knee popliteal artery were also angioplastied. A subcutaneous stent placement to the distal superficial femoral artery was also done. 05/05/2014 -- the patient has not had any change in his health and after much consideration is decided that he does not want HBOT as he is claustrophobic and was unable to tolerate being in the chamber for 90 minutes. I have discussed with him that his most recent x-ray of the foot shows that he has the distal phalanx of the left great toe showing changes with osteomyelitis cannot be excluded at that site. He tells me that he cannot have an MRI because of his defibrillator and hence we will order a triple phase bone scan. I have also reviewed his culture report which shows several organisms and he has sensitivity to tetracycline and we have recommended he takes this for 14 days and this has been given to him on 05/02/2014 05/12/2014 the bone scan done on 05/10/2014 shows #1 findings are worrisome for  osteomyelitis involving the calcaneus of the left foot, #2 increase uptake localizing to the left second and third toe on all 3 phases. Cannot rule out osteomyelitis in this area. And #3 left foot cellulitis. 05/19/2014 -- reviewed several reports which we have received back on [facility]. #1 chest x-ray done on April 21 shows chronic changes in the left base no acute findings. #2 his CBC is within normal limits. #3 hemoglobin A1c was 8.5%. #4 EKG done on 26 April was within normal limits due to his electronic ventricular pacemaker. 05/26/2014 -- he has had his PICC line  placed and is taking vancomycin daily basis. He is to start hyperbaric oxygen therapy on this coming Monday. 06/09/2014 -- he saw Dr. Sampson Goon yesterday and besides his IV antibiotic I believe a oral antibiotic was HARVY, RIERA. ([id number]) also given and this may be Cipro. Patient is feeling fine otherwise does not have any symptoms though his blood pressure this morning in the wound center has been 90/50. We will check his blood pressure in the supine position. 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 Complained of worsening fatigue yesterday after HBO. Feels better today. Has appointment with PCP this afternoon. He wishes to hold off on HBO until next week. No significant pain. No fever or chills. Stable drainage. 06/23/2014 Erving has taken a break from HBO T and has been feeling a little better. He was seen by the nurse practitioner at his PCPs office and at that time his vitals were stable and they did get some EKG done which was within normal limits. There have sent out some lab work which we still have to receive reports. He is going to see his PCP back this afternoon and will be seeing Dr. Sampson Goon tomorrow for review. addendum: we have received labs from his PCPs office and note that his potassium is high at 5.4 and his BUN/creatinine is slightly raised. His blood  sugar was 216. His total protein and albumen were 5.9 and 3 and this was low. His HandH was 10.3 and 31.4 WBC was 8.2 and his platelets were 304. CRP was 29.6 which was high. Vancomycin trough was 19.1 which was normal TSH was 3.11 which was normal 06/30/2014 -- Eliberto Ivory feels weak today and he feels his stomach is acting up and his not feeling up to doing hyperbaric oxygen therapy. He would like to take a break today and tomorrow and resume on Monday. He is on vancomycin and Levaquin as per ID and he has an appointment to see the vascular surgeons in about 10 days' time. 07/07/2014 -- Eliberto Ivory has been feeling weak all along and he has decided that he is not going to continue with hyperbaric oxygen therapy and a longer period. He had finished 16 of his 40 treatments and will not take anymore. He has an appointment with ID tomorrow and also his vascular surgeons and I will have a conference with both of them regarding his further care. 07/14/2014 -- over the last week I have contacted Dr. Sampson Goon and Dr. Wyn Quaker and discussed his care. Dr. Wyn Quaker agrees that he will take him to the OR and debrided his wound and use a wound VAC. Dr. Sampson Goon has altered his anti-biotic regimen - vancomycin has been stopped and he is on oral levofloxacin. The patient's surgery is scheduled for July 7. 07/21/2014 -- he continues to feel unwell and has a lot of GI disturbances to probably his antibiotics. He also has developed a new blister on his left fourth toe and is concerned about his upcoming surgery which is scheduled for next Thursday. 08/04/2014 -- he was taken to the OR on 07/28/2014 and a debridement of the left heel was done of skin and soft tissue and the exposed calcaneum to remove all nonviable tissue. A wound VAC was then applied at the end of the procedure. 08/12/2014 -- he was seen by Dr. Sampson Goon today and he is going to continue one more week of Levaquin and then stop it and see him back in 3  weeks' time. I have  reviewed his notes from today. 08/29/2014 -- he saw Dr. Wyn Quaker his surgeon who did not have any other suggestions except for me to proceed with the skin substitute when ready. Electronic Signature(s) SHASHANK, KWASNIK ([id number]) Signed: 08/29/2014 11:52:15 AM By: Evlyn Kanner MD, FACS Entered By: Evlyn Kanner on 08/29/2014 11:52:14 Gellerman, [name] ([id number]) -------------------------------------------------------------------------------- Physical Exam Details Patient Name: Vanhoose, Stevie L. Date of Service: 08/29/2014 11:30 AM Medical Record Number: [id number] Patient Account Number: [id number] Date of Birth/Sex: Jan 28, 1920 (81 y.o. Male) Treating RN: Primary Care Physician: Lindwood Qua Other Clinician: Referring Physician: Lindwood Qua Treating Physician/Extender: Rudene Re in Treatment: 17 Constitutional . Pulse regular. Respirations normal and unlabored. Afebrile. . Eyes Nonicteric. Reactive to light. Ears, Nose, Mouth, and Throat Lips, teeth, and gums WNL.Marland Kitchen Moist mucosa without lesions . Neck supple and nontender. No palpable supraclavicular or cervical adenopathy. Normal sized without goiter. Respiratory WNL. No retractions.. Cardiovascular Pedal Pulses WNL. No clubbing, cyanosis or edema. Chest Breasts symmetical and no nipple discharge.. Breast tissue WNL, no masses, lumps, or tenderness.. Lymphatic No adneopathy. No adenopathy. No adenopathy. Musculoskeletal Adexa without tenderness or enlargement.. Digits and nails w/o clubbing, cyanosis, infection, petechiae, ischemia, or inflammatory conditions.. Integumentary (Hair, Skin) No suspicious lesions. No crepitus or fluctuance. No peri-wound warmth or erythema. No masses.Marland Kitchen Psychiatric Judgement and insight Intact.. No evidence of depression, anxiety, or agitation.. Notes The area was posterior ankle has some debris towards the lower part of his wound but the upper part is granulating  well. There is maceration on the lateral part of his calcaneum possibly due to moisture collecting under the plastic drape. The toe has some debris and in the depth of it there is probing to bone. Electronic Signature(s) Signed: 08/29/2014 11:53:36 AM By: Evlyn Kanner MD, FACS Entered By: Evlyn Kanner on 08/29/2014 11:53:36 Pizzini, [name] ([id number]) -------------------------------------------------------------------------------- Physician Orders Details Patient Name: Sipp, Arslan L. Date of Service: 08/29/2014 11:30 AM Medical Record Number: [id number] Patient Account Number: [id number] Date of Birth/Sex: Jul 31, 1920 (81 y.o. Male) Treating RN: Huel Coventry Primary Care Physician: Lindwood Qua Other Clinician: Referring Physician: Lindwood Qua Treating Physician/Extender: Rudene Re in Treatment: 66 Verbal / Phone Orders: Yes Clinician: Huel Coventry Read Back and Verified: Yes Diagnosis Coding Wound Cleansing Wound #1 Left Achilles o Clean wound with Normal Saline. o May Shower, gently pat wound dry prior to applying new dressing. o May shower with protection. Wound #2 Left,Lateral Calcaneous o Clean wound with Normal Saline. o May Shower, gently pat wound dry prior to applying new dressing. o May shower with protection. Wound #3 Left Toe Great o Clean wound with Normal Saline. o May Shower, gently pat wound dry prior to applying new dressing. o May shower with protection. Anesthetic Wound #1 Left Achilles o Topical Lidocaine 4% cream applied to wound bed prior to debridement Wound #2 Left,Lateral Calcaneous o Topical Lidocaine 4% cream applied to wound bed prior to debridement Wound #3 Left Toe Great o Topical Lidocaine 4% cream applied to wound bed prior to debridement Skin Barriers/Peri-Wound Care Wound #1 Left Achilles o Skin Prep Wound #2 Left,Lateral Calcaneous o Skin Prep Wound #3 Left Toe Great o Skin Prep Primary Wound  Dressing Hascall, Ekin L. ([id number]) Wound #3 Left Toe Great o Prisma Ag Wound #1 Left Achilles o Aquacel Ag Wound #2 Left,Lateral Calcaneous o Aquacel Ag Secondary Dressing Wound #1 Left Achilles o Mepitel One Wound #2 Left,Lateral Calcaneous o Boardered Foam Dressing Wound #3 Left Toe Great o Gauze and Kerlix/Conform Dressing Change  Frequency Wound #1 Left Achilles o Change Dressing Monday, Wednesday, Friday Wound #2 Left,Lateral Calcaneous o Change Dressing Monday, Wednesday, Friday Wound #3 Left Toe Great o Change Dressing Monday, Wednesday, Friday Follow-up Appointments Wound #1 Left Achilles o Return Appointment in 1 week. Wound #2 Left,Lateral Calcaneous o Return Appointment in 1 week. Wound #3 Left Toe Great o Return Appointment in 1 week. Home Health Wound #1 Left Achilles o Continue Home Health Visits - [facility] - To change Wednesday, Friday. o Continue Home Health Visits - [facility] - To change Wednesday, Friday. o Home Health Nurse may visit PRN to address patientos wound care needs. o Home Health Nurse may visit PRN to address patientos wound care needs. o FACE TO FACE ENCOUNTER: MEDICARE and MEDICAID PATIENTS: I certify that this patient is under my care and that I had a face-to-face encounter that meets the physician face-to-face encounter requirements with this patient on this date. The encounter with the patient was in [place], BAYLER GEHRIG. ([id number]) whole or in part for the following MEDICAL CONDITION: (primary reason for Home Healthcare) MEDICAL NECESSITY: I certify, that based on my findings, NURSING services are a medically necessary home health service. HOME BOUND STATUS: I certify that my clinical findings support that this patient is homebound (i.e., Due to illness or injury, pt requires aid of supportive devices such as crutches, cane, wheelchairs, walkers, the use of special transportation or the assistance of another  person to leave their place of residence. There is a normal inability to leave the home and doing so requires considerable and taxing effort. Other absences are for medical reasons / religious services and are infrequent or of short duration when for other reasons). o FACE TO FACE ENCOUNTER: MEDICARE and MEDICAID PATIENTS: I certify that this patient is under my care and that I had a face-to-face encounter that meets the physician face-to-face encounter requirements with this patient on this date. The encounter with the patient was in whole or in part for the following MEDICAL CONDITION: (primary reason for Home Healthcare) MEDICAL NECESSITY: I certify, that based on my findings, NURSING services are a medically necessary home health service. HOME BOUND STATUS: I certify that my clinical findings support that this patient is homebound (i.e., Due to illness or injury, pt requires aid of supportive devices such as crutches, cane, wheelchairs, walkers, the use of special transportation or the assistance of another person to leave their place of residence. There is a normal inability to leave the home and doing so requires considerable and taxing effort. Other absences are for medical reasons / religious services and are infrequent or of short duration when for other reasons). o If current dressing causes regression in wound condition, may D/C ordered dressing product/s and apply Normal Saline Moist Dressing daily until next Wound Healing Center / Other MD appointment. Notify Wound Healing Center of regression in wound condition at [phone]. o If current dressing causes regression in wound condition, may D/C ordered dressing product/s and apply Normal Saline Moist Dressing daily until next Wound Healing Center / Other MD appointment. Notify Wound Healing Center of regression in wound condition at [phone]. o Please direct any NON-WOUND related issues/requests for orders to  patient's Primary Care Physician o Please direct any NON-WOUND related issues/requests for orders to patient's Primary Care Physician Wound #2 Left,Lateral Calcaneous o Continue Home Health Visits - [facility] - To change Wednesday, Friday. o Continue Home Health Visits - [facility] - To change Wednesday, Friday. o Home Health Nurse may visit PRN  to address patientos wound care needs. o Home Health Nurse may visit PRN to address patientos wound care needs. o FACE TO FACE ENCOUNTER: MEDICARE and MEDICAID PATIENTS: I certify that this patient is under my care and that I had a face-to-face encounter that meets the physician face-to-face encounter requirements with this patient on this date. The encounter with the patient was in whole or in part for the following MEDICAL CONDITION: (primary reason for Home Healthcare) MEDICAL NECESSITY: I certify, that based on my findings, NURSING services are a medically necessary home health service. HOME BOUND STATUS: I certify that my clinical findings support that this patient is homebound (i.e., Due to illness or injury, pt requires aid of supportive devices such as crutches, cane, wheelchairs, walkers, the use of special transportation or the assistance of another person to leave their place of residence. There is a normal inability to leave the home and doing so requires considerable and taxing effort. Other absences are for medical reasons / religious services and are infrequent or of short duration when for other reasons). JAHVIER, ALDEA ([id number]) o FACE TO FACE ENCOUNTER: MEDICARE and MEDICAID PATIENTS: I certify that this patient is under my care and that I had a face-to-face encounter that meets the physician face-to-face encounter requirements with this patient on this date. The encounter with the patient was in whole or in part for the following MEDICAL CONDITION: (primary reason for Home Healthcare) MEDICAL NECESSITY: I certify,  that based on my findings, NURSING services are a medically necessary home health service. HOME BOUND STATUS: I certify that my clinical findings support that this patient is homebound (i.e., Due to illness or injury, pt requires aid of supportive devices such as crutches, cane, wheelchairs, walkers, the use of special transportation or the assistance of another person to leave their place of residence. There is a normal inability to leave the home and doing so requires considerable and taxing effort. Other absences are for medical reasons / religious services and are infrequent or of short duration when for other reasons). o If current dressing causes regression in wound condition, may D/C ordered dressing product/s and apply Normal Saline Moist Dressing daily until next Wound Healing Center / Other MD appointment. Notify Wound Healing Center of regression in wound condition at [phone]. o If current dressing causes regression in wound condition, may D/C ordered dressing product/s and apply Normal Saline Moist Dressing daily until next Wound Healing Center / Other MD appointment. Notify Wound Healing Center of regression in wound condition at [phone]. o Please direct any NON-WOUND related issues/requests for orders to patient's Primary Care Physician o Please direct any NON-WOUND related issues/requests for orders to patient's Primary Care Physician Wound #3 Left Toe Great o Continue Home Health Visits - [facility] - To change Wednesday, Friday. o Continue Home Health Visits - [facility] - To change Wednesday, Friday. o Home Health Nurse may visit PRN to address patientos wound care needs. o Home Health Nurse may visit PRN to address patientos wound care needs. o FACE TO FACE ENCOUNTER: MEDICARE and MEDICAID PATIENTS: I certify that this patient is under my care and that I had a face-to-face encounter that meets the physician face-to-face encounter requirements with  this patient on this date. The encounter with the patient was in whole or in part for the following MEDICAL CONDITION: (primary reason for Home Healthcare) MEDICAL NECESSITY: I certify, that based on my findings, NURSING services are a medically necessary home health service. HOME BOUND STATUS: I certify  that my clinical findings support that this patient is homebound (i.e., Due to illness or injury, pt requires aid of supportive devices such as crutches, cane, wheelchairs, walkers, the use of special transportation or the assistance of another person to leave their place of residence. There is a normal inability to leave the home and doing so requires considerable and taxing effort. Other absences are for medical reasons / religious services and are infrequent or of short duration when for other reasons). o FACE TO FACE ENCOUNTER: MEDICARE and MEDICAID PATIENTS: I certify that this patient is under my care and that I had a face-to-face encounter that meets the physician face-to-face encounter requirements with this patient on this date. The encounter with the patient was in whole or in part for the following MEDICAL CONDITION: (primary reason for Home Healthcare) MEDICAL NECESSITY: I certify, that based on my findings, NURSING services are a medically necessary home health service. HOME BOUND STATUS: I certify that my clinical findings support that this patient is homebound (i.e., Due to illness or injury, pt requires aid of supportive devices such as crutches, cane, wheelchairs, walkers, the use of special transportation or the assistance of another person to leave their place of residence. There is a normal inability to leave the home and doing so requires considerable and taxing effort. Other Mcqueary, Jaxxen L. ([id number]) absences are for medical reasons / religious services and are infrequent or of short duration when for other reasons). o If current dressing causes regression in wound  condition, may D/C ordered dressing product/s and apply Normal Saline Moist Dressing daily until next Wound Healing Center / Other MD appointment. Notify Wound Healing Center of regression in wound condition at [phone]. o If current dressing causes regression in wound condition, may D/C ordered dressing product/s and apply Normal Saline Moist Dressing daily until next Wound Healing Center / Other MD appointment. Notify Wound Healing Center of regression in wound condition at [phone]. o Please direct any NON-WOUND related issues/requests for orders to patient's Primary Care Physician o Please direct any NON-WOUND related issues/requests for orders to patient's Primary Care Physician Negative Pressure Wound Therapy Wound #1 Left Achilles o Wound VAC settings at 125/130 mmHg continuous pressure. Use BLACK/GREEN foam to wound cavity. Use WHITE foam to fill any tunnel/s and/or undermining. Change VAC dressing 3 X WEEK. Change canister as indicated when full. Nurse may titrate settings and frequency of dressing changes as clinically indicated. o Home Health Nurse may d/c VAC for s/s of increased infection, significant wound regression, or uncontrolled drainage. Notify Wound Healing Center at [phone]. o Apply contact layer over base of wound. o Number of foam/gauze pieces used in the dressing = o Other: - Apply Aquacel AG to lower half of wound bed and contact layer under foam. Electronic Signature(s) Signed: 08/29/2014 1:11:05 PM By: Evlyn Kanner MD, FACS Signed: 08/29/2014 5:20:17 PM By: Elliot Gurney RN, BSN, Kim RN, BSN Entered By: Elliot Gurney, RN, BSN, Kim on 08/29/2014 11:49:19 JONATHAN, CORPUS ([id number]) -------------------------------------------------------------------------------- Prescription 08/29/2014 Patient Name: Mahaney, [name] Physician: Evlyn Kanner MD Date of Birth: August 18, 1920 NPI#: [id number] Sex: Judie Petit DEA#: [id number] Phone #: [phone] License  #: Patient Address: [facility] [address] [place], [place] [address] [facility] [address] [place], [place] [address] [phone] Allergies ACE Inhibitors Reaction: chest pain Severity: Severe Physician's Orders Wound VAC settings at 125/130 mmHg continuous pressure. Use BLACK/GREEN foam to wound cavity. Use WHITE foam to fill any tunnel/s  and/or undermining. Change VAC dressing 3 X WEEK. Change canister as indicated when full. Nurse may titrate settings and frequency of dressing changes as clinically indicated. Signature(s): Date(s): Psychologist, prison and probation services) Signed: 08/29/2014 1:11:05 PM By: Evlyn Kanner MD, FACS Signed: 08/29/2014 5:20:17 PM By: Elliot Gurney RN, BSN, Kim RN, BSN Entered By: Elliot Gurney, RN, BSN, Kim on 08/29/2014 11:49:21 Sosnowski, [name] ([id number]) --------------------------------------------------------------------------------  Problem List Details Patient Name: [name], [name] Date of Service: 08/29/2014 11:30 AM Medical Record Number: [id number] Patient Account Number: [id number] Date of Birth/Sex: 05/13/1920 (81 y.o. 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 Date of Service: 08/29/2014 11:30 AM Medical Record Number: [id number] Patient Account Number: [id number] Date of Birth/Sex: 1920-07-03 (81 y.o. Male) Treating RN: Primary Care Physician: Lindwood Qua Other Clinician: Referring Physician: Lindwood Qua Treating Physician/Extender: Rudene Re in Treatment: 17 Subjective Chief Complaint Information obtained from Patient Patient presents to the wound care center for a consult due non healing wound 81 year old gentleman who comes with a history of having some ulcerated areas on his heels since February 2016 and then a large injury to his left posterior heel and ankle since about a month. History of Present Illness (HPI) 81 year old gentleman who was known to be a diabetic for many years has peripheral neuropathy recently went to his primary care doctor for a punctured wound on his left heel which was something he had noticed. The patient then was referred to a podiatrist who referred him to Dr. Wyn Quaker in the vascular surgery department and I understand the procedure was done on 03/14/2014 with a left lower extremity vascular procedure and stenting was done. Details of this are not available at the present time. The patient has been applying Neosporin to his leg and has not had any wound care addressed so far. patient has also had a history of coronary artery disease in the past and hasn't had a CABG and a pacemaker placement in the remote past.Other details and notes are pending. the patient is not in pain lives alone and has some home health and other aides coming to help him with his daily chores and meals. reviewing the vascular notes I understand the procedure was done on 03/14/2014 and he was operated by Dr.  Wyn Quaker. he had a catheter placement to the left peroneal artery and a aortogram and selective left lower extremity angiogram was done. He also had a percutaneous transluminal angioplasty of the left peroneal artery and the tibioperoneal trunk. The distal SFA and above-knee popliteal artery were also angioplastied. A subcutaneous stent placement to the distal superficial femoral artery was also done. 05/05/2014 -- the patient has not had any change in his health and after much consideration is decided that he does not want HBOT as he is claustrophobic and was unable to tolerate being in  the chamber for 90 minutes. I have discussed with him that his most recent x-ray of the foot shows that he has the distal phalanx of the left great toe showing changes with osteomyelitis cannot be excluded at that site. He tells me that he cannot have an MRI because of his defibrillator and hence we will order a triple phase bone scan. I have also reviewed his culture report which shows several organisms and he has sensitivity to tetracycline and we have recommended he takes this for 14 days and this has been given to him on 05/02/2014 05/12/2014 the bone scan done on 05/10/2014 shows #1 findings are worrisome for osteomyelitis involving the calcaneus of the left foot, #2 increase uptake localizing to the left second and third toe on all 3 phases. Cannot rule out osteomyelitis in this area. And #3 left foot cellulitis. 05/19/2014 -- reviewed several reports which we have received back on Everrett Coombe. #1 chest x-ray done on April 21 shows chronic changes in the left base no acute findings. Mesta, Aarnav L. ([id number]) #2 his CBC is within normal limits. #3 hemoglobin A1c was 8.5%. #4 EKG done on 26 April was within normal limits due to his electronic ventricular pacemaker. 05/26/2014 -- he has had his PICC line placed and is taking vancomycin daily basis. He is to start hyperbaric oxygen therapy on this coming  Monday. 06/09/2014 -- he saw Dr. Sampson Goon yesterday and besides his IV antibiotic I believe a oral antibiotic was also given and this may be Cipro. Patient is feeling fine otherwise does not have any symptoms though his blood pressure this morning in the wound center has been 90/50. We will check his blood pressure in the supine position. 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 Complained of worsening fatigue yesterday after HBO. Feels better today. Has appointment with PCP this afternoon. He wishes to hold off on HBO until next week. No significant pain. No fever or chills. Stable drainage. 06/23/2014 Khyler has taken a break from HBO T and has been feeling a little better. He was seen by the nurse practitioner at his PCPs office and at that time his vitals were stable and they did get some EKG done which was within normal limits. There have sent out some lab work which we still have to receive reports. He is going to see his PCP back this afternoon and will be seeing Dr. Sampson Goon tomorrow for review. addendum: we have received labs from his PCPs office and note that his potassium is high at 5.4 and his BUN/creatinine is slightly raised. His blood sugar was 216. His total protein and albumen were 5.9 and 3 and this was low. His HandH was 10.3 and 31.4 WBC was 8.2 and his platelets were 304. CRP was 29.6 which was high. Vancomycin trough was 19.1 which was normal TSH was 3.11 which was normal 06/30/2014 -- Eliberto Ivory feels weak today and he feels his stomach is acting up and his not feeling up to doing hyperbaric oxygen therapy. He would like to take a break today and tomorrow and resume on Monday. He is on vancomycin and Levaquin as per ID and he has an appointment to see the vascular surgeons in about 10 days' time. 07/07/2014 -- Eliberto Ivory has been feeling weak all along and he has decided that he is not going to continue with hyperbaric oxygen therapy  and a longer period. He had finished 16 of his 40 treatments and will not take  anymore. He has an appointment with ID tomorrow and also his vascular surgeons and I will have a conference with both of them regarding his further care. 07/14/2014 -- over the last week I have contacted Dr. Sampson Goon and Dr. Wyn Quaker and discussed his care. Dr. Wyn Quaker agrees that he will take him to the OR and debrided his wound and use a wound VAC. Dr. Sampson Goon has altered his anti-biotic regimen - vancomycin has been stopped and he is on oral levofloxacin. The patient's surgery is scheduled for July 7. 07/21/2014 -- he continues to feel unwell and has a lot of GI disturbances to probably his antibiotics. He also has developed a new blister on his left fourth toe and is concerned about his upcoming surgery which is scheduled for next Thursday. 08/04/2014 -- he was taken to the OR on 07/28/2014 and a debridement of the left heel was done of skin and soft tissue and the exposed calcaneum to remove all nonviable tissue. A wound VAC was then applied at the end of the procedure. RAQUON, MILLEDGE ([id number]) 08/12/2014 -- he was seen by Dr. Sampson Goon today and he is going to continue one more week of Levaquin and then stop it and see him back in 3 weeks' time. I have reviewed his notes from today. 08/29/2014 -- he saw Dr. Wyn Quaker his surgeon who did not have any other suggestions except for me to proceed with the skin substitute when ready. Objective Constitutional Pulse regular. Respirations normal and unlabored. Afebrile. Vitals Time Taken: 11:21 AM, Height: 70 in, Weight: 200 lbs, BMI: 28.7, Pulse: 87 bpm, Respiratory Rate: 20 breaths/min, Blood Pressure: 145/46 mmHg. Eyes Nonicteric. Reactive to light. Ears, Nose, Mouth, and Throat Lips, teeth, and gums WNL.Marland Kitchen Moist mucosa without lesions . Neck supple and nontender. No palpable supraclavicular or cervical adenopathy. Normal sized without goiter. Respiratory WNL. No  retractions.. Cardiovascular Pedal Pulses WNL. No clubbing, cyanosis or edema. Chest Breasts symmetical and no nipple discharge.. Breast tissue WNL, no masses, lumps, or tenderness.. Lymphatic No adneopathy. No adenopathy. No adenopathy. Musculoskeletal Adexa without tenderness or enlargement.. Digits and nails w/o clubbing, cyanosis, infection, petechiae, ischemia, or inflammatory conditions.Marland Kitchen Psychiatric Judgement and insight Intact.. No evidence of depression, anxiety, or agitation.. General Notes: The area was posterior ankle has some debris towards the lower part of his wound but the Vangorder, Osamah L. ([id number]) upper part is granulating well. There is maceration on the lateral part of his calcaneum possibly due to moisture collecting under the plastic drape. The toe has some debris and in the depth of it there is probing to bone. Integumentary (Hair, Skin) No suspicious lesions. No crepitus or fluctuance. No peri-wound warmth or erythema. No masses.. Wound #1 status is Open. Original cause of wound was Gradually Appeared. The wound is located on the Left Achilles. The wound measures 6.5cm length x 4.7cm width x 0.3cm depth; 23.994cm^2 area and 7.198cm^3 volume. The wound is limited to skin breakdown. There is no tunneling or undermining noted. There is a large amount of serosanguineous drainage noted. The wound margin is distinct with the outline attached to the wound base. There is large (67-100%) pink granulation within the wound bed. There is a small (1-33%) amount of necrotic tissue within the wound bed including Adherent Slough. The periwound skin appearance exhibited: Maceration, Moist. The periwound skin appearance did not exhibit: Callus, Crepitus, Excoriation, Fluctuance, Friable, Induration, Localized Edema, Rash, Scarring, Dry/Scaly, Atrophie Blanche, Cyanosis, Ecchymosis, Hemosiderin Staining, Mottled, Pallor, Rubor, Erythema. Wound #2 status is Open. Original  cause of  wound was Gradually Appeared. The wound is located on the Left,Lateral Calcaneous. The wound measures 0.5cm length x 0.4cm width x 0.2cm depth; 0.157cm^2 area and 0.031cm^3 volume. The wound is limited to skin breakdown. There is no tunneling or undermining noted. There is a none present amount of drainage noted. There is large (67-100%) pale granulation within the wound bed. There is a small (1-33%) amount of necrotic tissue within the wound bed including Adherent Slough. The periwound skin appearance exhibited: Maceration. The periwound skin appearance did not exhibit: Callus, Crepitus, Excoriation, Fluctuance, Friable, Induration, Localized Edema, Rash, Scarring, Dry/Scaly, Moist, Atrophie Blanche, Cyanosis, Ecchymosis, Hemosiderin Staining, Mottled, Pallor, Rubor, Erythema. Wound #3 status is Open. Original cause of wound was Gradually Appeared. The wound is located on the Left Toe Great. The wound measures 0.5cm length x 0.4cm width x 0.2cm depth; 0.157cm^2 area and 0.031cm^3 volume. The wound is limited to skin breakdown. The wound margin is flat and intact. There is small (1-33%) pale granulation within the wound bed. There is a large (67-100%) amount of necrotic tissue within the wound bed including Adherent Slough. The periwound skin appearance exhibited: Dry/Scaly, Moist. 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 ([id number]) We will use some silver alginate over his Achilles heel under the foam. He will continue with his wound VAC and the appropriate dressing to his calcaneum and toe. we will continue the wound  VAC for a while before we plan to apply a skin substitute. The option of application of Acell or a split thickness skin graft has been discussed with him but this will require going to the OR.He will come back to see me next week. 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 There was a Skin/Subcutaneous Tissue Debridement ([id number]) debridement with total area of 12 sq cm performed by Aidee Latimore, Ignacia Felling., MD. with the following instrument(s): Curette to remove Viable and Non-Viable tissue/material including Exudate, Fibrin/Slough, and Subcutaneous after achieving pain control using Other (lidocaine 4%). A time out was conducted prior to the start of the procedure. A Minimum amount of bleeding was controlled with Pressure. The procedure was tolerated well with a pain level of 0 throughout and a pain level of 0 following the procedure. Post Debridement Measurements: 6.5cm length x 4.7cm width x 0.4cm depth; 9.598cm^3 volume. Wound #2 Wound #2 is an Arterial Insufficiency Ulcer located on the Left,Lateral Calcaneous . There was a Skin/Subcutaneous Tissue Debridement ([id number]) debridement with total area of 0.2 sq cm performed by Laurier Jasperson, Ignacia Felling., MD. with the following instrument(s): Curette to remove Viable and Non-Viable tissue/material including Exudate, Fibrin/Slough, and Subcutaneous after achieving pain control using Other (lidocaine 4%). A time out was conducted prior to the start of the procedure. A Minimum amount of bleeding was controlled with Pressure. The procedure was tolerated well with a pain level of 0 throughout and a pain level of 0 following the procedure. Post Debridement Measurements: 0.5cm length x 0.4cm width x 0.2cm depth; 0.031cm^3 volume. Wound #3 Wound #3 is an Arterial Insufficiency Ulcer located on the Left Toe Great . There was a Skin/Subcutaneous Tissue Debridement ([id number]) debridement with  total area of  0.2 sq cm performed by Robbyn Hodkinson, Ignacia Felling., MD. with the following instrument(s): Curette to remove Viable and Non-Viable tissue/material including Exudate, Fibrin/Slough, and Subcutaneous after achieving pain control using Other (lidocaine 4%). A time out was conducted prior to the start of the procedure. A Minimum amount of bleeding was controlled with Pressure. The procedure was tolerated well with a pain level of 0 throughout and a pain level of 0 following the procedure. Post Debridement Measurements: 0.5cm length x 0.4cm width x 0.3cm depth; 0.047cm^3 volume. Plan Wound Cleansing: Wound #1 Left Achilles: Clean wound with Normal Saline. TAVARES, LEVINSON ([id number]) May Shower, gently pat wound dry prior to applying new dressing. May shower with protection. Wound #2 Left,Lateral Calcaneous: Clean wound with Normal Saline. May Shower, gently pat wound dry prior to applying new dressing. May shower with protection. Wound #3 Left Toe Great: Clean wound with Normal Saline. May Shower, gently pat wound dry prior to applying new dressing. May shower with protection. Anesthetic: Wound #1 Left Achilles: Topical Lidocaine 4% cream applied to wound bed prior to debridement Wound #2 Left,Lateral Calcaneous: Topical Lidocaine 4% cream applied to wound bed prior to debridement Wound #3 Left Toe Great: Topical Lidocaine 4% cream applied to wound bed prior to debridement Skin Barriers/Peri-Wound Care: Wound #1 Left Achilles: Skin Prep Wound #2 Left,Lateral Calcaneous: Skin Prep Wound #3 Left Toe Great: Skin Prep Primary Wound Dressing: Wound #3 Left Toe Great: Prisma Ag Wound #1 Left Achilles: Aquacel Ag Wound #2 Left,Lateral Calcaneous: Aquacel Ag Secondary Dressing: Wound #1 Left Achilles: Mepitel One Wound #2 Left,Lateral Calcaneous: Boardered Foam Dressing Wound #3 Left Toe Great: Gauze and Kerlix/Conform Dressing Change Frequency: Wound #1 Left Achilles: Change Dressing  Monday, Wednesday, Friday Wound #2 Left,Lateral Calcaneous: Change Dressing Monday, Wednesday, Friday Wound #3 Left Toe Great: Change Dressing Monday, Wednesday, Friday Follow-up Appointments: Wound #1 Left Achilles: Return Appointment in 1 week. Wound #2 Left,Lateral Calcaneous: Return Appointment in 1 week. Wound #3 Left Toe GreatOZIAH, [name] ([id number]) Return Appointment in 1 week. Home Health: Wound #1 Left Achilles: Continue Home Health Visits - [facility] - To change Wednesday, Friday. Continue Home Health Visits - [facility] - To change Wednesday, Friday. Home Health Nurse may visit PRN to address patient s wound care needs. Home Health Nurse may visit [facility] to address patient s wound care needs. FACE TO FACE ENCOUNTER: MEDICARE and MEDICAID PATIENTS: I certify that this patient is under my care and that I had a face-to-face encounter that meets the physician face-to-face encounter requirements with this patient on this date. The encounter with the patient was in whole or in part for the following MEDICAL CONDITION: (primary reason for Home Healthcare) MEDICAL NECESSITY: I certify, that based on my findings, NURSING services are a medically necessary home health service. HOME BOUND STATUS: I certify that my clinical findings support that this patient is homebound (i.e., Due to illness or injury, pt requires aid of supportive devices such as crutches, cane, wheelchairs, walkers, the use of special transportation or the assistance of another person to leave their place of residence. There is a normal inability to leave the home and doing so requires considerable and taxing effort. Other absences are for medical reasons / religious services and are infrequent or of short duration when for other reasons). FACE TO FACE ENCOUNTER: MEDICARE and MEDICAID PATIENTS: I certify that this patient is under my care and that I had a face-to-face encounter that meets the physician face-to-face  encounter  requirements with this patient on this date. The encounter with the patient was in whole or in part for the following MEDICAL CONDITION: (primary reason for Home Healthcare) MEDICAL NECESSITY: I certify, that based on my findings, NURSING services are a medically necessary home health service. HOME BOUND STATUS: I certify that my clinical findings support that this patient is homebound (i.e., Due to illness or injury, pt requires aid of supportive devices such as crutches, cane, wheelchairs, walkers, the use of special transportation or the assistance of another person to leave their place of residence. There is a normal inability to leave the home and doing so requires considerable and taxing effort. Other absences are for medical reasons / religious services and are infrequent or of short duration when for other reasons). If current dressing causes regression in wound condition, may D/C ordered dressing product/s and apply Normal Saline Moist Dressing daily until next Wound Healing Center / Other MD appointment. Notify Wound Healing Center of regression in wound condition at [phone]. If current dressing causes regression in wound condition, may D/C ordered dressing product/s and apply Normal Saline Moist Dressing daily until next Wound Healing Center / Other MD appointment. Notify Wound Healing Center of regression in wound condition at [phone]. Please direct any NON-WOUND related issues/requests for orders to patient's Primary Care Physician Please direct any NON-WOUND related issues/requests for orders to patient's Primary Care Physician Wound #2 Left,Lateral [place]: Continue Home Health Visits - [facility] - To change Wednesday, Friday. Continue Home Health Visits - [facility] - To change Wednesday, Friday. Home Health Nurse may visit PRN to address patient s wound care needs. Home Health Nurse may visit [facility] to address patient s wound care needs. FACE TO FACE ENCOUNTER:  MEDICARE and MEDICAID PATIENTS: I certify that this patient is under my care and that I had a face-to-face encounter that meets the physician face-to-face encounter requirements with this patient on this date. The encounter with the patient was in whole or in part for the following MEDICAL CONDITION: (primary reason for Home Healthcare) MEDICAL NECESSITY: I certify, that based on my findings, NURSING services are a medically necessary home health service. HOME BOUND STATUS: I certify that my clinical findings support that this patient is homebound (i.e., Due to illness or injury, pt requires aid of supportive devices such as crutches, cane, wheelchairs, walkers, the use of special transportation or the assistance of another person to leave their place of residence. There is a normal inability to leave the home and doing so requires considerable and taxing effort. Other absences are for medical reasons / religious services and are infrequent or of short duration when for other reasons). FACE TO FACE ENCOUNTER: MEDICARE and MEDICAID PATIENTS: I certify that this patient is under [name], [name] ([id number]) my care and that I had a face-to-face encounter that meets the physician face-to-face encounter requirements with this patient on this date. The encounter with the patient was in whole or in part for the following MEDICAL CONDITION: (primary reason for Home Healthcare) MEDICAL NECESSITY: I certify, that based on my findings, NURSING services are a medically necessary home health service. HOME BOUND STATUS: I certify that my clinical findings support that this patient is homebound (i.e., Due to illness or injury, pt requires aid of supportive devices such as crutches, cane, wheelchairs, walkers, the use of special transportation or the assistance of another person to leave their place of residence. There is a normal inability to leave the home and doing so requires considerable and  taxing effort.  Other absences are for medical reasons / religious services and are infrequent or of short duration when for other reasons). If current dressing causes regression in wound condition, may D/C ordered dressing product/s and apply Normal Saline Moist Dressing daily until next Wound Healing Center / Other MD appointment. Notify Wound Healing Center of regression in wound condition at [phone]. If current dressing causes regression in wound condition, may D/C ordered dressing product/s and apply Normal Saline Moist Dressing daily until next Wound Healing Center / Other MD appointment. Notify Wound Healing Center of regression in wound condition at [phone]. Please direct any NON-WOUND related issues/requests for orders to patient's Primary Care Physician Please direct any NON-WOUND related issues/requests for orders to patient's Primary Care Physician Wound #3 Left Toe Great: Continue Home Health Visits - [facility] - To change Wednesday, Friday. Continue Home Health Visits - [facility] - To change Wednesday, Friday. Home Health Nurse may visit [facility] to address patient s wound care needs. Home Health Nurse may visit [facility] to address patient s wound care needs. FACE TO FACE ENCOUNTER: MEDICARE and MEDICAID PATIENTS: I certify that this patient is under my care and that I had a face-to-face encounter that meets the physician face-to-face encounter requirements with this patient on this date. The encounter with the patient was in whole or in part for the following MEDICAL CONDITION: (primary reason for Home Healthcare) MEDICAL NECESSITY: I certify, that based on my findings, NURSING services are a medically necessary home health service. HOME BOUND STATUS: I certify that my clinical findings support that this patient is homebound (i.e., Due to illness or injury, pt requires aid of supportive devices such as crutches, cane, wheelchairs, walkers, the use of special transportation or the assistance of  another person to leave their place of residence. There is a normal inability to leave the home and doing so requires considerable and taxing effort. Other absences are for medical reasons / religious services and are infrequent or of short duration when for other reasons). FACE TO FACE ENCOUNTER: MEDICARE and MEDICAID PATIENTS: I certify that this patient is under my care and that I had a face-to-face encounter that meets the physician face-to-face encounter requirements with this patient on this date. The encounter with the patient was in whole or in part for the following MEDICAL CONDITION: (primary reason for Home Healthcare) MEDICAL NECESSITY: I certify, that based on my findings, NURSING services are a medically necessary home health service. HOME BOUND STATUS: I certify that my clinical findings support that this patient is homebound (i.e., Due to illness or injury, pt requires aid of supportive devices such as crutches, cane, wheelchairs, walkers, the use of special transportation or the assistance of another person to leave their place of residence. There is a normal inability to leave the home and doing so requires considerable and taxing effort. Other absences are for medical reasons / religious services and are infrequent or of short duration when for other reasons). If current dressing causes regression in wound condition, may D/C ordered dressing product/s and apply Normal Saline Moist Dressing daily until next Wound Healing Center / Other MD appointment. Notify Wound Healing Center of regression in wound condition at [phone]. If current dressing causes regression in wound condition, may D/C ordered dressing product/s and apply Normal Saline Moist Dressing daily until next Wound Healing Center / Other MD appointment. Notify Wound Healing Center of regression in wound condition at [phone]. Please direct any NON-WOUND related issues/requests for orders to patient's Primary  Care Physician Please direct any NON-WOUND related issues/requests for orders to patient's Primary Care Physician Negative Pressure Wound Therapy: [name], [name] ([id number]) Wound #1 Left Achilles: Wound VAC settings at 125/130 mmHg continuous pressure. Use BLACK/GREEN foam to wound cavity. Use WHITE foam to fill any tunnel/s and/or undermining. Change VAC dressing 3 X WEEK. Change canister as indicated when full. Nurse may titrate settings and frequency of dressing changes as clinically indicated. Home Health Nurse may d/c VAC for s/s of increased infection, significant wound regression, or uncontrolled drainage. Notify Wound Healing Center at [phone]. Apply contact layer over base of wound. Number of foam/gauze pieces used in the dressing = Other: - Apply Aquacel AG to lower half of wound bed and contact layer under foam. We will use some silver alginate over his Achilles heel under the foam. He will continue with his wound VAC and the appropriate dressing to his calcaneum and toe. we will continue the wound VAC for a while before we plan to apply a skin substitute. The option of application of Acell or a split thickness skin graft has been discussed with him but this will require going to the OR.He will come back to see me next week. Electronic Signature(s) Signed: 08/29/2014 11:56:26 AM By: Evlyn Kanner MD, FACS Entered By: Evlyn Kanner on 08/29/2014 11:56:26 Larcom, [name] ([id number]) -------------------------------------------------------------------------------- SuperBill Details Patient Name: [name], [name] Date of Service: 08/29/2014 Medical Record Number: [id number] Patient Account Number: [id number] Date of Birth/Sex: Jul 17, 1920 (81 y.o. 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 Chronic multifocal osteomyelitis, left ankle and foot Facility Procedures CPT4: Description Modifier Quantity Code [id number] 11042 - DEB SUBQ TISSUE 20 SQ CM/< 1 ICD-10 Description Diagnosis E11.621 Type 2 diabetes mellitus with foot ulcer 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

## 2014-09-05 ENCOUNTER — Encounter: Payer: Medicare Other | Admitting: Surgery

## 2014-09-05 DIAGNOSIS — L97323 Non-pressure chronic ulcer of left ankle with necrosis of muscle: Secondary | ICD-10-CM | POA: Diagnosis not present

## 2014-09-06 NOTE — Progress Notes (Addendum)
[name], [name] ([id number]) Visit Report for 09/05/2014 Chief Complaint Document Details Patient Name: [name], [name] Date of Service: 09/05/2014 11:30 AM Medical Record Number: [id number] Patient Account Number: [id number] Date of Birth/Sex: 06-27-1920 (81 y.o. Male) Treating RN: Curtis Sites Primary Care Physician: Lindwood Qua Other Clinician: Referring Physician: Lindwood Qua Treating Physician/Extender: Rudene Re in Treatment: 18 Information Obtained from: Patient Chief Complaint Patient presents to the wound care center for a consult due non healing wound 81 year old gentleman who comes with a history of having some ulcerated areas on his heels since February 2016 and then a large injury to his left posterior heel and ankle since about a month. Electronic Signature(s) Signed: 09/05/2014 12:20:23 PM By: Evlyn Kanner MD, FACS Entered By: Evlyn Kanner on 09/05/2014 12:20:22 [name], [name] ([id number]) -------------------------------------------------------------------------------- Debridement Details Patient Name: [name], [name] Date of Service: 09/05/2014 11:30 AM Medical Record Number: [id number] Patient Account Number: [id number] Date of Birth/Sex: 1920-01-28 (81 y.o. Male) Treating RN: Curtis Sites Primary Care Physician: Lindwood Qua Other Clinician: Referring Physician: Lindwood Qua Treating Physician/Extender: Rudene Re in Treatment: 18 Debridement Performed for Wound #1 Left Achilles Assessment: Performed By: Physician Tristan Schroeder., MD Debridement: Debridement Pre-procedure Yes Verification/Time Out Taken: Start Time: 12:05 Pain Control: Other : lidocaine 4% Level: Skin/Subcutaneous Tissue Total Area Debrided (L x 3 (cm) x 4 (cm) = 12 (cm) W): Tissue and other Non-Viable, Eschar, Exudate, Fibrin/Slough, Subcutaneous material debrided: Instrument: Forceps Bleeding: None End Time: 12:06 Procedural Pain: 0 Post  Procedural Pain: 0 Response to Treatment: Procedure was tolerated well Post Debridement Measurements of Total Wound Length: (cm) 7 Width: (cm) 4.5 Depth: (cm) 0.3 Volume: (cm) 7.422 Electronic Signature(s) Signed: 09/05/2014 12:19:59 PM By: Evlyn Kanner MD, FACS Signed: 09/05/2014 5:54:31 PM By: Curtis Sites Entered By: Evlyn Kanner on 09/05/2014 12:19:59 [name], [name] ([id number]) -------------------------------------------------------------------------------- Debridement Details Patient Name: [name], [name] Date of Service: 09/05/2014 11:30 AM Medical Record Number: [id number] Patient Account Number: [id number] Date of Birth/Sex: [birth year] (81 y.o. Male) Treating RN: Curtis Sites Primary Care Physician: Lindwood Qua Other Clinician: Referring Physician: Lindwood Qua Treating Physician/Extender: Rudene Re in Treatment: 18 Debridement Performed for Wound #3 Left Toe Great Assessment: Performed By: Physician Tristan Schroeder., MD Debridement: Debridement Pre-procedure Yes Verification/Time Out Taken: Start Time: 12:05 Pain Control: Other : lidocaine 4% Level: Skin/Subcutaneous Tissue Total Area Debrided (L x 0.5 (cm) x 0.4 (cm) = 0.2 (cm) W): Tissue and other Non-Viable, Eschar, Exudate, Fibrin/Slough, Subcutaneous material debrided: Instrument: Forceps Bleeding: None End Time: 12:06 Procedural Pain: 0 Post Procedural Pain: 0 Response to Treatment: Procedure was tolerated well Post Debridement Measurements of Total Wound Length: (cm) 0.5 Width: (cm) 0.4 Depth: (cm) 0.2 Volume: (cm) 0.031 Electronic Signature(s) Signed: 09/05/2014 12:20:16 PM By: Evlyn Kanner MD, FACS Signed: 09/05/2014 5:54:31 PM By: Curtis Sites Entered By: Evlyn Kanner on 09/05/2014 12:20:16 [name], [name] ([id number]) -------------------------------------------------------------------------------- HPI Details Patient Name: [name], [name] Date of Service:  09/05/2014 11:30 AM Medical Record Number: [id number] Patient Account Number: [id number] Date of Birth/Sex: [birth year] (81 y.o. Male) Treating RN: Curtis Sites Primary Care Physician: Lindwood Qua Other Clinician: Referring Physician: Lindwood Qua Treating Physician/Extender: Rudene Re in Treatment: 18 History of Present Illness HPI Description: 81 year old gentleman who was known to be a diabetic for many years has peripheral neuropathy recently went to his primary care doctor for a punctured wound on his left heel which was something he had noticed. The patient then was referred to a podiatrist who referred  him to Dr. Wyn Quaker in the vascular surgery department and I understand the procedure was done on 03/14/2014 with a left lower extremity vascular procedure and stenting was done. Details of this are not available at the present time. The patient has been applying Neosporin to his leg and has not had any wound care addressed so far. patient has also had a history of coronary artery disease in the past and hasn't had a CABG and a pacemaker placement in the remote past.Other details and notes are pending. the patient is not in pain lives alone and has some home health and other aides coming to help him with his daily chores and meals. reviewing the vascular notes I understand the procedure was done on 03/14/2014 and he was operated by Dr. Wyn Quaker. he had a catheter placement to the left peroneal artery and a aortogram and selective left lower extremity angiogram was done. He also had a percutaneous transluminal angioplasty of the left peroneal artery and the tibioperoneal trunk. The distal SFA and above-knee popliteal artery were also angioplastied. A subcutaneous stent placement to the distal superficial femoral artery was also done. 05/05/2014 -- the patient has not had any change in his health and after much consideration is decided that he does not want HBOT as he is  claustrophobic and was unable to tolerate being in the chamber for 90 minutes. I have discussed with him that his most recent x-ray of the foot shows that he has the distal phalanx of the left great toe showing changes with osteomyelitis cannot be excluded at that site. He tells me that he cannot have an MRI because of his defibrillator and hence we will order a triple phase bone scan. I have also reviewed his culture report which shows several organisms and he has sensitivity to tetracycline and we have recommended he takes this for 14 days and this has been given to him on 05/02/2014 05/12/2014 the bone scan done on 05/10/2014 shows #1 findings are worrisome for osteomyelitis involving the calcaneus of the left foot, #2 increase uptake localizing to the left second and third toe on all 3 phases. Cannot rule out osteomyelitis in this area. And #3 left foot cellulitis. 05/19/2014 -- reviewed several reports which we have received back on [facility]. #1 chest x-ray done on April 21 shows chronic changes in the left base no acute findings. #2 his CBC is within normal limits. #3 hemoglobin A1c was 8.5%. #4 EKG done on 26 April was within normal limits due to his electronic ventricular pacemaker. 05/26/2014 -- he has had his PICC line placed and is taking vancomycin daily basis. He is to start hyperbaric oxygen therapy on this coming Monday. 06/09/2014 -- he saw Dr. Sampson Goon yesterday and besides his IV antibiotic I believe a oral antibiotic was RACE, LATOUR. ([id number]) also given and this may be Cipro. Patient is feeling fine otherwise does not have any symptoms though his blood pressure this morning in the wound center has been 90/50. We will check his blood pressure in the supine position. 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 Complained of worsening fatigue yesterday after HBO. Feels better today. Has appointment with PCP this afternoon.  He wishes to hold off on HBO until next week. No significant pain. No fever or chills. Stable drainage. 06/23/2014 Deivi has taken a break from HBO T and has been feeling a little better. He was seen by the nurse practitioner at his PCPs office and  at that time his vitals were stable and they did get some EKG done which was within normal limits. There have sent out some lab work which we still have to receive reports. He is going to see his PCP back this afternoon and will be seeing Dr. Sampson Goon tomorrow for review. addendum: we have received labs from his PCPs office and note that his potassium is high at 5.4 and his BUN/creatinine is slightly raised. His blood sugar was 216. His total protein and albumen were 5.9 and 3 and this was low. His HandH was 10.3 and 31.4 WBC was 8.2 and his platelets were 304. CRP was 29.6 which was high. Vancomycin trough was 19.1 which was normal TSH was 3.11 which was normal 06/30/2014 -- Eliberto Ivory feels weak today and he feels his stomach is acting up and his not feeling up to doing hyperbaric oxygen therapy. He would like to take a break today and tomorrow and resume on Monday. He is on vancomycin and Levaquin as per ID and he has an appointment to see the vascular surgeons in about 10 days' time. 07/07/2014 -- Eliberto Ivory has been feeling weak all along and he has decided that he is not going to continue with hyperbaric oxygen therapy and a longer period. He had finished 16 of his 40 treatments and will not take anymore. He has an appointment with ID tomorrow and also his vascular surgeons and I will have a conference with both of them regarding his further care. 07/14/2014 -- over the last week I have contacted Dr. Sampson Goon and Dr. Wyn Quaker and discussed his care. Dr. Wyn Quaker agrees that he will take him to the OR and debrided his wound and use a wound VAC. Dr. Sampson Goon has altered his anti-biotic regimen - vancomycin has been stopped and he is on oral levofloxacin.  The patient's surgery is scheduled for July 7. 07/21/2014 -- he continues to feel unwell and has a lot of GI disturbances to probably his antibiotics. He also has developed a new blister on his left fourth toe and is concerned about his upcoming surgery which is scheduled for next Thursday. 08/04/2014 -- he was taken to the OR on 07/28/2014 and a debridement of the left heel was done of skin and soft tissue and the exposed calcaneum to remove all nonviable tissue. A wound VAC was then applied at the end of the procedure. 08/12/2014 -- he was seen by Dr. Sampson Goon today and he is going to continue one more week of Levaquin and then stop it and see him back in 3 weeks' time. I have reviewed his notes from today. 08/29/2014 -- he saw Dr. Wyn Quaker his surgeon who did not have any other suggestions except for me to proceed with the skin substitute when ready. 09/05/2014 -- he saw Dr. Sampson Goon last week and he has done some tests and is awaiting his decision regarding putting him on further antibiotics. THEO, KRUMHOLZ ([id number]) Electronic Signature(s) Signed: 09/05/2014 12:20:58 PM By: Evlyn Kanner MD, FACS Entered By: Evlyn Kanner on 09/05/2014 12:20:58 Marrone, [name] ([id number]) -------------------------------------------------------------------------------- Physical Exam Details Patient Name: [name], [name] Date of Service: 09/05/2014 11:30 AM Medical Record Number: [id number] Patient Account Number: [id number] Date of Birth/Sex: 16-Sep-1920 (81 y.o. Male) Treating RN: Curtis Sites Primary Care Physician: Lindwood Qua Other Clinician: Referring Physician: Lindwood Qua Treating Physician/Extender: Rudene Re in Treatment: 18 Constitutional . Pulse regular. Respirations normal and unlabored. Afebrile. . Eyes Nonicteric. Reactive to light. Ears, Nose, Mouth, and Throat Lips, teeth,  and gums WNL.Marland Kitchen Moist mucosa without lesions . Neck supple and nontender. No palpable  supraclavicular or cervical adenopathy. Normal sized without goiter. Respiratory WNL. No retractions.. Cardiovascular Pedal Pulses WNL. No clubbing, cyanosis or edema. Chest Breasts symmetical and no nipple discharge.. Breast tissue WNL, no masses, lumps, or tenderness.. Lymphatic No adneopathy. No adenopathy. No adenopathy. Musculoskeletal Adexa without tenderness or enlargement.. Digits and nails w/o clubbing, cyanosis, infection, petechiae, ischemia, or inflammatory conditions.. Integumentary (Hair, Skin) No suspicious lesions. No crepitus or fluctuance. No peri-wound warmth or erythema. No masses.Marland Kitchen Psychiatric Judgement and insight Intact.. No evidence of depression, anxiety, or agitation.. Notes There still is some slough at the inferior part of the posterior calcaneal wound and there is some maceration of the lateral calcaneal region. The toe has some debris which is sharply debrided and was probing down to bone. Electronic Signature(s) Signed: 09/05/2014 12:21:43 PM By: Evlyn Kanner MD, FACS Entered By: Evlyn Kanner on 09/05/2014 12:21:42 Flaum, [name] ([id number]) -------------------------------------------------------------------------------- Physician Orders Details Patient Name: Mazzeo, Zameer L. Date of Service: 09/05/2014 11:30 AM Medical Record Number: [id number] Patient Account Number: [id number] Date of Birth/Sex: 1920/08/17 (81 y.o. 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 o May Shower, gently pat wound dry prior to applying new dressing. o May shower with protection. Wound #2 Left,Lateral Calcaneous o Clean wound with Normal Saline. o May Shower, gently pat wound dry prior to applying new dressing. o May shower with protection. Wound #3 Left Toe Great o Clean wound with Normal Saline. o May Shower, gently pat wound dry prior to applying new dressing. o May shower with protection. Anesthetic Wound #1 Left Achilles o Topical Lidocaine 4% cream applied to wound bed prior to debridement Wound #2 Left,Lateral Calcaneous o Topical Lidocaine 4% cream applied to wound bed prior to debridement Wound #3 Left Toe Great o Topical Lidocaine 4% cream applied to wound bed prior to debridement Skin Barriers/Peri-Wound Care Wound #1 Left Achilles [name], [name] ([id number]) o Skin Prep Wound #2 Left,Lateral Calcaneous o Skin Prep Wound #3 Left Toe Great o Skin Prep Primary Wound Dressing Wound #1 Left Achilles o Aquacel Ag Wound #2 Left,Lateral Calcaneous o Aquacel Ag Wound #3 Left Toe Great o Prisma Ag Secondary Dressing Wound #1 Left Achilles o Gauze and Kerlix/Conform Wound #2 Left,Lateral Calcaneous o Boardered Foam Dressing Dressing Change Frequency Wound #1 Left Achilles o Change Dressing Monday, Wednesday, Friday Wound #2 Left,Lateral Calcaneous o Change Dressing Monday, Wednesday, Friday Wound #3 Left Toe Great o Change Dressing Monday, Wednesday, Friday Follow-up Appointments Wound #1 Left Achilles o Return Appointment in 1 week. Wound #2 Left,Lateral Calcaneous o Return Appointment in 1 week. Wound #3 Left Toe Great o Return Appointment in 1 week. Home Health Wound #1  Left Achilles [name], [name] ([id number]) o Continue Home Health Visits - [facility] - To change Wednesday, Friday. o Continue Home Health Visits - [facility] - To change Wednesday, Friday.   o Home Health Nurse may visit PRN to address patientos wound care needs. o Home Health Nurse may visit PRN to address patientos wound care needs. o FACE TO FACE ENCOUNTER: MEDICARE and MEDICAID PATIENTS: I certify that this patient is under my care and that I had a face-to-face encounter that meets the physician face-to-face encounter requirements with this patient on this date. The encounter with the patient was in whole or in part for the following MEDICAL CONDITION: (primary reason for Home Healthcare) MEDICAL NECESSITY: I certify, that based on my findings, NURSING services are a medically necessary home health service. HOME BOUND STATUS: I certify that my clinical findings support that this patient is homebound (i.e., Due to illness or injury, pt requires aid of supportive devices such as crutches, cane, wheelchairs, walkers, the use of special transportation or the assistance of another person to leave their place of residence. There is a normal inability to leave the home and doing so requires considerable and taxing effort. Other absences are for medical reasons / religious services and are infrequent or of short duration when for other reasons). o FACE TO FACE ENCOUNTER: MEDICARE and MEDICAID PATIENTS: I certify that this patient is under my care and that I had a face-to-face encounter that meets the physician face-to-face encounter requirements with this patient on this date. The encounter with the patient was in whole or in part for the following MEDICAL CONDITION: (primary reason for Home Healthcare) MEDICAL NECESSITY: I certify, that based on my findings, NURSING services are a medically necessary home health service. HOME BOUND STATUS: I certify that my clinical findings support that  this patient is homebound (i.e., Due to illness or injury, pt requires aid of supportive devices such as crutches, cane, wheelchairs, walkers, the use of special transportation or the assistance of another person to leave their place of residence. There is a normal inability to leave the home and doing so requires considerable and taxing effort. Other absences are for medical reasons / religious services and are infrequent or of short duration when for other reasons). o If current dressing causes regression in wound condition, may D/C ordered dressing product/s and apply Normal Saline Moist Dressing daily until next Wound Healing Center / Other MD appointment. Notify Wound Healing Center of regression in wound condition at [phone]. o If current dressing causes regression in wound condition, may D/C ordered dressing product/s and apply Normal Saline Moist Dressing daily until next Wound Healing Center / Other MD appointment. Notify Wound Healing Center of regression in wound condition at [phone]. o Please direct any NON-WOUND related issues/requests for orders to patient's Primary Care Physician o Please direct any NON-WOUND related issues/requests for orders to patient's Primary Care Physician Wound #2 Left,Lateral Calcaneous o Continue Home Health Visits - [facility] - To change Wednesday, Friday. o Continue Home Health Visits - [facility] - To change Wednesday, Friday. o Home Health Nurse may visit PRN to address patientos wound care needs. o Home Health Nurse may visit PRN to address patientos wound care needs. o FACE TO FACE ENCOUNTER: MEDICARE and MEDICAID PATIENTS: I certify that this patient is under my care and that I had a face-to-face encounter that meets the physician face-to-face encounter requirements with this patient on this date. The encounter with the patient was in whole or in part for the following MEDICAL CONDITION: (primary reason for Home  Healthcare) MEDICAL NECESSITY: I certify, that based on my findings, NURSING services are a medically necessary home health service. HOME BOUND STATUS:  I certify that my clinical findings AKSHATH, MCCAREY. ([id number]) support that this patient is homebound (i.e., Due to illness or injury, pt requires aid of supportive devices such as crutches, cane, wheelchairs, walkers, the use of special transportation or the assistance of another person to leave their place of residence. There is a normal inability to leave the home and doing so requires considerable and taxing effort. Other absences are for medical reasons / religious services and are infrequent or of short duration when for other reasons). o FACE TO FACE ENCOUNTER: MEDICARE and MEDICAID PATIENTS: I certify that this patient is under my care and that I had a face-to-face encounter that meets the physician face-to-face encounter requirements with this patient on this date. The encounter with the patient was in whole or in part for the following MEDICAL CONDITION: (primary reason for Home Healthcare) MEDICAL NECESSITY: I certify, that based on my findings, NURSING services are a medically necessary home health service. HOME BOUND STATUS: I certify that my clinical findings support that this patient is homebound (i.e., Due to illness or injury, pt requires aid of supportive devices such as crutches, cane, wheelchairs, walkers, the use of special transportation or the assistance of another person to leave their place of residence. There is a normal inability to leave the home and doing so requires considerable and taxing effort. Other absences are for medical reasons / religious services and are infrequent or of short duration when for other reasons). o If current dressing causes regression in wound condition, may D/C ordered dressing product/s and apply Normal Saline Moist Dressing daily until next Wound Healing Center / Other  MD appointment. Notify Wound Healing Center of regression in wound condition at [phone]. o If current dressing causes regression in wound condition, may D/C ordered dressing product/s and apply Normal Saline Moist Dressing daily until next Wound Healing Center / Other MD appointment. Notify Wound Healing Center of regression in wound condition at [phone]. o Please direct any NON-WOUND related issues/requests for orders to patient's Primary Care Physician o Please direct any NON-WOUND related issues/requests for orders to patient's Primary Care Physician Wound #3 Left Toe Great o Continue Home Health Visits - [facility] - To change Wednesday, Friday. o Continue Home Health Visits - [facility] - To change Wednesday, Friday. o Home Health Nurse may visit PRN to address patientos wound care needs. o Home Health Nurse may visit PRN to address patientos wound care needs. o FACE TO FACE ENCOUNTER: MEDICARE and MEDICAID PATIENTS: I certify that this patient is under my care and that I had a face-to-face encounter that meets the physician face-to-face encounter requirements with this patient on this date. The encounter with the patient was in whole or in part for the following MEDICAL CONDITION: (primary reason for Home Healthcare) MEDICAL NECESSITY: I certify, that based on my findings, NURSING services are a medically necessary home health service. HOME BOUND STATUS: I certify that my clinical findings support that this patient is homebound (i.e., Due to illness or injury, pt requires aid of supportive devices such as crutches, cane, wheelchairs, walkers, the use of special transportation or the assistance of another person to leave their place of residence. There is a normal inability to leave the home and doing so requires considerable and taxing effort. Other absences are for medical reasons / religious services and are infrequent or of short duration when for other  reasons). o FACE TO FACE ENCOUNTER: MEDICARE and MEDICAID PATIENTS: I certify that this patient is under my  care and that I had a face-to-face encounter that meets the physician face-to-face encounter requirements with this patient on this date. The encounter with the patient was in whole or in part for the following MEDICAL CONDITION: (primary reason for Home Healthcare) JARONE, OSTERGAARD ([id number]) MEDICAL NECESSITY: I certify, that based on my findings, NURSING services are a medically necessary home health service. HOME BOUND STATUS: I certify that my clinical findings support that this patient is homebound (i.e., Due to illness or injury, pt requires aid of supportive devices such as crutches, cane, wheelchairs, walkers, the use of special transportation or the assistance of another person to leave their place of residence. There is a normal inability to leave the home and doing so requires considerable and taxing effort. Other absences are for medical reasons / religious services and are infrequent or of short duration when for other reasons). o If current dressing causes regression in wound condition, may D/C ordered dressing product/s and apply Normal Saline Moist Dressing daily until next Wound Healing Center / Other MD appointment. Notify Wound Healing Center of regression in wound condition at [phone]. o If current dressing causes regression in wound condition, may D/C ordered dressing product/s and apply Normal Saline Moist Dressing daily until next Wound Healing Center / Other MD appointment. Notify Wound Healing Center of regression in wound condition at [phone]. o Please direct any NON-WOUND related issues/requests for orders to patient's Primary Care Physician o Please direct any NON-WOUND related issues/requests for orders to patient's Primary Care Physician Negative Pressure Wound Therapy Wound #1 Left Achilles o Wound VAC settings at 125/130 mmHg  continuous pressure. Use BLACK/GREEN foam to wound cavity. Use WHITE foam to fill any tunnel/s and/or undermining. Change VAC dressing 3 X WEEK. Change canister as indicated when full. Nurse may titrate settings and frequency of dressing changes as clinically indicated. o Home Health Nurse may d/c VAC for s/s of increased infection, significant wound regression, or uncontrolled drainage. Notify Wound Healing Center at [phone]. o Apply contact layer over base of wound. o Number of foam/gauze pieces used in the dressing = o Other: - Apply Aquacel AG to lower half of wound bed and contact layer under foam. Electronic Signature(s) Signed: 09/05/2014 4:38:47 PM By: Evlyn Kanner MD, FACS Signed: 09/05/2014 6:31:19 PM By: Elliot Gurney, RN, BSN, Kim RN, BSN Entered By: Elliot Gurney, RN, BSN, Kim on 09/05/2014 12:41:52 ARLYN, BUERKLE ([id number]) -------------------------------------------------------------------------------- Prescription 09/05/2014 Patient Name: Cregg, Khylen L. Physician: Evlyn Kanner MD Date of Birth: 1920/09/13 NPI#: [id number] Sex: Judie Petit DEA#: [id number] Phone #: [phone] License #: Patient Address: [facility] [address] [place], [place] [address] [facility] [address], [address] [place], [place] [address] [phone] Allergies ACE Inhibitors Reaction: chest pain Severity: Severe Physician's Orders Wound VAC settings at 125/130 mmHg continuous pressure. Use BLACK/GREEN foam to wound cavity. Use WHITE foam to fill any tunnel/s and/or undermining. Change VAC dressing 3 X WEEK. Change canister as indicated when full. Nurse may titrate settings and frequency of dressing changes as clinically indicated. Signature(s): Date(s): Psychologist, prison and probation services) Signed: 09/05/2014 4:38:47 PM By: Evlyn Kanner MD, FACS Signed: 09/05/2014 6:31:19 PM By: Elliot Gurney RN, BSN, Kim RN, BSN Entered By: Elliot Gurney, RN, BSN, Kim on  09/05/2014 12:41:52 Schwartz, [name] ([id number]) --------------------------------------------------------------------------------  Problem List Details Patient Name: [name], [name] Date of Service: 09/05/2014 11:30 AM Medical Record Number: [id number] Patient Account Number: [id number] Date of Birth/Sex: September 14, 1920 (81 y.o. 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 Date of Service: 09/05/2014 11:30 AM Medical Record Number: [id number] Patient Account Number: [id number] Date of Birth/Sex: 21-Jan-1921 (81 y.o. Male) Treating RN: Curtis Sites Primary Care Physician: Lindwood Qua Other Clinician: Referring Physician: Lindwood Qua Treating Physician/Extender: Rudene Re in Treatment: 18 Subjective Chief Complaint Information obtained from Patient Patient presents to the wound care center for a consult due non healing wound 81 year old gentleman who comes with a history of having some ulcerated areas on his  heels since February 2016 and then a large injury to his left posterior heel and ankle since about a month. History of Present Illness (HPI) 81 year old gentleman who was known to be a diabetic for many years has peripheral neuropathy recently went to his primary care doctor for a punctured wound on his left heel which was something he had noticed. The patient then was referred to a podiatrist who referred him to Dr. Wyn Quaker in the vascular surgery department and I understand the procedure was done on 03/14/2014 with a left lower extremity vascular procedure and stenting was done. Details of this are not available at the present time. The patient has been applying Neosporin to his leg and has not had any wound care addressed so far. patient has also had a history of coronary artery disease in the past and hasn't had a CABG and a pacemaker placement in the remote past.Other details and notes are pending. the patient is not in pain lives alone and has some home health and other aides coming to help him with his daily chores and meals. reviewing the vascular notes I understand the procedure was done on 03/14/2014 and he was operated by Dr. Wyn Quaker. he had a catheter placement to the left peroneal artery and a aortogram and selective left lower extremity angiogram was done. He also had a percutaneous transluminal angioplasty of the left peroneal artery and the tibioperoneal trunk. The distal SFA and above-knee popliteal artery were also angioplastied. A subcutaneous stent placement to the distal superficial femoral artery was also done. 05/05/2014 -- the patient has not had any change in his health and after much consideration is decided that he does not want HBOT as he is claustrophobic and was unable to tolerate being in the chamber for 90 minutes. I have discussed with him that his most recent x-ray of the foot shows that he has the distal phalanx of the left great toe showing changes with osteomyelitis  cannot be excluded at that site. He tells me that he cannot have an MRI because of his defibrillator and hence we will order a triple phase bone scan. I have also reviewed his culture report which shows several organisms and he has sensitivity to tetracycline and we have recommended he takes this for 14 days and this has been given  to him on 05/02/2014 05/12/2014 the bone scan done on 05/10/2014 shows #1 findings are worrisome for osteomyelitis involving the calcaneus of the left foot, #2 increase uptake localizing to the left second and third toe on all 3 phases. Cannot rule out osteomyelitis in this area. And #3 left foot cellulitis. 05/19/2014 -- reviewed several reports which we have received back on Everrett Coombe. #1 chest x-ray done on April 21 shows chronic changes in the left base no acute findings. Erisman, Antoneo L. ([id number]) #2 his CBC is within normal limits. #3 hemoglobin A1c was 8.5%. #4 EKG done on 26 April was within normal limits due to his electronic ventricular pacemaker. 05/26/2014 -- he has had his PICC line placed and is taking vancomycin daily basis. He is to start hyperbaric oxygen therapy on this coming Monday. 06/09/2014 -- he saw Dr. Sampson Goon yesterday and besides his IV antibiotic I believe a oral antibiotic was also given and this may be Cipro. Patient is feeling fine otherwise does not have any symptoms though his blood pressure this morning in the wound center has been 90/50. We will check his blood pressure in the supine position. 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 Complained of worsening fatigue yesterday after HBO. Feels better today. Has appointment with PCP this afternoon. He wishes to hold off on HBO until next week. No significant pain. No fever or chills. Stable drainage. 06/23/2014 Wilmer has taken a break from HBO T and has been feeling a little better. He was seen by the nurse practitioner at his PCPs  office and at that time his vitals were stable and they did get some EKG done which was within normal limits. There have sent out some lab work which we still have to receive reports. He is going to see his PCP back this afternoon and will be seeing Dr. Sampson Goon tomorrow for review. addendum: we have received labs from his PCPs office and note that his potassium is high at 5.4 and his BUN/creatinine is slightly raised. His blood sugar was 216. His total protein and albumen were 5.9 and 3 and this was low. His HandH was 10.3 and 31.4 WBC was 8.2 and his platelets were 304. CRP was 29.6 which was high. Vancomycin trough was 19.1 which was normal TSH was 3.11 which was normal 06/30/2014 -- Eliberto Ivory feels weak today and he feels his stomach is acting up and his not feeling up to doing hyperbaric oxygen therapy. He would like to take a break today and tomorrow and resume on Monday. He is on vancomycin and Levaquin as per ID and he has an appointment to see the vascular surgeons in about 10 days' time. 07/07/2014 -- Eliberto Ivory has been feeling weak all along and he has decided that he is not going to continue with hyperbaric oxygen therapy and a longer period. He had finished 16 of his 40 treatments and will not take anymore. He has an appointment with ID tomorrow and also his vascular surgeons and I will have a conference with both of them regarding his further care. 07/14/2014 -- over the last week I have contacted Dr. Sampson Goon and Dr. Wyn Quaker and discussed his care. Dr. Wyn Quaker agrees that he will take him to the OR and debrided his wound and use a wound VAC. Dr. Sampson Goon has altered his anti-biotic regimen - vancomycin has been stopped and he is on oral levofloxacin. The patient's surgery is scheduled for July 7. 07/21/2014 -- he continues to  feel unwell and has a lot of GI disturbances to probably his antibiotics. He also has developed a new blister on his left fourth toe and is concerned about his  upcoming surgery which is scheduled for next Thursday. 08/04/2014 -- he was taken to the OR on 07/28/2014 and a debridement of the left heel was done of skin and soft tissue and the exposed calcaneum to remove all nonviable tissue. A wound VAC was then applied at the end of the procedure. GERVASE, COLBERG ([id number]) 08/12/2014 -- he was seen by Dr. Sampson Goon today and he is going to continue one more week of Levaquin and then stop it and see him back in 3 weeks' time. I have reviewed his notes from today. 08/29/2014 -- he saw Dr. Wyn Quaker his surgeon who did not have any other suggestions except for me to proceed with the skin substitute when ready. 09/05/2014 -- he saw Dr. Sampson Goon last week and he has done some tests and is awaiting his decision regarding putting him on further antibiotics. Objective Constitutional Pulse regular. Respirations normal and unlabored. Afebrile. Vitals Time Taken: 11:48 AM, Height: 70 in, Weight: 200 lbs, BMI: 28.7, Pulse: 88 bpm, Respiratory Rate: 20 breaths/min, Blood Pressure: 101/44 mmHg. Eyes Nonicteric. Reactive to light. Ears, Nose, Mouth, and Throat Lips, teeth, and gums WNL.Marland Kitchen Moist mucosa without lesions . Neck supple and nontender. No palpable supraclavicular or cervical adenopathy. Normal sized without goiter. Respiratory WNL. No retractions.. Cardiovascular Pedal Pulses WNL. No clubbing, cyanosis or edema. Chest Breasts symmetical and no nipple discharge.. Breast tissue WNL, no masses, lumps, or tenderness.. Lymphatic No adneopathy. No adenopathy. No adenopathy. Musculoskeletal Adexa without tenderness or enlargement.. Digits and nails w/o clubbing, cyanosis, infection, petechiae, ischemia, or inflammatory conditions.Marland Kitchen Psychiatric Judgement and insight Intact.. No evidence of depression, anxiety, or agitation.Marland Kitchen WEBB, WEED ([id number]) General Notes: There still is some slough at the inferior part of the posterior calcaneal wound and  there is some maceration of the lateral calcaneal region. The toe has some debris which is sharply debrided and was probing down to bone. Integumentary (Hair, Skin) No suspicious lesions. No crepitus or fluctuance. No peri-wound warmth or erythema. No masses.. Wound #1 status is Open. Original cause of wound was Gradually Appeared. The wound is located on the Left Achilles. The wound measures 7cm length x 4.5cm width x 0.3cm depth; 24.74cm^2 area and 7.422cm^3 volume. The wound is limited to skin breakdown. There is a large amount of serosanguineous drainage noted. The wound margin is distinct with the outline attached to the wound base. There is large (67-100%) pink granulation within the wound bed. There is a small (1-33%) amount of necrotic tissue within the wound bed including Adherent Slough. The periwound skin appearance exhibited: Maceration, Moist. The periwound skin appearance did not exhibit: Callus, Crepitus, Excoriation, Fluctuance, Friable, Induration, Localized Edema, Rash, Scarring, Dry/Scaly, Atrophie Blanche, Cyanosis, Ecchymosis, Hemosiderin Staining, Mottled, Pallor, Rubor, Erythema. Wound #2 status is Open. Original cause of wound was Gradually Appeared. The wound is located on the Left,Lateral Calcaneous. The wound measures 0.4cm length x 0.5cm width x 0.2cm depth; 0.157cm^2 area and 0.031cm^3 volume. The wound is limited to skin breakdown. There is a none present amount of drainage noted. There is large (67-100%) pale granulation within the wound bed. There is a small (1-33%) amount of necrotic tissue within the wound bed including Adherent Slough. The periwound skin appearance exhibited: Maceration. The periwound skin appearance did not exhibit: Callus, Crepitus, Excoriation, Fluctuance, Friable, Induration, Localized Edema, Rash, Scarring,  Dry/Scaly, Moist, Atrophie Blanche, Cyanosis, Ecchymosis, Hemosiderin Staining, Mottled, Pallor, Rubor, Erythema. Wound #3 status is  Open. Original cause of wound was Gradually Appeared. The wound is located on the Left Toe Great. The wound measures 0.5cm length x 0.4cm width x 0.2cm depth; 0.157cm^2 area and 0.031cm^3 volume. The wound is limited to skin breakdown. The wound margin is flat and intact. There is no granulation within the wound bed. There is a large (67-100%) amount of necrotic tissue within the wound bed including Adherent Slough. The periwound skin appearance exhibited: Dry/Scaly, Moist. 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 ([id number]) I have discussed with him the possibility of taking him to the operating room after a plastic surgery opinion and to putting either Acell order SSG. however the patient does not want to go to the operating room for another procedure. I recommended continuing with Aquacel Ag under the wound VAC and his calcaneum drape. Will also continue with Prisma on the toe. He will come back and see me next week with her decision whether he wants a plastic surgery opinion. 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 There was a Skin/Subcutaneous Tissue Debridement (96295-28413) debridement with total area of 12 sq cm performed by Jaan Fischel, Ignacia Felling., MD. with the following instrument(s): Forceps to remove Non-Viable tissue/material including Exudate, Fibrin/Slough, Eschar, and Subcutaneous after achieving pain control using Other (lidocaine 4%). A time out was conducted prior to the start of the procedure. There was no bleeding. The procedure was tolerated well with  a pain level of 0 throughout and a pain level of 0 following the procedure. Post Debridement Measurements: 7cm length x 4.5cm width x 0.3cm depth; 7.422cm^3 volume. Wound #3 Wound #3 is an Arterial Insufficiency Ulcer located on the Left Toe Great . There was a Skin/Subcutaneous Tissue Debridement (24401-02725) debridement with total area of 0.2 sq cm performed by Mylasia Vorhees, Ignacia Felling., MD. with the following instrument(s): Forceps to remove Non-Viable tissue/material including Exudate, Fibrin/Slough, Eschar, and Subcutaneous after achieving pain control using Other (lidocaine 4%). A time out was conducted prior to the start of the procedure. There was no bleeding. The procedure was tolerated well with a pain level of 0 throughout and a pain level of 0 following the procedure. Post Debridement Measurements: 0.5cm length x 0.4cm width x 0.2cm depth; 0.031cm^3 volume. Plan Wound Cleansing: Wound #1 Left Achilles: Clean wound with Normal Saline. May Shower, gently pat wound dry prior to applying new dressing. May shower with protection. Wound #2 Left,Lateral Calcaneous: Clean wound with Normal Saline. May Shower, gently pat wound dry prior to applying new dressing. May shower with protection. Wound #3 Left Toe Great: Clean wound with Normal Saline. May Shower, gently pat wound dry prior to applying new dressing. May shower with protection. Anesthetic: KIEON, LAWHORN ([id number]) Wound #1 Left Achilles: Topical Lidocaine 4% cream applied to wound bed prior to debridement Wound #2 Left,Lateral Calcaneous: Topical Lidocaine 4% cream applied to wound bed prior to debridement Wound #3 Left Toe Great: Topical Lidocaine 4% cream applied to wound bed prior to debridement Skin Barriers/Peri-Wound Care: Wound #1 Left Achilles: Skin Prep Wound #  2 Left,Lateral Calcaneous: Skin Prep Wound #3 Left Toe Great: Skin Prep Primary Wound Dressing: Wound #1 Left Achilles: Aquacel Ag Wound #2  Left,Lateral Calcaneous: Aquacel Ag Wound #3 Left Toe Great: Prisma Ag Secondary Dressing: Wound #1 Left Achilles: Gauze and Kerlix/Conform Wound #2 Left,Lateral Calcaneous: Boardered Foam Dressing Dressing Change Frequency: Wound #1 Left Achilles: Change Dressing Monday, Wednesday, Friday Wound #2 Left,Lateral Calcaneous: Change Dressing Monday, Wednesday, Friday Wound #3 Left Toe Great: Change Dressing Monday, Wednesday, Friday Follow-up Appointments: Wound #1 Left Achilles: Return Appointment in 1 week. Wound #2 Left,Lateral Calcaneous: Return Appointment in 1 week. Wound #3 Left Toe Great: Return Appointment in 1 week. Home Health: Wound #1 Left Achilles: Continue Home Health Visits - [facility] - To change Wednesday, Friday. Continue Home Health Visits - [facility] - To change Wednesday, Friday. Home Health Nurse may visit PRN to address patient s wound care needs. Home Health Nurse may visit PRN to address patient s wound care needs. FACE TO FACE ENCOUNTER: MEDICARE and MEDICAID PATIENTS: I certify that this patient is under my care and that I had a face-to-face encounter that meets the physician face-to-face encounter requirements with this patient on this date. The encounter with the patient was in whole or in part for the following MEDICAL CONDITION: (primary reason for Home Healthcare) MEDICAL NECESSITY: I certify, that based on my findings, NURSING services are a medically necessary home health service. HOME BOUND STATUS: I certify that my clinical findings support that this patient is homebound (i.e., Due to FADIL, MACMASTER. ([id number]) illness or injury, pt requires aid of supportive devices such as crutches, cane, wheelchairs, walkers, the use of special transportation or the assistance of another person to leave their place of residence. There is a normal inability to leave the home and doing so requires considerable and taxing effort. Other absences are for medical  reasons / religious services and are infrequent or of short duration when for other reasons). FACE TO FACE ENCOUNTER: MEDICARE and MEDICAID PATIENTS: I certify that this patient is under my care and that I had a face-to-face encounter that meets the physician face-to-face encounter requirements with this patient on this date. The encounter with the patient was in whole or in part for the following MEDICAL CONDITION: (primary reason for Home Healthcare) MEDICAL NECESSITY: I certify, that based on my findings, NURSING services are a medically necessary home health service. HOME BOUND STATUS: I certify that my clinical findings support that this patient is homebound (i.e., Due to illness or injury, pt requires aid of supportive devices such as crutches, cane, wheelchairs, walkers, the use of special transportation or the assistance of another person to leave their place of residence. There is a normal inability to leave the home and doing so requires considerable and taxing effort. Other absences are for medical reasons / religious services and are infrequent or of short duration when for other reasons). If current dressing causes regression in wound condition, may D/C ordered dressing product/s and apply Normal Saline Moist Dressing daily until next Wound Healing Center / Other MD appointment. Notify Wound Healing Center of regression in wound condition at [phone]. If current dressing causes regression in wound condition, may D/C ordered dressing product/s and apply Normal Saline Moist Dressing daily until next Wound Healing Center / Other MD appointment. Notify Wound Healing Center of regression in wound condition at [phone]. Please direct any NON-WOUND related issues/requests for orders to patient's Primary Care Physician Please direct any NON-WOUND related issues/requests for orders to patient's  Primary Care Physician Wound #2 Left,Lateral Calcaneous: Continue Home Health Visits -  [facility] - To change Wednesday, Friday. Continue Home Health Visits - [facility] - To change Wednesday, Friday. Home Health Nurse may visit PRN to address patient s wound care needs. Home Health Nurse may visit PRN to address patient s wound care needs. FACE TO FACE ENCOUNTER: MEDICARE and MEDICAID PATIENTS: I certify that this patient is under my care and that I had a face-to-face encounter that meets the physician face-to-face encounter requirements with this patient on this date. The encounter with the patient was in whole or in part for the following MEDICAL CONDITION: (primary reason for Home Healthcare) MEDICAL NECESSITY: I certify, that based on my findings, NURSING services are a medically necessary home health service. HOME BOUND STATUS: I certify that my clinical findings support that this patient is homebound (i.e., Due to illness or injury, pt requires aid of supportive devices such as crutches, cane, wheelchairs, walkers, the use of special transportation or the assistance of another person to leave their place of residence. There is a normal inability to leave the home and doing so requires considerable and taxing effort. Other absences are for medical reasons / religious services and are infrequent or of short duration when for other reasons). FACE TO FACE ENCOUNTER: MEDICARE and MEDICAID PATIENTS: I certify that this patient is under my care and that I had a face-to-face encounter that meets the physician face-to-face encounter requirements with this patient on this date. The encounter with the patient was in whole or in part for the following MEDICAL CONDITION: (primary reason for Home Healthcare) MEDICAL NECESSITY: I certify, that based on my findings, NURSING services are a medically necessary home health service. HOME BOUND STATUS: I certify that my clinical findings support that this patient is homebound (i.e., Due to illness or injury, pt requires aid of supportive devices such  as crutches, cane, wheelchairs, walkers, the use of special transportation or the assistance of another person to leave their place of residence. There is a normal inability to leave the home and doing so requires considerable and taxing effort. Other absences are for medical reasons / religious services and are infrequent or of short duration when for other reasons). If current dressing causes regression in wound condition, may D/C ordered dressing product/s and apply Normal Saline Moist Dressing daily until next Wound Healing Center / Other MD appointment. Notify Wound Healing Center of regression in wound condition at [phone]. If current dressing causes regression in wound condition, may D/C ordered dressing product/s and apply [place]o, Bradley L. ([id number]) Normal Saline Moist Dressing daily until next Wound Healing Center / Other MD appointment. Notify Wound Healing Center of regression in wound condition at [phone]. Please direct any NON-WOUND related issues/requests for orders to patient's Primary Care Physician Please direct any NON-WOUND related issues/requests for orders to patient's Primary Care Physician Wound #3 Left Toe Great: Continue Home Health Visits - [facility] - To change Wednesday, Friday. Continue Home Health Visits - [facility] - To change Wednesday, Friday. Home Health Nurse may visit [facility] to address patient s wound care needs. Home Health Nurse may visit [facility] to address patient s wound care needs. FACE TO FACE ENCOUNTER: MEDICARE and MEDICAID PATIENTS: I certify that this patient is under my care and that I had a face-to-face encounter that meets the physician face-to-face encounter requirements with this patient on this date. The encounter with the patient was in whole or in part for the following MEDICAL CONDITION: (primary  reason for Home Healthcare) MEDICAL NECESSITY: I certify, that based on my findings, NURSING services are a medically necessary home health  service. HOME BOUND STATUS: I certify that my clinical findings support that this patient is homebound (i.e., Due to illness or injury, pt requires aid of supportive devices such as crutches, cane, wheelchairs, walkers, the use of special transportation or the assistance of another person to leave their place of residence. There is a normal inability to leave the home and doing so requires considerable and taxing effort. Other absences are for medical reasons / religious services and are infrequent or of short duration when for other reasons). FACE TO FACE ENCOUNTER: MEDICARE and MEDICAID PATIENTS: I certify that this patient is under my care and that I had a face-to-face encounter that meets the physician face-to-face encounter requirements with this patient on this date. The encounter with the patient was in whole or in part for the following MEDICAL CONDITION: (primary reason for Home Healthcare) MEDICAL NECESSITY: I certify, that based on my findings, NURSING services are a medically necessary home health service. HOME BOUND STATUS: I certify that my clinical findings support that this patient is homebound (i.e., Due to illness or injury, pt requires aid of supportive devices such as crutches, cane, wheelchairs, walkers, the use of special transportation or the assistance of another person to leave their place of residence. There is a normal inability to leave the home and doing so requires considerable and taxing effort. Other absences are for medical reasons / religious services and are infrequent or of short duration when for other reasons). If current dressing causes regression in wound condition, may D/C ordered dressing product/s and apply Normal Saline Moist Dressing daily until next Wound Healing Center / Other MD appointment. Notify Wound Healing Center of regression in wound condition at [phone]. If current dressing causes regression in wound condition, may D/C ordered dressing  product/s and apply Normal Saline Moist Dressing daily until next Wound Healing Center / Other MD appointment. Notify Wound Healing Center of regression in wound condition at [phone]. Please direct any NON-WOUND related issues/requests for orders to patient's Primary Care Physician Please direct any NON-WOUND related issues/requests for orders to patient's Primary Care Physician Negative Pressure Wound Therapy: Wound #1 Left Achilles: Wound VAC settings at 125/130 mmHg continuous pressure. Use BLACK/GREEN foam to wound cavity. Use WHITE foam to fill any tunnel/s and/or undermining. Change VAC dressing 3 X WEEK. Change canister as indicated when full. Nurse may titrate settings and frequency of dressing changes as clinically indicated. Home Health Nurse may d/c VAC for s/s of increased infection, significant wound regression, or uncontrolled drainage. Notify Wound Healing Center at [phone]. Apply contact layer over base of wound. Number of foam/gauze pieces used in the dressing = Other: - Apply Aquacel AG to lower half of wound bed and contact layer under foam. Clemon, Avyn L. ([id number]) I have discussed with him the possibility of taking him to the operating room after a plastic surgery opinion and to putting either Acell order SSG. however the patient does not want to go to the operating room for another procedure. I recommended continuing with Aquacel Ag under the wound VAC and his calcaneum drape. Will also continue with Prisma on the toe. He will come back and see me next week with her decision whether he wants a plastic surgery opinion. Electronic Signature(s) Signed: 09/06/2014 7:50:24 AM By: Evlyn Kanner MD, FACS Previous Signature: 09/05/2014 12:23:21 PM Version By: Evlyn Kanner MD, FACS Entered By:  Louvinia Cumbo on 09/06/2014 07:50:23 Humble, [name] ([id number]) -------------------------------------------------------------------------------- SuperBill  Details Patient Name: Gutterman, Michall L. Date of Service: 09/05/2014 Medical Record Number: [id number] Patient Account Number: [id number] Date of Birth/Sex: 1920/02/08 (81 y.o. 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 Chronic multifocal osteomyelitis, left ankle and foot Facility Procedures CPT4 Code Description: [id number] 11042 - DEB SUBQ TISSUE 20 SQ CM/< ICD-10 Description Diagnosis E11.621 Type 2 diabetes mellitus with foot ulcer E11.52 Type 2 diabetes mellitus with diabetic peripheral angi L97.323 Non-pressure chronic ulcer of left  ankle with necrosis Modifier: opathy with gang of muscle Quantity: 1 rene Physician Procedures CPT4 Code Description: [id number] 11042 - WC PHYS SUBQ TISS 20 SQ CM ICD-10 Description Diagnosis E11.621 Type 2 diabetes mellitus with foot ulcer E11.52 Type 2 diabetes mellitus with diabetic peripheral angi L97.323 Non-pressure chronic ulcer of left ankle  with necrosis Modifier: opathy with gang of muscle Quantity: 1 rene Electronic Signature(s) Signed: 09/05/2014 12:23:38 PM By: Evlyn Kanner MD, FACS Entered By: Evlyn Kanner on 09/05/2014 12:23:38

## 2014-09-06 NOTE — Progress Notes (Signed)
EVERETTE, MALL ([id number]) Visit Report for 09/05/2014 Arrival Information Details Patient Name: [name], [name]. Date of Service: 09/05/2014 11:30 AM Medical Record Number: [id number] Patient Account Number: [id number] Date of Birth/Sex: Jul 19, 1920 (81 y.o. Male) Treating RN: [name] Primary Care Physician: [name] Other Clinician: Referring Physician: Lindwood [name] Treating Physician/Extender: [name] in Treatment: 18 Visit Information History Since Last Visit Added or deleted any medications: No Patient Arrived: Ambulatory Any new allergies or adverse reactions: No Arrival Time: 11:48 Had a fall or experienced change in No Accompanied By: son activities of daily living that may affect Transfer Assistance: Manual risk of falls: Patient Identification Verified: Yes Signs or symptoms of abuse/neglect since last No Secondary Verification Process Yes visito Completed: Hospitalized since last visit: No Patient Requires Transmission- No Has Dressing in Place as Prescribed: Yes Based Precautions: Pain Present Now: No Patient Has Alerts: Yes Patient Alerts: 04/27/13 ABI (L) 0.91 (R) 0.71 Electronic Signature(s) Signed: 09/05/2014 6:31:19 PM By: [name] Gurney, RN, BSN, Kim RN, BSN Entered By: [name] Gurney, RN, BSN, [name] on 09/05/2014 11:48:42 [name] ([id number]) -------------------------------------------------------------------------------- Encounter Discharge Information Details Patient Name: [name], [name] Date of Service: 09/05/2014 11:30 AM Medical Record Number: [id number] Patient Account Number: [id number] Date of Birth/Sex: 09-21-1920 (81 y.o. Male) Treating RN: [name] Primary Care Physician: [name] Other Clinician: Referring Physician: Lindwood [name] Treating Physician/Extender: [name] in Treatment: 3 Encounter Discharge Information Items Discharge Pain Level: 0 Discharge Condition: Stable Ambulatory Status:  Wheelchair Discharge Destination: Home Private Transportation: Auto Accompanied By: son Schedule Follow-up Appointment: Yes Medication Reconciliation completed and Yes provided to Patient/Care [name]: Clinical Summary of Care: Electronic Signature(s) Signed: 09/05/2014 6:31:19 PM By: [name] Gurney, RN, BSN, Kim RN, BSN Entered By: [name] Gurney, RN, BSN, [name] on 09/05/2014 12:43:37 [name] ([id number]) -------------------------------------------------------------------------------- Lower Extremity Assessment Details Patient Name: [name], [name] Date of Service: 09/05/2014 11:30 AM Medical Record Number: [id number] Patient Account Number: [id number] Date of Birth/Sex: 25-Jun-1920 (81 y.o. Male) Treating RN: [name] Primary Care Physician: [name] Other Clinician: Referring Physician: Lindwood [name] Treating Physician/Extender: [name] in Treatment: 18 Vascular Assessment Pulses: Posterior Tibial Dorsalis Pedis Palpable: [Left:No] Doppler: [Left:Monophasic] Extremity colors, hair growth, and conditions: Extremity Color: [Left:Pale] Hair Growth on Extremity: [Left:No] Temperature of Extremity: [Left:Warm] Capillary Refill: [Left:< 3 seconds] Toe Nail Assessment Left: Right: Thick: Yes Discolored: Yes Deformed: Yes Improper Length and Hygiene: Yes Electronic Signature(s) Signed: 09/05/2014 6:31:19 PM By: [name] Gurney, RN, BSN, Kim RN, BSN Entered By: [name] Gurney, RN, BSN, [name] on 09/05/2014 11:59:30 [name] ([id number]) -------------------------------------------------------------------------------- Multi Wound Chart Details Patient Name: [name], [name] Date of Service: 09/05/2014 11:30 AM Medical Record Number: [id number] Patient Account Number: [id number] Date of Birth/Sex: July 17, 1920 (81 y.o. 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] Comorbid History: [1:Cataracts, Chronic sinus Cataracts, Chronic sinus Cataracts, Chronic sinus problems/congestion, Congestive Heart Failure, Congestive Heart Failure, Congestive Heart Failure, Coronary Artery Disease, Coronary Artery Disease, Coronary  Artery Disease, Hypertension, Myocardial Hypertension, Myocardial Hypertension, Myocardial Infarction, Peripheral Arterial Disease, Type II Arterial Disease, Type II Arterial Disease,  Type II Diabetes, Neuropathy] [2:problems/congestion, Infarction,  Peripheral Diabetes, Neuropathy] [3:problems/congestion, Infarction, Peripheral Diabetes, Neuropathy] Date Acquired: [1:01/24/2014] [2:01/24/2014] [3:01/24/2014] Weeks of Treatment: [1:18] [2:18] [3:18] Wound Status: [1:Open] [2:Open] [3:Open] Clustered Wound: [1:No] [2:Yes] [3:No] Pending Amputation on Yes [2:No] [3:No] Presentation: Measurements L x W x D 7x4.5x0.3 [2:0.4x0.5x0.2] [3:0.5x0.4x0.2] (cm) Area (cm) : [1:24.74] [2:0.157] [3:0.157] Volume (cm) : [1:7.422] [2:0.031] [3:0.031] % Reduction in Area: [1:0.00%] [2:97.20%] [3:81.80%] % Reduction in Volume: 0.00% [2:94.50%] [3:64.00%] Classification: [1:Full Thickness With Exposed Support Structures] [2:Full Thickness Without Exposed Support Structures] [3:Full Thickness Without Exposed Support Structures] HBO Classification: [1:Grade 2] [2:Grade 2] [3:Grade 1] Exudate Amount: [1:Large] [2:None Present] [3:N/A] Exudate  Type: [1:Serosanguineous] [2:N/A] [3:N/A] Exudate Color: red, brown N/A N/A Wound Margin: Distinct, outline attached N/A Flat and Intact Granulation Amount: Large (67-100%) Large (67-100%) None Present (0%) Granulation Quality: Pink Pale N/A Necrotic Amount: Small (1-33%) Small (1-33%) Large (67-100%) Exposed Structures: Fascia: No Fascia: No Fascia: No Fat: No Fat: No Fat: No Tendon: No Tendon: No Tendon: No Muscle: No Muscle: No Muscle: No Joint: No Joint: No Joint: No Bone: No Bone: No Bone: No Limited to Skin Limited to Skin Limited to Skin Breakdown Breakdown Breakdown Epithelialization: Medium (34-66%) Small (1-33%) Small (1-33%) Periwound Skin Texture: Edema: No Edema: No No Abnormalities Noted Excoriation: No Excoriation: No Induration: No Induration: No Callus: No Callus: No Crepitus: No Crepitus: No Fluctuance: No Fluctuance: No Friable: No Friable: No Rash: No Rash: No Scarring: No Scarring: No Periwound Skin Maceration: Yes Maceration: Yes Moist: Yes Moisture: Moist: Yes Moist: No Dry/Scaly: Yes Dry/Scaly: No Dry/Scaly: No Periwound Skin Color: Atrophie Blanche: No Atrophie Blanche: No No Abnormalities Noted Cyanosis: No Cyanosis: No Ecchymosis: No Ecchymosis: No Erythema: No Erythema: No Hemosiderin Staining: No Hemosiderin Staining: No Mottled: No Mottled: No Pallor: No Pallor: No Rubor: No Rubor: No Tenderness on No No No Palpation: Wound Preparation: Ulcer Cleansing: Ulcer Cleansing: Ulcer Cleansing: Rinsed/Irrigated with Rinsed/Irrigated with Rinsed/Irrigated with Saline Saline Saline Topical Anesthetic Topical Anesthetic Topical Anesthetic Applied: Other: lidocaine Applied: Other: lidociane Applied: Other: lidociane 4% 4% 4% Treatment Notes Electronic Signature(s) Signed: 09/05/2014 6:31:19 PM By: [name] Gurney, RN, BSN, Kim RN, BSN Entered By: [name] Gurney, RN, BSN, [name] on 09/05/2014 11:59:49 SIME, Raoul [name] ([id number]) HY, SWIATEK ([id number]) -------------------------------------------------------------------------------- Multi-Disciplinary Care Plan Details Patient Name: [name], [name] Date of Service: 09/05/2014 11:30 AM Medical Record Number: [id number] Patient Account Number: [id number] Date of Birth/Sex: [birth year] (81 y.o. Male) Treating RN: [name] Primary Care Physician: [name] Other Clinician: Referring Physician: Lindwood [name] Treating Physician/Extender: [name] in Treatment: 58 Active Inactive Abuse / Safety / Falls / Self Care Management Nursing Diagnoses: Potential for falls Goals: Patient will remain injury free Date Initiated: 04/28/2014 Goal Status: Active Interventions: Assess fall risk on admission and as needed Notes: Necrotic Tissue Nursing Diagnoses: Impaired tissue integrity related to necrotic/devitalized tissue Goals: Necrotic/devitalized tissue will be minimized in the wound bed Date Initiated: 04/28/2014 Goal Status: Active Interventions: Provide education on necrotic tissue and debridement process Treatment Activities: Apply topical anesthetic as ordered : 09/05/2014 Notes: Nutrition Nursing Diagnoses: Potential for alteratiion in Nutrition/Potential for imbalanced nutrition Rosinski, Muaad L. ([id number]) Goals: Patient/caregiver agrees to and verbalizes understanding of need to use nutritional supplements and/or vitamins as prescribed Date Initiated: 04/28/2014 Goal Status: Active Interventions: Assess patient nutrition upon admission and as needed per policy Notes: Orientation to the Wound Care Program Nursing Diagnoses: Knowledge deficit related to the wound healing center program Goals: Patient/caregiver will verbalize understanding of  the Wound Healing Center Program Date Initiated: 04/28/2014 Goal Status: Active Interventions: Provide education on orientation to the wound center Notes: Wound/Skin Impairment Nursing  Diagnoses: Impaired tissue integrity Goals: Ulcer/skin breakdown will have a volume reduction of 30% by week 4 Date Initiated: 04/28/2014 Goal Status: Active Interventions: Assess ulceration(s) every visit Notes: Electronic Signature(s) Signed: 09/05/2014 6:31:19 PM By: [name] Gurney, RN, BSN, Kim RN, BSN Entered By: [name] Gurney, RN, BSN, [name] on 09/05/2014 11:59:41 Bechtold, Raoul [name] ([id number]) -------------------------------------------------------------------------------- Pain Assessment Details Patient Name: [name], [name] Date of Service: 09/05/2014 11:30 AM Medical Record Number: [id number] Patient Account Number: [id number] Date of Birth/Sex: October 22, 1920 (81 y.o. Male) Treating RN: [name] Primary Care Physician: [name] Other Clinician: Referring Physician: Lindwood [name] Treating Physician/Extender: [name] in Treatment: 18 Active Problems Location of Pain Severity and Description of Pain Patient Has Paino No Site Locations Pain Management and Medication Current Pain Management: Electronic Signature(s) Signed: 09/05/2014 6:31:19 PM By: [name] Gurney, RN, BSN, Kim RN, BSN Entered By: [name] Gurney, RN, BSN, [name] on 09/05/2014 11:48:50 Klarich, Raoul [name] ([id number]) -------------------------------------------------------------------------------- Patient/Caregiver Education Details Patient Name: Regina, Jaysin L. Date of Service: 09/05/2014 11:30 AM Medical Record Number: [id number] Patient Account Number: [id number] Date of Birth/Gender: 1920/10/01 (81 y.o. Male) Treating RN: [name] Primary Care Physician: [name] Other Clinician: Referring Physician: Lindwood [name] Treating Physician/Extender: [name] in Treatment: 18 Education Assessment Education Provided To: Patient Education Topics Provided Wound Debridement: Handouts: Wound Debridement Methods: Explain/Verbal Responses: State content correctly Wound/Skin Impairment: Handouts: Other: continue  wounbd care as prescribed Electronic Signature(s) Signed: 09/05/2014 6:31:19 PM By: [name] Gurney, RN, BSN, Kim RN, BSN Entered By: [name] Gurney, RN, BSN, [name] on 09/05/2014 12:44:08 Sumpter, Raoul [name] ([id number]) -------------------------------------------------------------------------------- Wound Assessment Details Patient Name: [name], [name] Date of Service: 09/05/2014 11:30 AM Medical Record Number: [id number] Patient Account Number: [id number] Date of Birth/Sex: 01-10-1921 (81 y.o. Male) Treating RN: [name] Primary Care Physician: [name] Other Clinician: Referring Physician: Lindwood [name] Treating Physician/Extender: [name] in Treatment: 18 Wound Status Wound Number: 1 Primary Arterial Insufficiency Ulcer Etiology: Wound Location: Left Achilles Wound Open Wounding Event: Gradually Appeared Status: Date Acquired: 01/24/2014 Comorbid Cataracts, Chronic sinus Weeks Of Treatment: 18 History: problems/congestion, Congestive Heart Clustered Wound: No Failure, Coronary Artery Disease, Pending Amputation On Presentation Hypertension, Myocardial Infarction, Peripheral Arterial Disease, Type II Diabetes, Neuropathy Photos Photo Uploaded By: [name] Gurney, RN, BSN, [name] on 09/05/2014 18:23:21 Wound Measurements Length: (cm) 7 Width: (cm) 4.5 Depth: (cm) 0.3 Area: (cm) 24.74 Volume: (cm) 7.422 % Reduction in Area: 0% % Reduction in Volume: 0% Epithelialization: Medium (34-66%) Wound Description Classification: GERARDO, CAIAZZO. ([id number]) Foul Odor After Cleansing: No Full Thickness With Exposed Support Structures Diabetic Severity Grade 2 (Wagner): Wound Margin: Distinct, outline attached Exudate Amount: Large Exudate Type: Serosanguineous Exudate Color: red, brown Wound Bed Granulation Amount: Large (67-100%) Exposed Structure Granulation Quality: Pink Fascia Exposed: No Necrotic Amount: Small (1-33%) Fat Layer Exposed: No Necrotic Quality: Adherent  Slough Tendon Exposed: No Muscle Exposed: No Joint Exposed: No Bone Exposed: No Limited to Skin Breakdown Periwound Skin Texture Texture Color No Abnormalities Noted: No No Abnormalities Noted: No Callus: No Atrophie Blanche: No Crepitus: No Cyanosis: No Excoriation: No Ecchymosis: No Fluctuance: No Erythema: No Friable: No Hemosiderin Staining: No Induration: No Mottled: No Localized Edema: No Pallor: No Rash: No Rubor: No Scarring: No Moisture No Abnormalities Noted: No Dry / Scaly: No Maceration: Yes Moist: Yes Wound Preparation Ulcer Cleansing: Rinsed/Irrigated with Saline Topical Anesthetic Applied: Other: lidocaine 4%, Treatment  Notes Wound #1 (Left Achilles) 1. Cleansed with: Clean wound with Normal Saline 2. Anesthetic Merida, Jayce L. ([id number]) Topical Lidocaine 4% cream to wound bed prior to debridement 4. Dressing Applied: Aquacel Ag 8. Negative Pressure Wound Therapy Wound Vac to wound continuously at 154mm/hg pressure Black Foam Apply contact layer over base of wound. Number of foam/gauze pieces used in the dressing = Change canister as needed. Notes Contact layer over bone. 1 piece of foam Electronic Signature(s) Signed: 09/05/2014 6:31:19 PM By: [name] Gurney, RN, BSN, Kim RN, BSN Entered By: [name] Gurney, RN, BSN, [name] on 09/05/2014 11:58:39 Derosa, Raoul [name] ([id number]) -------------------------------------------------------------------------------- Wound Assessment Details Patient Name: [name], [name] Date of Service: 09/05/2014 11:30 AM Medical Record Number: [id number] Patient Account Number: [id number] Date of Birth/Sex: [birth year] (81 y.o. Male) Treating RN: [name] Primary Care Physician: [name] Other Clinician: Referring Physician: Lindwood [name] Treating Physician/Extender: [name] in Treatment: 18 Wound Status Wound Number: 2 Primary Arterial Insufficiency Ulcer Etiology: Wound Location: Left Calcaneous -  Lateral Wound Open Wounding Event: Gradually Appeared Status: Date Acquired: 01/24/2014 Comorbid Cataracts, Chronic sinus Weeks Of Treatment: 18 History: problems/congestion, Congestive Heart Clustered Wound: Yes Failure, Coronary Artery Disease, Hypertension, Myocardial Infarction, Peripheral Arterial Disease, Type II Diabetes, Neuropathy Photos Photo Uploaded By: [name] Gurney, RN, BSN, [name] on 09/05/2014 18:23:22 Wound Measurements Length: (cm) 0.4 Width: (cm) 0.5 Depth: (cm) 0.2 Area: (cm) 0.157 Volume: (cm) 0.031 % Reduction in Area: 97.2% % Reduction in Volume: 94.5% Epithelialization: Small (1-33%) Wound Description Full Thickness Without Exposed Classification: Support Structures Diabetic Severity Grade 2 (Wagner): Exudate Amount: None Present Wound Bed Granulation Amount: Large (67-100%) Exposed Structure Velador, Ryaan L. ([id number]) Granulation Quality: Pale Fascia Exposed: No Necrotic Amount: Small (1-33%) Fat Layer Exposed: No Necrotic Quality: Adherent Slough Tendon Exposed: No Muscle Exposed: No Joint Exposed: No Bone Exposed: No Limited to Skin Breakdown Periwound Skin Texture Texture Color No Abnormalities Noted: No No Abnormalities Noted: No Callus: No Atrophie Blanche: No Crepitus: No Cyanosis: No Excoriation: No Ecchymosis: No Fluctuance: No Erythema: No Friable: No Hemosiderin Staining: No Induration: No Mottled: No Localized Edema: No Pallor: No Rash: No Rubor: No Scarring: No Moisture No Abnormalities Noted: No Dry / Scaly: No Maceration: Yes Moist: No Wound Preparation Ulcer Cleansing: Rinsed/Irrigated with Saline Topical Anesthetic Applied: Other: lidociane 4%, Treatment Notes Wound #2 (Left, Lateral Calcaneous) 1. Cleansed with: Clean wound with Normal Saline 2. Anesthetic Topical Lidocaine 4% cream to wound bed prior to debridement 4. Dressing Applied: Aquacel Ag 5. Secondary Dressing Applied Bordered Foam  Dressing Electronic Signature(s) Signed: 09/05/2014 6:31:19 PM By: [name] Gurney, RN, BSN, Kim RN, BSN Entered By: [name] Gurney, RN, BSN, [name] on 09/05/2014 11:58:25 Darco, Raoul [name] ([id number]) -------------------------------------------------------------------------------- Wound Assessment Details Patient Name: [name], [name] Date of Service: 09/05/2014 11:30 AM Medical Record Number: [id number] Patient Account Number: [id number] Date of Birth/Sex: 01-Jan-1921 (81 y.o. Male) Treating RN: [name] Primary Care Physician: [name] Other Clinician: Referring Physician: Lindwood [name] Treating Physician/Extender: [name] in Treatment: 18 Wound Status Wound Number: 3 Primary Arterial Insufficiency Ulcer Etiology: Wound Location: Left Toe Great Wound Open Wounding Event: Gradually Appeared Status: Date Acquired: 01/24/2014 Comorbid Cataracts, Chronic sinus Weeks Of Treatment: 18 History: problems/congestion, Congestive Heart Clustered Wound: No Failure, Coronary Artery Disease, Hypertension, Myocardial Infarction, Peripheral Arterial Disease, Type II Diabetes, Neuropathy Photos Photo Uploaded By: [name] Gurney, RN, BSN, [name] on 09/05/2014 18:27:32 Wound Measurements Length: (cm) 0.5 Width: (cm) 0.4 Depth: (cm) 0.2 Area: (cm) 0.157 Volume: (cm) 0.031 % Reduction in Area: 81.8% %  Reduction in Volume: 64% Epithelialization: Small (1-33%) Wound Description Full Thickness Without Exposed Classification: Support Structures Diabetic Severity Grade 1 (Wagner): Wound Margin: Flat and Intact Wound Bed Granulation Amount: None Present (0%) Exposed Structure [name], [name] ([id number]) Necrotic Amount: Large (67-100%) Fascia Exposed: No Necrotic Quality: Adherent Slough Fat Layer Exposed: No Tendon Exposed: No Muscle Exposed: No Joint Exposed: No Bone Exposed: No Limited to Skin Breakdown Periwound Skin Texture Texture Color No Abnormalities Noted: No No Abnormalities  Noted: No Moisture No Abnormalities Noted: No Dry / Scaly: Yes Moist: Yes Wound Preparation Ulcer Cleansing: Rinsed/Irrigated with Saline Topical Anesthetic Applied: Other: lidociane 4%, Treatment Notes Wound #3 (Left Toe Great) 1. Cleansed with: Clean wound with Normal Saline 4. Dressing Applied: Prisma Ag 5. Secondary Dressing Applied Bordered Foam Dressing Electronic Signature(s) Signed: 09/05/2014 6:31:19 PM By: [name] Gurney, RN, BSN, Kim RN, BSN Entered By: [name] Gurney, RN, BSN, [name] on 09/05/2014 11:53:34 Severino, Raoul [name] ([id number]) -------------------------------------------------------------------------------- Vitals Details Patient Name: [name], [name] Date of Service: 09/05/2014 11:30 AM Medical Record Number: [id number] Patient Account Number: [id number] Date of Birth/Sex: 12-Mar-1920 (81 y.o. Male) Treating RN: [name] Primary Care Physician: [name] Other Clinician: Referring Physician: Lindwood [name] Treating Physician/Extender: [name] in Treatment: 18 Vital Signs Time Taken: 11:48 Pulse (bpm): 88 Height (in): 70 Respiratory Rate (breaths/min): 20 Weight (lbs): 200 Blood Pressure (mmHg): 101/44 Body Mass Index (BMI): 28.7 Reference Range: 80 - 120 mg / dl Electronic Signature(s) Signed: 09/05/2014 6:31:19 PM By: [name] Gurney, RN, BSN, Kim RN, BSN Entered By: [name] Gurney, RN, BSN, [name] on 09/05/2014 11:49:07

## 2014-09-12 ENCOUNTER — Encounter: Payer: Medicare Other | Admitting: Surgery

## 2014-09-12 DIAGNOSIS — L97323 Non-pressure chronic ulcer of left ankle with necrosis of muscle: Secondary | ICD-10-CM | POA: Diagnosis not present

## 2014-09-13 NOTE — Progress Notes (Signed)
RONALDO, CRILLY ([id number]) Visit Report for 09/12/2014 Arrival Information Details Patient Name: [name], [name]. Date of Service: 09/12/2014 1:00 PM Medical Record Number: [id number] Patient Account Number: [id number] Date of Birth/Sex: February 09, 1920 (81 y.o. Male) Treating RN: Huel Coventry Primary Care Physician: Lindwood Qua Other Clinician: Referring Physician: Lindwood Qua Treating Physician/Extender: Rudene Re in Treatment: 19 Visit Information History Since Last Visit Added or deleted any medications: No Patient Arrived: Wheel Chair Any new allergies or adverse reactions: No Arrival Time: 13:08 Had a fall or experienced change in No Accompanied By: son activities of daily living that may affect Transfer Assistance: None risk of falls: Patient Identification Verified: Yes Signs or symptoms of abuse/neglect since last No Secondary Verification Process Yes visito Completed: Hospitalized since last visit: No Patient Requires Transmission-Based No Has Dressing in Place as Prescribed: Yes Precautions: Pain Present Now: No Patient Has Alerts: Yes Patient Alerts: 04/27/13 ABI (L) 0.91 (R) 0.71 Electronic Signature(s) Signed: 09/12/2014 5:52:12 PM By: Elliot Gurney, RN, BSN, Kim RN, BSN Entered By: Elliot Gurney, RN, BSN, Kim on 09/12/2014 13:09:06 [name] ([id number]) -------------------------------------------------------------------------------- Encounter Discharge Information Details Patient Name: [name], [name] Date of Service: 09/12/2014 1:00 PM Medical Record Number: [id number] Patient Account Number: [id number] Date of Birth/Sex: 01/24/1920 (81 y.o. Male) Treating RN: Huel Coventry Primary Care Physician: Lindwood Qua Other Clinician: Referring Physician: Lindwood Qua Treating Physician/Extender: Rudene Re in Treatment: 54 Encounter Discharge Information Items Discharge Pain Level: 0 Discharge Condition: Stable Ambulatory Status:  Wheelchair Discharge Destination: Home Transportation: Private Auto Accompanied By: son Schedule Follow-up Appointment: Yes Medication Reconciliation completed and provided to Patient/Care Yes Avice [place]: Provided on Clinical Summary of Care: 09/12/2014 Form Type Recipient Paper Patient AE Electronic Signature(s) Signed: 09/12/2014 5:52:12 PM By: Elliot Gurney RN, BSN, Kim RN, BSN Previous Signature: 09/12/2014 1:56:55 PM Version By: Gwenlyn Perking Entered By: Elliot Gurney RN, BSN, Kim on 09/12/2014 14:03:09 [name] ([id number]) -------------------------------------------------------------------------------- Lower Extremity Assessment Details Patient Name: [name], [name] Date of Service: 09/12/2014 1:00 PM Medical Record Number: [id number] Patient Account Number: [id number] Date of Birth/Sex: February 08, 1920 (81 y.o. Male) Treating RN: Huel Coventry Primary Care Physician: Lindwood Qua Other Clinician: Referring Physician: Lindwood Qua Treating Physician/Extender: Rudene Re in Treatment: 19 Vascular Assessment Pulses: Posterior Tibial Dorsalis Pedis Palpable: [Left:No] Doppler: [Left:Monophasic] Extremity colors, hair growth, and conditions: Extremity Color: [Left:Pale] Hair Growth on Extremity: [Left:No] Temperature of Extremity: [Left:Warm] Capillary Refill: [Left:> 3 seconds] Toe Nail Assessment Left: Right: Thick: Yes Discolored: Yes Deformed: Yes Improper Length and Hygiene: Yes Electronic Signature(s) Signed: 09/12/2014 5:52:12 PM By: Elliot Gurney, RN, BSN, Kim RN, BSN Entered By: Elliot Gurney, RN, BSN, Kim on 09/12/2014 13:20:09 [name] ([id number]) -------------------------------------------------------------------------------- Multi Wound Chart Details Patient Name: [name], [name] Date of Service: 09/12/2014 1:00 PM Medical Record Number: [id number] Patient Account Number: [id number] Date of Birth/Sex: 04-18-1920 (81 y.o. 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] Comorbid History: [1:Cataracts, Chronic sinus N/A problems/congestion, Congestive Heart Failure, Coronary Artery Disease, Hypertension, Myocardial Infarction, Peripheral Arterial Disease, Type II Diabetes, Neuropathy] [3:N/A] Date Acquired: [1:01/24/2014] [2:01/24/2014] [3:01/24/2014] Weeks of Treatment: [  1:19] [2:19] [3:19] Wound Status: [1:Open] [2:Open] [3:Open] Clustered Wound: [1:No] [2:Yes] [3:No] Pending Amputation on Yes [2:No] [3:No] Presentation: Measurements L x W x D 7.5x4x0.3 [2:0.4x0.3x0.2] [3:0.6x0.5x0.2] (cm) Area (cm) : [1:23.562] [2:0.094] [3:0.236] Volume (cm) : [1:7.069] [2:0.019] [3:0.047] % Reduction in Area: [1:4.70%] [2:98.30%] [3:72.70%] % Reduction in Volume: 4.70% [2:96.60%] [3:45.30%] Classification: [1:Full Thickness With Exposed Support Structures] [2:Full Thickness Without Exposed Support Structures] [3:Full Thickness Without Exposed Support Structures] HBO Classification: [1:Grade 2] [2:N/A] [3:N/A] Exudate Amount: [1:Large] [2:N/A] [3:N/A] Exudate Type: [1:Serosanguineous] [2:N/A] [3:N/A] Exudate Color: red, brown N/A N/A Wound Margin: Distinct, outline attached N/A N/A Granulation Amount: Large (67-100%) N/A N/A Granulation Quality: Pink N/A N/A Necrotic Amount: Small (1-33%) N/A N/A Exposed Structures: Fascia: No  N/A N/A Fat: No Tendon: No Muscle: No Joint: No Bone: No Limited to Skin Breakdown Epithelialization: Medium (34-66%) N/A N/A Periwound Skin Texture: Edema: No No Abnormalities Noted No Abnormalities Noted Excoriation: No Induration: No Callus: No Crepitus: No Fluctuance: No Friable: No Rash: No Scarring: No Periwound Skin Maceration: Yes No Abnormalities Noted No Abnormalities Noted Moisture: Moist: Yes Dry/Scaly: No Periwound Skin Color: Atrophie Blanche: No No Abnormalities Noted No Abnormalities Noted Cyanosis: No Ecchymosis: No Erythema: No Hemosiderin Staining: No Mottled: No Pallor: No Rubor: No Tenderness on No No No Palpation: Wound Preparation: Ulcer Cleansing: N/A N/A Rinsed/Irrigated with Saline Topical Anesthetic Applied: Other: lidocaine 4% Treatment Notes Electronic Signature(s) Signed: 09/12/2014 5:52:12 PM By: Elliot Gurney, RN, BSN, Kim RN, BSN Entered By: Elliot Gurney, RN, BSN, Kim on 09/12/2014 13:23:35 HSIAO, [name] ([id number]) PHILLIPS, GOULETTE ([id number]) -------------------------------------------------------------------------------- Multi-Disciplinary Care Plan Details Patient Name: Ingles, Yovany L. Date of Service: 09/12/2014 1:00 PM Medical Record Number: [id number] Patient Account Number: [id number] Date of Birth/Sex: 06-27-1920 (81 y.o. Male) Treating RN: Huel Coventry Primary Care Physician: Lindwood Qua Other Clinician: Referring Physician: Lindwood Qua Treating Physician/Extender: Rudene Re in Treatment: 1 Active Inactive Abuse / Safety / Falls / Self Care Management Nursing Diagnoses: Potential for falls Goals: Patient will remain injury free Date Initiated: 04/28/2014 Goal Status: Active Interventions: Assess fall risk on admission and as needed Notes: Necrotic Tissue Nursing Diagnoses: Impaired tissue integrity related to necrotic/devitalized tissue Goals: Necrotic/devitalized tissue will be minimized in the wound  bed Date Initiated: 04/28/2014 Goal Status: Active Interventions: Provide education on necrotic tissue and debridement process Treatment Activities: Apply topical anesthetic as ordered : 09/12/2014 Notes: Nutrition Nursing Diagnoses: Potential for alteratiion in Nutrition/Potential for imbalanced nutrition Wherry, Neale L. ([id number]) Goals: Patient/caregiver agrees to and verbalizes understanding of need to use nutritional supplements and/or vitamins as prescribed Date Initiated: 04/28/2014 Goal Status: Active Interventions: Assess patient nutrition upon admission and as needed per policy Notes: Orientation to the Wound Care Program Nursing Diagnoses: Knowledge deficit related to the wound healing center program Goals: Patient/caregiver will verbalize understanding of the Wound Healing Center Program Date Initiated: 04/28/2014 Goal Status: Active Interventions: Provide education on orientation to the wound center Notes: Wound/Skin Impairment Nursing Diagnoses: Impaired tissue integrity Goals: Ulcer/skin breakdown will have a volume reduction of 30% by week 4 Date Initiated: 04/28/2014 Goal Status: Active Interventions: Assess ulceration(s) every visit Notes: Electronic Signature(s) Signed: 09/12/2014 5:52:12 PM By: Elliot Gurney, RN, BSN, Kim RN, BSN Entered By: Elliot Gurney, RN, BSN, Kim on 09/12/2014 13:23:09 Kemler, [name] ([id number]) -------------------------------------------------------------------------------- Pain Assessment Details Patient Name: [name], Elmond L. Date of Service: 09/12/2014 1:00 PM Medical Record Number: [id number] Patient Account Number: [id number] Date of Birth/Sex: February 05, 1920 (81 y.o. Male) Treating RN: Huel Coventry Primary Care Physician: Lindwood Qua Other  Clinician: Referring Physician: Lindwood Qua Treating Physician/Extender: Rudene Re in Treatment: 19 Active Problems Location of Pain Severity and Description of Pain Patient Has Paino  No Site Locations Pain Management and Medication Current Pain Management: Electronic Signature(s) Signed: 09/12/2014 5:52:12 PM By: Elliot Gurney, RN, BSN, Kim RN, BSN Entered By: Elliot Gurney, RN, BSN, Kim on 09/12/2014 13:09:13 [name] ([id number]) -------------------------------------------------------------------------------- Patient/Caregiver Education Details Patient Name: [name], [name] Date of Service: 09/12/2014 1:00 PM Medical Record Number: [id number] Patient Account Number: [id number] Date of Birth/Gender: April 20, 1920 (82 y.o. Male) Treating RN: Huel Coventry Primary Care Physician: Lindwood Qua Other Clinician: Referring Physician: Lindwood Qua Treating Physician/Extender: Rudene Re in Treatment: 51 Education Assessment Education Provided To: Patient Education Topics Provided Wound/Skin Impairment: Handouts: Caring for Your Ulcer, Other: continue wound care as prescribed Methods: Demonstration Responses: State content correctly Electronic Signature(s) Signed: 09/12/2014 5:52:12 PM By: Elliot Gurney, RN, BSN, Kim RN, BSN Entered By: Elliot Gurney, RN, BSN, Kim on 09/12/2014 14:03:30 [name] ([id number]) -------------------------------------------------------------------------------- Wound Assessment Details Patient Name: [name], [name] Date of Service: 09/12/2014 1:00 PM Medical Record Number: [id number] Patient Account Number: [id number] Date of Birth/Sex: [birth year] (81 y.o. Male) Treating RN: Huel Coventry Primary Care Physician: Lindwood Qua Other Clinician: Referring Physician: Lindwood Qua Treating Physician/Extender: Rudene Re in Treatment: 19 Wound Status Wound Number: 1 Primary Arterial Insufficiency Ulcer Etiology: Wound Location: Left Achilles Wound Open Wounding Event: Gradually Appeared Status: Date Acquired: 01/24/2014 Comorbid Cataracts, Chronic sinus Weeks Of Treatment: 19 History: problems/congestion, Congestive Heart Clustered  Wound: No Failure, Coronary Artery Disease, Pending Amputation On Presentation Hypertension, Myocardial Infarction, Peripheral Arterial Disease, Type II Diabetes, Neuropathy Wound Measurements Length: (cm) 7.5 Width: (cm) 4 Depth: (cm) 0.3 Area: (cm) 23.562 Volume: (cm) 7.069 % Reduction in Area: 4.7% % Reduction in Volume: 4.7% Epithelialization: Medium (34-66%) Wound Description Full Thickness With Exposed Foul Odor A Classification: Support Structures Diabetic Severity Grade 2 (Wagner): Wound Margin: Distinct, outline attached Exudate Amount: Large Exudate Type: Serosanguineous Exudate Color: red, brown fter Cleansing: No Wound Bed Granulation Amount: Large (67-100%) Exposed Structure Granulation Quality: Pink Fascia Exposed: No Necrotic Amount: Small (1-33%) Fat Layer Exposed: No Necrotic Quality: Adherent Slough Tendon Exposed: No Muscle Exposed: No Joint Exposed: No Bone Exposed: No Limited to Skin Breakdown Periwound Skin Texture [name], [name] ([id number]) Texture Color No Abnormalities Noted: No No Abnormalities Noted: No Callus: No Atrophie Blanche: No Crepitus: No Cyanosis: No Excoriation: No Ecchymosis: No Fluctuance: No Erythema: No Friable: No Hemosiderin Staining: No Induration: No Mottled: No Localized Edema: No Pallor: No Rash: No Rubor: No Scarring: No Moisture No Abnormalities Noted: No Dry / Scaly: No Maceration: Yes Moist: Yes Wound Preparation Ulcer Cleansing: Rinsed/Irrigated with Saline Topical Anesthetic Applied: Other: lidocaine 4%, Treatment Notes Wound #1 (Left Achilles) 1. Cleansed with: Clean wound with Normal Saline 2. Anesthetic Topical 2.5%/2.5% Lidocaine and Prilocaine Cream 4. Dressing Applied: Aquacel Ag 8. Negative Pressure Wound Therapy Wound Vac to wound continuously at 159mm/hg pressure Black Foam Number of foam/gauze pieces used in the dressing = Change canister as needed. Notes aquacel  under foam Electronic Signature(s) Signed: 09/12/2014 5:52:12 PM By: Elliot Gurney, RN, BSN, Kim RN, BSN Entered By: Elliot Gurney, RN, BSN, Kim on 09/12/2014 13:21:11 Juste, [name] ([id number]) -------------------------------------------------------------------------------- Wound Assessment Details Patient Name: [name], [name] Date of Service: 09/12/2014 1:00 PM Medical Record Number: [id number] Patient Account Number: [id number] Date of Birth/Sex: 1920/10/25 (81 y.o. Male) Treating RN: Huel Coventry Primary Care Physician: Lindwood Qua Other Clinician: Referring Physician: Lindwood Qua Treating  Physician/Extender: Rudene Re in Treatment: 19 Wound Status Wound Number: 2 Primary Etiology: Arterial Insufficiency Ulcer Wound Location: Left, Lateral Calcaneous Wound Status: Open Wounding Event: Gradually Appeared Date Acquired: 01/24/2014 Weeks Of Treatment: 19 Clustered Wound: Yes Wound Measurements Length: (cm) 0.4 Width: (cm) 0.3 Depth: (cm) 0.2 Area: (cm) 0.094 Volume: (cm) 0.019 % Reduction in Area: 98.3% % Reduction in Volume: 96.6% Wound Description Full Thickness Without Exposed Classification: Support Structures Periwound Skin Texture Texture Color No Abnormalities Noted: No No Abnormalities Noted: No Moisture No Abnormalities Noted: No Treatment Notes Wound #2 (Left, Lateral Calcaneous) 1. Cleansed with: Clean wound with Normal Saline 2. Anesthetic Topical 2.5%/2.5% Lidocaine and Prilocaine Cream 4. Dressing Applied: Aquacel Ag 8. Negative Pressure Wound Therapy Wound Vac to wound continuously at 127mm/hg pressure Black Foam Number of foam/gauze pieces used in the dressing = Change canister as needed. [name], [name] ([id number]) Notes aquacel under foam Electronic Signature(s) Signed: 09/12/2014 5:52:12 PM By: Elliot Gurney, RN, BSN, Kim RN, BSN Entered By: Elliot Gurney, RN, BSN, Kim on 09/12/2014 13:20:50 Miyazaki, [name]  ([id number]) -------------------------------------------------------------------------------- Wound Assessment Details Patient Name: Kell, Macyn L. Date of Service: 09/12/2014 1:00 PM Medical Record Number: [id number] Patient Account Number: [id number] Date of Birth/Sex: 04-Sep-1920 (81 y.o. Male) Treating RN: Huel Coventry Primary Care Physician: Lindwood Qua Other Clinician: Referring Physician: Lindwood Qua Treating Physician/Extender: Rudene Re in Treatment: 19 Wound Status Wound Number: 3 Primary Etiology: Arterial Insufficiency Ulcer Wound Location: Left Toe Great Wound Status: Open Wounding Event: Gradually Appeared Date Acquired: 01/24/2014 Weeks Of Treatment: 19 Clustered Wound: No Wound Measurements Length: (cm) 0.6 Width: (cm) 0.5 Depth: (cm) 0.2 Area: (cm) 0.236 Volume: (cm) 0.047 % Reduction in Area: 72.7% % Reduction in Volume: 45.3% Wound Description Full Thickness Without Exposed Classification: Support Structures Periwound Skin Texture Texture Color No Abnormalities Noted: No No Abnormalities Noted: No Moisture No Abnormalities Noted: No Treatment Notes Wound #3 (Left Toe Great) 1. Cleansed with: Clean wound with Normal Saline 4. Dressing Applied: Prisma Ag 5. Secondary Dressing Applied Bordered Foam Dressing Electronic Signature(s) Signed: 09/12/2014 5:52:12 PM By: Elliot Gurney, RN, BSN, Kim RN, BSN Entered By: Elliot Gurney, RN, BSN, Kim on 09/12/2014 13:20:51 Dechaine, [name] ([id number]) Wolden, Kemoni Elbert Ewings ([id number]) -------------------------------------------------------------------------------- Vitals Details Patient Name: Childers, Daquann L. Date of Service: 09/12/2014 1:00 PM Medical Record Number: [id number] Patient Account Number: [id number] Date of Birth/Sex: Jun 18, 1920 (81 y.o. Male) Treating RN: Huel Coventry Primary Care Physician: Lindwood Qua Other Clinician: Referring Physician: Lindwood Qua Treating Physician/Extender: Rudene Re in Treatment: 19 Vital Signs Time Taken: 13:09 Pulse (bpm): 88 Height (in): 70 Respiratory Rate (breaths/min): 20 Weight (lbs): 200 Blood Pressure (mmHg): 103/39 Body Mass Index (BMI): 28.7 Reference Range: 80 - 120 mg / dl Notes Patient to follow up with PCP re low BP. Electronic Signature(s) Signed: 09/12/2014 5:52:12 PM By: Elliot Gurney, RN, BSN, Kim RN, BSN Entered By: Elliot Gurney, RN, BSN, Kim on 09/12/2014 13:12:19

## 2014-09-13 NOTE — Progress Notes (Addendum)
[name], [name] ([id number]) Visit Report for 09/12/2014 Chief Complaint Document Details Patient Name: [name], [name] Date of Service: 09/12/2014 1:00 PM Medical Record Number: [id number] Patient Account Number: [id number] Date of Birth/Sex: 27-Apr-1920 (81 y.o. Male) Treating RN: Huel Coventry Primary Care Physician: Lindwood Qua Other Clinician: Referring Physician: Lindwood Qua Treating Physician/Extender: Rudene Re in Treatment: 19 Information Obtained from: Patient Chief Complaint Patient presents to the wound care center for a consult due non healing wound 81 year old gentleman who comes with a history of having some ulcerated areas on his heels since February 2016 and then a large injury to his left posterior heel and ankle since about a month. Electronic Signature(s) Signed: 09/12/2014 1:49:22 PM By: Evlyn Kanner MD, FACS Entered By: Evlyn Kanner on 09/12/2014 13:49:22 [place], [name] ([id number]) -------------------------------------------------------------------------------- Debridement Details Patient Name: [name], [name] Date of Service: 09/12/2014 1:00 PM Medical Record Number: [id number] Patient Account Number: [id number] Date of Birth/Sex: 1920/10/23 (81 y.o. Male) Treating RN: Huel Coventry Primary Care Physician: Lindwood Qua Other Clinician: Referring Physician: Lindwood Qua Treating Physician/Extender: Rudene Re in Treatment: 19 Debridement Performed for Wound #1 Left Achilles Assessment: Performed By: Physician Tristan Schroeder., MD Debridement: Debridement Pre-procedure Yes Verification/Time Out Taken: Start Time: 13:25 Pain Control: Other : lidocaine 4% Level: Skin/Subcutaneous Tissue Total Area Debrided (L x 5 (cm) x 3 (cm) = 15 (cm) W): Tissue and other Viable, Non-Viable, Exudate, Fibrin/Slough, Subcutaneous material debrided: Instrument: Curette Bleeding: Minimum Hemostasis Achieved: Pressure End Time:  13:26 Procedural Pain: 0 Post Procedural Pain: 0 Response to Treatment: Procedure was tolerated well Post Debridement Measurements of Total Wound Length: (cm) 7.5 Width: (cm) 4 Depth: (cm) 0.3 Volume: (cm) 7.069 Post Procedure Diagnosis Same as Pre-procedure Electronic Signature(s) Signed: 09/12/2014 1:49:01 PM By: Evlyn Kanner MD, FACS Signed: 09/12/2014 5:52:12 PM By: Elliot Gurney RN, BSN, Kim RN, BSN Entered By: Evlyn Kanner on 09/12/2014 13:49:01 [name] ([id number]) -------------------------------------------------------------------------------- Debridement Details Patient Name: [name], [name] Date of Service: 09/12/2014 1:00 PM Medical Record Number: [id number] Patient Account Number: [id number] Date of Birth/Sex: 03-Mar-1920 (81 y.o. Male) Treating RN: Huel Coventry Primary Care Physician: Lindwood Qua Other Clinician: Referring Physician: Lindwood Qua Treating Physician/Extender: Rudene Re in Treatment: 19 Debridement Performed for Wound #3 Left Toe Great Assessment: Performed By: Physician Tristan Schroeder., MD Debridement: Debridement Pre-procedure Yes Verification/Time Out Taken: Start Time: 13:25 Pain Control: Other : lidocaine 4% Level: Skin/Subcutaneous Tissue Total Area Debrided (L x 0.6 (cm) x 0.5 (cm) = 0.3 (cm) W): Tissue and other Viable, Non-Viable, Exudate, Fibrin/Slough, Subcutaneous material debrided: Instrument: Curette Bleeding: Minimum Hemostasis Achieved: Pressure End Time: 13:26 Procedural Pain: 0 Post Procedural Pain: 0 Response to Treatment: Procedure was tolerated well Post Debridement Measurements of Total Wound Length: (cm) 0.6 Width: (cm) 0.5 Depth: (cm) 0.2 Volume: (cm) 0.047 Post Procedure Diagnosis Same as Pre-procedure Electronic Signature(s) Signed: 09/12/2014 1:49:16 PM By: Evlyn Kanner MD, FACS Signed: 09/12/2014 5:52:12 PM By: Elliot Gurney RN, BSN, Kim RN, BSN Entered By: Evlyn Kanner on 09/12/2014  13:49:16 [name], [name] ([id number]) -------------------------------------------------------------------------------- HPI Details Patient Name: [name], [name] Date of Service: 09/12/2014 1:00 PM Medical Record Number: [id number] Patient Account Number: [id number] Date of Birth/Sex: 02-10-1920 (81 y.o. Male) Treating RN: Huel Coventry Primary Care Physician: Lindwood Qua Other Clinician: Referring Physician: Lindwood Qua Treating Physician/Extender: Rudene Re in Treatment: 98 History of Present Illness HPI Description: 81 year old gentleman who was known to be a diabetic for many years has peripheral neuropathy recently went to his primary  care doctor for a punctured wound on his left heel which was something he had noticed. The patient then was referred to a podiatrist who referred him to Dr. Wyn Quaker in the vascular surgery department and I understand the procedure was done on 03/14/2014 with a left lower extremity vascular procedure and stenting was done. Details of this are not available at the present time. The patient has been applying Neosporin to his leg and has not had any wound care addressed so far. patient has also had a history of coronary artery disease in the past and hasn't had a CABG and a pacemaker placement in the remote past.Other details and notes are pending. the patient is not in pain lives alone and has some home health and other aides coming to help him with his daily chores and meals. reviewing the vascular notes I understand the procedure was done on 03/14/2014 and he was operated by Dr. Wyn Quaker. he had a catheter placement to the left peroneal artery and a aortogram and selective left lower extremity angiogram was done. He also had a percutaneous transluminal angioplasty of the left peroneal artery and the tibioperoneal trunk. The distal SFA and above-knee popliteal artery were also angioplastied. A subcutaneous stent placement to the distal superficial  femoral artery was also done. 05/05/2014 -- the patient has not had any change in his health and after much consideration is decided that he does not want HBOT as he is claustrophobic and was unable to tolerate being in the chamber for 90 minutes. I have discussed with him that his most recent x-ray of the foot shows that he has the distal phalanx of the left great toe showing changes with osteomyelitis cannot be excluded at that site. He tells me that he cannot have an MRI because of his defibrillator and hence we will order a triple phase bone scan. I have also reviewed his culture report which shows several organisms and he has sensitivity to tetracycline and we have recommended he takes this for 14 days and this has been given to him on 05/02/2014 05/12/2014 the bone scan done on 05/10/2014 shows #1 findings are worrisome for osteomyelitis involving the calcaneus of the left foot, #2 increase uptake localizing to the left second and third toe on all 3 phases. Cannot rule out osteomyelitis in this area. And #3 left foot cellulitis. 05/19/2014 -- reviewed several reports which we have received back on [name]. #1 chest x-ray done on April 21 shows chronic changes in the left base no acute findings. #2 his CBC is within normal limits. #3 hemoglobin A1c was 8.5%. #4 EKG done on 26 April was within normal limits due to his electronic ventricular pacemaker. 05/26/2014 -- he has had his PICC line placed and is taking vancomycin daily basis. He is to start hyperbaric oxygen therapy on this coming Monday. 06/09/2014 -- he saw Dr. Sampson Goon yesterday and besides his IV antibiotic I believe a oral antibiotic was TIRAS, BIANCHINI. ([id number]) also given and this may be Cipro. Patient is feeling fine otherwise does not have any symptoms though his blood pressure this morning in the wound center has been 90/50. We will check his blood pressure in the supine position. 06/16/2014 - 89yo undergoing  HBO for Wagner 3 L DFUs and arterial insufficiency. Complained of worsening fatigue yesterday after HBO. Feels better today. Has appointment with PCP this afternoon. He wishes to hold off on HBO until next week. No significant pain. No fever or chills. Stable drainage. 06/23/2014 [name]  has taken a break from HBO T and has been feeling a little better. He was seen by the nurse practitioner at his PCPs office and at that time his vitals were stable and they did get some EKG done which was within normal limits. There have sent out some lab work which we still have to receive reports. He is going to see his PCP back this afternoon and will be seeing Dr. Sampson Goon tomorrow for review. addendum: we have received labs from his PCPs office and note that his potassium is high at 5.4 and his BUN/creatinine is slightly raised. His blood sugar was 216. His total protein and albumen were 5.9 and 3 and this was low. His HandH was 10.3 and 31.4 WBC was 8.2 and his platelets were 304. CRP was 29.6 which was high. Vancomycin trough was 19.1 which was normal TSH was 3.11 which was normal 06/30/2014 -- [name] feels weak today and he feels his stomach is acting up and his not feeling up to doing hyperbaric oxygen therapy. He would like to take a break today and tomorrow and resume on Monday. He is on vancomycin and Levaquin as per ID and he has an appointment to see the vascular surgeons in about 10 days' time. 07/07/2014 -- [name] has been feeling weak all along and he has decided that he is not going to continue with hyperbaric oxygen therapy and a longer period. He had finished 16 of his 40 treatments and will not take anymore. He has an appointment with ID tomorrow and also his vascular surgeons and I will have a conference with both of them regarding his further care. 07/14/2014 -- over the last week I have contacted Dr. Sampson Goon and Dr. Wyn Quaker and discussed his care. Dr. Wyn Quaker agrees that he will take him to  the OR and debrided his wound and use a wound VAC. Dr. Sampson Goon has altered his anti-biotic regimen - vancomycin has been stopped and he is on oral levofloxacin. The patient's surgery is scheduled for July 7. 07/21/2014 -- he continues to feel unwell and has a lot of GI disturbances to probably his antibiotics. He also has developed a new blister on his left fourth toe and is concerned about his upcoming surgery which is scheduled for next Thursday. 08/04/2014 -- he was taken to the OR on 07/28/2014 and a debridement of the left heel was done of skin and soft tissue and the exposed calcaneum to remove all nonviable tissue. A wound VAC was then applied at the end of the procedure. 08/12/2014 -- he was seen by Dr. Sampson Goon today and he is going to continue one more week of Levaquin and then stop it and see him back in 3 weeks' time. I have reviewed his notes from today. 08/29/2014 -- he saw Dr. Wyn Quaker his surgeon who did not have any other suggestions except for me to proceed with the skin substitute when ready. 09/05/2014 -- he saw Dr. Sampson Goon last week and he has done some tests and is awaiting his decision regarding putting him on further antibiotics. [name], [name] ([id number]) 09/12/2014 -- he was willing to have a plastic surgery opinion after giving it careful thought. We will set him up for this at [facility] as per his choice. Electronic Signature(s) Signed: 09/12/2014 1:50:04 PM By: Evlyn Kanner MD, FACS Entered By: Evlyn Kanner on 09/12/2014 13:50:04 [place], [name] ([id number]) -------------------------------------------------------------------------------- Physical Exam Details Patient Name: Klump, Hurbert L. Date of Service: 09/12/2014 1:00 PM Medical Record Number: [id number] Patient  Account Number: [id number] Date of Birth/Sex: Mar 10, 1920 (81 y.o. Male) Treating RN: Huel Coventry Primary Care Physician: Lindwood Qua Other Clinician: Referring Physician: Lindwood Qua Treating Physician/Extender: Rudene Re in Treatment: 19 Constitutional . Pulse regular. Respirations normal and unlabored. Afebrile. . Eyes Nonicteric. Reactive to light. Ears, Nose, Mouth, and Throat Lips, teeth, and gums WNL.Marland Kitchen Moist mucosa without lesions . Neck supple and nontender. No palpable supraclavicular or cervical adenopathy. Normal sized without goiter. Respiratory WNL. No retractions.. Breath sounds WNL, No rubs, rales, rhonchi, or wheeze.. Cardiovascular Heart rhythm and rate regular, no murmur or gallop.. Pedal Pulses WNL. No clubbing, cyanosis or edema. Chest Breasts symmetical and no nipple discharge.. Breast tissue WNL, no masses, lumps, or tenderness.. Lymphatic No adneopathy. No adenopathy. No adenopathy. Musculoskeletal Adexa without tenderness or enlargement.. Digits and nails w/o clubbing, cyanosis, infection, petechiae, ischemia, or inflammatory conditions.. Integumentary (Hair, Skin) No suspicious lesions. No crepitus or fluctuance. No peri-wound warmth or erythema. No masses.Marland Kitchen Psychiatric Judgement and insight Intact.. No evidence of depression, anxiety, or agitation.. Notes the wounds look about the same with slough needed sharp debridement both at the calcaneum and at the toe. He continues to have a lot of maceration of the skin and we are working on trying to control this. Electronic Signature(s) Signed: 09/12/2014 1:50:39 PM By: Evlyn Kanner MD, FACS Entered By: Evlyn Kanner on 09/12/2014 13:50:39 Currey, [name] ([id number]) -------------------------------------------------------------------------------- Physician Orders Details Patient Name: Phariss, Hafiz L. Date of Service: 09/12/2014 1:00 PM Medical Record Number: [id number] Patient Account Number: [id number] Date of Birth/Sex: 1920-03-16 (81 y.o. 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 o May Shower, gently pat wound dry prior to applying new dressing. o May shower with protection. Anesthetic Wound #1 Left Achilles o Topical Lidocaine 4% cream applied to wound bed prior to debridement Wound #2 Left,Lateral Calcaneous o Topical Lidocaine 4% cream applied to wound bed prior to debridement Wound #3 Left Toe Great o Topical Lidocaine 4% cream applied to wound bed prior to debridement Skin Barriers/Peri-Wound Care Wound #1 Left Achilles o Skin Prep Wound #2 Left,Lateral Calcaneous o Skin Prep Wound #3 Left Toe Great o Skin Prep Primary Wound Dressing Mohrmann, Yoshi L. ([id number]) Wound #1 Left Achilles o Aquacel Ag Wound #2 Left,Lateral Calcaneous o Aquacel Ag Wound #3 Left Toe Great o Prisma Ag Secondary Dressing Wound #1 Left Achilles o Gauze and Kerlix/Conform Wound #2 Left,Lateral Calcaneous o Boardered Foam Dressing Dressing Change Frequency Wound #1 Left Achilles o Change Dressing Monday, Wednesday, Friday Wound #2 Left,Lateral Calcaneous o Change Dressing Monday, Wednesday, Friday Wound #3 Left Toe Great o Change Dressing Monday, Wednesday, Friday Follow-up Appointments Wound #1 Left Achilles o Return Appointment in 1 week. Wound #2 Left,Lateral Calcaneous o Return Appointment in 1 week. Wound #3 Left Toe  Great o Return Appointment in 1 week. Home Health Wound #1 Left Achilles o Continue Home Health Visits - [place] - To change  Wednesday, Friday. o Continue Home Health Visits - [place] - To change Wednesday, Friday. o Home Health Nurse may visit PRN to address patientos wound care needs. o Home Health Nurse may visit PRN to address patientos wound care needs. o FACE TO FACE ENCOUNTER: MEDICARE and MEDICAID PATIENTS: I certify that this patient is under my care and that I had a face-to-face encounter that meets the physician face-to-face encounter requirements with this patient on this date. The encounter with the patient was in whole or in part for the following MEDICAL CONDITION: (primary reason for Home Healthcare) MEDICAL NECESSITY: I certify, that based on my findings, NURSING services are a medically necessary home health service. HOME BOUND STATUS: I certify that my clinical findings [name], BANGURA. ([id number]) support that this patient is homebound (i.e., Due to illness or injury, pt requires aid of supportive devices such as crutches, cane, wheelchairs, walkers, the use of special transportation or the assistance of another person to leave their place of residence. There is a normal inability to leave the home and doing so requires considerable and taxing effort. Other absences are for medical reasons / religious services and are infrequent or of short duration when for other reasons). o FACE TO FACE ENCOUNTER: MEDICARE and MEDICAID PATIENTS: I certify that this patient is under my care and that I had a face-to-face encounter that meets the physician face-to-face encounter requirements with this patient on this date. The encounter with the patient was in whole or in part for the following MEDICAL CONDITION: (primary reason for Home Healthcare) MEDICAL NECESSITY: I certify, that based on my findings, NURSING services are a medically necessary home health service. HOME  BOUND STATUS: I certify that my clinical findings support that this patient is homebound (i.e., Due to illness or injury, pt requires aid of supportive devices such as crutches, cane, wheelchairs, walkers, the use of special transportation or the assistance of another person to leave their place of residence. There is a normal inability to leave the home and doing so requires considerable and taxing effort. Other absences are for medical reasons / religious services and are infrequent or of short duration when for other reasons). o If current dressing causes regression in wound condition, may D/C ordered dressing product/s and apply Normal Saline Moist Dressing daily until next Wound Healing Center / Other MD appointment. Notify Wound Healing Center of regression in wound condition at [phone]. o If current dressing causes regression in wound condition, may D/C ordered dressing product/s and apply Normal Saline Moist Dressing daily until next Wound Healing Center / Other MD appointment. Notify Wound Healing Center of regression in wound condition at [phone]. o Please direct any NON-WOUND related issues/requests for orders to patient's Primary Care Physician o Please direct any NON-WOUND related issues/requests for orders to patient's Primary Care Physician Wound #2 Left,Lateral Calcaneous o Continue Home Health Visits - [place] - To change Wednesday, Friday. o Continue Home Health Visits - [place] - To change Wednesday, Friday. o Home Health Nurse may visit PRN to address patientos wound care needs. o Home Health Nurse may visit PRN to address patientos wound care needs. o FACE TO FACE ENCOUNTER: MEDICARE and MEDICAID PATIENTS: I certify that this patient is under my care and that I had a face-to-face encounter that meets the physician face-to-face encounter requirements with this patient on this date. The encounter with the patient was in whole or in part for the  following MEDICAL CONDITION: (primary reason for Home Healthcare) MEDICAL NECESSITY: I  certify, that based on my findings, NURSING services are a medically necessary home health service. HOME BOUND STATUS: I certify that my clinical findings support that this patient is homebound (i.e., Due to illness or injury, pt requires aid of supportive devices such as crutches, cane, wheelchairs, walkers, the use of special transportation or the assistance of another person to leave their place of residence. There is a normal inability to leave the home and doing so requires considerable and taxing effort. Other absences are for medical reasons / religious services and are infrequent or of short duration when for other reasons). o FACE TO FACE ENCOUNTER: MEDICARE and MEDICAID PATIENTS: I certify that this patient is under my care and that I had a face-to-face encounter that meets the physician face-to-face encounter requirements with this patient on this date. The encounter with the patient was in whole or in part for the following MEDICAL CONDITION: (primary reason for Home Healthcare) RAZIEL, KOENIGS ([id number]) MEDICAL NECESSITY: I certify, that based on my findings, NURSING services are a medically necessary home health service. HOME BOUND STATUS: I certify that my clinical findings support that this patient is homebound (i.e., Due to illness or injury, pt requires aid of supportive devices such as crutches, cane, wheelchairs, walkers, the use of special transportation or the assistance of another person to leave their place of residence. There is a normal inability to leave the home and doing so requires considerable and taxing effort. Other absences are for medical reasons / religious services and are infrequent or of short duration when for other reasons). o If current dressing causes regression in wound condition, may D/C ordered dressing product/s and apply Normal Saline Moist Dressing  daily until next Wound Healing Center / Other MD appointment. Notify Wound Healing Center of regression in wound condition at [phone]. o If current dressing causes regression in wound condition, may D/C ordered dressing product/s and apply Normal Saline Moist Dressing daily until next Wound Healing Center / Other MD appointment. Notify Wound Healing Center of regression in wound condition at [phone]. o Please direct any NON-WOUND related issues/requests for orders to patient's Primary Care Physician o Please direct any NON-WOUND related issues/requests for orders to patient's Primary Care Physician Wound #3 Left Toe Great o Continue Home Health Visits - [place] - To change Wednesday, Friday. o Continue Home Health Visits - [place] - To change Wednesday, Friday. o Home Health Nurse may visit PRN to address patientos wound care needs. o Home Health Nurse may visit PRN to address patientos wound care needs. o FACE TO FACE ENCOUNTER: MEDICARE and MEDICAID PATIENTS: I certify that this patient is under my care and that I had a face-to-face encounter that meets the physician face-to-face encounter requirements with this patient on this date. The encounter with the patient was in whole or in part for the following MEDICAL CONDITION: (primary reason for Home Healthcare) MEDICAL NECESSITY: I certify, that based on my findings, NURSING services are a medically necessary home health service. HOME BOUND STATUS: I certify that my clinical findings support that this patient is homebound (i.e., Due to illness or injury, pt requires aid of supportive devices such as crutches, cane, wheelchairs, walkers, the use of special transportation or the assistance of another person to leave their place of residence. There is a normal inability to leave the home and doing so requires considerable and taxing effort. Other absences are for medical reasons / religious services and are infrequent  or of short duration when for other  reasons). o FACE TO FACE ENCOUNTER: MEDICARE and MEDICAID PATIENTS: I certify that this patient is under my care and that I had a face-to-face encounter that meets the physician face-to-face encounter requirements with this patient on this date. The encounter with the patient was in whole or in part for the following MEDICAL CONDITION: (primary reason for Home Healthcare) MEDICAL NECESSITY: I certify, that based on my findings, NURSING services are a medically necessary home health service. HOME BOUND STATUS: I certify that my clinical findings support that this patient is homebound (i.e., Due to illness or injury, pt requires aid of supportive devices such as crutches, cane, wheelchairs, walkers, the use of special transportation or the assistance of another person to leave their place of residence. There is a normal inability to leave the home and doing so requires considerable and taxing effort. Other absences are for medical reasons / religious services and are infrequent or of short duration when for other reasons). [name], [name] ([id number]) o If current dressing causes regression in wound condition, may D/C ordered dressing product/s and apply Normal Saline Moist Dressing daily until next Wound Healing Center / Other MD appointment. Notify Wound Healing Center of regression in wound condition at [phone]. o If current dressing causes regression in wound condition, may D/C ordered dressing product/s and apply Normal Saline Moist Dressing daily until next Wound Healing Center / Other MD appointment. Notify Wound Healing Center of regression in wound condition at [phone]. o Please direct any NON-WOUND related issues/requests for orders to patient's Primary Care Physician o Please direct any NON-WOUND related issues/requests for orders to patient's Primary Care Physician Negative Pressure Wound Therapy Wound #1 Left Achilles o  Wound VAC settings at 125/130 mmHg continuous pressure. Use BLACK/GREEN foam to wound cavity. Use WHITE foam to fill any tunnel/s and/or undermining. Change VAC dressing 3 X WEEK. Change canister as indicated when full. Nurse may titrate settings and frequency of dressing changes as clinically indicated. o Home Health Nurse may d/c VAC for s/s of increased infection, significant wound regression, or uncontrolled drainage. Notify Wound Healing Center at [phone]. o Apply contact layer over base of wound. o Number of foam/gauze pieces used in the dressing = o Other: - Apply Aquacel AG to lower half of wound bed and contact layer under foam. Electronic Signature(s) Signed: 09/12/2014 4:35:29 PM By: Evlyn Kanner MD, FACS Signed: 09/12/2014 5:52:12 PM By: Elliot Gurney RN, BSN, Kim RN, BSN Entered By: Elliot Gurney, RN, BSN, Kim on 09/12/2014 14:02:33 EPIC, TRIBBETT ([id number]) -------------------------------------------------------------------------------- Prescription 09/12/2014 Patient Name: [name], [name] Physician: Evlyn Kanner MD Date of Birth: 1920/09/28 NPI#: [id number] Sex: Judie Petit DEA#: [id number] Phone #: [phone] License #: Patient Address: [facility] [address] [place], [place] [address] [facility] [address], [address] [place], [place] [address] [phone] Allergies ACE Inhibitors Reaction: chest pain Severity: Severe Physician's Orders Wound VAC settings at 125/130 mmHg continuous pressure. Use BLACK/GREEN foam to wound cavity. Use WHITE foam to fill any tunnel/s and/or undermining. Change VAC dressing 3 X WEEK. Change canister as indicated when full. Nurse may titrate settings and frequency of dressing changes as clinically indicated. Signature(s): Date(s): Psychologist, prison and probation services) Signed: 09/12/2014 4:35:29 PM By: Evlyn Kanner MD, FACS Signed: 09/12/2014 5:52:12 PM By: Elliot Gurney RN, BSN, Kim RN,  BSN Entered By: Elliot Gurney, RN, BSN, Kim on 09/12/2014 14:02:34 Hefty, [name] ([id number]) --------------------------------------------------------------------------------  Problem List Details Patient Name: Rider, Braian L. Date of Service: 09/12/2014 1:00 PM Medical Record  Number: [id number] Patient Account Number: [id number] Date of Birth/Sex: January 22, 1920 (81 y.o. 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 Date of Service: 09/12/2014 1:00 PM Medical Record Number: [id number] Patient Account Number: [id number] Date of Birth/Sex: 01-25-1920 (81 y.o. Male) Treating RN: Huel Coventry Primary Care Physician: Lindwood Qua Other Clinician: Referring Physician: Lindwood Qua Treating Physician/Extender: Rudene Re in Treatment: 85 Subjective Chief Complaint Information obtained from Patient Patient presents to the wound care center for a consult due non healing wound 81 year old gentleman who comes with a history of  having some ulcerated areas on his heels since February 2016 and then a large injury to his left posterior heel and ankle since about a month. History of Present Illness (HPI) 81 year old gentleman who was known to be a diabetic for many years has peripheral neuropathy recently went to his primary care doctor for a punctured wound on his left heel which was something he had noticed. The patient then was referred to a podiatrist who referred him to Dr. Wyn Quaker in the vascular surgery department and I understand the procedure was done on 03/14/2014 with a left lower extremity vascular procedure and stenting was done. Details of this are not available at the present time. The patient has been applying Neosporin to his leg and has not had any wound care addressed so far. patient has also had a history of coronary artery disease in the past and hasn't had a CABG and a pacemaker placement in the remote past.Other details and notes are pending. the patient is not in pain lives alone and has some home health and other aides coming to help him with his daily chores and meals. reviewing the vascular notes I understand the procedure was done on 03/14/2014 and he was operated by Dr. Wyn Quaker. he had a catheter placement to the left peroneal artery and a aortogram and selective left lower extremity angiogram was done. He also had a percutaneous transluminal angioplasty of the left peroneal artery and the tibioperoneal trunk. The distal SFA and above-knee popliteal artery were also angioplastied. A subcutaneous stent placement to the distal superficial femoral artery was also done. 05/05/2014 -- the patient has not had any change in his health and after much consideration is decided that he does not want HBOT as he is claustrophobic and was unable to tolerate being in the chamber for 90 minutes. I have discussed with him that his most recent x-ray of the foot shows that he has the distal phalanx of the left great toe  showing changes with osteomyelitis cannot be excluded at that site. He tells me that he cannot have an MRI because of his defibrillator and hence we will order a triple phase bone scan. I have also reviewed his culture report which shows several organisms and he has  sensitivity to tetracycline and we have recommended he takes this for 14 days and this has been given to him on 05/02/2014 05/12/2014 the bone scan done on 05/10/2014 shows #1 findings are worrisome for osteomyelitis involving the calcaneus of the left foot, #2 increase uptake localizing to the left second and third toe on all 3 phases. Cannot rule out osteomyelitis in this area. And #3 left foot cellulitis. 05/19/2014 -- reviewed several reports which we have received back on [name]. #1 chest x-ray done on April 21 shows chronic changes in the left base no acute findings. Monteforte, Carlo L. ([id number]) #2 his CBC is within normal limits. #3 hemoglobin A1c was 8.5%. #4 EKG done on 26 April was within normal limits due to his electronic ventricular pacemaker. 05/26/2014 -- he has had his PICC line placed and is taking vancomycin daily basis. He is to start hyperbaric oxygen therapy on this coming Monday. 06/09/2014 -- he saw Dr. Sampson Goon yesterday and besides his IV antibiotic I believe a oral antibiotic was also given and this may be Cipro. Patient is feeling fine otherwise does not have any symptoms though his blood pressure this morning in the wound center has been 90/50. We will check his blood pressure in the supine position. 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 Complained of worsening fatigue yesterday after HBO. Feels better today. Has appointment with PCP this afternoon. He wishes to hold off on HBO until next week. No significant pain. No fever or chills. Stable drainage. 06/23/2014 Sayer has taken a break from HBO T and has been feeling a little better. He was seen by  the nurse practitioner at his PCPs office and at that time his vitals were stable and they did get some EKG done which was within normal limits. There have sent out some lab work which we still have to receive reports. He is going to see his PCP back this afternoon and will be seeing Dr. Sampson Goon tomorrow for review. addendum: we have received labs from his PCPs office and note that his potassium is high at 5.4 and his BUN/creatinine is slightly raised. His blood sugar was 216. His total protein and albumen were 5.9 and 3 and this was low. His HandH was 10.3 and 31.4 WBC was 8.2 and his platelets were 304. CRP was 29.6 which was high. Vancomycin trough was 19.1 which was normal TSH was 3.11 which was normal 06/30/2014 -- [name] feels weak today and he feels his stomach is acting up and his not feeling up to doing hyperbaric oxygen therapy. He would like to take a break today and tomorrow and resume on Monday. He is on vancomycin and Levaquin as per ID and he has an appointment to see the vascular surgeons in about 10 days' time. 07/07/2014 -- [name] has been feeling weak all along and he has decided that he is not going to continue with hyperbaric oxygen therapy and a longer period. He had finished 16 of his 40 treatments and will not take anymore. He has an appointment with ID tomorrow and also his vascular surgeons and I will have a conference with both of them regarding his further care. 07/14/2014 -- over the last week I have contacted Dr. Sampson Goon and Dr. Wyn Quaker and discussed his care. Dr. Wyn Quaker agrees that he will take him to the OR and debrided his wound and use a wound VAC. Dr. Sampson Goon has altered his anti-biotic regimen - vancomycin has been stopped and  he is on oral levofloxacin. The patient's surgery is scheduled for July 7. 07/21/2014 -- he continues to feel unwell and has a lot of GI disturbances to probably his antibiotics. He also has developed a new blister on his left fourth  toe and is concerned about his upcoming surgery which is scheduled for next Thursday. 08/04/2014 -- he was taken to the OR on 07/28/2014 and a debridement of the left heel was done of skin and soft tissue and the exposed calcaneum to remove all nonviable tissue. A wound VAC was then applied at the end of the procedure. KYRIN, GRATZ ([id number]) 08/12/2014 -- he was seen by Dr. Sampson Goon today and he is going to continue one more week of Levaquin and then stop it and see him back in 3 weeks' time. I have reviewed his notes from today. 08/29/2014 -- he saw Dr. Wyn Quaker his surgeon who did not have any other suggestions except for me to proceed with the skin substitute when ready. 09/05/2014 -- he saw Dr. Sampson Goon last week and he has done some tests and is awaiting his decision regarding putting him on further antibiotics. 09/12/2014 -- he was willing to have a plastic surgery opinion after giving it careful thought. We will set him up for this at [facility] as per his choice. Objective Constitutional Pulse regular. Respirations normal and unlabored. Afebrile. Vitals Time Taken: 1:09 PM, Height: 70 in, Weight: 200 lbs, BMI: 28.7, Pulse: 88 bpm, Respiratory Rate: 20 breaths/min, Blood Pressure: 103/39 mmHg. General Notes: Patient to follow up with PCP re low BP. Eyes Nonicteric. Reactive to light. Ears, Nose, Mouth, and Throat Lips, teeth, and gums WNL.Marland Kitchen Moist mucosa without lesions . Neck supple and nontender. No palpable supraclavicular or cervical adenopathy. Normal sized without goiter. Respiratory WNL. No retractions.. Breath sounds WNL, No rubs, rales, rhonchi, or wheeze.. Cardiovascular Heart rhythm and rate regular, no murmur or gallop.. Pedal Pulses WNL. No clubbing, cyanosis or edema. Chest Breasts symmetical and no nipple discharge.. Breast tissue WNL, no masses, lumps, or tenderness.. Lymphatic No adneopathy. No adenopathy. No adenopathy. Musculoskeletal Adexa without  tenderness or enlargement.. Digits and nails w/o clubbing, cyanosis, infection, petechiae, ischemia, or inflammatory conditions.Marland Kitchen [name], [name] ([id number]) Psychiatric Judgement and insight Intact.. No evidence of depression, anxiety, or agitation.. General Notes: the wounds look about the same with slough needed sharp debridement both at the calcaneum and at the toe. He continues to have a lot of maceration of the skin and we are working on trying to control this. Integumentary (Hair, Skin) No suspicious lesions. No crepitus or fluctuance. No peri-wound warmth or erythema. No masses.. Wound #1 status is Open. Original cause of wound was Gradually Appeared. The wound is located on the Left Achilles. The wound measures 7.5cm length x 4cm width x 0.3cm depth; 23.562cm^2 area and 7.069cm^3 volume. The wound is limited to skin breakdown. There is a large amount of serosanguineous drainage noted. The wound margin is distinct with the outline attached to the wound base. There is large (67-100%) pink granulation within the wound bed. There is a small (1-33%) amount of necrotic tissue within the wound bed including Adherent Slough. The periwound skin appearance exhibited: Maceration, Moist. The periwound skin appearance did not exhibit: Callus, Crepitus, Excoriation, Fluctuance, Friable, Induration, Localized Edema, Rash, Scarring, Dry/Scaly, Atrophie Blanche, Cyanosis, Ecchymosis, Hemosiderin Staining, Mottled, Pallor, Rubor, Erythema. Wound #2 status is Open. Original cause of wound was Gradually Appeared. The wound is located on the Left,Lateral Calcaneous. The wound measures 0.4cm length  x 0.3cm width x 0.2cm depth; 0.094cm^2 area and 0.019cm^3 volume. Wound #3 status is Open. Original cause of wound was Gradually Appeared. The wound is located on the Left Toe Great. The wound measures 0.6cm length x 0.5cm width x 0.2cm depth; 0.236cm^2 area and 0.047cm^3 volume. 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 REYAN, HELLE ([id number]) He is willing to see plastic surgery at [facility] and we will make the appropriate references. Till then he will come back and see me on a weekly basis. 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 There was a Skin/Subcutaneous Tissue Debridement (19147-82956) debridement with total area of 15 sq cm performed by Gerre Ranum, Ignacia Felling., MD. with the following instrument(s): Curette to remove Viable and Non-Viable tissue/material including Exudate, Fibrin/Slough, and Subcutaneous after achieving pain control using Other (lidocaine 4%). A time out was conducted prior to the start of the procedure. A Minimum amount of bleeding was controlled with Pressure. The procedure was tolerated well with a pain level of 0 throughout and a pain level of 0 following the procedure. Post Debridement Measurements: 7.5cm length x 4cm width x 0.3cm depth; 7.069cm^3 volume. Post procedure Diagnosis Wound #1: Same as Pre-Procedure Wound #3 Wound #3 is an Arterial Insufficiency Ulcer located on the Left Toe Great . There was a Skin/Subcutaneous Tissue Debridement ([id number]) debridement with total area of 0.3 sq cm performed by Bazil Dhanani, Ignacia Felling., MD. with the following instrument(s): Curette to remove Viable and Non-Viable tissue/material including Exudate, Fibrin/Slough, and Subcutaneous after achieving pain  control using Other (lidocaine 4%). A time out was conducted prior to the start of the procedure. A Minimum amount of bleeding was controlled with Pressure. The procedure was tolerated well with a pain level of 0 throughout and a pain level of 0 following the procedure. Post Debridement Measurements: 0.6cm length x 0.5cm width x 0.2cm depth; 0.047cm^3 volume. Post procedure Diagnosis Wound #3: Same as Pre-Procedure Plan Wound Cleansing: Wound #1 Left Achilles: Clean wound with Normal Saline. May Shower, gently pat wound dry prior to applying new dressing. May shower with protection. Anesthetic: Wound #1 Left Achilles: Topical Lidocaine 4% cream applied to wound bed prior to debridement Wound #2 Left,Lateral Calcaneous: Topical Lidocaine 4% cream applied to wound bed prior to debridement Wound #3 Left Toe Great: Topical Lidocaine 4% cream applied to wound bed prior to debridement Skin Barriers/Peri-Wound Care: [name], NIEMANN ([id number]) Wound #1 Left Achilles: Skin Prep Wound #2 Left,Lateral Calcaneous: Skin Prep Wound #3 Left Toe Great: Skin Prep Primary Wound Dressing: Wound #1 Left Achilles: Aquacel Ag Wound #2 Left,Lateral Calcaneous: Aquacel Ag Wound #3 Left Toe Great: Prisma Ag Secondary Dressing: Wound #1 Left Achilles: Gauze and Kerlix/Conform Wound #2 Left,Lateral Calcaneous: Boardered Foam Dressing Dressing Change Frequency: Wound #1 Left Achilles: Change Dressing Monday, Wednesday, Friday Wound #2 Left,Lateral Calcaneous: Change Dressing Monday, Wednesday, Friday Wound #3 Left Toe Great: Change Dressing Monday, Wednesday, Friday Follow-up Appointments: Wound #1 Left Achilles: Return Appointment in 1 week.  Wound #2 Left,Lateral Calcaneous: Return Appointment in 1 week. Wound #3 Left Toe Great: Return Appointment in 1 week. Home Health: Wound #1 Left Achilles: Continue Home Health Visits - [facility] - To change Wednesday, Friday. Continue Home Health  Visits - [facility] - To change Wednesday, Friday. Home Health Nurse may visit PRN to address patient s wound care needs. Home Health Nurse may visit PRN to address patient s wound care needs. FACE TO FACE ENCOUNTER: MEDICARE and MEDICAID PATIENTS: I certify that this patient is under my care and that I had a face-to-face encounter that meets the physician face-to-face encounter requirements with this patient on this date. The encounter with the patient was in whole or in part for the following MEDICAL CONDITION: (primary reason for Home Healthcare) MEDICAL NECESSITY: I certify, that based on my findings, NURSING services are a medically necessary home health service. HOME BOUND STATUS: I certify that my clinical findings support that this patient is homebound (i.e., Due to illness or injury, pt requires aid of supportive devices such as crutches, cane, wheelchairs, walkers, the use of special transportation or the assistance of another person to leave their place of residence. There is a normal inability to leave the home and doing so requires considerable and taxing effort. Other absences are for medical reasons / religious services and are infrequent or of short duration when for other reasons). FACE TO FACE ENCOUNTER: MEDICARE and MEDICAID PATIENTS: I certify that this patient is under my care and that I had a face-to-face encounter that meets the physician face-to-face encounter requirements with this patient on this date. The encounter with the patient was in whole or in part for the [name], [name] ([id number]) following MEDICAL CONDITION: (primary reason for Home Healthcare) MEDICAL NECESSITY: I certify, that based on my findings, NURSING services are a medically necessary home health service. HOME BOUND STATUS: I certify that my clinical findings support that this patient is homebound (i.e., Due to illness or injury, pt requires aid of supportive devices such as crutches, cane, wheelchairs,  walkers, the use of special transportation or the assistance of another person to leave their place of residence. There is a normal inability to leave the home and doing so requires considerable and taxing effort. Other absences are for medical reasons / religious services and are infrequent or of short duration when for other reasons). If current dressing causes regression in wound condition, may D/C ordered dressing product/s and apply Normal Saline Moist Dressing daily until next Wound Healing Center / Other MD appointment. Notify Wound Healing Center of regression in wound condition at [phone]. If current dressing causes regression in wound condition, may D/C ordered dressing product/s and apply Normal Saline Moist Dressing daily until next Wound Healing Center / Other MD appointment. Notify Wound Healing Center of regression in wound condition at [phone]. Please direct any NON-WOUND related issues/requests for orders to patient's Primary Care Physician Please direct any NON-WOUND related issues/requests for orders to patient's Primary Care Physician Wound #2 Left,Lateral Calcaneous: Continue Home Health Visits - [facility] - To change Wednesday, Friday. Continue Home Health Visits - [facility] - To change Wednesday, Friday. Home Health Nurse may visit PRN to address patient s wound care needs. Home Health Nurse may visit PRN to address patient s wound care needs. FACE TO FACE ENCOUNTER: MEDICARE and MEDICAID PATIENTS: I certify that this patient is under my care and that I had a face-to-face encounter that meets the physician face-to-face encounter requirements with this patient on this date.  The encounter with the patient was in whole or in part for the following MEDICAL CONDITION: (primary reason for Home Healthcare) MEDICAL NECESSITY: I certify, that based on my findings, NURSING services are a medically necessary home health service. HOME BOUND STATUS: I certify that my clinical  findings support that this patient is homebound (i.e., Due to illness or injury, pt requires aid of supportive devices such as crutches, cane, wheelchairs, walkers, the use of special transportation or the assistance of another person to leave their place of residence. There is a normal inability to leave the home and doing so requires considerable and taxing effort. Other absences are for medical reasons / religious services and are infrequent or of short duration when for other reasons). FACE TO FACE ENCOUNTER: MEDICARE and MEDICAID PATIENTS: I certify that this patient is under my care and that I had a face-to-face encounter that meets the physician face-to-face encounter requirements with this patient on this date. The encounter with the patient was in whole or in part for the following MEDICAL CONDITION: (primary reason for Home Healthcare) MEDICAL NECESSITY: I certify, that based on my findings, NURSING services are a medically necessary home health service. HOME BOUND STATUS: I certify that my clinical findings support that this patient is homebound (i.e., Due to illness or injury, pt requires aid of supportive devices such as crutches, cane, wheelchairs, walkers, the use of special transportation or the assistance of another person to leave their place of residence. There is a normal inability to leave the home and doing so requires considerable and taxing effort. Other absences are for medical reasons / religious services and are infrequent or of short duration when for other reasons). If current dressing causes regression in wound condition, may D/C ordered dressing product/s and apply Normal Saline Moist Dressing daily until next Wound Healing Center / Other MD appointment. Notify Wound Healing Center of regression in wound condition at [phone]. If current dressing causes regression in wound condition, may D/C ordered dressing product/s and apply Normal Saline Moist Dressing daily  until next Wound Healing Center / Other MD appointment. Notify Wound Healing Center of regression in wound condition at [phone]. Please direct any NON-WOUND related issues/requests for orders to patient's Primary Care Physician Please direct any NON-WOUND related issues/requests for orders to patient's Primary Care Physician Wound #3 Left Toe Great: Continue Home Health Visits - [facility] - To change Wednesday, Friday. Continue Home Health Visits - [facility] - To change Wednesday, Friday. JILBERTO, VANDERWALL ([id number]) Home Health Nurse may visit [facility] to address patient s wound care needs. Home Health Nurse may visit [facility] to address patient s wound care needs. FACE TO FACE ENCOUNTER: MEDICARE and MEDICAID PATIENTS: I certify that this patient is under my care and that I had a face-to-face encounter that meets the physician face-to-face encounter requirements with this patient on this date. The encounter with the patient was in whole or in part for the following MEDICAL CONDITION: (primary reason for Home Healthcare) MEDICAL NECESSITY: I certify, that based on my findings, NURSING services are a medically necessary home health service. HOME BOUND STATUS: I certify that my clinical findings support that this patient is homebound (i.e., Due to illness or injury, pt requires aid of supportive devices such as crutches, cane, wheelchairs, walkers, the use of special transportation or the assistance of another person to leave their place of residence. There is a normal inability to leave the home and doing so requires considerable and taxing effort. Other absences are  for medical reasons / religious services and are infrequent or of short duration when for other reasons). FACE TO FACE ENCOUNTER: MEDICARE and MEDICAID PATIENTS: I certify that this patient is under my care and that I had a face-to-face encounter that meets the physician face-to-face encounter requirements with this patient on this date.  The encounter with the patient was in whole or in part for the following MEDICAL CONDITION: (primary reason for Home Healthcare) MEDICAL NECESSITY: I certify, that based on my findings, NURSING services are a medically necessary home health service. HOME BOUND STATUS: I certify that my clinical findings support that this patient is homebound (i.e., Due to illness or injury, pt requires aid of supportive devices such as crutches, cane, wheelchairs, walkers, the use of special transportation or the assistance of another person to leave their place of residence. There is a normal inability to leave the home and doing so requires considerable and taxing effort. Other absences are for medical reasons / religious services and are infrequent or of short duration when for other reasons). If current dressing causes regression in wound condition, may D/C ordered dressing product/s and apply Normal Saline Moist Dressing daily until next Wound Healing Center / Other MD appointment. Notify Wound Healing Center of regression in wound condition at [phone]. If current dressing causes regression in wound condition, may D/C ordered dressing product/s and apply Normal Saline Moist Dressing daily until next Wound Healing Center / Other MD appointment. Notify Wound Healing Center of regression in wound condition at [phone]. Please direct any NON-WOUND related issues/requests for orders to patient's Primary Care Physician Please direct any NON-WOUND related issues/requests for orders to patient's Primary Care Physician Negative Pressure Wound Therapy: Wound #1 Left Achilles: Wound VAC settings at 125/130 mmHg continuous pressure. Use BLACK/GREEN foam to wound cavity. Use WHITE foam to fill any tunnel/s and/or undermining. Change VAC dressing 3 X WEEK. Change canister as indicated when full. Nurse may titrate settings and frequency of dressing changes as clinically indicated. Home Health Nurse may d/c VAC  for s/s of increased infection, significant wound regression, or uncontrolled drainage. Notify Wound Healing Center at [phone]. Apply contact layer over base of wound. Number of foam/gauze pieces used in the dressing = Other: - Apply Aquacel AG to lower half of wound bed and contact layer under foam. We are working with application of silver alginate under the black foam and trying to protect his skin as much as possible so as to prevent the maceration. ANTOINO, WESTHOFF ([id number]) He is willing to see plastic surgery at [facility] and we will make the appropriate references. Till then he will come back and see me on a weekly basis. Electronic Signature(s) Signed: 09/13/2014 4:03:34 PM By: Evlyn Kanner MD, FACS Previous Signature: 09/12/2014 1:51:46 PM Version By: Evlyn Kanner MD, FACS Entered By: Evlyn Kanner on 09/13/2014 16:03:34 Burgin, [name] ([id number]) -------------------------------------------------------------------------------- SuperBill Details Patient Name: Milewski, Ever L. Date of Service: 09/12/2014 Medical Record Number: [id number] Patient Account Number: [id number] Date of Birth/Sex: Sep 16, 1920 (81 y.o. 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 Chronic multifocal osteomyelitis, left ankle and foot  Facility Procedures CPT4: Description Modifier Quantity Code [id number] [phone] - DEB SUBQ TISSUE 20 SQ CM/< 1 ICD-10 Description Diagnosis E11.621 Type 2 diabetes mellitus with  foot ulcer E11.52 Type 2 diabetes mellitus with diabetic peripheral angiopathy with gangrene I70.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 Physician Procedures CPT4: Description Modifier Quantity Code [id number] - WC PHYS SUBQ TISS 20 SQ CM 1 ICD-10 Description Diagnosis E11.621 Type 2 diabetes mellitus with foot ulcer E11.52 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

## 2014-09-15 ENCOUNTER — Telehealth: Payer: Self-pay | Admitting: Cardiology

## 2014-09-15 ENCOUNTER — Ambulatory Visit (INDEPENDENT_AMBULATORY_CARE_PROVIDER_SITE_OTHER): Payer: Medicare Other | Admitting: *Deleted

## 2014-09-15 DIAGNOSIS — I495 Sick sinus syndrome: Secondary | ICD-10-CM | POA: Diagnosis not present

## 2014-09-15 NOTE — Progress Notes (Signed)
Remote pacemaker transmission.   

## 2014-09-15 NOTE — Telephone Encounter (Signed)
Spoke with pt and reminded pt of remote transmission that is due today. Pt verbalized understanding.   

## 2014-09-19 ENCOUNTER — Encounter: Payer: Self-pay | Admitting: General Surgery

## 2014-09-19 ENCOUNTER — Encounter (HOSPITAL_BASED_OUTPATIENT_CLINIC_OR_DEPARTMENT_OTHER): Payer: Medicare Other | Admitting: General Surgery

## 2014-09-19 DIAGNOSIS — L97323 Non-pressure chronic ulcer of left ankle with necrosis of muscle: Secondary | ICD-10-CM | POA: Diagnosis not present

## 2014-09-19 DIAGNOSIS — L97409 Non-pressure chronic ulcer of unspecified heel and midfoot with unspecified severity: Secondary | ICD-10-CM

## 2014-09-19 DIAGNOSIS — E10621 Type 1 diabetes mellitus with foot ulcer: Secondary | ICD-10-CM

## 2014-09-19 LAB — CUP PACEART REMOTE DEVICE CHECK
Battery Impedance: 631 Ohm
Battery Remaining Longevity: 59 mo
Brady Statistic AP VP Percent: 96 %
Brady Statistic AS VS Percent: 0 %
Date Time Interrogation Session: 20160825162041
Lead Channel Impedance Value: 404 Ohm
Lead Channel Impedance Value: 476 Ohm
Lead Channel Setting Pacing Amplitude: 2 V
Lead Channel Setting Pacing Amplitude: 2.5 V
Lead Channel Setting Pacing Pulse Width: 0.4 ms
Lead Channel Setting Sensing Sensitivity: 5.6 mV
MDC IDC MSMT BATTERY VOLTAGE: 2.78 V
MDC IDC MSMT LEADCHNL RA PACING THRESHOLD AMPLITUDE: 0.625 V
MDC IDC MSMT LEADCHNL RA PACING THRESHOLD PULSEWIDTH: 0.4 ms
MDC IDC MSMT LEADCHNL RV PACING THRESHOLD AMPLITUDE: 0.5 V
MDC IDC MSMT LEADCHNL RV PACING THRESHOLD PULSEWIDTH: 0.4 ms
MDC IDC STAT BRADY AP VS PERCENT: 0 %
MDC IDC STAT BRADY AS VP PERCENT: 3 %

## 2014-09-19 NOTE — Progress Notes (Signed)
See i heal 

## 2014-09-20 NOTE — Progress Notes (Addendum)
SEENA, FACE ([id number]) Visit Report for 09/19/2014 Arrival Information Details Patient Name: [name], [name]. Date of Service: 09/19/2014 11:45 AM Medical Record Number: [id number] Patient Account Number: [id number] Date of Birth/Sex: [birth year] (81 y.o. Male) Treating RN: Huel Coventry Primary Care Physician: Lindwood Qua Other Clinician: Referring Physician: Lindwood Qua Treating Physician/Extender: Rudene Re in Treatment: 20 Visit Information History Since Last Visit Added or deleted any medications: No Patient Arrived: Wheel Chair Any new allergies or adverse reactions: No Arrival Time: 11:42 Had a fall or experienced change in No Accompanied By: son activities of daily living that may affect Transfer Assistance: Manual risk of falls: Patient Identification Verified: Yes Signs or symptoms of abuse/neglect since last No Secondary Verification Process Yes visito Completed: Hospitalized since last visit: No Patient Requires Transmission-Based No Has Dressing in Place as Prescribed: Yes Precautions: Pain Present Now: No Patient Has Alerts: Yes Patient Alerts: 04/27/13 ABI (L) 0.91 (R) 0.71 Electronic Signature(s) Signed: 09/19/2014 4:49:46 PM By: Elliot Gurney, RN, BSN, Kim RN, BSN Entered By: Elliot Gurney, RN, BSN, Kim on 09/19/2014 11:42:49 Delahoussaye, [name] ([id number]) -------------------------------------------------------------------------------- Complex / Palliative Patient Assessment Details Patient Name: [name], [name] Date of Service: 09/19/2014 11:45 AM Medical Record Number: [id number] Patient Account Number: [id number] Date of Birth/Sex: 05-30-1920 (81 y.o. Male) Treating RN: Huel Coventry Primary Care Physician: Lindwood Qua Other Clinician: Referring Physician: Lindwood Qua Treating Physician/Extender: Elayne Snare in Treatment: 20 Palliative Management Criteria Complex Wound Management Criteria Patient is unable to adhere to an advanced wound  management plan. o Patient Care Contract has been implemented. Patient demonstrates continued inability to adhere to an advanced wound management plan, the patient has been presented with the option of continuing the advanced plan or implementing the Complex Wound Management Plan. o Patient or healthcare surrogate must agree to Complex Wound Management Plan. Care Approach Wound Care Plan: Complex Wound Management Notes Patient could not tolerate HBO treatments. Patient too weak. Electronic Signature(s) Signed: 09/20/2014 12:42:01 PM By: Elliot Gurney, RN, BSN, Kim RN, BSN Entered By: Elliot Gurney, RN, BSN, Kim on 09/20/2014 10:42:38 Mcglown, [name] ([id number]) -------------------------------------------------------------------------------- Encounter Discharge Information Details Patient Name: [name], [name] Date of Service: 09/19/2014 11:45 AM Medical Record Number: [id number] Patient Account Number: [id number] Date of Birth/Sex: Aug 11, 1920 (81 y.o. Male) Treating RN: Huel Coventry Primary Care Physician: Lindwood Qua Other Clinician: Referring Physician: Lindwood Qua Treating Physician/Extender: Elayne Snare in Treatment: 20 Encounter Discharge Information Items Discharge Pain Level: 0 Discharge Condition: Stable Ambulatory Status: Wheelchair Discharge Destination: Home Transportation: Private Auto Accompanied By: son Schedule Follow-up Appointment: Yes Medication Reconciliation completed and provided to Patient/Care Yes [name]: Provided on Clinical Summary of Care: 09/19/2014 Form Type Recipient Paper Patient AE Electronic Signature(s) Signed: 09/20/2014 1:38:37 PM By: Ardath Sax MD Previous Signature: 09/20/2014 12:40:15 PM Version By: Ardath Sax MD Previous Signature: 09/19/2014 4:49:46 PM Version By: Elliot Gurney RN, BSN, Kim RN, BSN Previous Signature: 09/19/2014 12:19:35 PM Version By: Gwenlyn Perking Entered By: Ardath Sax on 09/20/2014 13:38:37 Cates, [name]  ([id number]) -------------------------------------------------------------------------------- Lower Extremity Assessment Details Patient Name: Goebel, Ayron L. Date of Service: 09/19/2014 11:45 AM Medical Record Number: [id number] Patient Account Number: [id number] Date of Birth/Sex: 1920-06-15 (81 y.o. Male) Treating RN: Huel Coventry Primary Care Physician: Lindwood Qua Other Clinician: Referring Physician: Lindwood Qua Treating Physician/Extender: Elayne Snare in Treatment: 20 Vascular Assessment Pulses: Posterior Tibial Dorsalis Pedis Palpable: [Left:No] Doppler: [Left:Monophasic] Extremity colors, hair growth, and conditions: Extremity Color: [Left:Pale] Hair Growth on Extremity: [Left:No] Temperature of Extremity: [Left:Warm] Capillary Refill: [Left:>  3 seconds] Toe Nail Assessment Left: Right: Thick: Yes Discolored: Yes Deformed: Yes Improper Length and Hygiene: Yes Electronic Signature(s) Signed: 09/19/2014 4:49:46 PM By: Elliot Gurney, RN, BSN, Kim RN, BSN Entered By: Elliot Gurney, RN, BSN, Kim on 09/19/2014 11:51:08 Jellison, [name] ([id number]) -------------------------------------------------------------------------------- Multi Wound Chart Details Patient Name: Capp, Azariah L. Date of Service: 09/19/2014 11:45 AM Medical Record Number: [id number] Patient Account Number: [id number] Date of Birth/Sex: 07/27/1920 (81 y.o. 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] Comorbid History: [1:Cataracts, Chronic sinus Cataracts, Chronic sinus Cataracts, Chronic sinus problems/congestion, Congestive Heart Failure, Congestive Heart Failure, Congestive Heart Failure, Coronary Artery Disease, Coronary Artery Disease, Coronary  Artery Disease, Hypertension, Myocardial Hypertension, Myocardial Hypertension, Myocardial Infarction, Peripheral Arterial Disease, Type II Arterial Disease, Type II Arterial Disease, Type II Diabetes, Neuropathy] [2:problems/congestion, Infarction,  Peripheral Diabetes, Neuropathy] [3:problems/congestion, Infarction, Peripheral Diabetes, Neuropathy] Date Acquired: [1:01/24/2014] [2:01/24/2014] [3:01/24/2014] Weeks of Treatment: [1:20] [2:20] [3:20] Wound Status: [1:Open] [2:Open] [3:Open] Clustered Wound: [1:No] [2:Yes] [3:No] Pending Amputation on Yes [2:No] [3:No] Presentation: Measurements L x W x D 8.3x8x0.3 [2:0.8x0.6x0.3] [3:0.5x0.2x0.1] (cm) Area (cm) : [1:52.15] [2:0.377] [3:0.079] Volume (cm) : [1:15.645] [2:0.113] [3:0.008] % Reduction in Area: [1:-110.90%] [2:93.30%] [3:90.90%] % Reduction in Volume: -110.80% [2:80.00%] [3:90.70%] Classification: [1:Full Thickness With Exposed Support Structures] [2:Full Thickness Without Exposed Support Structures] [3:Full Thickness Without Exposed Support Structures] HBO Classification: [1:Grade 2] [2:Grade 2] [3:Grade 2] Exudate Amount: [1:Large] [2:N/A] [3:None Present] Exudate Type: [1:Serosanguineous] [2:N/A] [3:N/A] Exudate Color: red, brown N/A N/A Wound Margin: Distinct, outline attached Flat and Intact Flat and Intact Granulation Amount: Large (67-100%) Small (1-33%) None Present (0%) Granulation Quality: Pink N/A N/A Necrotic Amount: Small (1-33%) None Present (0%) Large (67-100%) Necrotic Tissue: Adherent Slough N/A Eschar Exposed Structures: Fascia:  No Fascia: No Fascia: No Fat: No Fat: No Fat: No Tendon: No Tendon: No Tendon: No Muscle: No Muscle: No Muscle: No Joint: No Joint: No Joint: No Bone: No Bone: No Bone: No Limited to Skin Limited to Skin Limited to Skin Breakdown Breakdown Breakdown Epithelialization: Medium (34-66%) Small (1-33%) N/A Periwound Skin Texture: Edema: No Edema: No Edema: No Excoriation: No Excoriation: No Excoriation: No Induration: No Induration: No Induration: No Callus: No Callus: No Callus: No Crepitus: No Crepitus: No Crepitus: No Fluctuance: No Fluctuance: No Fluctuance: No Friable: No Friable: No Friable: No Rash: No Rash: No Rash: No Scarring: No Scarring: No Scarring: No Periwound Skin Maceration: Yes Maceration: Yes Dry/Scaly: Yes Moisture: Moist: Yes Moist: Yes Maceration: No Dry/Scaly: No Dry/Scaly: No Moist: No Periwound Skin Color: Atrophie Blanche: No Atrophie Blanche: No Atrophie Blanche: No Cyanosis: No Cyanosis: No Cyanosis: No Ecchymosis: No Ecchymosis: No Ecchymosis: No Erythema: No Erythema: No Erythema: No Hemosiderin Staining: No Hemosiderin Staining: No Hemosiderin Staining: No Mottled: No Mottled: No Mottled: No Pallor: No Pallor: No Pallor: No Rubor: No Rubor: No Rubor: No Tenderness on No No No Palpation: Wound Preparation: Ulcer Cleansing: Ulcer Cleansing: Ulcer Cleansing: Rinsed/Irrigated with Rinsed/Irrigated with Rinsed/Irrigated with Saline Saline Saline Topical Anesthetic Topical Anesthetic Topical Anesthetic Applied: Other: lidocaine Applied: Other: lidocaine Applied: Other: 4% 4%  LIDOCAINE 4% Treatment Notes Electronic Signature(s) Signed: 09/19/2014 4:49:46 PM By: Elliot Gurney, RN, BSN, Kim RN, BSN [address], [name] ([id number]) Entered By: Elliot Gurney RN, BSN, Kim on 09/19/2014 12:19:58 NATHANAEL, KRIST ([id number]) -------------------------------------------------------------------------------- Multi-Disciplinary Care  Plan Details Patient Name: Julia, Lonzo L. Date of Service: 09/19/2014 11:45 AM Medical Record Number: [id number] Patient Account Number: [id number] Date of Birth/Sex: August 08, 1920 (81 y.o. Male) Treating RN: Huel Coventry Primary Care Physician: Lindwood Qua Other Clinician: Referring Physician: Lindwood Qua Treating Physician/Extender: Elayne Snare in Treatment: 20 Active Inactive Abuse / Safety / Falls / Self Care Management Nursing Diagnoses: Potential for falls Goals: Patient will remain injury free Date Initiated: 04/28/2014 Goal Status: Active Interventions: Assess fall risk on admission and as needed Notes: Necrotic Tissue Nursing Diagnoses: Impaired tissue integrity related to necrotic/devitalized tissue Goals: Necrotic/devitalized tissue will be minimized in the wound bed Date Initiated: 04/28/2014 Goal Status: Active Interventions: Provide education on necrotic tissue and debridement process Treatment Activities: Apply topical anesthetic as ordered : 09/19/2014 Notes: Nutrition Nursing Diagnoses: Potential for alteratiion in Nutrition/Potential for imbalanced nutrition Buchta, Zealand L. ([id number]) Goals: Patient/caregiver agrees to and verbalizes understanding of need to use nutritional supplements and/or vitamins as prescribed Date Initiated: 04/28/2014 Goal Status: Active Interventions: Assess patient nutrition upon admission and as needed per policy Notes: Orientation to the Wound Care Program Nursing Diagnoses: Knowledge deficit related to the wound healing center program Goals: Patient/caregiver will verbalize understanding of the Wound Healing Center Program Date Initiated: 04/28/2014 Goal Status: Active Interventions: Provide education on orientation to the wound center Notes: Wound/Skin Impairment Nursing Diagnoses: Impaired tissue integrity Goals: Ulcer/skin breakdown will have a volume reduction of 30% by week 4 Date Initiated:  04/28/2014 Goal Status: Active Interventions: Assess ulceration(s) every visit Notes: Electronic Signature(s) Signed: 09/19/2014 4:49:46 PM By: Elliot Gurney, RN, BSN, Kim RN, BSN Entered By: Elliot Gurney, RN, BSN, Kim on 09/19/2014 12:19:50 Pech, [name] ([id number]) -------------------------------------------------------------------------------- Pain Assessment Details Patient Name: [name], [name] Date of Service: 09/19/2014 11:45 AM Medical Record Number: [id number] Patient Account Number: [id number] Date of Birth/Sex: 1920/03/10 (81 y.o. Male) Treating RN: Huel Coventry Primary Care Physician: Lindwood Qua Other Clinician: Referring Physician: Lindwood Qua Treating Physician/Extender: Rudene Re in Treatment: 20 Active Problems Location of Pain Severity and Description of Pain Patient Has Paino No Site Locations Pain Management and Medication Current Pain Management: Electronic Signature(s) Signed: 09/19/2014 4:49:46 PM By: Elliot Gurney, RN, BSN, Kim RN, BSN Entered By: Elliot Gurney, RN, BSN, Kim on 09/19/2014 11:42:56 Firman, [name] ([id number]) -------------------------------------------------------------------------------- Patient/Caregiver Education Details Patient Name: Delk, [name] Date of Service: 09/19/2014 11:45 AM Medical Record Number: [id number] Patient Account Number: [id number] Date of Birth/Gender: [birth year] (81 y.o. Male) Treating RN: Huel Coventry Primary Care Physician: Lindwood Qua Other Clinician: Referring Physician: Lindwood Qua Treating Physician/Extender: Elayne Snare in Treatment: 20 Education Assessment Education Provided To: Patient Education Topics Provided Wound/Skin Impairment: Handouts: Caring for Your Ulcer, Other: continue wound care as prescribed Electronic Signature(s) Signed: 09/20/2014 1:38:49 PM By: Ardath Sax MD Previous Signature: 09/20/2014 12:40:26 PM Version By: Ardath Sax MD Previous Signature: 09/19/2014 4:49:46 PM  Version By: Elliot Gurney RN, BSN, Kim RN, BSN Entered By: Ardath Sax on 09/20/2014 13:38:49 Cutillo, [name] ([id number]) -------------------------------------------------------------------------------- Wound Assessment Details Patient Name: Stumph, Cartier L. Date of Service: 09/19/2014 11:45 AM Medical Record Number: [id number] Patient Account Number: [id number] Date of Birth/Sex: 1920-09-25 (81 y.o. Male) Treating RN: Huel Coventry Primary Care Physician: Lindwood Qua Other Clinician: Referring Physician: Lindwood Qua Treating Physician/Extender: Ardath Sax Weeks in Treatment:  20 Wound Status Wound Number: 1 Primary Arterial Insufficiency Ulcer Etiology: Wound Location: Left Achilles Wound Open Wounding Event: Gradually Appeared Status: Date Acquired: 01/24/2014 Comorbid Cataracts, Chronic sinus Weeks Of Treatment: 20 History: problems/congestion, Congestive Heart Clustered Wound: No Failure, Coronary Artery Disease, Pending Amputation On Presentation Hypertension, Myocardial Infarction, Peripheral Arterial Disease, Type II Diabetes, Neuropathy Photos Photo Uploaded By: Elliot Gurney, RN, BSN, Kim on 09/19/2014 16:59:27 Wound Measurements Length: (cm) 8.3 Width: (cm) 8 Depth: (cm) 0.3 Area: (cm) 52.15 Volume: (cm) 15.645 % Reduction in Area: -110.9% % Reduction in Volume: -110.8% Epithelialization: Medium (34-66%) Wound Description Classification: NICHAEL, EHLY. ([id number]) Foul Odor After Cleansing: No Full Thickness With Exposed Support Structures Diabetic Severity Grade 2 (Wagner): Wound Margin: Distinct, outline attached Exudate Amount: Large Exudate Type: Serosanguineous Exudate Color: red, brown Wound Bed Granulation Amount: Large (67-100%) Exposed Structure Granulation Quality: Pink Fascia Exposed: No Necrotic Amount: Small (1-33%) Fat Layer Exposed: No Necrotic Quality: Adherent Slough Tendon Exposed: No Muscle Exposed: No Joint Exposed: No Bone  Exposed: No Limited to Skin Breakdown Periwound Skin Texture Texture Color No Abnormalities Noted: No No Abnormalities Noted: No Callus: No Atrophie Blanche: No Crepitus: No Cyanosis: No Excoriation: No Ecchymosis: No Fluctuance: No Erythema: No Friable: No Hemosiderin Staining: No Induration: No Mottled: No Localized Edema: No Pallor: No Rash: No Rubor: No Scarring: No Moisture No Abnormalities Noted: No Dry / Scaly: No Maceration: Yes Moist: Yes Wound Preparation Ulcer Cleansing: Rinsed/Irrigated with Saline Topical Anesthetic Applied: Other: lidocaine 4%, Treatment Notes Wound #1 (Left Achilles) 1. Cleansed with: Clean wound with Normal Saline 4. Dressing Applied: [name], [name] ([id number]) Mepitel 8. Negative Pressure Wound Therapy Wound Vac to wound continuously at 124mm/hg pressure Black Foam Apply contact layer over base of wound. Number of foam/gauze pieces used in the dressing = Change canister as needed. Electronic Signature(s) Signed: 09/19/2014 4:49:46 PM By: Elliot Gurney, RN, BSN, Kim RN, BSN Entered By: Elliot Gurney, RN, BSN, Kim on 09/19/2014 11:53:23 Turnley, [name] ([id number]) -------------------------------------------------------------------------------- Wound Assessment Details Patient Name: [name], [name] Date of Service: 09/19/2014 11:45 AM Medical Record Number: [id number] Patient Account Number: [id number] Date of Birth/Sex: May 02, 1920 (81 y.o. Male) Treating RN: Huel Coventry Primary Care Physician: Lindwood Qua Other Clinician: Referring Physician: Lindwood Qua Treating Physician/Extender: Ardath Sax Weeks in Treatment: 20 Wound Status Wound Number: 2 Primary Arterial Insufficiency Ulcer Etiology: Wound Location: Left Calcaneous - Lateral Wound Open Wounding Event: Gradually Appeared Status: Date Acquired: 01/24/2014 Comorbid Cataracts, Chronic sinus Weeks Of Treatment: 20 History: problems/congestion, Congestive Heart Clustered  Wound: Yes Failure, Coronary Artery Disease, Hypertension, Myocardial Infarction, Peripheral Arterial Disease, Type II Diabetes, Neuropathy Photos Photo Uploaded By: Elliot Gurney, RN, BSN, Kim on 09/19/2014 16:59:27 Wound Measurements Length: (cm) 0.8 Width: (cm) 0.6 Depth: (cm) 0.3 Area: (cm) 0.377 Volume: (cm) 0.113 % Reduction in Area: 93.3% % Reduction in Volume: 80% Epithelialization: Small (1-33%) Wound Description Full Thickness Without Exposed Classification: Support Structures Diabetic Severity Grade 2 (Wagner): Wound Margin: Flat and Intact Wound Bed Granulation Amount: Small (1-33%) Exposed Structure [name], [name] ([id number]) Necrotic Amount: None Present (0%) Fascia Exposed: No Fat Layer Exposed: No Tendon Exposed: No Muscle Exposed: No Joint Exposed: No Bone Exposed: No Limited to Skin Breakdown Periwound Skin Texture Texture Color No Abnormalities Noted: No No Abnormalities Noted: No Callus: No Atrophie Blanche: No Crepitus: No Cyanosis: No Excoriation: No Ecchymosis: No Fluctuance: No Erythema: No Friable: No Hemosiderin Staining: No Induration: No Mottled: No Localized Edema: No Pallor: No Rash: No Rubor: No Scarring: No Moisture No  Abnormalities Noted: No Dry / Scaly: No Maceration: Yes Moist: Yes Wound Preparation Ulcer Cleansing: Rinsed/Irrigated with Saline Topical Anesthetic Applied: Other: lidocaine 4%, Treatment Notes Wound #2 (Left, Lateral Calcaneous) 1. Cleansed with: Clean wound with Normal Saline 2. Anesthetic Topical Lidocaine 4% cream to wound bed prior to debridement 4. Dressing Applied: Prisma Ag 5. Secondary Dressing Applied Bordered Foam Dressing Electronic Signature(s) Signed: 09/19/2014 4:49:46 PM By: Elliot Gurney, RN, BSN, Kim RN, BSN Entered By: Elliot Gurney, RN, BSN, Kim on 09/19/2014 11:54:22 Burcher, [name] ([id number]) -------------------------------------------------------------------------------- Wound  Assessment Details Patient Name: [name], [name] Date of Service: 09/19/2014 11:45 AM Medical Record Number: [id number] Patient Account Number: [id number] Date of Birth/Sex: 28-Jun-1920 (81 y.o. Male) Treating RN: Huel Coventry Primary Care Physician: Lindwood Qua Other Clinician: Referring Physician: Lindwood Qua Treating Physician/Extender: Ardath Sax Weeks in Treatment: 20 Wound Status Wound Number: 3 Primary Arterial Insufficiency Ulcer Etiology: Wound Location: Left Toe Great Wound Open Wounding Event: Gradually Appeared Status: Date Acquired: 01/24/2014 Comorbid Cataracts, Chronic sinus Weeks Of Treatment: 20 History: problems/congestion, Congestive Heart Clustered Wound: No Failure, Coronary Artery Disease, Hypertension, Myocardial Infarction, Peripheral Arterial Disease, Type II Diabetes, Neuropathy Photos Photo Uploaded By: Elliot Gurney, RN, BSN, Kim on 09/19/2014 16:59:57 Wound Measurements Length: (cm) 0.5 Width: (cm) 0.2 Depth: (cm) 0.1 Area: (cm) 0.079 Volume: (cm) 0.008 % Reduction in Area: 90.9% % Reduction in Volume: 90.7% Wound Description Full Thickness Without Exposed Classification: Support Structures Diabetic Severity Grade 2 (Wagner): Wound Margin: Flat and Intact Exudate Amount: None Present Wound Bed Oser, Torrie L. ([id number]) Granulation Amount: None Present (0%) Exposed Structure Necrotic Amount: Large (67-100%) Fascia Exposed: No Necrotic Quality: Eschar Fat Layer Exposed: No Tendon Exposed: No Muscle Exposed: No Joint Exposed: No Bone Exposed: No Limited to Skin Breakdown Periwound Skin Texture Texture Color No Abnormalities Noted: No No Abnormalities Noted: No Callus: No Atrophie Blanche: No Crepitus: No Cyanosis: No Excoriation: No Ecchymosis: No Fluctuance: No Erythema: No Friable: No Hemosiderin Staining: No Induration: No Mottled: No Localized Edema: No Pallor: No Rash: No Rubor: No Scarring:  No Moisture No Abnormalities Noted: No Dry / Scaly: Yes Maceration: No Moist: No Wound Preparation Ulcer Cleansing: Rinsed/Irrigated with Saline Topical Anesthetic Applied: Other: LIDOCAINE 4%, Treatment Notes Wound #3 (Left Toe Great) 1. Cleansed with: Clean wound with Normal Saline 2. Anesthetic Topical Lidocaine 4% cream to wound bed prior to debridement 4. Dressing Applied: Prisma Ag 7. Secured with Self adhesive bandage Electronic Signature(s) Signed: 09/19/2014 4:49:46 PM By: Elliot Gurney, RN, BSN, Kim RN, BSN Entered By: Elliot Gurney, RN, BSN, Kim on 09/19/2014 11:55:24 Jakubek, [name] ([id number]) Comacho, Cordelro [name] ([id number]) -------------------------------------------------------------------------------- Vitals Details Patient Name: Krieger, Ndrew L. Date of Service: 09/19/2014 11:45 AM Medical Record Number: [id number] Patient Account Number: [id number] Date of Birth/Sex: 04-08-1920 (81 y.o. Male) Treating RN: Huel Coventry Primary Care Physician: Lindwood Qua Other Clinician: Referring Physician: Lindwood Qua Treating Physician/Extender: Rudene Re in Treatment: 20 Vital Signs Time Taken: 11:42 Pulse (bpm): 87 Height (in): 70 Respiratory Rate (breaths/min): 22 Weight (lbs): 200 Blood Pressure (mmHg): 88/41 Body Mass Index (BMI): 28.7 Reference Range: 80 - 120 mg / dl Notes Patient's BP is low today. Advised to follow-up with PCP. MD Notified. Patient states he is feeling worse today than usual. Electronic Signature(s) Signed: 09/19/2014 4:49:46 PM By: Elliot Gurney, RN, BSN, Kim RN, BSN Entered By: Elliot Gurney, RN, BSN, Kim on 09/19/2014 11:44:43

## 2014-09-20 NOTE — Progress Notes (Addendum)
[name] ([id number]) Visit Report for 09/19/2014 Chief Complaint Document Details Patient Name: [name], [name] Date of Service: 09/19/2014 11:45 AM Medical Record Number: [id number] Patient Account Number: [id number] Date of Birth/Sex: Feb 14, 1920 (81 y.o. Male) Treating RN: Huel Coventry Primary Care Physician: Lindwood Qua Other Clinician: Referring Physician: Lindwood Qua Treating Physician/Extender: Elayne Snare in Treatment: 20 Information Obtained from: Patient Chief Complaint Patient presents to the wound care center for a consult due non healing wound 81 year old gentleman who comes with a history of having some ulcerated areas on his heels since February 2016 and then a large injury to his left posterior heel and ankle since about a month. Electronic Signature(s) Signed: 09/20/2014 1:35:28 PM By: Ardath Sax MD Previous Signature: 09/20/2014 12:36:06 PM Version By: Ardath Sax MD Entered By: Ardath Sax on 09/20/2014 13:35:27 Osland, [name] ([id number]) -------------------------------------------------------------------------------- HPI Details Patient Name: [name], [name] Date of Service: 09/19/2014 11:45 AM Medical Record Number: [id number] Patient Account Number: [id number] Date of Birth/Sex: 1920-12-04 (81 y.o. Male) Treating RN: Huel Coventry Primary Care Physician: Lindwood Qua Other Clinician: Referring Physician: Lindwood Qua Treating Physician/Extender: Elayne Snare in Treatment: 20 History of Present Illness HPI Description: 81 year old gentleman who was known to be a diabetic for many years has peripheral neuropathy recently went to his primary care doctor for a punctured wound on his left heel which was something he had noticed. The patient then was referred to a podiatrist who referred him to Dr. Wyn Quaker in the vascular surgery department and I understand the procedure was done on 03/14/2014 with a left lower extremity vascular  procedure and stenting was done. Details of this are not available at the present time. The patient has been applying Neosporin to his leg and has not had any wound care addressed so far. patient has also had a history of coronary artery disease in the past and hasn't had a CABG and a pacemaker placement in the remote past.Other details and notes are pending. the patient is not in pain lives alone and has some home health and other aides coming to help him with his daily chores and meals. reviewing the vascular notes I understand the procedure was done on 03/14/2014 and he was operated by Dr. Wyn Quaker. he had a catheter placement to the left peroneal artery and a aortogram and selective left lower extremity angiogram was done. He also had a percutaneous transluminal angioplasty of the left peroneal artery and the tibioperoneal trunk. The distal SFA and above-knee popliteal artery were also angioplastied. A subcutaneous stent placement to the distal superficial femoral artery was also done. 05/05/2014 -- the patient has not had any change in his health and after much consideration is decided that he does not want HBOT as he is claustrophobic and was unable to tolerate being in the chamber for 90 minutes. I have discussed with him that his most recent x-ray of the foot shows that he has the distal phalanx of the left great toe showing changes with osteomyelitis cannot be excluded at that site. He tells me that he cannot have an MRI because of his defibrillator and hence we will order a triple phase bone scan. I have also reviewed his culture report which shows several organisms and he has sensitivity to tetracycline and we have recommended he takes this for 14 days and this has been given to him on 05/02/2014 05/12/2014 the bone scan done on 05/10/2014 shows #1 findings are worrisome for osteomyelitis involving the calcaneus of the left foot, #  2 increase uptake localizing to the left second and third  toe on all 3 phases. Cannot rule out osteomyelitis in this area. And #3 left foot cellulitis. 05/19/2014 -- reviewed several reports which we have received back on [name]. #1 chest x-ray done on April 21 shows chronic changes in the left base no acute findings. #2 his CBC is within normal limits. #3 hemoglobin A1c was 8.5%. #4 EKG done on 26 April was within normal limits due to his electronic ventricular pacemaker. 05/26/2014 -- he has had his PICC line placed and is taking vancomycin daily basis. He is to start hyperbaric oxygen therapy on this coming Monday. 06/09/2014 -- he saw Dr. Sampson Goon yesterday and besides his IV antibiotic I believe a oral antibiotic was KIMI, KROFT. ([id number]) also given and this may be Cipro. Patient is feeling fine otherwise does not have any symptoms though his blood pressure this morning in the wound center has been 90/50. We will check his blood pressure in the supine position. 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 Complained of worsening fatigue yesterday after HBO. Feels better today. Has appointment with PCP this afternoon. He wishes to hold off on HBO until next week. No significant pain. No fever or chills. Stable drainage. 06/23/2014 My has taken a break from HBO T and has been feeling a little better. He was seen by the nurse practitioner at his PCPs office and at that time his vitals were stable and they did get some EKG done which was within normal limits. There have sent out some lab work which we still have to receive reports. He is going to see his PCP back this afternoon and will be seeing Dr. Sampson Goon tomorrow for review. addendum: we have received labs from his PCPs office and note that his potassium is high at 5.4 and his BUN/creatinine is slightly raised. His blood sugar was 216. His total protein and albumen were 5.9 and 3 and this was low. His HandH was 10.3 and 31.4 WBC was 8.2  and his platelets were 304. CRP was 29.6 which was high. Vancomycin trough was 19.1 which was normal TSH was 3.11 which was normal 06/30/2014 -- [name] feels weak today and he feels his stomach is acting up and his not feeling up to doing hyperbaric oxygen therapy. He would like to take a break today and tomorrow and resume on Monday. He is on vancomycin and Levaquin as per ID and he has an appointment to see the vascular surgeons in about 10 days' time. 07/07/2014 -- [name] has been feeling weak all along and he has decided that he is not going to continue with hyperbaric oxygen therapy and a longer period. He had finished 16 of his 40 treatments and will not take anymore. He has an appointment with ID tomorrow and also his vascular surgeons and I will have a conference with both of them regarding his further care. 07/14/2014 -- over the last week I have contacted Dr. Sampson Goon and Dr. Wyn Quaker and discussed his care. Dr. Wyn Quaker agrees that he will take him to the OR and debrided his wound and use a wound VAC. Dr. Sampson Goon has altered his anti-biotic regimen - vancomycin has been stopped and he is on oral levofloxacin. The patient's surgery is scheduled for July 7. 07/21/2014 -- he continues to feel unwell and has a lot of GI disturbances to probably his antibiotics. He also has developed a new blister on his left fourth  toe and is concerned about his upcoming surgery which is scheduled for next Thursday. 08/04/2014 -- he was taken to the OR on 07/28/2014 and a debridement of the left heel was done of skin and soft tissue and the exposed calcaneum to remove all nonviable tissue. A wound VAC was then applied at the end of the procedure. 08/12/2014 -- he was seen by Dr. Sampson Goon today and he is going to continue one more week of Levaquin and then stop it and see him back in 3 weeks' time. I have reviewed his notes from today. 08/29/2014 -- he saw Dr. Wyn Quaker his surgeon who did not have any other  suggestions except for me to proceed with the skin substitute when ready. 09/05/2014 -- he saw Dr. Sampson Goon last week and he has done some tests and is awaiting his decision regarding putting him on further antibiotics. GERALDINE, TESAR ([id number]) 09/12/2014 -- he was willing to have a plastic surgery opinion after giving it careful thought. We will set him up for this at [facility] as per his choice. Electronic Signature(s) Signed: 09/20/2014 1:36:05 PM By: Ardath Sax MD Previous Signature: 09/20/2014 12:37:00 PM Version By: Ardath Sax MD Entered By: Ardath Sax on 09/20/2014 13:36:04 Gugel, [name] ([id number]) -------------------------------------------------------------------------------- Physical Exam Details Patient Name: [name], [name] Date of Service: 09/19/2014 11:45 AM Medical Record Number: [id number] Patient Account Number: [id number] Date of Birth/Sex: Jun 17, 1920 (81 y.o. Male) Treating RN: Huel Coventry Primary Care Physician: Lindwood Qua Other Clinician: Referring Physician: Lindwood Qua Treating Physician/Extender: Elayne Snare in Treatment: 20 Electronic Signature(s) Signed: 09/20/2014 1:36:15 PM By: Ardath Sax MD Previous Signature: 09/20/2014 12:37:07 PM Version By: Ardath Sax MD Entered By: Ardath Sax on 09/20/2014 13:36:15 Feister, [name] -------------------------------------------------------------------------------- Physician Orders Details Patient Name: Minihan, Delio L. Date of Service: 09/19/2014 11:45 AM Medical Record Number: [id number] Patient Account Number: [id number] Date of Birth/Sex: 1920-04-23 (81 y.o. Male) Treating RN: Huel Coventry Primary Care Physician: Lindwood Qua Other Clinician: Referring Physician: Lindwood Qua Treating Physician/Extender: Elayne Snare in Treatment: 20 Verbal / Phone Orders: Yes Clinician: Huel Coventry Read Back and Verified: Yes Diagnosis Coding Wound Cleansing Wound  #1 Left Achilles o Clean wound with Normal Saline. o May Shower, gently pat wound dry prior to applying new dressing. o May shower with protection. Anesthetic Wound #1 Left Achilles o Topical Lidocaine 4% cream applied to wound bed prior to debridement Wound #2 Left,Lateral Calcaneous o Topical Lidocaine 4% cream applied to wound bed prior to debridement Wound #3 Left Toe Great o Topical Lidocaine 4% cream applied to wound bed prior to debridement Skin Barriers/Peri-Wound Care Wound #1 Left Achilles o Skin Prep Wound #2 Left,Lateral Calcaneous o Skin Prep Wound #3 Left Toe Great o Skin Prep Primary Wound Dressing Wound #1 Left Achilles o Mepitel One Wound #2 Left,Lateral Calcaneous o Aquacel Ag Wound #3 Left [address] Ag Jerome, Acy L. ([id number]) Secondary Dressing Wound #2 Left,Lateral Calcaneous o Boardered Foam Dressing Dressing Change Frequency Wound #1 Left Achilles o Change Dressing Monday, Wednesday, Friday Wound #2 Left,Lateral Calcaneous o Change Dressing Monday, Wednesday, Friday Wound #3 Left Toe Great o Change Dressing Monday, Wednesday, Friday Follow-up Appointments Wound #1 Left Achilles o Return Appointment in 1 week. Wound #2 Left,Lateral Calcaneous o Return Appointment in 1 week. Wound #3 Left Toe Great o Return Appointment in 1 week. Home Health Wound #1 Left Achilles o Continue Home Health Visits - [place] - To change Wednesday, Friday. o Continue Home Health Visits -  [place] - To change Wednesday, Friday. Hydrocolloid used between Achilles and lateral foot wound as barrier for moisture. o Home Health Nurse may visit PRN to address patientos wound care needs. o Home Health Nurse may visit PRN to address patientos wound care needs. o FACE TO FACE ENCOUNTER: MEDICARE and MEDICAID PATIENTS: I certify that this patient is under my care and that I had a face-to-face encounter that meets the  physician face-to-face encounter requirements with this patient on this date. The encounter with the patient was in whole or in part for the following MEDICAL CONDITION: (primary reason for Home Healthcare) MEDICAL NECESSITY: I certify, that based on my findings, NURSING services are a medically necessary home health service. HOME BOUND STATUS: I certify that my clinical findings support that this patient is homebound (i.e., Due to illness or injury, pt requires aid of supportive devices such as crutches, cane, wheelchairs, walkers, the use of special transportation or the assistance of another person to leave their place of residence. There is a normal inability to leave the home and doing so requires considerable and taxing effort. Other absences are for medical reasons / religious services and are infrequent or of short duration when for other reasons). o FACE TO FACE ENCOUNTER: MEDICARE and MEDICAID PATIENTS: I certify that this patient is under my care and that I had a face-to-face encounter that meets the physician face-to-face encounter requirements with this patient on this date. The encounter with the patient was in whole or in part for the following MEDICAL CONDITION: (primary reason for Home Healthcare) MEDICAL NECESSITY: I certify, that based on my findings, NURSING services are a medically SIGMUND, MORERA. ([id number]) necessary home health service. HOME BOUND STATUS: I certify that my clinical findings support that this patient is homebound (i.e., Due to illness or injury, pt requires aid of supportive devices such as crutches, cane, wheelchairs, walkers, the use of special transportation or the assistance of another person to leave their place of residence. There is a normal inability to leave the home and doing so requires considerable and taxing effort. Other absences are for medical reasons / religious services and are infrequent or of short duration when for other  reasons). o If current dressing causes regression in wound condition, may D/C ordered dressing product/s and apply Normal Saline Moist Dressing daily until next Wound Healing Center / Other MD appointment. Notify Wound Healing Center of regression in wound condition at [phone]. o If current dressing causes regression in wound condition, may D/C ordered dressing product/s and apply Normal Saline Moist Dressing daily until next Wound Healing Center / Other MD appointment. Notify Wound Healing Center of regression in wound condition at [phone]. o Please direct any NON-WOUND related issues/requests for orders to patient's Primary Care Physician o Please direct any NON-WOUND related issues/requests for orders to patient's Primary Care Physician Wound #2 Left,Lateral Calcaneous o Continue Home Health Visits - [place] - To change Wednesday, Friday. o Continue Home Health Visits - [place] - To change Wednesday, Friday. o Home Health Nurse may visit PRN to address patientos wound care needs. o Home Health Nurse may visit PRN to address patientos wound care needs. o FACE TO FACE ENCOUNTER: MEDICARE and MEDICAID PATIENTS: I certify that this patient is under my care and that I had a face-to-face encounter that meets the physician face-to-face encounter requirements with this patient on this date. The encounter with the patient was in whole or in part for the following MEDICAL CONDITION: (primary reason for Home  Healthcare) MEDICAL NECESSITY: I certify, that based on my findings, NURSING services are a medically necessary home health service. HOME BOUND STATUS: I certify that my clinical findings support that this patient is homebound (i.e., Due to illness or injury, pt requires aid of supportive devices such as crutches, cane, wheelchairs, walkers, the use of special transportation or the assistance of another person to leave their place of residence. There is a normal  inability to leave the home and doing so requires considerable and taxing effort. Other absences are for medical reasons / religious services and are infrequent or of short duration when for other reasons). o FACE TO FACE ENCOUNTER: MEDICARE and MEDICAID PATIENTS: I certify that this patient is under my care and that I had a face-to-face encounter that meets the physician face-to-face encounter requirements with this patient on this date. The encounter with the patient was in whole or in part for the following MEDICAL CONDITION: (primary reason for Home Healthcare) MEDICAL NECESSITY: I certify, that based on my findings, NURSING services are a medically necessary home health service. HOME BOUND STATUS: I certify that my clinical findings support that this patient is homebound (i.e., Due to illness or injury, pt requires aid of supportive devices such as crutches, cane, wheelchairs, walkers, the use of special transportation or the assistance of another person to leave their place of residence. There is a normal inability to leave the home and doing so requires considerable and taxing effort. Other absences are for medical reasons / religious services and are infrequent or of short duration when for other reasons). o If current dressing causes regression in wound condition, may D/C ordered dressing product/s and apply Normal Saline Moist Dressing daily until next Wound Healing Center / Other MD appointment. Notify Wound Healing Center of regression in wound condition at [phone]. Wessner, Jaydence L. ([id number]) o If current dressing causes regression in wound condition, may D/C ordered dressing product/s and apply Normal Saline Moist Dressing daily until next Wound Healing Center / Other MD appointment. Notify Wound Healing Center of regression in wound condition at [phone]. o Please direct any NON-WOUND related issues/requests for orders to patient's Primary Care Physician o  Please direct any NON-WOUND related issues/requests for orders to patient's Primary Care Physician Wound #3 Left Toe Great o Continue Home Health Visits - [place] - To change Wednesday, Friday. o Continue Home Health Visits - [place] - To change Wednesday, Friday. o Home Health Nurse may visit PRN to address patientos wound care needs. o Home Health Nurse may visit PRN to address patientos wound care needs. o FACE TO FACE ENCOUNTER: MEDICARE and MEDICAID PATIENTS: I certify that this patient is under my care and that I had a face-to-face encounter that meets the physician face-to-face encounter requirements with this patient on this date. The encounter with the patient was in whole or in part for the following MEDICAL CONDITION: (primary reason for Home Healthcare) MEDICAL NECESSITY: I certify, that based on my findings, NURSING services are a medically necessary home health service. HOME BOUND STATUS: I certify that my clinical findings support that this patient is homebound (i.e., Due to illness or injury, pt requires aid of supportive devices such as crutches, cane, wheelchairs, walkers, the use of special transportation or the assistance of another person to leave their place of residence. There is a normal inability to leave the home and doing so requires considerable and taxing effort. Other absences are for medical reasons / religious services and are infrequent or of short  duration when for other reasons). o FACE TO FACE ENCOUNTER: MEDICARE and MEDICAID PATIENTS: I certify that this patient is under my care and that I had a face-to-face encounter that meets the physician face-to-face encounter requirements with this patient on this date. The encounter with the patient was in whole or in part for the following MEDICAL CONDITION: (primary reason for Home Healthcare) MEDICAL NECESSITY: I certify, that based on my findings, NURSING services are a medically necessary home  health service. HOME BOUND STATUS: I certify that my clinical findings support that this patient is homebound (i.e., Due to illness or injury, pt requires aid of supportive devices such as crutches, cane, wheelchairs, walkers, the use of special transportation or the assistance of another person to leave their place of residence. There is a normal inability to leave the home and doing so requires considerable and taxing effort. Other absences are for medical reasons / religious services and are infrequent or of short duration when for other reasons). o If current dressing causes regression in wound condition, may D/C ordered dressing product/s and apply Normal Saline Moist Dressing daily until next Wound Healing Center / Other MD appointment. Notify Wound Healing Center of regression in wound condition at [phone]. o If current dressing causes regression in wound condition, may D/C ordered dressing product/s and apply Normal Saline Moist Dressing daily until next Wound Healing Center / Other MD appointment. Notify Wound Healing Center of regression in wound condition at [phone]. o Please direct any NON-WOUND related issues/requests for orders to patient's Primary Care Physician o Please direct any NON-WOUND related issues/requests for orders to patient's Primary Care Physician Negative Pressure Wound Therapy Wound #1 Left Achilles Wiacek, Chazz L. ([id number]) o Wound VAC settings at 125/130 mmHg continuous pressure. Use BLACK/GREEN foam to wound cavity. Use WHITE foam to fill any tunnel/s and/or undermining. Change VAC dressing 3 X WEEK. Change canister as indicated when full. Nurse may titrate settings and frequency of dressing changes as clinically indicated. - hydrocolloid applied between Achilles and lateral foot wound for barrier and adhesion. o Home Health Nurse may d/c VAC for s/s of increased infection, significant wound regression, or uncontrolled drainage.  Notify Wound Healing Center at [phone]. o Apply contact layer over base of wound. o Number of foam/gauze pieces used in the dressing = - 1 Electronic Signature(s) Signed: 09/19/2014 4:49:46 PM By: Elliot Gurney, RN, BSN, Kim RN, BSN Entered By: Elliot Gurney, RN, BSN, Kim on 09/19/2014 12:26:39 JAMEIS, NEWSHAM ([id number]) -------------------------------------------------------------------------------- Prescription 09/19/2014 Patient Name: Hann, Clois L. Physician: Ardath Sax MD Date of Birth: 1920/11/07 NPI#: [id number] Sex: M DEA#: Phone #: [phone] License #: Patient Address: [facility] [address] [place], [place] [address] [address] [facility] [address] [place], [place] [address] [phone] Allergies ACE Inhibitors Reaction: chest pain Severity: Severe Physician's Orders Wound VAC settings at 125/130 mmHg continuous pressure. Use BLACK/GREEN foam to wound cavity. Use WHITE foam to fill any tunnel/s and/or undermining. Change VAC dressing 3 X WEEK. Change canister as indicated when full. Nurse may titrate settings and frequency of dressing changes as clinically indicated. - hydrocolloid applied between Achilles and lateral foot wound for barrier and adhesion. Signature(s): Date(s): Psychologist, prison and probation services) Signed: 09/20/2014 1:37:01 PM By: Ardath Sax MD Previous Signature: 09/20/2014 12:39:33 PM Version By: Ardath Sax MD Previous Signature: 09/19/2014 4:49:46 PM Version By: Elliot Gurney RN, BSN, Kim RN, BSN Entered By: Ardath Sax on 09/20/2014 13:37:01 Glaude, [name] ([id number]) Mccalip, Wiatt L. ([id number]) --------------------------------------------------------------------------------  Problem List Details Patient Name: [name], [name] Date of Service: 09/19/2014 11:45 AM Medical Record Number: [id number] Patient Account Number: [id number] Date of Birth/Sex: 1920/08/09 (81 y.o. 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 Date of Service: 09/19/2014 11:45 AM Medical Record Number: [id number] Patient Account Number: [id number] Date of Birth/Sex: June 27, 1920 (81 y.o. Male) Treating RN: Huel Coventry Primary Care Physician: Lindwood Qua Other Clinician: Referring Physician: Lindwood Qua Treating Physician/Extender: Elayne Snare in Treatment: 20 Subjective Chief Complaint Information obtained from Patient Patient presents to the wound care center for a consult due non healing wound 81 year old gentleman who comes with a history of having some ulcerated areas on his heels since February 2016 and then a large injury to his left posterior heel and ankle since about a month. History of Present Illness (HPI) 81 year old gentleman who was known to be a diabetic for many years has peripheral neuropathy recently went to his primary care doctor for a  punctured wound on his left heel which was something he had noticed. The patient then was referred to a podiatrist who referred him to Dr. Wyn Quaker in the vascular surgery department and I understand the procedure was done on 03/14/2014 with a left lower extremity vascular procedure and stenting was done. Details of this are not available at the present time. The patient has been applying Neosporin to his leg and has not had any wound care addressed so far. patient has also had a history of coronary artery disease in the past and hasn't had a CABG and a pacemaker placement in the remote past.Other details and notes are pending. the patient is not in pain lives alone and has some home health and other aides coming to help him with his daily chores and meals. reviewing the vascular notes I understand the procedure was done on 03/14/2014 and he was operated by Dr. Wyn Quaker. he had a catheter placement to the left peroneal artery and a aortogram and selective left lower extremity angiogram was done. He also had a percutaneous transluminal angioplasty of the left peroneal artery and the tibioperoneal trunk. The distal SFA and above-knee popliteal artery were also angioplastied. A subcutaneous stent placement to the distal superficial femoral artery was also done. 05/05/2014 -- the patient has not had any change in his health and after much consideration is decided that he does not want HBOT as he is claustrophobic and was unable to tolerate being in the chamber for 90 minutes. I have discussed with him that his most recent x-ray of the foot shows that he has the distal phalanx of the left great toe showing changes with osteomyelitis cannot be excluded at that site. He tells me that he cannot have an MRI because of his  defibrillator and hence we will order a triple phase bone scan. I have also reviewed his culture report which shows several organisms and he has sensitivity to tetracycline and we have  recommended he takes this for 14 days and this has been given to him on 05/02/2014 05/12/2014 the bone scan done on 05/10/2014 shows #1 findings are worrisome for osteomyelitis involving the calcaneus of the left foot, #2 increase uptake localizing to the left second and third toe on all 3 phases. Cannot rule out osteomyelitis in this area. And #3 left foot cellulitis. 05/19/2014 -- reviewed several reports which we have received back on [name]. #1 chest x-ray done on April 21 shows chronic changes in the left base no acute findings. Junious, Orville L. ([id number]) #2 his CBC is within normal limits. #3 hemoglobin A1c was 8.5%. #4 EKG done on 26 April was within normal limits due to his electronic ventricular pacemaker. 05/26/2014 -- he has had his PICC line placed and is taking vancomycin daily basis. He is to start hyperbaric oxygen therapy on this coming Monday. 06/09/2014 -- he saw Dr. Sampson Goon yesterday and besides his IV antibiotic I believe a oral antibiotic was also given and this may be Cipro. Patient is feeling fine otherwise does not have any symptoms though his blood pressure this morning in the wound center has been 90/50. We will check his blood pressure in the supine position. 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 Complained of worsening fatigue yesterday after HBO. Feels better today. Has appointment with PCP this afternoon. He wishes to hold off on HBO until next week. No significant pain. No fever or chills. Stable drainage. 06/23/2014 Taige has taken a break from HBO T and has been feeling a little better. He was seen by the nurse practitioner at his PCPs office and at that time his vitals were stable and they did get some EKG done which was within normal limits. There have sent out some lab work which we still have to receive reports. He is going to see his PCP back this afternoon and will be seeing Dr. Sampson Goon tomorrow for  review. addendum: we have received labs from his PCPs office and note that his potassium is high at 5.4 and his BUN/creatinine is slightly raised. His blood sugar was 216. His total protein and albumen were 5.9 and 3 and this was low. His HandH was 10.3 and 31.4 WBC was 8.2 and his platelets were 304. CRP was 29.6 which was high. Vancomycin trough was 19.1 which was normal TSH was 3.11 which was normal 06/30/2014 -- [name] feels weak today and he feels his stomach is acting up and his not feeling up to doing hyperbaric oxygen therapy. He would like to take a break today and tomorrow and resume on Monday. He is on vancomycin and Levaquin as per ID and he has an appointment to see the vascular surgeons in about 10 days' time. 07/07/2014 -- [name] has been feeling weak all along and he has decided that he is not going to continue with hyperbaric oxygen therapy and a longer period. He had finished 16 of his 40 treatments and will not take anymore. He has an appointment with ID tomorrow and also his vascular surgeons and I will have a conference with both of them regarding his further care. 07/14/2014 -- over the last week I have contacted Dr. Sampson Goon and Dr. Wyn Quaker and discussed his care. Dr. Wyn Quaker agrees that he will take  him to the OR and debrided his wound and use a wound VAC. Dr. Sampson Goon has altered his anti-biotic regimen - vancomycin has been stopped and he is on oral levofloxacin. The patient's surgery is scheduled for July 7. 07/21/2014 -- he continues to feel unwell and has a lot of GI disturbances to probably his antibiotics. He also has developed a new blister on his left fourth toe and is concerned about his upcoming surgery which is scheduled for next Thursday. 08/04/2014 -- he was taken to the OR on 07/28/2014 and a debridement of the left heel was done of skin and soft tissue and the exposed calcaneum to remove all nonviable tissue. A wound VAC was then applied at the end of the  procedure. EAN, GETTEL ([id number]) 08/12/2014 -- he was seen by Dr. Sampson Goon today and he is going to continue one more week of Levaquin and then stop it and see him back in 3 weeks' time. I have reviewed his notes from today. 08/29/2014 -- he saw Dr. Wyn Quaker his surgeon who did not have any other suggestions except for me to proceed with the skin substitute when ready. 09/05/2014 -- he saw Dr. Sampson Goon last week and he has done some tests and is awaiting his decision regarding putting him on further antibiotics. 09/12/2014 -- he was willing to have a plastic surgery opinion after giving it careful thought. We will set him up for this at [facility] as per his choice. Objective Constitutional Vitals Time Taken: 11:42 AM, Height: 70 in, Weight: 200 lbs, BMI: 28.7, Pulse: 87 bpm, Respiratory Rate: 22 breaths/min, Blood Pressure: 88/41 mmHg. General Notes: Patient's BP is low today. Advised to follow-up with PCP. MD Notified. Patient states he is feeling worse today than usual. Integumentary (Hair, Skin) Wound #1 status is Open. Original cause of wound was Gradually Appeared. The wound is located on the Left Achilles. The wound measures 8.3cm length x 8cm width x 0.3cm depth; 52.15cm^2 area and 15.645cm^3 volume. The wound is limited to skin breakdown. There is a large amount of serosanguineous drainage noted. The wound margin is distinct with the outline attached to the wound base. There is large (67-100%) pink granulation within the wound bed. There is a small (1-33%) amount of necrotic tissue within the wound bed including Adherent Slough. The periwound skin appearance exhibited: Maceration, Moist. The periwound skin appearance did not exhibit: Callus, Crepitus, Excoriation, Fluctuance, Friable, Induration, Localized Edema, Rash, Scarring, Dry/Scaly, Atrophie Blanche, Cyanosis, Ecchymosis, Hemosiderin Staining, Mottled, Pallor, Rubor, Erythema. Wound #2 status is Open. Original cause of  wound was Gradually Appeared. The wound is located on the Left,Lateral Calcaneous. The wound measures 0.8cm length x 0.6cm width x 0.3cm depth; 0.377cm^2 area and 0.113cm^3 volume. The wound is limited to skin breakdown. The wound margin is flat and intact. There is small (1-33%) granulation within the wound bed. There is no necrotic tissue within the wound bed. The periwound skin appearance exhibited: Maceration, Moist. The periwound skin appearance did not exhibit: Callus, Crepitus, Excoriation, Fluctuance, Friable, Induration, Localized Edema, Rash, Scarring, Dry/Scaly, Atrophie Blanche, Cyanosis, Ecchymosis, Hemosiderin Staining, Mottled, Pallor, Rubor, Erythema. Wound #3 status is Open. Original cause of wound was Gradually Appeared. The wound is located on the Left Toe Great. The wound measures 0.5cm length x 0.2cm width x 0.1cm depth; 0.079cm^2 area and 0.008cm^3 volume. The wound is limited to skin breakdown. There is a none present amount of drainage noted. The wound margin is flat and intact. There is no granulation within the wound  bed. There is a large (67-100%) amount of necrotic tissue within the wound bed including Eschar. The periwound skin appearance exhibited: Dry/Scaly. The periwound skin appearance did not exhibit: Callus, Crepitus, Excoriation, Fluctuance, Friable, Induration, Localized Edema, Rash, Scarring, Maceration, Moist, Atrophie Baltz, Edwyn L. ([id number]) Blanche, Cyanosis, Ecchymosis, Hemosiderin Staining, Mottled, Pallor, Rubor, Erythema. 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 May Shower, gently pat wound dry prior to applying new dressing. May shower with protection. Anesthetic: Wound #1 Left Achilles: Topical Lidocaine 4% cream applied to wound bed prior to debridement Wound #2 Left,Lateral Calcaneous: Topical Lidocaine 4% cream applied to wound bed prior to debridement Wound #3 Left Toe Great: Topical Lidocaine 4% cream applied to wound bed prior to debridement Skin Barriers/Peri-Wound Care: Wound #1 Left Achilles: Skin Prep Wound #2 Left,Lateral Calcaneous: Skin Prep Wound #3 Left Toe Great: Skin Prep Primary Wound Dressing: Wound #1 Left Achilles: Mepitel One Wound #2 Left,Lateral Calcaneous: Aquacel Ag Wound #3 Left Toe Great: Prisma Ag Secondary Dressing: MALCOMB, GANGEMI ([id number]) Wound #2 Left,Lateral Calcaneous: Boardered Foam Dressing Dressing Change Frequency: Wound #1 Left Achilles: Change Dressing Monday, Wednesday, Friday Wound #2 Left,Lateral Calcaneous: Change Dressing Monday, Wednesday, Friday Wound #3 Left Toe Great: Change Dressing Monday, Wednesday, Friday Follow-up Appointments: Wound #1 Left Achilles: Return Appointment in 1 week. Wound #2 Left,Lateral Calcaneous: Return Appointment in 1 week. Wound #3 Left Toe Great: Return Appointment in 1 week. Home Health: Wound #1 Left Achilles: Continue Home Health Visits - [place] - To change Wednesday, Friday. Continue Home Health Visits - [place] - To change Wednesday, Friday. Hydrocolloid used between Achilles and lateral foot wound as barrier for moisture. Home Health Nurse may visit PRN to address patient s wound care needs. Home Health Nurse may visit PRN to address patient s wound care needs. FACE TO FACE ENCOUNTER: MEDICARE and MEDICAID PATIENTS: I certify that this patient is under my care and that I had a face-to-face encounter that meets the physician face-to-face encounter requirements with  this patient on this date. The encounter with the patient was in whole or in part for the following MEDICAL CONDITION: (primary reason for Home Healthcare) MEDICAL NECESSITY: I certify, that based on my findings, NURSING services are a medically necessary home health service. HOME BOUND STATUS: I certify that my clinical findings support that this patient is homebound (i.e., Due to illness or injury, pt requires aid of supportive devices such as crutches, cane, wheelchairs, walkers, the use of special transportation or the assistance of another person to leave their place of residence. There is a normal inability to leave the home and doing so requires considerable and taxing effort. Other absences are for medical reasons / religious services and are infrequent or of short duration when for other reasons). FACE TO FACE ENCOUNTER: MEDICARE and MEDICAID PATIENTS: I certify that this patient is under my care and that I had a face-to-face encounter that meets the physician face-to-face encounter requirements with this patient on this date. The encounter with the patient was in whole or in part for the following MEDICAL CONDITION: (primary reason for Home Healthcare) MEDICAL NECESSITY: I  certify, that based on my findings, NURSING services are a medically necessary home health service. HOME BOUND STATUS: I certify that my clinical findings support that this patient is homebound (i.e., Due to illness or injury, pt requires aid of supportive devices such as crutches, cane, wheelchairs, walkers, the use of special transportation or the assistance of another person to leave their place of residence. There is a normal inability to leave the home and doing so requires considerable and taxing effort. Other absences are for medical reasons / religious services and are infrequent or of short duration when for other reasons). If current dressing causes regression in wound condition, may D/C ordered dressing  product/s and apply Normal Saline Moist Dressing daily until next Wound Healing Center / Other MD appointment. Notify Wound Healing Center of regression in wound condition at [phone]. If current dressing causes regression in wound condition, may D/C ordered dressing product/s and apply Normal Saline Moist Dressing daily until next Wound Healing Center / Other MD appointment. Notify Wound Healing Center of regression in wound condition at [phone]. Please direct any NON-WOUND related issues/requests for orders to patient's Primary Care Physician Please direct any NON-WOUND related issues/requests for orders to patient's Primary Care Physician RAIHAN, KIMMEL ([id number]) Wound #2 Left,Lateral Calcaneous: Continue Home Health Visits - [place] - To change Wednesday, Friday. Continue Home Health Visits - [place] - To change Wednesday, Friday. Home Health Nurse may visit PRN to address patient s wound care needs. Home Health Nurse may visit PRN to address patient s wound care needs. FACE TO FACE ENCOUNTER: MEDICARE and MEDICAID PATIENTS: I certify that this patient is under my care and that I had a face-to-face encounter that meets the physician face-to-face encounter requirements with this patient on this date. The encounter with the patient was in whole or in part for the following MEDICAL CONDITION: (primary reason for Home Healthcare) MEDICAL NECESSITY: I certify, that based on my findings, NURSING services are a medically necessary home health service. HOME BOUND STATUS: I certify that my clinical findings support that this patient is homebound (i.e., Due to illness or injury, pt requires aid of supportive devices such as crutches, cane, wheelchairs, walkers, the use of special transportation or the assistance of another person to leave their place of residence. There is a normal inability to leave the home and doing so requires considerable and taxing effort. Other absences are for  medical reasons / religious services and are infrequent or of short duration when for other reasons). FACE TO FACE ENCOUNTER: MEDICARE and MEDICAID PATIENTS: I certify that this patient is under my care and that I had a face-to-face encounter that meets the physician face-to-face encounter requirements with this patient on this date. The encounter with the patient was in whole or in part for the following MEDICAL CONDITION: (primary reason for Home Healthcare) MEDICAL NECESSITY: I certify, that based on my findings, NURSING services are a medically necessary home health service. HOME BOUND STATUS: I certify that my clinical findings support that this patient is homebound (i.e., Due to illness or injury, pt requires aid of supportive devices such as crutches, cane, wheelchairs, walkers, the use of special transportation or the assistance of another person to leave their place of residence. There is a normal inability to leave the home and doing so requires considerable and taxing effort. Other absences are for medical reasons / religious services and are infrequent or of short duration when for other reasons). If current dressing causes regression in wound condition,  may D/C ordered dressing product/s and apply Normal Saline Moist Dressing daily until next Wound Healing Center / Other MD appointment. Notify Wound Healing Center of regression in wound condition at [phone]. If current dressing causes regression in wound condition, may D/C ordered dressing product/s and apply Normal Saline Moist Dressing daily until next Wound Healing Center / Other MD appointment. Notify Wound Healing Center of regression in wound condition at [phone]. Please direct any NON-WOUND related issues/requests for orders to patient's Primary Care Physician Please direct any NON-WOUND related issues/requests for orders to patient's Primary Care Physician Wound #3 Left Toe Great: Continue Home Health Visits -  [place] - To change Wednesday, Friday. Continue Home Health Visits - [place] - To change Wednesday, Friday. Home Health Nurse may visit PRN to address patient s wound care needs. Home Health Nurse may visit PRN to address patient s wound care needs. FACE TO FACE ENCOUNTER: MEDICARE and MEDICAID PATIENTS: I certify that this patient is under my care and that I had a face-to-face encounter that meets the physician face-to-face encounter requirements with this patient on this date. The encounter with the patient was in whole or in part for the following MEDICAL CONDITION: (primary reason for Home Healthcare) MEDICAL NECESSITY: I certify, that based on my findings, NURSING services are a medically necessary home health service. HOME BOUND STATUS: I certify that my clinical findings support that this patient is homebound (i.e., Due to illness or injury, pt requires aid of supportive devices such as crutches, cane, wheelchairs, walkers, the use of special transportation or the assistance of another person to leave their place of residence. There is a normal inability to leave the home and doing so requires considerable and taxing effort. Other absences are for medical reasons / religious services and are infrequent or of short duration when for other reasons). FACE TO FACE ENCOUNTER: MEDICARE and MEDICAID PATIENTS: I certify that this patient is under my care and that I had a face-to-face encounter that meets the physician face-to-face encounter requirements with this patient on this date. The encounter with the patient was in whole or in part for the [name], [name] ([id number]) following MEDICAL CONDITION: (primary reason for Home Healthcare) MEDICAL NECESSITY: I certify, that based on my findings, NURSING services are a medically necessary home health service. HOME BOUND STATUS: I certify that my clinical findings support that this patient is homebound (i.e., Due to illness or injury, pt requires  aid of supportive devices such as crutches, cane, wheelchairs, walkers, the use of special transportation or the assistance of another person to leave their place of residence. There is a normal inability to leave the home and doing so requires considerable and taxing effort. Other absences are for medical reasons / religious services and are infrequent or of short duration when for other reasons). If current dressing causes regression in wound condition, may D/C ordered dressing product/s and apply Normal Saline Moist Dressing daily until next Wound Healing Center / Other MD appointment. Notify Wound Healing Center of regression in wound condition at [phone]. If current dressing causes regression in wound condition, may D/C ordered dressing product/s and apply Normal Saline Moist Dressing daily until next Wound Healing Center / Other MD appointment. Notify Wound Healing Center of regression in wound condition at [phone]. Please direct any NON-WOUND related issues/requests for orders to patient's Primary Care Physician Please direct any NON-WOUND related issues/requests for orders to patient's Primary Care Physician Negative Pressure Wound Therapy: Wound #1 Left Achilles: Wound VAC settings  at 125/130 mmHg continuous pressure. Use BLACK/GREEN foam to wound cavity. Use WHITE foam to fill any tunnel/s and/or undermining. Change VAC dressing 3 X WEEK. Change canister as indicated when full. Nurse may titrate settings and frequency of dressing changes as clinically indicated. - hydrocolloid applied between Achilles and lateral foot wound for barrier and adhesion. Home Health Nurse may d/c VAC for s/s of increased infection, significant wound regression, or uncontrolled drainage. Notify Wound Healing Center at [phone]. Apply contact layer over base of wound. Number of foam/gauze pieces used in the dressing = - 1 Follow-Up Appointments: A follow-up appointment should be  scheduled. Medication Reconciliation completed and provided to Patient/Care Provider. A Patient Clinical Summary of Care was provided to AE Debrided ulcers with curette. Will Rx with silver alginate dressings. Electronic Signature(s) Signed: 09/20/2014 1:36:35 PM By: Ardath Sax MD Previous Signature: 09/20/2014 12:39:00 PM Version By: Ardath Sax MD Entered By: Ardath Sax on 09/20/2014 13:36:35 Hamm, [name] ([id number]) -------------------------------------------------------------------------------- SuperBill Details Patient Name: [name], [name] Date of Service: 09/19/2014 Medical Record Number: [id number] Patient Account Number: [id number] Date of Birth/Sex: [birth year] (81 y.o. 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

## 2014-09-23 ENCOUNTER — Encounter: Payer: Self-pay | Admitting: Cardiology

## 2014-09-27 ENCOUNTER — Encounter: Payer: Medicare Other | Attending: Surgery | Admitting: Surgery

## 2014-09-27 DIAGNOSIS — L97521 Non-pressure chronic ulcer of other part of left foot limited to breakdown of skin: Secondary | ICD-10-CM | POA: Diagnosis not present

## 2014-09-27 DIAGNOSIS — M86372 Chronic multifocal osteomyelitis, left ankle and foot: Secondary | ICD-10-CM | POA: Insufficient documentation

## 2014-09-27 DIAGNOSIS — E11621 Type 2 diabetes mellitus with foot ulcer: Secondary | ICD-10-CM | POA: Diagnosis not present

## 2014-09-27 DIAGNOSIS — L97421 Non-pressure chronic ulcer of left heel and midfoot limited to breakdown of skin: Secondary | ICD-10-CM | POA: Diagnosis not present

## 2014-09-27 DIAGNOSIS — L97323 Non-pressure chronic ulcer of left ankle with necrosis of muscle: Secondary | ICD-10-CM | POA: Diagnosis present

## 2014-09-27 DIAGNOSIS — I70244 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: Secondary | ICD-10-CM | POA: Insufficient documentation

## 2014-09-27 DIAGNOSIS — E1151 Type 2 diabetes mellitus with diabetic peripheral angiopathy without gangrene: Secondary | ICD-10-CM | POA: Insufficient documentation

## 2014-09-27 NOTE — Progress Notes (Signed)
MICO, SPARK ([id number]) Visit Report for 09/27/2014 Arrival Information Details Patient Name: [name], [name]. Date of Service: 09/27/2014 11:00 AM Medical Record Number: [id number] Patient Account Number: [id number] Date of Birth/Sex: Dec 09, 1920 (81 y.o. Male) Treating RN: Clover Mealy, RN, BSN, [place] Sink Primary Care Physician: Lindwood Qua Other Clinician: Referring Physician: Lindwood Qua Treating Physician/Extender: Rudene Re in Treatment: 21 Visit Information History Since Last Visit Any new allergies or adverse reactions: No Patient Arrived: Wheel Chair Had a fall or experienced change in No Arrival Time: 10:53 activities of daily living that may affect Accompanied By: son risk of falls: Transfer Assistance: Manual Signs or symptoms of abuse/neglect since last No Patient Identification Verified: Yes visito Secondary Verification Process Yes Hospitalized since last visit: No Completed: Has Dressing in Place as Prescribed: Yes Patient Requires Transmission-Based No Pain Present Now: No Precautions: Patient Has Alerts: Yes Patient Alerts: 04/27/13 ABI (L) 0.91 (R) 0.71 Electronic Signature(s) Signed: 09/27/2014 10:54:07 AM By: Elpidio Eric BSN, RN Entered By: Elpidio Eric on 09/27/2014 10:54:07 [name] ([id number]) -------------------------------------------------------------------------------- Encounter Discharge Information Details Patient Name: [name], [name] Date of Service: 09/27/2014 11:00 AM Medical Record Number: [id number] Patient Account Number: [id number] Date of Birth/Sex: 1920-11-20 (81 y.o. Male) Treating RN: Clover Mealy, RN, BSN, [place] Sink Primary Care Physician: Lindwood Qua Other Clinician: Referring Physician: Lindwood Qua Treating Physician/Extender: Rudene Re in Treatment: 21 Encounter Discharge Information Items Discharge Pain Level: 0 Discharge Condition: Stable Ambulatory Status: Wheelchair Discharge Destination:  Home Transportation: Private Auto Accompanied By: son Schedule Follow-up Appointment: No Medication Reconciliation completed No and provided to Patient/Care Rea Reser: Provided on Clinical Summary of Care: 09/27/2014 Form Type Recipient Paper Patient AE Electronic Signature(s) Signed: 09/27/2014 11:40:01 AM By: Gwenlyn Perking Previous Signature: 09/27/2014 11:38:02 AM Version By: Elpidio Eric BSN, RN Entered By: Gwenlyn Perking on 09/27/2014 11:40:01 [name] ([id number]) -------------------------------------------------------------------------------- Lower Extremity Assessment Details Patient Name: [name], [name] Date of Service: 09/27/2014 11:00 AM Medical Record Number: [id number] Patient Account Number: [id number] Date of Birth/Sex: 24-Feb-1920 (81 y.o. Male) Treating RN: Clover Mealy, RN, BSN,  Sink Primary Care Physician: Lindwood Qua Other Clinician: Referring Physician: Lindwood Qua Treating Physician/Extender: Rudene Re in Treatment: 21 Vascular Assessment Pulses: Posterior Tibial Dorsalis Pedis Palpable: [Left:No] Doppler: [Left:Monophasic] Extremity colors, hair growth, and conditions: Extremity Color: [Left:Pale] Hair Growth on Extremity: [Left:No] Temperature of Extremity: [Left:Warm] Capillary Refill: [Left:< 3 seconds] Toe Nail Assessment Left: Right: Thick: Yes Discolored: Yes Deformed: Yes Improper Length and Hygiene: Yes Electronic Signature(s) Signed: 09/27/2014 10:55:00 AM By: Elpidio Eric BSN, RN Entered By: Elpidio Eric on 09/27/2014 10:55:00 [name] ([id number]) -------------------------------------------------------------------------------- Multi Wound Chart Details Patient Name: [name], [name] Date of Service: 09/27/2014 11:00 AM Medical Record Number: [id number] Patient Account Number: [id number] Date of Birth/Sex: 02/07/1920 (81 y.o. 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] Comorbid History: [1:Cataracts, Chronic sinus Cataracts, Chronic sinus Cataracts, Chronic sinus problems/congestion, Congestive Heart Failure, Congestive Heart Failure, Congestive Heart Failure, Coronary Artery Disease, Coronary Artery Disease, Coronary  Artery Disease, Hypertension, Myocardial Hypertension, Myocardial Hypertension, Myocardial Infarction, Peripheral Arterial Disease,  Type II Arterial Disease, Type II Arterial Disease, Type II Diabetes, Neuropathy] [2:problems/congestion, Infarction,  Peripheral Diabetes, Neuropathy] [3:problems/congestion, Infarction, Peripheral Diabetes, Neuropathy] Date Acquired: [1:01/24/2014] [2:01/24/2014] [3:01/24/2014] Weeks of Treatment: [1:21] [2:21] [3:21] Wound Status: [1:Open] [2:Open] [3:Open] Clustered Wound: [1:No] [2:Yes] [3:No] Pending Amputation on Yes [2:No] [3:No] Presentation: Measurements L x W x D 9.4x3x0.3 [2:0.5x0.5x0.2] [3:0.5x0.5x0.2] (cm) Area (cm) : [1:22.148] [2:0.196] [3:0.196] Volume (cm) : [1:6.644] [2:0.039] [3:0.039] % Reduction in Area: [1:10.40%] [2:96.50%] [3:77.30%] % Reduction in Volume: 10.50% [2:93.10%] [3:54.70%] Classification: [1:Full Thickness With Exposed Support Structures] [2:Full Thickness Without Exposed Support Structures] [3:Full Thickness Without Exposed Support Structures] HBO Classification: [1:Grade 2]  [2:Grade 2] [3:Grade 2] Exudate Amount: [1:Large] [2:N/A] [3:None Present] Exudate Type: [1:Serosanguineous] [2:N/A] [3:N/A] Exudate Color: red, brown N/A N/A Wound Margin: Distinct, outline attached Flat and Intact Flat and Intact Granulation Amount: Large (67-100%) Small (1-33%) None Present (0%) Granulation Quality: Pink N/A N/A Necrotic Amount: Small (1-33%) None Present (0%) Large (67-100%) Necrotic Tissue: Adherent Slough N/A Eschar Exposed Structures: Fascia: No Fascia: No Fascia: No Fat: No Fat: No Fat: No Tendon: No Tendon: No Tendon: No Muscle: No Muscle: No Muscle: No Joint: No Joint: No Joint: No Bone: No Bone: No Bone: No Limited to Skin Limited to Skin Limited to Skin Breakdown Breakdown Breakdown Epithelialization: Medium (34-66%) Small (1-33%) None Debridement: Open Wound/Selective N/A Open Wound/Selective ([id number]) - Selective [phone]) - Selective Time-Out Taken: Yes N/A Yes Pain Control: Lidocaine 4% Topical N/A Lidocaine 4% Topical Solution Solution Tissue Debrided: Fibrin/Slough, N/A Fibrin/Slough, Subcutaneous Subcutaneous Level: Non-Viable Tissue N/A Non-Viable Tissue Debridement Area (sq 28.2 N/A 0.25 cm): Instrument: Forceps, Scissors N/A Forceps, Scissors Bleeding: Minimum N/A Minimum Hemostasis Achieved: Silver Nitrate N/A Pressure Procedural Pain: 0 N/A 0 Post Procedural Pain: 0 N/A 0 Debridement Treatment Procedure was tolerated N/A Procedure was tolerated Response: well well Post Debridement 9.4x3x0.3 N/A 0.5x0.5x0.2 Measurements L x W x D (cm) Post Debridement 6.644 N/A 0.039 Volume: (cm) Periwound Skin Texture: Edema: No Edema: No Edema: No Excoriation: No Excoriation: No Excoriation: No Induration: No Induration: No Induration: No Callus: No Callus: No Callus: No Crepitus: No Crepitus: No Crepitus: No Fluctuance: No Fluctuance: No Fluctuance: No Friable: No Friable: No Friable: No Rash: No Rash:  No Rash: No Scarring: No Scarring: No Scarring: No Periwound Skin Maceration: Yes Maceration: Yes Dry/Scaly: Yes Moisture: Moist: Yes Moist: Yes Maceration: No Dry/Scaly: No Dry/Scaly: No Moist: No Periwound Skin Color: IOKEPA, GEFFRE ([id number]) Atrophie Blanche: No Atrophie Blanche: No Atrophie Blanche: No Cyanosis: No Cyanosis: No Cyanosis: No Ecchymosis: No Ecchymosis: No Ecchymosis: No Erythema: No Erythema: No Erythema: No Hemosiderin Staining: No Hemosiderin Staining: No Hemosiderin Staining: No Mottled: No Mottled: No Mottled: No Pallor: No Pallor: No Pallor: No Rubor: No Rubor: No Rubor: No Temperature: N/A No Abnormality N/A Tenderness on No No No Palpation: Wound Preparation: Ulcer Cleansing: Ulcer Cleansing: Ulcer Cleansing: Rinsed/Irrigated with Rinsed/Irrigated with Rinsed/Irrigated with Saline Saline Saline Topical Anesthetic Topical Anesthetic Topical Anesthetic Applied: Other: lidocaine Applied: Other: lidocaine Applied: Other: 4% 4% LIDOCAINE 4% Procedures Performed: Debridement N/A Debridement Treatment Notes Electronic Signature(s) Signed: 09/27/2014 11:15:21 AM By: Elpidio Eric BSN, RN Entered By: Elpidio Eric on 09/27/2014 11:15:21 Epler, [name] ([id number]) -------------------------------------------------------------------------------- Multi-Disciplinary Care Plan Details Patient Name: [name], [name] Date of Service: 09/27/2014 11:00 AM Medical Record Number: [id number] Patient Account Number: [id number] Date of Birth/Sex: Jul 09, 1920 (81 y.o. Male) Treating RN: Clover Mealy, RN, BSN, [place] Sink Primary Care Physician: Lindwood Qua Other Clinician: Referring Physician: Lindwood Qua Treating Physician/Extender: Meyer Russel,  Errol Weeks in Treatment: 21 Active Inactive Abuse / Safety / Falls / Self Care Management Nursing Diagnoses: Potential for falls Goals: Patient will remain injury free Date Initiated: 04/28/2014 Goal Status:  Active Interventions: Assess fall risk on admission and as needed Notes: Necrotic Tissue Nursing Diagnoses: Impaired tissue integrity related to necrotic/devitalized tissue Goals: Necrotic/devitalized tissue will be minimized in the wound bed Date Initiated: 04/28/2014 Goal Status: Active Interventions: Provide education on necrotic tissue and debridement process Treatment Activities: Apply topical anesthetic as ordered : 09/27/2014 Notes: Nutrition Nursing Diagnoses: Potential for alteratiion in Nutrition/Potential for imbalanced nutrition Graciano, Dayne L. ([id number]) Goals: Patient/caregiver agrees to and verbalizes understanding of need to use nutritional supplements and/or vitamins as prescribed Date Initiated: 04/28/2014 Goal Status: Active Interventions: Assess patient nutrition upon admission and as needed per policy Notes: Orientation to the Wound Care Program Nursing Diagnoses: Knowledge deficit related to the wound healing center program Goals: Patient/caregiver will verbalize understanding of the Wound Healing Center Program Date Initiated: 04/28/2014 Goal Status: Active Interventions: Provide education on orientation to the wound center Notes: Wound/Skin Impairment Nursing Diagnoses: Impaired tissue integrity Goals: Ulcer/skin breakdown will have a volume reduction of 30% by week 4 Date Initiated: 04/28/2014 Goal Status: Active Interventions: Assess ulceration(s) every visit Notes: Electronic Signature(s) Signed: 09/27/2014 11:15:08 AM By: Elpidio Eric BSN, RN Entered By: Elpidio Eric on 09/27/2014 11:15:08 Karpf, Kash [name] ([id number]) -------------------------------------------------------------------------------- Pain Assessment Details Patient Name: Branaman, Tarance L. Date of Service: 09/27/2014 11:00 AM Medical Record Number: [id number] Patient Account Number: [id number] Date of Birth/Sex: 11-Jan-1921 (81 y.o. Male) Treating RN: Clover Mealy, RN, BSN, [place] Sink Primary Care  Physician: Lindwood Qua Other Clinician: Referring Physician: Lindwood Qua Treating Physician/Extender: Rudene Re in Treatment: 21 Active Problems Location of Pain Severity and Description of Pain Patient Has Paino No Site Locations Pain Management and Medication Current Pain Management: Electronic Signature(s) Signed: 09/27/2014 10:54:14 AM By: Elpidio Eric BSN, RN Entered By: Elpidio Eric on 09/27/2014 10:54:14 Grudzinski, [name] ([id number]) -------------------------------------------------------------------------------- Patient/Caregiver Education Details Patient Name: [name], [name] Date of Service: 09/27/2014 11:00 AM Medical Record Number: [id number] Patient Account Number: [id number] Date of Birth/Gender: 22-Mar-1920 (81 y.o. Male) Treating RN: Clover Mealy, RN, BSN, [place] Sink Primary Care Physician: Lindwood Qua Other Clinician: Referring Physician: Lindwood Qua Treating Physician/Extender: Rudene Re in Treatment: 21 Education Assessment Education Provided To: Patient Education Topics Provided Basic Hygiene: Methods: Explain/Verbal Responses: State content correctly Welcome To The [facility]: Methods: Explain/Verbal Responses: State content correctly Wound Debridement: Methods: Explain/Verbal Responses: State content correctly Electronic Signature(s) Signed: 09/27/2014 11:17:32 AM By: Elpidio Eric BSN, RN Entered By: Elpidio Eric on 09/27/2014 11:17:32 Kalisz, Griff [name] ([id number]) -------------------------------------------------------------------------------- Wound Assessment Details Patient Name: [name], [name] Date of Service: 09/27/2014 11:00 AM Medical Record Number: [id number] Patient Account Number: [id number] Date of Birth/Sex: Apr 18, 1920 (81 y.o. Male) Treating RN: Clover Mealy, RN, BSN, Rita Primary Care Physician: Lindwood Qua Other Clinician: Referring Physician: Lindwood Qua Treating Physician/Extender: Rudene Re in  Treatment: 21 Wound Status Wound Number: 1 Primary Arterial Insufficiency Ulcer Etiology: Wound Location: Left Achilles Wound Open Wounding Event: Gradually Appeared Status: Date Acquired: 01/24/2014 Comorbid Cataracts, Chronic sinus Weeks Of Treatment: 21 History: problems/congestion, Congestive Heart Clustered Wound: No Failure, Coronary Artery Disease, Pending Amputation On Presentation Hypertension, Myocardial Infarction, Peripheral Arterial Disease, Type II Diabetes, Neuropathy Wound Measurements Length: (cm) 9.4 Width: (cm) 3 Depth: (cm) 0.3 Area: (cm) 22.148 Volume: (cm) 6.644 % Reduction in Area: 10.4% % Reduction in Volume: 10.5% Epithelialization: Medium (34-66%) Tunneling: No Undermining: No  Wound Description Full Thickness With Exposed Foul Odor A Classification: Support Structures Diabetic Severity Grade 2 (Wagner): Wound Margin: Distinct, outline attached Exudate Amount: Large Exudate Type: Serosanguineous Exudate Color: red, brown fter Cleansing: No Wound Bed Granulation Amount: Large (67-100%) Exposed Structure Granulation Quality: Pink Fascia Exposed: No Necrotic Amount: Small (1-33%) Fat Layer Exposed: No Necrotic Quality: Adherent Slough Tendon Exposed: No Muscle Exposed: No Joint Exposed: No Bone Exposed: No Limited to Skin Breakdown Periwound Skin Texture Seats, Tillman L. ([id number]) Texture Color No Abnormalities Noted: No No Abnormalities Noted: No Callus: No Atrophie Blanche: No Crepitus: No Cyanosis: No Excoriation: No Ecchymosis: No Fluctuance: No Erythema: No Friable: No Hemosiderin Staining: No Induration: No Mottled: No Localized Edema: No Pallor: No Rash: No Rubor: No Scarring: No Moisture No Abnormalities Noted: No Dry / Scaly: No Maceration: Yes Moist: Yes Wound Preparation Ulcer Cleansing: Rinsed/Irrigated with Saline Topical Anesthetic Applied: Other: lidocaine 4%, Treatment Notes Wound #1 (Left  Achilles) 1. Cleansed with: Clean wound with Normal Saline 4. Dressing Applied: Aquacel Ag 5. Secondary Dressing Applied Gauze and Kerlix/Conform 8. Negative Pressure Wound Therapy Wound Vac to wound continuously at 153mm/hg pressure Black Foam Apply contact layer over base of wound. Number of foam/gauze pieces used in the dressing = Change canister as needed. Notes NPWT to left achiles Electronic Signature(s) Signed: 09/27/2014 11:06:55 AM By: Elpidio Eric BSN, RN Entered By: Elpidio Eric on 09/27/2014 11:06:55 [name], [name] ([id number]) -------------------------------------------------------------------------------- Wound Assessment Details Patient Name: [name], [name] Date of Service: 09/27/2014 11:00 AM Medical Record Number: [id number] Patient Account Number: [id number] Date of Birth/Sex: Aug 22, 1920 (81 y.o. Male) Treating RN: Afful, RN, BSN, Rita Primary Care Physician: Lindwood Qua Other Clinician: Referring Physician: Lindwood Qua Treating Physician/Extender: Rudene Re in Treatment: 21 Wound Status Wound Number: 2 Primary Arterial Insufficiency Ulcer Etiology: Wound Location: Left Calcaneous - Lateral Wound Open Wounding Event: Gradually Appeared Status: Date Acquired: 01/24/2014 Comorbid Cataracts, Chronic sinus Weeks Of Treatment: 21 History: problems/congestion, Congestive Heart Clustered Wound: Yes Failure, Coronary Artery Disease, Hypertension, Myocardial Infarction, Peripheral Arterial Disease, Type II Diabetes, Neuropathy Wound Measurements Length: (cm) 0.5 Width: (cm) 0.5 Depth: (cm) 0.2 Area: (cm) 0.196 Volume: (cm) 0.039 % Reduction in Area: 96.5% % Reduction in Volume: 93.1% Epithelialization: Small (1-33%) Tunneling: No Undermining: No Wound Description Full Thickness Without Exposed Classification: Support Structures Diabetic Severity Grade 2 (Wagner): Wound Margin: Flat and Intact Wound Bed Granulation Amount: Small  (1-33%) Exposed Structure Necrotic Amount: None Present (0%) Fascia Exposed: No Fat Layer Exposed: No Tendon Exposed: No Muscle Exposed: No Joint Exposed: No Bone Exposed: No Limited to Skin Breakdown Periwound Skin Texture Texture Color No Abnormalities Noted: No No Abnormalities Noted: No Callus: No Atrophie Blanche: No [name], [name] ([id number]) Crepitus: No Cyanosis: No Excoriation: No Ecchymosis: No Fluctuance: No Erythema: No Friable: No Hemosiderin Staining: No Induration: No Mottled: No Localized Edema: No Pallor: No Rash: No Rubor: No Scarring: No Temperature / Pain Moisture Temperature: No Abnormality No Abnormalities Noted: No Dry / Scaly: No Maceration: Yes Moist: Yes Wound Preparation Ulcer Cleansing: Rinsed/Irrigated with Saline Topical Anesthetic Applied: Other: lidocaine 4%, Treatment Notes Wound #2 (Left, Lateral Calcaneous) 1. Cleansed with: Clean wound with Normal Saline 4. Dressing Applied: Aquacel Ag 5. Secondary Dressing Applied Gauze and Kerlix/Conform 8. Negative Pressure Wound Therapy Wound Vac to wound continuously at 146mm/hg pressure Black Foam Apply contact layer over base of wound. Number of foam/gauze pieces used in the dressing = Change canister as needed. Notes NPWT to left achiles Electronic  Signature(s) Signed: 09/27/2014 11:07:20 AM By: Elpidio Eric BSN, RN Entered By: Elpidio Eric on 09/27/2014 11:07:20 [name], [name] ([id number]) -------------------------------------------------------------------------------- Wound Assessment Details Patient Name: [name], [name] Date of Service: 09/27/2014 11:00 AM Medical Record Number: [id number] Patient Account Number: [id number] Date of Birth/Sex: 1920/06/22 (81 y.o. Male) Treating RN: Afful, RN, BSN, Rita Primary Care Physician: Lindwood Qua Other Clinician: Referring Physician: Lindwood Qua Treating Physician/Extender: Rudene Re in Treatment: 21 Wound  Status Wound Number: 3 Primary Arterial Insufficiency Ulcer Etiology: Wound Location: Left Toe Great Wound Open Wounding Event: Gradually Appeared Status: Date Acquired: 01/24/2014 Comorbid Cataracts, Chronic sinus Weeks Of Treatment: 21 History: problems/congestion, Congestive Heart Clustered Wound: No Failure, Coronary Artery Disease, Hypertension, Myocardial Infarction, Peripheral Arterial Disease, Type II Diabetes, Neuropathy Wound Measurements Length: (cm) 0.5 Width: (cm) 0.5 Depth: (cm) 0.2 Area: (cm) 0.196 Volume: (cm) 0.039 % Reduction in Area: 77.3% % Reduction in Volume: 54.7% Epithelialization: None Tunneling: No Undermining: No Wound Description Full Thickness Without Exposed Classification: Support Structures Diabetic Severity Grade 2 (Wagner): Wound Margin: Flat and Intact Exudate Amount: None Present Wound Bed Granulation Amount: None Present (0%) Exposed Structure Necrotic Amount: Large (67-100%) Fascia Exposed: No Necrotic Quality: Eschar Fat Layer Exposed: No Tendon Exposed: No Muscle Exposed: No Joint Exposed: No Bone Exposed: No Limited to Skin Breakdown Periwound Skin Texture Texture Color No Abnormalities Noted: No No Abnormalities Noted: No [name], [name] ([id number]) Callus: No Atrophie Blanche: No Crepitus: No Cyanosis: No Excoriation: No Ecchymosis: No Fluctuance: No Erythema: No Friable: No Hemosiderin Staining: No Induration: No Mottled: No Localized Edema: No Pallor: No Rash: No Rubor: No Scarring: No Moisture No Abnormalities Noted: No Dry / Scaly: Yes Maceration: No Moist: No Wound Preparation Ulcer Cleansing: Rinsed/Irrigated with Saline Topical Anesthetic Applied: Other: LIDOCAINE 4%, Treatment Notes Wound #3 (Left Toe Great) 1. Cleansed with: Clean wound with Normal Saline 4. Dressing Applied: Aquacel Ag 5. Secondary Dressing Applied Gauze and Kerlix/Conform 8. Negative Pressure Wound  Therapy Wound Vac to wound continuously at 18mm/hg pressure Black Foam Apply contact layer over base of wound. Number of foam/gauze pieces used in the dressing = Change canister as needed. Notes NPWT to left achiles Electronic Signature(s) Signed: 09/27/2014 11:08:23 AM By: Elpidio Eric BSN, RN Entered By: Elpidio Eric on 09/27/2014 11:08:23 [name], [name] ([id number]) -------------------------------------------------------------------------------- Vitals Details Patient Name: Cecena, Eustace L. Date of Service: 09/27/2014 11:00 AM Medical Record Number: [id number] Patient Account Number: [id number] Date of Birth/Sex: 08-08-1920 (81 y.o. Male) Treating RN: Clover Mealy, RN, BSN, Rita Primary Care Physician: Lindwood Qua Other Clinician: Referring Physician: Lindwood Qua Treating Physician/Extender: Rudene Re in Treatment: 21 Vital Signs Time Taken: 10:59 Pulse (bpm): 80 Height (in): 70 Respiratory Rate (breaths/min): 22 Weight (lbs): 200 Blood Pressure (mmHg): 111/45 Body Mass Index (BMI): 28.7 Reference Range: 80 - 120 mg / dl Notes Temp probe will not pick up pt temp. Electronic Signature(s) Signed: 09/27/2014 11:00:13 AM By: Elpidio Eric BSN, RN Entered By: Elpidio Eric on 09/27/2014 11:00:13

## 2014-09-28 NOTE — Progress Notes (Signed)
ALTO, GANDOLFO ([id number]) Visit Report for 09/27/2014 Chief Complaint Document Details Patient Name: [name], [name] Date of Service: 09/27/2014 11:00 AM Medical Record Number: [id number] Patient Account Number: [id number] Date of Birth/Sex: [birth year] (81 y.o. Male) Treating RN: Huel Coventry Primary Care Physician: Lindwood Qua Other Clinician: Referring Physician: Lindwood Qua Treating Physician/Extender: Rudene Re in Treatment: 21 Information Obtained from: Patient Chief Complaint Patient presents to the wound care center for a consult due non healing wound 81 year old gentleman who comes with a history of having some ulcerated areas on his heels since February 2016 and then a large injury to his left posterior heel and ankle since about a month. Electronic Signature(s) Signed: 09/27/2014 11:36:32 AM By: Evlyn Kanner MD, FACS Entered By: Evlyn Kanner on 09/27/2014 11:36:31 Flaming, [name] ([id number]) -------------------------------------------------------------------------------- Debridement Details Patient Name: [name], [name] Date of Service: 09/27/2014 11:00 AM Medical Record Number: [id number] Patient Account Number: [id number] Date of Birth/Sex: 11-14-1920 (81 y.o. Male) Treating RN: Huel Coventry Primary Care Physician: Lindwood Qua Other Clinician: Referring Physician: Lindwood Qua Treating Physician/Extender: Rudene Re in Treatment: 21 Debridement Performed for Wound #1 Left Achilles Assessment: Performed By: Physician Tristan Schroeder., MD Debridement: Debridement Pre-procedure Yes Verification/Time Out Taken: Start Time: 11:12 Pain Control: Lidocaine 4% Topical Solution Level: Skin/Subcutaneous Tissue Total Area Debrided (L x 3 (cm) x 3 (cm) = 9 (cm) W): Tissue and other Viable, Non-Viable, Fibrin/Slough, Subcutaneous material debrided: Instrument: Forceps, Scissors Bleeding: Minimum Hemostasis Achieved: Silver Nitrate End Time:  11:14 Procedural Pain: 0 Post Procedural Pain: 0 Response to Treatment: Procedure was tolerated well Post Debridement Measurements of Total Wound Length: (cm) 9.4 Width: (cm) 3 Depth: (cm) 0.3 Volume: (cm) 6.644 Post Procedure Diagnosis Same as Pre-procedure Electronic Signature(s) Signed: 09/27/2014 11:36:03 AM By: Evlyn Kanner MD, FACS Signed: 09/27/2014 5:22:53 PM By: Elliot Gurney RN, BSN, Kim RN, BSN Previous Signature: 09/27/2014 11:14:52 AM Version By: Elpidio Eric BSN, RN Entered By: Evlyn Kanner on 09/27/2014 11:36:03 Creelman, Peter L. ([id number]) -------------------------------------------------------------------------------- Debridement Details Patient Name: [name], [name] Date of Service: 09/27/2014 11:00 AM Medical Record Number: [id number] Patient Account Number: [id number] Date of Birth/Sex: Sep 27, 1920 (81 y.o. Male) Treating RN: Huel Coventry Primary Care Physician: Lindwood Qua Other Clinician: Referring Physician: Lindwood Qua Treating Physician/Extender: Rudene Re in Treatment: 21 Debridement Performed for Wound #3 Left Toe Great Assessment: Performed By: Physician Tristan Schroeder., MD Debridement: Debridement Pre-procedure Yes Verification/Time Out Taken: Start Time: 11:10 Pain Control: Lidocaine 4% Topical Solution Level: Skin/Subcutaneous Tissue Total Area Debrided (L x 0.5 (cm) x 0.5 (cm) = 0.25 (cm) W): Tissue and other Viable, Non-Viable, Fibrin/Slough, Subcutaneous material debrided: Instrument: Forceps, Scissors Bleeding: Minimum Hemostasis Achieved: Pressure End Time: 11:12 Procedural Pain: 0 Post Procedural Pain: 0 Response to Treatment: Procedure was tolerated well Post Debridement Measurements of Total Wound Length: (cm) 0.5 Width: (cm) 0.5 Depth: (cm) 0.2 Volume: (cm) 0.039 Post Procedure Diagnosis Same as Pre-procedure Electronic Signature(s) Signed: 09/27/2014 11:36:24 AM By: Evlyn Kanner MD, FACS Signed: 09/27/2014 5:22:53  PM By: Elliot Gurney RN, BSN, Kim RN, BSN Previous Signature: 09/27/2014 11:13:23 AM Version By: Elpidio Eric BSN, RN Entered By: Evlyn Kanner on 09/27/2014 11:36:24 Sandra, [name] ([id number]) -------------------------------------------------------------------------------- HPI Details Patient Name: [name], [name] Date of Service: 09/27/2014 11:00 AM Medical Record Number: [id number] Patient Account Number: [id number] Date of Birth/Sex: May 06, 1920 (80 y.o. Male) Treating RN: Huel Coventry Primary Care Physician: Lindwood Qua Other Clinician: Referring Physician: Lindwood Qua Treating Physician/Extender: Rudene Re in Treatment: 21 History of Present  Illness HPI Description: 81 year old gentleman who was known to be a diabetic for many years has peripheral neuropathy recently went to his primary care doctor for a punctured wound on his left heel which was something he had noticed. The patient then was referred to a podiatrist who referred him to Dr. Wyn Quaker in the vascular surgery department and I understand the procedure was done on 03/14/2014 with a left lower extremity vascular procedure and stenting was done. Details of this are not available at the present time. The patient has been applying Neosporin to his leg and has not had any wound care addressed so far. patient has also had a history of coronary artery disease in the past and hasn't had a CABG and a pacemaker placement in the remote past.Other details and notes are pending. the patient is not in pain lives alone and has some home health and other aides coming to help him with his daily chores and meals. reviewing the vascular notes I understand the procedure was done on 03/14/2014 and he was operated by Dr. Wyn Quaker. he had a catheter placement to the left peroneal artery and a aortogram and selective left lower extremity angiogram was done. He also had a percutaneous transluminal angioplasty of the left peroneal artery and the  tibioperoneal trunk. The distal SFA and above-knee popliteal artery were also angioplastied. A subcutaneous stent placement to the distal superficial femoral artery was also done. 05/05/2014 -- the patient has not had any change in his health and after much consideration is decided that he does not want HBOT as he is claustrophobic and was unable to tolerate being in the chamber for 90 minutes. I have discussed with him that his most recent x-ray of the foot shows that he has the distal phalanx of the left great toe showing changes with osteomyelitis cannot be excluded at that site. He tells me that he cannot have an MRI because of his defibrillator and hence we will order a triple phase bone scan. I have also reviewed his culture report which shows several organisms and he has sensitivity to tetracycline and we have recommended he takes this for 14 days and this has been given to him on 05/02/2014 05/12/2014 the bone scan done on 05/10/2014 shows #1 findings are worrisome for osteomyelitis involving the calcaneus of the left foot, #2 increase uptake localizing to the left second and third toe on all 3 phases. Cannot rule out osteomyelitis in this area. And #3 left foot cellulitis. 05/19/2014 -- reviewed several reports which we have received back on [facility]. #1 chest x-ray done on April 21 shows chronic changes in the left base no acute findings. #2 his CBC is within normal limits. #3 hemoglobin A1c was 8.5%. #4 EKG done on 26 April was within normal limits due to his electronic ventricular pacemaker. 05/26/2014 -- he has had his PICC line placed and is taking vancomycin daily basis. He is to start hyperbaric oxygen therapy on this coming Monday. 06/09/2014 -- he saw Dr. Sampson Goon yesterday and besides his IV antibiotic I believe a oral antibiotic was DONIVIN, WIRT. ([id number]) also given and this may be Cipro. Patient is feeling fine otherwise does not have any symptoms though his  blood pressure this morning in the wound center has been 90/50. We will check his blood pressure in the supine position. 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 Complained of worsening fatigue yesterday after HBO. Feels better today. Has appointment with PCP  this afternoon. He wishes to hold off on HBO until next week. No significant pain. No fever or chills. Stable drainage. 06/23/2014 Adolpho has taken a break from HBO T and has been feeling a little better. He was seen by the nurse practitioner at his PCPs office and at that time his vitals were stable and they did get some EKG done which was within normal limits. There have sent out some lab work which we still have to receive reports. He is going to see his PCP back this afternoon and will be seeing Dr. Sampson Goon tomorrow for review. addendum: we have received labs from his PCPs office and note that his potassium is high at 5.4 and his BUN/creatinine is slightly raised. His blood sugar was 216. His total protein and albumen were 5.9 and 3 and this was low. His HandH was 10.3 and 31.4 WBC was 8.2 and his platelets were 304. CRP was 29.6 which was high. Vancomycin trough was 19.1 which was normal TSH was 3.11 which was normal 06/30/2014 -- Eliberto Ivory feels weak today and he feels his stomach is acting up and his not feeling up to doing hyperbaric oxygen therapy. He would like to take a break today and tomorrow and resume on Monday. He is on vancomycin and Levaquin as per ID and he has an appointment to see the vascular surgeons in about 10 days' time. 07/07/2014 -- Eliberto Ivory has been feeling weak all along and he has decided that he is not going to continue with hyperbaric oxygen therapy and a longer period. He had finished 16 of his 40 treatments and will not take anymore. He has an appointment with ID tomorrow and also his vascular surgeons and I will have a conference with both of them regarding his  further care. 07/14/2014 -- over the last week I have contacted Dr. Sampson Goon and Dr. Wyn Quaker and discussed his care. Dr. Wyn Quaker agrees that he will take him to the OR and debrided his wound and use a wound VAC. Dr. Sampson Goon has altered his anti-biotic regimen - vancomycin has been stopped and he is on oral levofloxacin. The patient's surgery is scheduled for July 7. 07/21/2014 -- he continues to feel unwell and has a lot of GI disturbances to probably his antibiotics. He also has developed a new blister on his left fourth toe and is concerned about his upcoming surgery which is scheduled for next Thursday. 08/04/2014 -- he was taken to the OR on 07/28/2014 and a debridement of the left heel was done of skin and soft tissue and the exposed calcaneum to remove all nonviable tissue. A wound VAC was then applied at the end of the procedure. 08/12/2014 -- he was seen by Dr. Sampson Goon today and he is going to continue one more week of Levaquin and then stop it and see him back in 3 weeks' time. I have reviewed his notes from today. 08/29/2014 -- he saw Dr. Wyn Quaker his surgeon who did not have any other suggestions except for me to proceed with the skin substitute when ready. 09/05/2014 -- he saw Dr. Sampson Goon last week and he has done some tests and is awaiting his decision regarding putting him on further antibiotics. 09/12/2014 -- he was willing to have a plastic surgery opinion after giving it careful thought. We will set him ENGLISH, CRAIGHEAD. ([id number]) up for this at [facility] as per his choice. 09/27/2014 -- he has an appointment to see the plastic surgeons next Monday at [facility]. Electronic Signature(s) Signed: 09/27/2014  11:36:58 AM By: Evlyn Kanner MD, FACS Entered By: Evlyn Kanner on 09/27/2014 11:36:57 Beissel, [name] ([id number]) -------------------------------------------------------------------------------- Physical Exam Details Patient Name: Goodell, Reyaansh L. Date of Service:  09/27/2014 11:00 AM Medical Record Number: [id number] Patient Account Number: [id number] Date of Birth/Sex: May 28, 1920 (81 y.o. Male) Treating RN: Huel Coventry Primary Care Physician: Lindwood Qua Other Clinician: Referring Physician: Lindwood Qua Treating Physician/Extender: Rudene Re in Treatment: 21 Constitutional . Pulse regular. Respirations normal and unlabored. Afebrile. . Eyes Nonicteric. Reactive to light. Ears, Nose, Mouth, and Throat Lips, teeth, and gums WNL.Marland Kitchen Moist mucosa without lesions . Neck supple and nontender. No palpable supraclavicular or cervical adenopathy. Normal sized without goiter. Respiratory WNL. No retractions.. Cardiovascular Pedal Pulses WNL. No clubbing, cyanosis or edema. Chest Breasts symmetical and no nipple discharge.. Breast tissue WNL, no masses, lumps, or tenderness.. Lymphatic No adneopathy. No adenopathy. No adenopathy. Musculoskeletal Adexa without tenderness or enlargement.. Digits and nails w/o clubbing, cyanosis, infection, petechiae, ischemia, or inflammatory conditions.. Integumentary (Hair, Skin) No suspicious lesions. No crepitus or fluctuance. No peri-wound warmth or erythema. No masses.Marland Kitchen Psychiatric Judgement and insight Intact.. No evidence of depression, anxiety, or agitation.. Notes The left big toe has some debris and after sharply debriding this, it is probing down to bone. The left lateral calcaneum area has some maceration and the wound itself looks fairly clean. The Achilles tendon has some debris at the inferior end and this was sharply dissected and there was significant bleeding which needed to be controlled with Silver nitrate. Electronic Signature(s) Signed: 09/27/2014 11:39:39 AM By: Evlyn Kanner MD, FACS Entered By: Evlyn Kanner on 09/27/2014 11:39:38 Milo, [name] ([id number]) -------------------------------------------------------------------------------- Physician Orders Details Patient Name:  Ovitt, Amoni L. Date of Service: 09/27/2014 11:00 AM Medical Record Number: [id number] Patient Account Number: [id number] Date of Birth/Sex: [birth year] (81 y.o. Male) Treating RN: Clover Mealy, RN, BSN, Renovo Sink Primary Care Physician: Lindwood Qua Other Clinician: Referring Physician: Lindwood Qua Treating Physician/Extender: Rudene Re in Treatment: 25 Verbal / Phone Orders: Yes Clinician: Afful, RN, BSN, Rita Read Back and Verified: Yes Diagnosis Coding Wound Cleansing Wound #1 Left Achilles o Clean wound with Normal Saline. o May Shower, gently pat wound dry prior to applying new dressing. o May shower with protection. Anesthetic Wound #1 Left Achilles o Topical Lidocaine 4% cream applied to wound bed prior to debridement Wound #2 Left,Lateral Calcaneous o Topical Lidocaine 4% cream applied to wound bed prior to debridement Wound #3 Left Toe Great o Topical Lidocaine 4% cream applied to wound bed prior to debridement Skin Barriers/Peri-Wound Care Wound #1 Left Achilles o Skin Prep Wound #2 Left,Lateral Calcaneous o Skin Prep Wound #3 Left Toe Great o Skin Prep Primary Wound Dressing Wound #1 Left Achilles o Mepitel One Wound #2 Left,Lateral Calcaneous o Aquacel Ag Wound #3 Left [address] Ag Cora, Maceo L. ([id number]) Secondary Dressing Wound #2 Left,Lateral Calcaneous o Boardered Foam Dressing Dressing Change Frequency Wound #1 Left Achilles o Change Dressing Monday, Wednesday, Friday Wound #2 Left,Lateral Calcaneous o Change Dressing Monday, Wednesday, Friday Wound #3 Left Toe Great o Change Dressing Monday, Wednesday, Friday Follow-up Appointments Wound #1 Left Achilles o Return Appointment in 2 weeks. Wound #2 Left,Lateral Calcaneous o Return Appointment in 1 week. Wound #3 Left Toe Great o Return Appointment in 1 week. Home Health Wound #1 Left Achilles o Continue Home Health Visits - [place] - To  change Wednesday, Friday. o Continue Home Health Visits - [place] - To change Wednesday, Friday. Hydrocolloid used between Achilles  and lateral foot wound as barrier for moisture. o Home Health Nurse may visit PRN to address patientos wound care needs. o Home Health Nurse may visit PRN to address patientos wound care needs. o FACE TO FACE ENCOUNTER: MEDICARE and MEDICAID PATIENTS: I certify that this patient is under my care and that I had a face-to-face encounter that meets the physician face-to-face encounter requirements with this patient on this date. The encounter with the patient was in whole or in part for the following MEDICAL CONDITION: (primary reason for Home Healthcare) MEDICAL NECESSITY: I certify, that based on my findings, NURSING services are a medically necessary home health service. HOME BOUND STATUS: I certify that my clinical findings support that this patient is homebound (i.e., Due to illness or injury, pt requires aid of supportive devices such as crutches, cane, wheelchairs, walkers, the use of special transportation or the assistance of another person to leave their place of residence. There is a normal inability to leave the home and doing so requires considerable and taxing effort. Other absences are for medical reasons / religious services and are infrequent or of short duration when for other reasons). o FACE TO FACE ENCOUNTER: MEDICARE and MEDICAID PATIENTS: I certify that this patient is under my care and that I had a face-to-face encounter that meets the physician face-to-face encounter requirements with this patient on this date. The encounter with the patient was in whole or in part for the following MEDICAL CONDITION: (primary reason for Home Healthcare) MEDICAL NECESSITY: I certify, that based on my findings, NURSING services are a medically ARLOW, SPIERS. ([id number]) necessary home health service. HOME BOUND STATUS: I certify that my clinical  findings support that this patient is homebound (i.e., Due to illness or injury, pt requires aid of supportive devices such as crutches, cane, wheelchairs, walkers, the use of special transportation or the assistance of another person to leave their place of residence. There is a normal inability to leave the home and doing so requires considerable and taxing effort. Other absences are for medical reasons / religious services and are infrequent or of short duration when for other reasons). o If current dressing causes regression in wound condition, may D/C ordered dressing product/s and apply Normal Saline Moist Dressing daily until next Wound Healing Center / Other MD appointment. Notify Wound Healing Center of regression in wound condition at [phone]. o If current dressing causes regression in wound condition, may D/C ordered dressing product/s and apply Normal Saline Moist Dressing daily until next Wound Healing Center / Other MD appointment. Notify Wound Healing Center of regression in wound condition at [phone]. o Please direct any NON-WOUND related issues/requests for orders to patient's Primary Care Physician o Please direct any NON-WOUND related issues/requests for orders to patient's Primary Care Physician Wound #2 Left,Lateral Calcaneous o Continue Home Health Visits - [place] - To change Wednesday, Friday. o Continue Home Health Visits - [place] - To change Wednesday, Friday. o Home Health Nurse may visit PRN to address patientos wound care needs. o Home Health Nurse may visit PRN to address patientos wound care needs. o FACE TO FACE ENCOUNTER: MEDICARE and MEDICAID PATIENTS: I certify that this patient is under my care and that I had a face-to-face encounter that meets the physician face-to-face encounter requirements with this patient on this date. The encounter with the patient was in whole or in part for the following MEDICAL CONDITION: (primary  reason for Home Healthcare) MEDICAL NECESSITY: I certify, that based on my findings,  NURSING services are a medically necessary home health service. HOME BOUND STATUS: I certify that my clinical findings support that this patient is homebound (i.e., Due to illness or injury, pt requires aid of supportive devices such as crutches, cane, wheelchairs, walkers, the use of special transportation or the assistance of another person to leave their place of residence. There is a normal inability to leave the home and doing so requires considerable and taxing effort. Other absences are for medical reasons / religious services and are infrequent or of short duration when for other reasons). o FACE TO FACE ENCOUNTER: MEDICARE and MEDICAID PATIENTS: I certify that this patient is under my care and that I had a face-to-face encounter that meets the physician face-to-face encounter requirements with this patient on this date. The encounter with the patient was in whole or in part for the following MEDICAL CONDITION: (primary reason for Home Healthcare) MEDICAL NECESSITY: I certify, that based on my findings, NURSING services are a medically necessary home health service. HOME BOUND STATUS: I certify that my clinical findings support that this patient is homebound (i.e., Due to illness or injury, pt requires aid of supportive devices such as crutches, cane, wheelchairs, walkers, the use of special transportation or the assistance of another person to leave their place of residence. There is a normal inability to leave the home and doing so requires considerable and taxing effort. Other absences are for medical reasons / religious services and are infrequent or of short duration when for other reasons). o If current dressing causes regression in wound condition, may D/C ordered dressing product/s and apply Normal Saline Moist Dressing daily until next Wound Healing Center / Other MD appointment. Notify  Wound Healing Center of regression in wound condition at [phone]. Sarinana, Knut L. ([id number]) o If current dressing causes regression in wound condition, may D/C ordered dressing product/s and apply Normal Saline Moist Dressing daily until next Wound Healing Center / Other MD appointment. Notify Wound Healing Center of regression in wound condition at [phone]. o Please direct any NON-WOUND related issues/requests for orders to patient's Primary Care Physician o Please direct any NON-WOUND related issues/requests for orders to patient's Primary Care Physician Wound #3 Left Toe Great o Continue Home Health Visits - [place] - To change Wednesday, Friday. o Continue Home Health Visits - [place] - To change Wednesday, Friday. o Home Health Nurse may visit PRN to address patientos wound care needs. o Home Health Nurse may visit PRN to address patientos wound care needs. o FACE TO FACE ENCOUNTER: MEDICARE and MEDICAID PATIENTS: I certify that this patient is under my care and that I had a face-to-face encounter that meets the physician face-to-face encounter requirements with this patient on this date. The encounter with the patient was in whole or in part for the following MEDICAL CONDITION: (primary reason for Home Healthcare) MEDICAL NECESSITY: I certify, that based on my findings, NURSING services are a medically necessary home health service. HOME BOUND STATUS: I certify that my clinical findings support that this patient is homebound (i.e., Due to illness or injury, pt requires aid of supportive devices such as crutches, cane, wheelchairs, walkers, the use of special transportation or the assistance of another person to leave their place of residence. There is a normal inability to leave the home and doing so requires considerable and taxing effort. Other absences are for medical reasons / religious services and are infrequent or of short duration when for other  reasons). o FACE TO FACE ENCOUNTER:  MEDICARE and MEDICAID PATIENTS: I certify that this patient is under my care and that I had a face-to-face encounter that meets the physician face-to-face encounter requirements with this patient on this date. The encounter with the patient was in whole or in part for the following MEDICAL CONDITION: (primary reason for Home Healthcare) MEDICAL NECESSITY: I certify, that based on my findings, NURSING services are a medically necessary home health service. HOME BOUND STATUS: I certify that my clinical findings support that this patient is homebound (i.e., Due to illness or injury, pt requires aid of supportive devices such as crutches, cane, wheelchairs, walkers, the use of special transportation or the assistance of another person to leave their place of residence. There is a normal inability to leave the home and doing so requires considerable and taxing effort. Other absences are for medical reasons / religious services and are infrequent or of short duration when for other reasons). o If current dressing causes regression in wound condition, may D/C ordered dressing product/s and apply Normal Saline Moist Dressing daily until next Wound Healing Center / Other MD appointment. Notify Wound Healing Center of regression in wound condition at [phone]. o If current dressing causes regression in wound condition, may D/C ordered dressing product/s and apply Normal Saline Moist Dressing daily until next Wound Healing Center / Other MD appointment. Notify Wound Healing Center of regression in wound condition at [phone]. o Please direct any NON-WOUND related issues/requests for orders to patient's Primary Care Physician o Please direct any NON-WOUND related issues/requests for orders to patient's Primary Care Physician Negative Pressure Wound Therapy Wound #1 Left Achilles Sizer, Mitsuo L. ([id number]) o Wound VAC settings at 125/130 mmHg  continuous pressure. Use BLACK/GREEN foam to wound cavity. Use WHITE foam to fill any tunnel/s and/or undermining. Change VAC dressing 3 X WEEK. Change canister as indicated when full. Nurse may titrate settings and frequency of dressing changes as clinically indicated. - appy aquacel ag to base of wound o Home Health Nurse may d/c VAC for s/s of increased infection, significant wound regression, or uncontrolled drainage. Notify Wound Healing Center at [phone]. o Number of foam/gauze pieces used in the dressing = - 1 Electronic Signature(s) Signed: 09/27/2014 11:37:29 AM By: Elpidio Eric BSN, RN Signed: 09/27/2014 4:40:03 PM By: Evlyn Kanner MD, FACS Previous Signature: 09/27/2014 11:36:12 AM Version By: Elpidio Eric BSN, RN Previous Signature: 09/27/2014 11:15:56 AM Version By: Elpidio Eric BSN, RN Entered By: Elpidio Eric on 09/27/2014 11:37:29 LUKA, STOHR ([id number]) -------------------------------------------------------------------------------- Prescription 09/27/2014 Patient Name: Bilello, Jabarri L. Physician: Evlyn Kanner MD Date of Birth: 1920-11-01 NPI#: [id number] Sex: Judie Petit DEA#: [id number] Phone #: [phone] License #: Patient Address: [facility] [address] [place], [place] [address] [facility] [address], [address] [place], [place] [address] [phone] Allergies ACE Inhibitors Reaction: chest pain Severity: Severe Physician's Orders Wound VAC settings at 125/130 mmHg continuous pressure. Use BLACK/GREEN foam to wound cavity. Use WHITE foam to fill any tunnel/s and/or undermining. Change VAC dressing 3 X WEEK. Change canister as indicated when full. Nurse may titrate settings and frequency of dressing changes as clinically indicated. - appy aquacel ag to base of wound Signature(s): Date(s): Electronic Signature(s) Signed: 09/27/2014 4:40:03 PM By: Evlyn Kanner MD, FACS Signed: 09/27/2014 5:20:51 PM  By: Elpidio Eric BSN, RN Entered By: Elpidio Eric on 09/27/2014 11:37:30 Truby, Donna [name] ([id number]) --------------------------------------------------------------------------------  Problem List Details Patient Name: Ozimek, Rogelio L. Date of Service: 09/27/2014 11:00 AM Medical  Record Number: [id number] Patient Account Number: [id number] Date of Birth/Sex: 1920-03-13 (81 y.o. 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 Date of Service: 09/27/2014 11:00 AM Medical Record Number: [id number] Patient Account Number: [id number] Date of Birth/Sex: Jun 17, 1920 (81 y.o. Male) Treating RN: Clover Mealy, RN, BSN, Sugden Sink Primary Care Physician: Lindwood Qua Other Clinician: Referring Physician: Lindwood Qua Treating Physician/Extender: Rudene Re in Treatment: 21 Subjective Chief Complaint Information obtained from Patient Patient presents to the wound care  center for a consult due non healing wound 81 year old gentleman who comes with a history of having some ulcerated areas on his heels since February 2016 and then a large injury to his left posterior heel and ankle since about a month. History of Present Illness (HPI) 81 year old gentleman who was known to be a diabetic for many years has peripheral neuropathy recently went to his primary care doctor for a punctured wound on his left heel which was something he had noticed. The patient then was referred to a podiatrist who referred him to Dr. Wyn Quaker in the vascular surgery department and I understand the procedure was done on 03/14/2014 with a left lower extremity vascular procedure and stenting was done. Details of this are not available at the present time. The patient has been applying Neosporin to his leg and has not had any wound care addressed so far. patient has also had a history of coronary artery disease in the past and hasn't had a CABG and a pacemaker placement in the remote past.Other details and notes are pending. the patient is not in pain lives alone and has some home health and other aides coming to help him with his daily chores and meals. reviewing the vascular notes I understand the procedure was done on 03/14/2014 and he was operated by Dr. Wyn Quaker. he had a catheter placement to the left peroneal artery and a aortogram and selective left lower extremity angiogram was done. He also had a percutaneous transluminal angioplasty of the left peroneal artery and the tibioperoneal trunk. The distal SFA and above-knee popliteal artery were also angioplastied. A subcutaneous stent placement to the distal superficial femoral artery was also done. 05/05/2014 -- the patient has not had any change in his health and after much consideration is decided that he does not want HBOT as he is claustrophobic and was unable to tolerate being in the chamber for 90 minutes. I have discussed with him that  his most recent x-ray of the foot shows that he has the distal phalanx of the left great toe showing changes with osteomyelitis cannot be excluded at that site. He tells me that he cannot have an MRI because of his defibrillator and hence we will order a triple phase bone scan.  I have also reviewed his culture report which shows several organisms and he has sensitivity to tetracycline and we have recommended he takes this for 14 days and this has been given to him on 05/02/2014 05/12/2014 the bone scan done on 05/10/2014 shows #1 findings are worrisome for osteomyelitis involving the calcaneus of the left foot, #2 increase uptake localizing to the left second and third toe on all 3 phases. Cannot rule out osteomyelitis in this area. And #3 left foot cellulitis. 05/19/2014 -- reviewed several reports which we have received back on Ever[facility] Coombe. #1 chest x-ray done on April 21 shows chronic changes in the left base no acute findings. Pilot, Jarman L. ([id number]) #2 his CBC is within normal limits. #3 hemoglobin A1c was 8.5%. #4 EKG done on 26 April was within normal limits due to his electronic ventricular pacemaker. 05/26/2014 -- he has had his PICC line placed and is taking vancomycin daily basis. He is to start hyperbaric oxygen therapy on this coming Monday. 06/09/2014 -- he saw Dr. Sampson Goon yesterday and besides his IV antibiotic I believe a oral antibiotic was also given and this may be Cipro. Patient is feeling fine otherwise does not have any symptoms though his blood pressure this morning in the wound center has been 90/50. We will check his blood pressure in the supine position. 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 Complained of worsening fatigue yesterday after HBO. Feels better today. Has appointment with PCP this afternoon. He wishes to hold off on HBO until next week. No significant pain. No fever or chills. Stable drainage. 06/23/2014  Bastien has taken a break from HBO T and has been feeling a little better. He was seen by the nurse practitioner at his PCPs office and at that time his vitals were stable and they did get some EKG done which was within normal limits. There have sent out some lab work which we still have to receive reports. He is going to see his PCP back this afternoon and will be seeing Dr. Sampson Goon tomorrow for review. addendum: we have received labs from his PCPs office and note that his potassium is high at 5.4 and his BUN/creatinine is slightly raised. His blood sugar was 216. His total protein and albumen were 5.9 and 3 and this was low. His HandH was 10.3 and 31.4 WBC was 8.2 and his platelets were 304. CRP was 29.6 which was high. Vancomycin trough was 19.1 which was normal TSH was 3.11 which was normal 06/30/2014 -- Eliberto Ivory feels weak today and he feels his stomach is acting up and his not feeling up to doing hyperbaric oxygen therapy. He would like to take a break today and tomorrow and resume on Monday. He is on vancomycin and Levaquin as per ID and he has an appointment to see the vascular surgeons in about 10 days' time. 07/07/2014 -- Eliberto Ivory has been feeling weak all along and he has decided that he is not going to continue with hyperbaric oxygen therapy and a longer period. He had finished 16 of his 40 treatments and will not take anymore. He has an appointment with ID tomorrow and also his vascular surgeons and I will have a conference with both of them regarding his further care. 07/14/2014 -- over the last week I have contacted Dr. Sampson Goon and Dr. Wyn Quaker and discussed his care. Dr. Wyn Quaker agrees that he will take him to the OR and debrided his wound and use a wound  VAC. Dr. Sampson Goon has altered his anti-biotic regimen - vancomycin has been stopped and he is on oral levofloxacin. The patient's surgery is scheduled for July 7. 07/21/2014 -- he continues to feel unwell and has a lot of GI  disturbances to probably his antibiotics. He also has developed a new blister on his left fourth toe and is concerned about his upcoming surgery which is scheduled for next Thursday. 08/04/2014 -- he was taken to the OR on 07/28/2014 and a debridement of the left heel was done of skin and soft tissue and the exposed calcaneum to remove all nonviable tissue. A wound VAC was then applied at the end of the procedure. CHASEN, MENDELL ([id number]) 08/12/2014 -- he was seen by Dr. Sampson Goon today and he is going to continue one more week of Levaquin and then stop it and see him back in 3 weeks' time. I have reviewed his notes from today. 08/29/2014 -- he saw Dr. Wyn Quaker his surgeon who did not have any other suggestions except for me to proceed with the skin substitute when ready. 09/05/2014 -- he saw Dr. Sampson Goon last week and he has done some tests and is awaiting his decision regarding putting him on further antibiotics. 09/12/2014 -- he was willing to have a plastic surgery opinion after giving it careful thought. We will set him up for this at [facility] as per his choice. 09/27/2014 -- he has an appointment to see the plastic surgeons next Monday at [facility]. Objective Constitutional Pulse regular. Respirations normal and unlabored. Afebrile. Vitals Time Taken: 10:59 AM, Height: 70 in, Weight: 200 lbs, BMI: 28.7, Pulse: 80 bpm, Respiratory Rate: 22 breaths/min, Blood Pressure: 111/45 mmHg. General Notes: Temp probe will not pick up pt temp. Eyes Nonicteric. Reactive to light. Ears, Nose, Mouth, and Throat Lips, teeth, and gums WNL.Marland Kitchen Moist mucosa without lesions . Neck supple and nontender. No palpable supraclavicular or cervical adenopathy. Normal sized without goiter. Respiratory WNL. No retractions.. Cardiovascular Pedal Pulses WNL. No clubbing, cyanosis or edema. Chest Breasts symmetical and no nipple discharge.. Breast tissue WNL, no masses, lumps, or tenderness.. Lymphatic No  adneopathy. No adenopathy. No adenopathy. Musculoskeletal Adexa without tenderness or enlargement.. Digits and nails w/o clubbing, cyanosis, infection, petechiae, ischemia, or inflammatory conditions.Marland Kitchen JASER, FULLEN ([id number]) Psychiatric Judgement and insight Intact.. No evidence of depression, anxiety, or agitation.. General Notes: The left big toe has some debris and after sharply debriding this, it is probing down to bone. The left lateral calcaneum area has some maceration and the wound itself looks fairly clean. The Achilles tendon has some debris at the inferior end and this was sharply dissected and there was significant bleeding which needed to be controlled with Silver nitrate. Integumentary (Hair, Skin) No suspicious lesions. No crepitus or fluctuance. No peri-wound warmth or erythema. No masses.. Wound #1 status is Open. Original cause of wound was Gradually Appeared. The wound is located on the Left Achilles. The wound measures 9.4cm length x 3cm width x 0.3cm depth; 22.148cm^2 area and 6.644cm^3 volume. The wound is limited to skin breakdown. There is no tunneling or undermining noted. There is a large amount of serosanguineous drainage noted. The wound margin is distinct with the outline attached to the wound base. There is large (67-100%) pink granulation within the wound bed. There is a small (1-33%) amount of necrotic tissue within the wound bed including Adherent Slough. The periwound skin appearance exhibited: Maceration, Moist. The periwound skin appearance did not exhibit: Callus, Crepitus, Excoriation, Fluctuance, Friable, Induration, Localized  Edema, Rash, Scarring, Dry/Scaly, Atrophie Blanche, Cyanosis, Ecchymosis, Hemosiderin Staining, Mottled, Pallor, Rubor, Erythema. Wound #2 status is Open. Original cause of wound was Gradually Appeared. The wound is located on the Left,Lateral Calcaneous. The wound measures 0.5cm length x 0.5cm width x 0.2cm depth; 0.196cm^2  area and 0.039cm^3 volume. The wound is limited to skin breakdown. There is no tunneling or undermining noted. The wound margin is flat and intact. There is small (1-33%) granulation within the wound bed. There is no necrotic tissue within the wound bed. The periwound skin appearance exhibited: Maceration, Moist. The periwound skin appearance did not exhibit: Callus, Crepitus, Excoriation, Fluctuance, Friable, Induration, Localized Edema, Rash, Scarring, Dry/Scaly, Atrophie Blanche, Cyanosis, Ecchymosis, Hemosiderin Staining, Mottled, Pallor, Rubor, Erythema. Periwound temperature was noted as No Abnormality. Wound #3 status is Open. Original cause of wound was Gradually Appeared. The wound is located on the Left Toe Great. The wound measures 0.5cm length x 0.5cm width x 0.2cm depth; 0.196cm^2 area and 0.039cm^3 volume. The wound is limited to skin breakdown. There is no tunneling or undermining noted. There is a none present amount of drainage noted. The wound margin is flat and intact. There is no granulation within the wound bed. There is a large (67-100%) amount of necrotic tissue within the wound bed including Eschar. The periwound skin appearance exhibited: Dry/Scaly. The periwound skin appearance did not exhibit: Callus, Crepitus, Excoriation, Fluctuance, Friable, Induration, Localized Edema, Rash, Scarring, Maceration, Moist, Atrophie Blanche, Cyanosis, Ecchymosis, Hemosiderin Staining, Mottled, Pallor, Rubor, Erythema. 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 He will see the plastic surgeons next week and see me the week after and continue with local care as before. 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 There was a Skin/Subcutaneous Tissue Debridement ([id number]) debridement with total area of 9 sq cm performed by Tailey Top, Ignacia Felling., MD. with the following instrument(s): Forceps and Scissors to remove Viable and Non-Viable tissue/material including Fibrin/Slough and Subcutaneous after achieving pain control using Lidocaine 4% Topical Solution. A time out was conducted prior to the start of the procedure. A Minimum amount of bleeding was controlled with Silver Nitrate. The procedure was tolerated well with a pain level of 0 throughout and a pain level of 0 following the procedure. Post Debridement Measurements: 9.4cm length x 3cm width x 0.3cm depth; 6.644cm^3 volume. Post procedure Diagnosis Wound #1: Same as Pre-Procedure Wound #3 Wound #3 is an Arterial Insufficiency Ulcer located on the Left Toe Great . There was a Skin/Subcutaneous Tissue Debridement ([id number]) debridement with total area of 0.25 sq cm performed by Tonja Jezewski, Ignacia Felling., MD. with the following instrument(s): Forceps and Scissors to remove Viable and Non-Viable tissue/material including Fibrin/Slough and Subcutaneous after achieving pain control using Lidocaine 4% Topical Solution. A time out was conducted prior to the start of the procedure. A Minimum amount of bleeding was controlled with Pressure. The procedure was tolerated well with a pain level of 0 throughout and a pain level of 0 following the procedure. Post Debridement Measurements: 0.5cm length x 0.5cm  width x 0.2cm depth; 0.039cm^3 volume. Post procedure Diagnosis Wound #3: Same as Pre-Procedure Plan Wound Cleansing: Wound #1 Left Achilles: Godino, Nasim L. ([id number]) Clean wound with Normal  Saline. May Shower, gently pat wound dry prior to applying new dressing. May shower with protection. Anesthetic: Wound #1 Left Achilles: Topical Lidocaine 4% cream applied to wound bed prior to debridement Wound #2 Left,Lateral Calcaneous: Topical Lidocaine 4% cream applied to wound bed prior to debridement Wound #3 Left Toe Great: Topical Lidocaine 4% cream applied to wound bed prior to debridement Skin Barriers/Peri-Wound Care: Wound #1 Left Achilles: Skin Prep Wound #2 Left,Lateral Calcaneous: Skin Prep Wound #3 Left Toe Great: Skin Prep Primary Wound Dressing: Wound #1 Left Achilles: Mepitel One Wound #2 Left,Lateral Calcaneous: Aquacel Ag Wound #3 Left Toe Great: Prisma Ag Secondary Dressing: Wound #2 Left,Lateral Calcaneous: Boardered Foam Dressing Dressing Change Frequency: Wound #1 Left Achilles: Change Dressing Monday, Wednesday, Friday Wound #2 Left,Lateral Calcaneous: Change Dressing Monday, Wednesday, Friday Wound #3 Left Toe Great: Change Dressing Monday, Wednesday, Friday Follow-up Appointments: Wound #1 Left Achilles: Return Appointment in 2 weeks. Wound #2 Left,Lateral Calcaneous: Return Appointment in 1 week. Wound #3 Left Toe Great: Return Appointment in 1 week. Home Health: Wound #1 Left Achilles: Continue Home Health Visits - [facility] - To change Wednesday, Friday. Continue Home Health Visits - [facility] - To change Wednesday, Friday. Hydrocolloid used between Achilles and lateral foot wound as barrier for moisture. Home Health Nurse may visit PRN to address patient s wound care needs. Home Health Nurse may visit PRN to address patient s wound care needs. FACE TO FACE ENCOUNTER: MEDICARE and MEDICAID PATIENTS: I certify that this patient is under my care and that I had a face-to-face encounter that meets the physician face-to-face encounter requirements with this patient on this date. The encounter with the patient was in whole or in part for  the Guillermo, Shoichi L. ([id number]) following MEDICAL CONDITION: (primary reason for Home Healthcare) MEDICAL NECESSITY: I certify, that based on my findings, NURSING services are a medically necessary home health service. HOME BOUND STATUS: I certify that my clinical findings support that this patient is homebound (i.e., Due to illness or injury, pt requires aid of supportive devices such as crutches, cane, wheelchairs, walkers, the use of special transportation or the assistance of another person to leave their place of residence. There is a normal inability to leave the home and doing so requires considerable and taxing effort. Other absences are for medical reasons / religious services and are infrequent or of short duration when for other reasons). FACE TO FACE ENCOUNTER: MEDICARE and MEDICAID PATIENTS: I certify that this patient is under my care and that I had a face-to-face encounter that meets the physician face-to-face encounter requirements with this patient on this date. The encounter with the patient was in whole or in part for the following MEDICAL CONDITION: (primary reason for Home Healthcare) MEDICAL NECESSITY: I certify, that based on my findings, NURSING services are a medically necessary home health service. HOME BOUND STATUS: I certify that my clinical findings support that this patient is homebound (i.e., Due to illness or injury, pt requires aid of supportive devices such as crutches, cane, wheelchairs, walkers, the use of special transportation or the assistance of another person to leave their place of residence. There is a normal inability to leave the home and doing so requires considerable and taxing effort. Other absences are for medical reasons / religious services and are infrequent or of short duration  when for other reasons). If current dressing causes regression in wound condition, may D/C ordered dressing product/s and apply Normal Saline Moist Dressing daily until  next Wound Healing Center / Other MD appointment. Notify Wound Healing Center of regression in wound condition at [phone]. If current dressing causes regression in wound condition, may D/C ordered dressing product/s and apply Normal Saline Moist Dressing daily until next Wound Healing Center / Other MD appointment. Notify Wound Healing Center of regression in wound condition at [phone]. Please direct any NON-WOUND related issues/requests for orders to patient's Primary Care Physician Please direct any NON-WOUND related issues/requests for orders to patient's Primary Care Physician Wound #2 Left,Lateral Calcaneous: Continue Home Health Visits - [facility] - To change Wednesday, Friday. Continue Home Health Visits - [facility] - To change Wednesday, Friday. Home Health Nurse may visit PRN to address patient s wound care needs. Home Health Nurse may visit PRN to address patient s wound care needs. FACE TO FACE ENCOUNTER: MEDICARE and MEDICAID PATIENTS: I certify that this patient is under my care and that I had a face-to-face encounter that meets the physician face-to-face encounter requirements with this patient on this date. The encounter with the patient was in whole or in part for the following MEDICAL CONDITION: (primary reason for Home Healthcare) MEDICAL NECESSITY: I certify, that based on my findings, NURSING services are a medically necessary home health service. HOME BOUND STATUS: I certify that my clinical findings support that this patient is homebound (i.e., Due to illness or injury, pt requires aid of supportive devices such as crutches, cane, wheelchairs, walkers, the use of special transportation or the assistance of another person to leave their place of residence. There is a normal inability to leave the home and doing so requires considerable and taxing effort. Other absences are for medical reasons / religious services and are infrequent or of short duration when for other  reasons). FACE TO FACE ENCOUNTER: MEDICARE and MEDICAID PATIENTS: I certify that this patient is under my care and that I had a face-to-face encounter that meets the physician face-to-face encounter requirements with this patient on this date. The encounter with the patient was in whole or in part for the following MEDICAL CONDITION: (primary reason for Home Healthcare) MEDICAL NECESSITY: I certify, that based on my findings, NURSING services are a medically necessary home health service. HOME BOUND STATUS: I certify that my clinical findings support that this patient is homebound (i.e., Due to illness or injury, pt requires aid of supportive devices such as crutches, cane, wheelchairs, walkers, the use of special transportation or the assistance of another person to leave their place of residence. There is a normal inability to leave the home and doing so requires considerable and taxing effort. Other absences are for medical reasons / religious services and are infrequent or of short duration when for other reasons). If current dressing causes regression in wound condition, may D/C ordered dressing product/s and apply Nedeau, Aaryn L. ([id number]) Normal Saline Moist Dressing daily until next Wound Healing Center / Other MD appointment. Notify Wound Healing Center of regression in wound condition at [phone]. If current dressing causes regression in wound condition, may D/C ordered dressing product/s and apply Normal Saline Moist Dressing daily until next Wound Healing Center / Other MD appointment. Notify Wound Healing Center of regression in wound condition at [phone]. Please direct any NON-WOUND related issues/requests for orders to patient's Primary Care Physician Please direct any NON-WOUND related issues/requests for orders to patient's Primary Care Physician  Wound #3 Left Toe Great: Continue Home Health Visits - [facility] - To change Wednesday, Friday. Continue Home Health Visits  - [facility] - To change Wednesday, Friday. Home Health Nurse may visit [facility] to address patient s wound care needs. Home Health Nurse may visit [facility] to address patient s wound care needs. FACE TO FACE ENCOUNTER: MEDICARE and MEDICAID PATIENTS: I certify that this patient is under my care and that I had a face-to-face encounter that meets the physician face-to-face encounter requirements with this patient on this date. The encounter with the patient was in whole or in part for the following MEDICAL CONDITION: (primary reason for Home Healthcare) MEDICAL NECESSITY: I certify, that based on my findings, NURSING services are a medically necessary home health service. HOME BOUND STATUS: I certify that my clinical findings support that this patient is homebound (i.e., Due to illness or injury, pt requires aid of supportive devices such as crutches, cane, wheelchairs, walkers, the use of special transportation or the assistance of another person to leave their place of residence. There is a normal inability to leave the home and doing so requires considerable and taxing effort. Other absences are for medical reasons / religious services and are infrequent or of short duration when for other reasons). FACE TO FACE ENCOUNTER: MEDICARE and MEDICAID PATIENTS: I certify that this patient is under my care and that I had a face-to-face encounter that meets the physician face-to-face encounter requirements with this patient on this date. The encounter with the patient was in whole or in part for the following MEDICAL CONDITION: (primary reason for Home Healthcare) MEDICAL NECESSITY: I certify, that based on my findings, NURSING services are a medically necessary home health service. HOME BOUND STATUS: I certify that my clinical findings support that this patient is homebound (i.e., Due to illness or injury, pt requires aid of supportive devices such as crutches, cane, wheelchairs, walkers, the use of special  transportation or the assistance of another person to leave their place of residence. There is a normal inability to leave the home and doing so requires considerable and taxing effort. Other absences are for medical reasons / religious services and are infrequent or of short duration when for other reasons). If current dressing causes regression in wound condition, may D/C ordered dressing product/s and apply Normal Saline Moist Dressing daily until next Wound Healing Center / Other MD appointment. Notify Wound Healing Center of regression in wound condition at [phone]. If current dressing causes regression in wound condition, may D/C ordered dressing product/s and apply Normal Saline Moist Dressing daily until next Wound Healing Center / Other MD appointment. Notify Wound Healing Center of regression in wound condition at [phone]. Please direct any NON-WOUND related issues/requests for orders to patient's Primary Care Physician Please direct any NON-WOUND related issues/requests for orders to patient's Primary Care Physician Negative Pressure Wound Therapy: Wound #1 Left Achilles: Wound VAC settings at 125/130 mmHg continuous pressure. Use BLACK/GREEN foam to wound cavity. Use WHITE foam to fill any tunnel/s and/or undermining. Change VAC dressing 3 X WEEK. Change canister as indicated when full. Nurse may titrate settings and frequency of dressing changes as clinically indicated. - appy aquacel ag to base of wound Home Health Nurse may d/c VAC for s/s of increased infection, significant wound regression, or uncontrolled drainage. Notify Wound Healing Center at [phone]. Number of foam/gauze pieces used in the dressing = - 1 Jetter, Sam L. ([id number]) We will continue local care with silver alginate under the black foam  off the wound vacuum system. He will see the plastic surgeons next week and see me the week after and continue with local care as before. Electronic  Signature(s) Signed: 09/27/2014 11:40:40 AM By: Evlyn Kanner MD, FACS Entered By: Evlyn Kanner on 09/27/2014 11:40:40 Curfman, [name] ([id number]) -------------------------------------------------------------------------------- SuperBill Details Patient Name: Dresner, Ioannis L. Date of Service: 09/27/2014 Medical Record Number: [id number] Patient Account Number: [id number] Date of Birth/Sex: [birth year] (81 y.o. 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 M86.372 Chronic multifocal osteomyelitis, left ankle and foot Facility Procedures CPT4 Code Description: [id number] 11042 - DEB SUBQ TISSUE 20 SQ CM/< ICD-10 Description Diagnosis E11.621 Type 2 diabetes mellitus with foot ulcer L97.323 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

## 2014-10-03 ENCOUNTER — Encounter (HOSPITAL_BASED_OUTPATIENT_CLINIC_OR_DEPARTMENT_OTHER): Payer: Medicare Other | Attending: Plastic Surgery

## 2014-10-03 DIAGNOSIS — E11622 Type 2 diabetes mellitus with other skin ulcer: Secondary | ICD-10-CM | POA: Insufficient documentation

## 2014-10-03 DIAGNOSIS — E1136 Type 2 diabetes mellitus with diabetic cataract: Secondary | ICD-10-CM | POA: Insufficient documentation

## 2014-10-03 DIAGNOSIS — I70245 Atherosclerosis of native arteries of left leg with ulceration of other part of foot: Secondary | ICD-10-CM | POA: Diagnosis not present

## 2014-10-03 DIAGNOSIS — E1140 Type 2 diabetes mellitus with diabetic neuropathy, unspecified: Secondary | ICD-10-CM | POA: Insufficient documentation

## 2014-10-03 DIAGNOSIS — L97521 Non-pressure chronic ulcer of other part of left foot limited to breakdown of skin: Secondary | ICD-10-CM | POA: Insufficient documentation

## 2014-10-03 DIAGNOSIS — I70244 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: Secondary | ICD-10-CM | POA: Insufficient documentation

## 2014-10-03 DIAGNOSIS — Z87891 Personal history of nicotine dependence: Secondary | ICD-10-CM | POA: Diagnosis not present

## 2014-10-03 DIAGNOSIS — Z951 Presence of aortocoronary bypass graft: Secondary | ICD-10-CM | POA: Diagnosis not present

## 2014-10-03 DIAGNOSIS — E1151 Type 2 diabetes mellitus with diabetic peripheral angiopathy without gangrene: Secondary | ICD-10-CM | POA: Insufficient documentation

## 2014-10-03 DIAGNOSIS — I2510 Atherosclerotic heart disease of native coronary artery without angina pectoris: Secondary | ICD-10-CM | POA: Insufficient documentation

## 2014-10-03 DIAGNOSIS — I252 Old myocardial infarction: Secondary | ICD-10-CM | POA: Diagnosis not present

## 2014-10-03 DIAGNOSIS — Z86718 Personal history of other venous thrombosis and embolism: Secondary | ICD-10-CM | POA: Diagnosis not present

## 2014-10-03 DIAGNOSIS — Z950 Presence of cardiac pacemaker: Secondary | ICD-10-CM | POA: Insufficient documentation

## 2014-10-03 DIAGNOSIS — L97324 Non-pressure chronic ulcer of left ankle with necrosis of bone: Secondary | ICD-10-CM | POA: Diagnosis not present

## 2014-10-03 DIAGNOSIS — I959 Hypotension, unspecified: Secondary | ICD-10-CM | POA: Diagnosis not present

## 2014-10-03 DIAGNOSIS — L97424 Non-pressure chronic ulcer of left heel and midfoot with necrosis of bone: Secondary | ICD-10-CM | POA: Diagnosis not present

## 2014-10-03 DIAGNOSIS — H9193 Unspecified hearing loss, bilateral: Secondary | ICD-10-CM | POA: Diagnosis not present

## 2014-10-03 DIAGNOSIS — I509 Heart failure, unspecified: Secondary | ICD-10-CM | POA: Insufficient documentation

## 2014-10-10 ENCOUNTER — Encounter: Payer: Medicare Other | Admitting: Surgery

## 2014-10-10 DIAGNOSIS — L97323 Non-pressure chronic ulcer of left ankle with necrosis of muscle: Secondary | ICD-10-CM | POA: Diagnosis not present

## 2014-10-10 NOTE — Progress Notes (Addendum)
[name], YO ([id number]) Visit Report for 10/10/2014 Chief Complaint Document Details Patient Name: [name], [name] Date of Service: 10/10/2014 11:30 AM Medical Record Number: [id number] Patient Account Number: [id number] Date of Birth/Sex: 07-12-1920 (81 y.o. Male) Treating RN: [name] Primary Care Physician: [name] Other Clinician: Referring Physician: Lindwood [name] Treating Physician/Extender: [name] in Treatment: 23 Information Obtained from: Patient Chief Complaint Patient presents to the wound care center for a consult due non healing wound 81 year old gentleman who comes with a history of having some ulcerated areas on his heels since February 2016 and then a large injury to his left posterior heel and ankle since about a month. Electronic Signature(s) Signed: 10/10/2014 12:13:59 PM By: [name] Kanner MD, FACS Entered By: [name] on 10/10/2014 12:13:59 Boileau, [name] ([id number]) -------------------------------------------------------------------------------- HPI Details Patient Name: [name], [name] Date of Service: 10/10/2014 11:30 AM Medical Record Number: [id number] Patient Account Number: [id number] Date of Birth/Sex: [birth year] (81 y.o. Male) Treating RN: [name] Primary Care Physician: [name] Other Clinician: Referring Physician: Lindwood [name] Treating Physician/Extender: [name] in Treatment: 27 History of Present Illness HPI Description: 81 year old gentleman who was known to be a diabetic for many years has peripheral neuropathy recently went to his primary care doctor for a punctured wound on his left heel which was something he had noticed. The patient then was referred to a podiatrist who referred him to Dr. Wyn [name] in the vascular surgery department and I understand the procedure was done on 03/14/2014 with a left lower extremity vascular procedure and stenting was done. Details of this are not available at  the present time. The patient has been applying Neosporin to his leg and has not had any wound care addressed so far. patient has also had a history of coronary artery disease in the past and hasn't had a CABG and a pacemaker placement in the remote past.Other details and notes are pending. the patient is not in pain lives alone and has some home health and other aides coming to help him with his daily chores and meals. reviewing the vascular notes I understand the procedure was done on 03/14/2014 and he was operated by Dr. Wyn [name]. he had a catheter placement to the left peroneal artery and a aortogram and selective left lower extremity angiogram was done. He also had a percutaneous transluminal angioplasty of the left peroneal artery and the tibioperoneal trunk. The distal SFA and above-knee popliteal artery were also angioplastied. A subcutaneous stent placement to the distal superficial femoral artery was also done. 05/05/2014 -- the patient has not had any change in his health and after much consideration is decided that he does not want HBOT as he is claustrophobic and was unable to tolerate being in the chamber for 90 minutes. I have discussed with him that his most recent x-ray of the foot shows that he has the distal phalanx of the left great toe showing changes with osteomyelitis cannot be excluded at that site. He tells me that he cannot have an MRI because of his defibrillator and hence we will order a triple phase bone scan. I have also reviewed his culture report which shows several organisms and he has sensitivity to tetracycline and we have recommended he takes this for 14 days and this has been given to him on 05/02/2014 05/12/2014 the bone scan done on 05/10/2014 shows #1 findings are worrisome for osteomyelitis involving the calcaneus of the left foot, #2 increase uptake localizing to the left second and  third toe on all 3 phases. Cannot rule out osteomyelitis in this area. And #3  left foot cellulitis. 05/19/2014 -- reviewed several reports which we have received back on [name]. #1 chest x-ray done on April 21 shows chronic changes in the left base no acute findings. #2 his CBC is within normal limits. #3 hemoglobin A1c was 8.5%. #4 EKG done on 26 April was within normal limits due to his electronic ventricular pacemaker. 05/26/2014 -- he has had his PICC line placed and is taking vancomycin daily basis. He is to start hyperbaric oxygen therapy on this coming Monday. 06/09/2014 -- he saw Dr. Sampson [name] yesterday and besides his IV antibiotic I believe a oral antibiotic was [name], [name]. ([id number]) also given and this may be Cipro. Patient is feeling fine otherwise does not have any symptoms though his blood pressure this morning in the wound center has been 90/50. We will check his blood pressure in the supine position. 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 Complained of worsening fatigue yesterday after HBO. Feels better [name]. Has appointment with PCP this afternoon. He wishes to hold off on HBO until next week. No significant pain. No fever or chills. Stable drainage. 06/23/2014 [name] has taken a break from HBO T and has been feeling a little better. He was seen by the nurse practitioner at his PCPs office and at that time his vitals were stable and they did get some EKG done which was within normal limits. There have sent out some lab work which we still have to receive reports. He is going to see his PCP back this afternoon and will be seeing Dr. Sampson [name] tomorrow for review. addendum: we have received labs from his PCPs office and note that his potassium is high at 5.4 and his BUN/creatinine is slightly raised. His blood sugar was 216. His total protein and albumen were 5.9 and 3 and this was low. His HandH was 10.3 and 31.4 WBC was 8.2 and his platelets were 304. CRP was 29.6 which was high. Vancomycin  trough was 19.1 which was normal TSH was 3.11 which was normal 06/30/2014 -- [name] and he feels his stomach is acting up and his not feeling up to doing hyperbaric oxygen therapy. He would like to take a break [name] and tomorrow and resume on Monday. He is on vancomycin and Levaquin as per ID and he has an appointment to see the vascular surgeons in about 10 days' time. 07/07/2014 -- [name] has been feeling weak all along and he has decided that he is not going to continue with hyperbaric oxygen therapy and a longer period. He had finished 16 of his 40 treatments and will not take anymore. He has an appointment with ID tomorrow and also his vascular surgeons and I will have a conference with both of them regarding his further care. 07/14/2014 -- over the last week I have contacted Dr. Sampson [name] and Dr. Wyn [name] and discussed his care. Dr. Wyn [name] agrees that he will take him to the OR and debrided his wound and use a wound VAC. Dr. Sampson [name] has altered his anti-biotic regimen - vancomycin has been stopped and he is on oral levofloxacin. The patient's surgery is scheduled for July 7. 07/21/2014 -- he continues to feel unwell and has a lot of GI disturbances to probably his antibiotics. He also has developed a new blister on his left fourth toe and is concerned about his upcoming surgery which  is scheduled for next Thursday. 08/04/2014 -- he was taken to the OR on 07/28/2014 and a debridement of the left heel was done of skin and soft tissue and the exposed calcaneum to remove all nonviable tissue. A wound VAC was then applied at the end of the procedure. 08/12/2014 -- he was seen by Dr. Sampson [name] [name] and he is going to continue one more week of Levaquin and then stop it and see him back in 3 weeks' time. I have reviewed his notes from [name]. 08/29/2014 -- he saw Dr. Wyn [name] his surgeon who did not have any other suggestions except for me to proceed with the skin substitute when  ready. 09/05/2014 -- he saw Dr. Sampson [name] last week and he has done some tests and is awaiting his decision regarding putting him on further antibiotics. 09/12/2014 -- he was willing to have a plastic surgery opinion after giving it careful thought. We will set him [name], [name]. ([id number]) up for this at [facility] as per his choice. 09/27/2014 -- he has an appointment to see the plastic surgeons next Monday at [facility]. 10/10/2014 -- was seen by the plastic surgeon Dr. Marzetta Board -- " I counseled him that A Cell would require procedure be done in OR, for billing purposes really as not carried in Wound Center. Also discussed clinic skin subs options. He would like to try one of the products we can place in clinic first. Will try to get auth for Theraskin or Dermagraft and VAC placement ." Electronic Signature(s) Signed: 10/10/2014 12:14:18 PM By: [name] Kanner MD, FACS Previous Signature: 10/10/2014 11:26:20 AM Version By: [name] Kanner MD, FACS Entered By: [name] on 10/10/2014 12:14:18 Exley, Jamere Elbert Ewings ([id number]) -------------------------------------------------------------------------------- Physical Exam Details Patient Name: [name], [name] Date of Service: 10/10/2014 11:30 AM Medical Record Number: [id number] Patient Account Number: [id number] Date of Birth/Sex: 26-Feb-1920 (81 y.o. Male) Treating RN: [name] Primary Care Physician: [name] Other Clinician: Referring Physician: Lindwood [name] Treating Physician/Extender: [name] in Treatment: 23 Constitutional . Pulse regular. Respirations normal and unlabored. Afebrile. . Eyes Nonicteric. Reactive to light. Ears, Nose, Mouth, and Throat Lips, teeth, and gums WNL.Marland Kitchen Moist mucosa without lesions . Neck supple and nontender. No palpable supraclavicular or cervical adenopathy. Normal sized without goiter. Respiratory WNL. No retractions.. Cardiovascular Pedal Pulses WNL. No clubbing, cyanosis or  edema. Lymphatic No adneopathy. No adenopathy. No adenopathy. Musculoskeletal Adexa without tenderness or enlargement.. Digits and nails w/o clubbing, cyanosis, infection, petechiae, ischemia, or inflammatory conditions.. Integumentary (Hair, Skin) No suspicious lesions. No crepitus or fluctuance. No peri-wound warmth or erythema. No masses.Marland Kitchen Psychiatric Judgement and insight Intact.. No evidence of depression, anxiety, or agitation.. Notes The big toe has almost completely dried out and the lateral calcaneal in part on the left foot is looking good. The wound on the posterior part of the Achilles tendon has looked the best since we started with him and except for a minimal slough and bone protruding at the most inferior part of the wound it all looks very good. Electronic Signature(s) Signed: 10/10/2014 12:15:15 PM By: [name] Kanner MD, FACS Entered By: [name] on 10/10/2014 12:15:14 Bolon, [name] ([id number]) -------------------------------------------------------------------------------- Physician Orders Details Patient Name: [name], [name] Date of Service: 10/10/2014 11:30 AM Medical Record Number: [id number] Patient Account Number: [id number] Date of Birth/Sex: [birth year] (81 y.o. Male) Treating RN: [name] Primary Care Physician: [name] Other Clinician: Referring Physician: Lindwood [name] Treating Physician/Extender: [name] in Treatment: 83 Verbal / Phone Orders: Yes Clinician:  Huel [name] Read Back and Verified: Yes Diagnosis Coding Wound Cleansing Wound #1 Left Achilles o Clean wound with Normal Saline. o May Shower, gently pat wound dry prior to applying new dressing. o May shower with protection. Wound #2 Left,Lateral Calcaneous o Clean wound with Normal Saline. o May Shower, gently pat wound dry prior to applying new dressing. o May shower with protection. Wound #3 Left Toe Great o Clean wound with Normal Saline. o  May Shower, gently pat wound dry prior to applying new dressing. o May shower with protection. Anesthetic Wound #1 Left Achilles o Topical Lidocaine 4% cream applied to wound bed prior to debridement Wound #2 Left,Lateral Calcaneous o Topical Lidocaine 4% cream applied to wound bed prior to debridement Wound #3 Left Toe Great o Topical Lidocaine 4% cream applied to wound bed prior to debridement o Topical Lidocaine 4% cream applied to wound bed prior to debridement Skin Barriers/Peri-Wound Care Wound #1 Left Achilles o Skin Prep Wound #2 Left,Lateral Calcaneous o Skin Prep Wound #3 Left Toe Great o Skin Prep [name], [name] ([id number]) Primary Wound Dressing Wound #1 Left Achilles o Mepitel One Wound #2 Left,Lateral Calcaneous o Aquacel Ag Wound #3 Left Toe Great o Prisma Ag Secondary Dressing Wound #2 Left,Lateral Calcaneous o Boardered Foam Dressing Dressing Change Frequency Wound #1 Left Achilles o Change Dressing Monday, Wednesday, Friday Wound #2 Left,Lateral Calcaneous o Change Dressing Monday, Wednesday, Friday Wound #3 Left Toe Great o Change Dressing Monday, Wednesday, Friday Follow-up Appointments Wound #1 Left Achilles o Return Appointment in 2 weeks. Wound #2 Left,Lateral Calcaneous o Return Appointment in 1 week. Wound #3 Left Toe Great o Return Appointment in 1 week. Home Health Wound #1 Left Achilles o Continue Home Health Visits - [place] - To change Wednesday, Friday. o Continue Home Health Visits - [place] - To change Wednesday, Friday. Hydrocolloid used between Achilles and lateral foot wound as barrier for moisture. o Home Health Nurse may visit PRN to address patientos wound care needs. o Home Health Nurse may visit PRN to address patientos wound care needs. o FACE TO FACE ENCOUNTER: MEDICARE and MEDICAID PATIENTS: I certify that this patient is under my care and that I had a face-to-face encounter  that meets the physician face-to-face encounter requirements with this patient on this date. The encounter with the patient was in whole or in part for the following MEDICAL CONDITION: (primary reason for Home Healthcare) MEDICAL NECESSITY: I certify, that based on my findings, NURSING services are a medically necessary home health service. HOME BOUND STATUS: I certify that my clinical findings [name], [name]. ([id number]) support that this patient is homebound (i.e., Due to illness or injury, pt requires aid of supportive devices such as crutches, cane, wheelchairs, walkers, the use of special transportation or the assistance of another person to leave their place of residence. There is a normal inability to leave the home and doing so requires considerable and taxing effort. Other absences are for medical reasons / religious services and are infrequent or of short duration when for other reasons). o FACE TO FACE ENCOUNTER: MEDICARE and MEDICAID PATIENTS: I certify that this patient is under my care and that I had a face-to-face encounter that meets the physician face-to-face encounter requirements with this patient on this date. The encounter with the patient was in whole or in part for the following MEDICAL CONDITION: (primary reason for Home Healthcare) MEDICAL NECESSITY: I certify, that based on my findings, NURSING services are a medically necessary home health  service. HOME BOUND STATUS: I certify that my clinical findings support that this patient is homebound (i.e., Due to illness or injury, pt requires aid of supportive devices such as crutches, cane, wheelchairs, walkers, the use of special transportation or the assistance of another person to leave their place of residence. There is a normal inability to leave the home and doing so requires considerable and taxing effort. Other absences are for medical reasons / religious services and are infrequent or of short duration when for  other reasons). o If current dressing causes regression in wound condition, may D/C ordered dressing product/s and apply Normal Saline Moist Dressing daily until next Wound Healing Center / Other MD appointment. Notify Wound Healing Center of regression in wound condition at [phone]. o If current dressing causes regression in wound condition, may D/C ordered dressing product/s and apply Normal Saline Moist Dressing daily until next Wound Healing Center / Other MD appointment. Notify Wound Healing Center of regression in wound condition at [phone]. o Please direct any NON-WOUND related issues/requests for orders to patient's Primary Care Physician o Please direct any NON-WOUND related issues/requests for orders to patient's Primary Care Physician Wound #2 Left,Lateral Calcaneous o Continue Home Health Visits - [place] - To change Wednesday, Friday. o Continue Home Health Visits - [place] - To change Wednesday, Friday. o Home Health Nurse may visit PRN to address patientos wound care needs. o Home Health Nurse may visit PRN to address patientos wound care needs. o FACE TO FACE ENCOUNTER: MEDICARE and MEDICAID PATIENTS: I certify that this patient is under my care and that I had a face-to-face encounter that meets the physician face-to-face encounter requirements with this patient on this date. The encounter with the patient was in whole or in part for the following MEDICAL CONDITION: (primary reason for Home Healthcare) MEDICAL NECESSITY: I certify, that based on my findings, NURSING services are a medically necessary home health service. HOME BOUND STATUS: I certify that my clinical findings support that this patient is homebound (i.e., Due to illness or injury, pt requires aid of supportive devices such as crutches, cane, wheelchairs, walkers, the use of special transportation or the assistance of another person to leave their place of residence. There is a normal  inability to leave the home and doing so requires considerable and taxing effort. Other absences are for medical reasons / religious services and are infrequent or of short duration when for other reasons). o FACE TO FACE ENCOUNTER: MEDICARE and MEDICAID PATIENTS: I certify that this patient is under my care and that I had a face-to-face encounter that meets the physician face-to-face encounter requirements with this patient on this date. The encounter with the patient was in whole or in part for the following MEDICAL CONDITION: (primary reason for Home Healthcare) KONG, PACKETT ([id number]) MEDICAL NECESSITY: I certify, that based on my findings, NURSING services are a medically necessary home health service. HOME BOUND STATUS: I certify that my clinical findings support that this patient is homebound (i.e., Due to illness or injury, pt requires aid of supportive devices such as crutches, cane, wheelchairs, walkers, the use of special transportation or the assistance of another person to leave their place of residence. There is a normal inability to leave the home and doing so requires considerable and taxing effort. Other absences are for medical reasons / religious services and are infrequent or of short duration when for other reasons). o If current dressing causes regression in wound condition, may D/C ordered dressing product/s  and apply Normal Saline Moist Dressing daily until next Wound Healing Center / Other MD appointment. Notify Wound Healing Center of regression in wound condition at [phone]. o If current dressing causes regression in wound condition, may D/C ordered dressing product/s and apply Normal Saline Moist Dressing daily until next Wound Healing Center / Other MD appointment. Notify Wound Healing Center of regression in wound condition at [phone]. o Please direct any NON-WOUND related issues/requests for orders to patient's Primary Care Physician o  Please direct any NON-WOUND related issues/requests for orders to patient's Primary Care Physician Wound #3 Left Toe Great o Continue Home Health Visits - [place] - To change Wednesday, Friday. o Continue Home Health Visits - [place] - To change Wednesday, Friday. o Home Health Nurse may visit PRN to address patientos wound care needs. o Home Health Nurse may visit PRN to address patientos wound care needs. o FACE TO FACE ENCOUNTER: MEDICARE and MEDICAID PATIENTS: I certify that this patient is under my care and that I had a face-to-face encounter that meets the physician face-to-face encounter requirements with this patient on this date. The encounter with the patient was in whole or in part for the following MEDICAL CONDITION: (primary reason for Home Healthcare) MEDICAL NECESSITY: I certify, that based on my findings, NURSING services are a medically necessary home health service. HOME BOUND STATUS: I certify that my clinical findings support that this patient is homebound (i.e., Due to illness or injury, pt requires aid of supportive devices such as crutches, cane, wheelchairs, walkers, the use of special transportation or the assistance of another person to leave their place of residence. There is a normal inability to leave the home and doing so requires considerable and taxing effort. Other absences are for medical reasons / religious services and are infrequent or of short duration when for other reasons). o FACE TO FACE ENCOUNTER: MEDICARE and MEDICAID PATIENTS: I certify that this patient is under my care and that I had a face-to-face encounter that meets the physician face-to-face encounter requirements with this patient on this date. The encounter with the patient was in whole or in part for the following MEDICAL CONDITION: (primary reason for Home Healthcare) MEDICAL NECESSITY: I certify, that based on my findings, NURSING services are a medically necessary home  health service. HOME BOUND STATUS: I certify that my clinical findings support that this patient is homebound (i.e., Due to illness or injury, pt requires aid of supportive devices such as crutches, cane, wheelchairs, walkers, the use of special transportation or the assistance of another person to leave their place of residence. There is a normal inability to leave the home and doing so requires considerable and taxing effort. Other absences are for medical reasons / religious services and are infrequent or of short duration when for other reasons). Mcgranahan, Hiawatha L. ([id number]) o If current dressing causes regression in wound condition, may D/C ordered dressing product/s and apply Normal Saline Moist Dressing daily until next Wound Healing Center / Other MD appointment. Notify Wound Healing Center of regression in wound condition at [phone]. o If current dressing causes regression in wound condition, may D/C ordered dressing product/s and apply Normal Saline Moist Dressing daily until next Wound Healing Center / Other MD appointment. Notify Wound Healing Center of regression in wound condition at [phone]. o Please direct any NON-WOUND related issues/requests for orders to patient's Primary Care Physician o Please direct any NON-WOUND related issues/requests for orders to patient's Primary Care Physician Negative Pressure Wound Therapy Wound #  1 Left Achilles o Wound VAC settings at 125/130 mmHg continuous pressure. Use BLACK/GREEN foam to wound cavity. Use WHITE foam to fill any tunnel/s and/or undermining. Change VAC dressing 3 X WEEK. Change canister as indicated when full. Nurse may titrate settings and frequency of dressing changes as clinically indicated. - appy aquacel ag to base of wound o Home Health Nurse may d/c VAC for s/s of increased infection, significant wound regression, or uncontrolled drainage. Notify Wound Healing Center at [phone]. o Number  of foam/gauze pieces used in the dressing = - 1 Electronic Signature(s) Signed: 10/10/2014 3:58:37 PM By: [name] Kanner MD, FACS Signed: 10/10/2014 4:49:24 PM By: Elliot Gurney RN, BSN, Kim RN, BSN Entered By: Elliot Gurney, RN, BSN, Kim on 10/10/2014 12:11:12 LYDELL, MOGA ([id number]) -------------------------------------------------------------------------------- Prescription 10/10/2014 Patient Name: Verma, Macrae L. Physician: [name] Kanner MD Date of Birth: August 15, 1920 NPI#: [id number] Sex: Judie Petit DEA#: [id number] Phone #: [phone] License #: Patient Address: [facility] [address] [place], [place] [address] [facility] [address] [place], [place] [address] [phone] Allergies ACE Inhibitors Reaction: chest pain Severity: Severe Physician's Orders Wound VAC settings at 125/130 mmHg continuous pressure. Use BLACK/GREEN foam to wound cavity. Use WHITE foam to fill any tunnel/s and/or undermining. Change VAC dressing 3 X WEEK. Change canister as indicated when full. Nurse may titrate settings and frequency of dressing changes as clinically indicated. - appy aquacel ag to base of wound Signature(s): Date(s): Electronic Signature(s) Signed: 10/10/2014 3:58:37 PM By: [name] Kanner MD, FACS Signed: 10/10/2014 4:49:24 PM By: Elliot Gurney RN, BSN, Kim RN, BSN Entered By: Elliot Gurney, RN, BSN, Kim on 10/10/2014 12:11:13 Corl, [name] ([id number]) --------------------------------------------------------------------------------  Problem List Details Patient Name: [name], [name] Date of Service: 10/10/2014 11:30 AM Medical Record Number: [id number] Patient Account Number: [id number] Date of Birth/Sex: [birth year] (81 y.o. 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 Date of Service: 10/10/2014 11:30 AM Medical Record Number: [id number] Patient Account Number: [id number] Date of Birth/Sex: 1920/11/22 (81 y.o. Male) Treating RN: [name] Primary Care Physician: [name] Other Clinician: Referring Physician: Lindwood [name] Treating Physician/Extender: [name] in Treatment: 23 Subjective Chief Complaint Information obtained from Patient Patient presents to the wound care center for a consult due non healing wound 81 year old gentleman who comes with a history of having some ulcerated areas on his heels since February 2016 and then a large injury to his left posterior heel and ankle since about a month. History of Present Illness (HPI) 81 year old gentleman who was known to be a diabetic for many years has peripheral neuropathy recently went to his primary care doctor for a punctured wound on his left heel which was something he had noticed. The patient then was referred to a podiatrist who  referred him to Dr. Wyn [name] in the vascular surgery department and I understand the procedure was done on 03/14/2014 with a left lower  extremity vascular procedure and stenting was done. Details of this are not available at the present time. The patient has been applying Neosporin to his leg and has not had any wound care addressed so far. patient has also had a history of coronary artery disease in the past and hasn't had a CABG and a pacemaker placement in the remote past.Other details and notes are pending. the patient is not in pain lives alone and has some home health and other aides coming to help him with his daily chores and meals. reviewing the vascular notes I understand the procedure was done on 03/14/2014 and he was operated by Dr. Wyn [name]. he had a catheter placement to the left peroneal artery and a aortogram and selective left lower extremity angiogram was done. He also had a percutaneous transluminal angioplasty of the left peroneal artery and the tibioperoneal trunk. The distal SFA and above-knee popliteal artery were also angioplastied. A subcutaneous stent placement to the distal superficial femoral artery was also done. 05/05/2014 -- the patient has not had any change in his health and after much consideration is decided that he does not want HBOT as he is claustrophobic and was unable to tolerate being in the chamber for 90 minutes. I have discussed with him that his most recent x-ray of the foot shows that he has the distal phalanx of the left great toe showing changes with osteomyelitis cannot be excluded at that site. He tells me that he cannot have an MRI because of his defibrillator and hence we will order a triple phase bone scan. I have also reviewed his culture report which shows several organisms and he has sensitivity to tetracycline and we have recommended he takes this for 14 days and this has been given to him on 05/02/2014 05/12/2014 the bone scan done on 05/10/2014 shows #1 findings are worrisome for osteomyelitis involving the calcaneus of the left foot, #2 increase uptake localizing to the left  second and third toe on all 3 phases. Cannot rule out osteomyelitis in this area. And #3 left foot cellulitis. 05/19/2014 -- reviewed several reports which we have received back on [name]. #1 chest x-ray done on April 21 shows chronic changes in the left base no acute findings. [name], [name] ([id number]) #2 his CBC is within normal limits. #3 hemoglobin A1c was 8.5%. #4 EKG done on 26 April was within normal limits due to his electronic ventricular pacemaker. 05/26/2014 -- he has had his PICC line placed and is taking vancomycin daily basis. He is to start hyperbaric oxygen therapy on this coming Monday. 06/09/2014 -- he saw Dr. Sampson [name] yesterday and besides his IV antibiotic I believe a oral antibiotic was also given and this may be Cipro. Patient is feeling fine otherwise does not have any symptoms though his blood pressure this morning in the wound center has been 90/50. We will check his blood pressure in the supine position. 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 Complained of worsening fatigue yesterday after HBO. Feels better [name]. Has appointment with PCP this afternoon. He wishes to hold off on HBO until next week. No significant pain. No fever or chills. Stable drainage. 06/23/2014 Schneider has taken a break from HBO T and has been feeling a little better. He was seen by the nurse practitioner at his PCPs office  and at that time his vitals were stable and they did get some EKG done which was within normal limits. There have sent out some lab work which we still have to receive reports. He is going to see his PCP back this afternoon and will be seeing Dr. Sampson [name] tomorrow for review. addendum: we have received labs from his PCPs office and note that his potassium is high at 5.4 and his BUN/creatinine is slightly raised. His blood sugar was 216. His total protein and albumen were 5.9 and 3 and this was low. His HandH was 10.3 and  31.4 WBC was 8.2 and his platelets were 304. CRP was 29.6 which was high. Vancomycin trough was 19.1 which was normal TSH was 3.11 which was normal 06/30/2014 -- [name] and he feels his stomach is acting up and his not feeling up to doing hyperbaric oxygen therapy. He would like to take a break [name] and tomorrow and resume on Monday. He is on vancomycin and Levaquin as per ID and he has an appointment to see the vascular surgeons in about 10 days' time. 07/07/2014 -- [name] has been feeling weak all along and he has decided that he is not going to continue with hyperbaric oxygen therapy and a longer period. He had finished 16 of his 40 treatments and will not take anymore. He has an appointment with ID tomorrow and also his vascular surgeons and I will have a conference with both of them regarding his further care. 07/14/2014 -- over the last week I have contacted Dr. Sampson [name] and Dr. Wyn [name] and discussed his care. Dr. Wyn [name] agrees that he will take him to the OR and debrided his wound and use a wound VAC. Dr. Sampson [name] has altered his anti-biotic regimen - vancomycin has been stopped and he is on oral levofloxacin. The patient's surgery is scheduled for July 7. 07/21/2014 -- he continues to feel unwell and has a lot of GI disturbances to probably his antibiotics. He also has developed a new blister on his left fourth toe and is concerned about his upcoming surgery which is scheduled for next Thursday. 08/04/2014 -- he was taken to the OR on 07/28/2014 and a debridement of the left heel was done of skin and soft tissue and the exposed calcaneum to remove all nonviable tissue. A wound VAC was then applied at the end of the procedure. ENDI, LAGMAN ([id number]) 08/12/2014 -- he was seen by Dr. Sampson [name] [name] and he is going to continue one more week of Levaquin and then stop it and see him back in 3 weeks' time. I have reviewed his notes from [name]. 08/29/2014 -- he saw Dr.  Wyn [name] his surgeon who did not have any other suggestions except for me to proceed with the skin substitute when ready. 09/05/2014 -- he saw Dr. Sampson [name] last week and he has done some tests and is awaiting his decision regarding putting him on further antibiotics. 09/12/2014 -- he was willing to have a plastic surgery opinion after giving it careful thought. We will set him up for this at [facility] as per his choice. 09/27/2014 -- he has an appointment to see the plastic surgeons next Monday at [facility]. 10/10/2014 -- was seen by the plastic surgeon Dr. Marzetta Board -- " I counseled him that A Cell would require procedure be done in OR, for billing purposes really as not carried in Wound Center. Also discussed clinic skin subs options. He would like to try one of the products we  can place in clinic first. Will try to get auth for Theraskin or Dermagraft and VAC placement ." Dr. Sampson [name] saw him recently on Friday and got a CBC done and I stopped the Levaquin. Objective Constitutional Pulse regular. Respirations normal and unlabored. Afebrile. Vitals Time Taken: 11:37 AM, Height: 70 in, Weight: 200 lbs, BMI: 28.7, Temperature: 97.5 F, Pulse: 72 bpm, Respiratory Rate: 24 breaths/min, Blood Pressure: 114/42 mmHg. Eyes Nonicteric. Reactive to light. Ears, Nose, Mouth, and Throat Lips, teeth, and gums WNL.Marland Kitchen Moist mucosa without lesions . Neck supple and nontender. No palpable supraclavicular or cervical adenopathy. Normal sized without goiter. Respiratory WNL. No retractions.. Cardiovascular Pedal Pulses WNL. No clubbing, cyanosis or edema. Throgmorton, Browning L. ([id number]) Lymphatic No adneopathy. No adenopathy. No adenopathy. Musculoskeletal Adexa without tenderness or enlargement.. Digits and nails w/o clubbing, cyanosis, infection, petechiae, ischemia, or inflammatory conditions.Marland Kitchen Psychiatric Judgement and insight Intact.. No evidence of depression, anxiety, or agitation.. General  Notes: The big toe has almost completely dried out and the lateral calcaneal in part on the left foot is looking good. The wound on the posterior part of the Achilles tendon has looked the best since we started with him and except for a minimal slough and bone protruding at the most inferior part of the wound it all looks very good. Integumentary (Hair, Skin) No suspicious lesions. No crepitus or fluctuance. No peri-wound warmth or erythema. No masses.. Wound #1 status is Open. Original cause of wound was Gradually Appeared. The wound is located on the Left Achilles. The wound measures 7.3cm length x 4.3cm width x 0.5cm depth; 24.654cm^2 area and 12.327cm^3 volume. The wound is limited to skin breakdown. There is undermining starting at 4:00 and ending at 5:00 with a maximum distance of 0.4cm. There is a large amount of serosanguineous drainage noted. The wound margin is distinct with the outline attached to the wound base. There is large (67-100%) red, pink granulation within the wound bed. There is a small (1-33%) amount of necrotic tissue within the wound bed including Adherent Slough. The periwound skin appearance exhibited: Maceration, Moist, Erythema. The periwound skin appearance did not exhibit: Callus, Crepitus, Excoriation, Fluctuance, Friable, Induration, Localized Edema, Rash, Scarring, Dry/Scaly, Atrophie Blanche, Cyanosis, Ecchymosis, Hemosiderin Staining, Mottled, Pallor, Rubor. The surrounding wound skin color is noted with erythema. Periwound temperature was noted as No Abnormality. Wound #2 status is Open. Original cause of wound was Gradually Appeared. The wound is located on the Left,Lateral Calcaneous. The wound measures 0.3cm length x 0.3cm width x 0.2cm depth; 0.071cm^2 area and 0.014cm^3 volume. The wound is limited to skin breakdown. There is no tunneling or undermining noted. There is a small amount of drainage noted. The wound margin is flat and intact. There is  no granulation within the wound bed. There is a small (1-33%) amount of necrotic tissue within the wound bed. The periwound skin appearance exhibited: Maceration, Moist, Erythema. The periwound skin appearance did not exhibit: Callus, Crepitus, Excoriation, Fluctuance, Friable, Induration, Localized Edema, Rash, Scarring, Dry/Scaly, Atrophie Blanche, Cyanosis, Ecchymosis, Hemosiderin Staining, Mottled, Pallor, Rubor. The surrounding wound skin color is noted with erythema. Periwound temperature was noted as No Abnormality. Wound #3 status is Open. Original cause of wound was Gradually Appeared. The wound is located on the Left Toe Great. The wound measures 0.2cm length x 0.3cm width x 0.3cm depth; 0.047cm^2 area and 0.014cm^3 volume. The wound is limited to skin breakdown. There is a none present amount of drainage noted. The wound margin is flat and intact. There is  no granulation within the wound bed. There is a large (67-100%) amount of necrotic tissue within the wound bed including Eschar. The periwound skin appearance exhibited: Callus, Dry/Scaly. The periwound skin appearance did not exhibit: Crepitus, Excoriation, Fluctuance, Friable, Induration, Localized Edema, Rash, Scarring, Maceration, Moist, Atrophie Blanche, Cyanosis, Ecchymosis, Hemosiderin Staining, Mottled, Pallor, Rubor, Erythema. Periwound Herling, Kairav L. ([id number]) temperature was noted as No Abnormality. 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 Marzetta Board at [facility] next Monday. I have spoken to the patient and the son and I would  be happy for them to continue to see her for the skin substitute and follow-up as needed. We will welcome him back if at any stage he needs to see [place] here, but if not we can transition his care to the plastic surgeon. this week we will continue with application of the wound VAC as before and Prisma Ag the other places. Plan Wound Cleansing: Wound #1 Left Achilles: Clean wound with Normal Saline. May Shower, gently pat wound dry prior to applying new dressing. May shower with protection. Wound #2 Left,Lateral Calcaneous: Clean wound with Normal Saline. May Shower, gently pat wound dry prior to applying new dressing. May shower with protection. Wound #3 Left Toe Great: Clean wound with Normal Saline. May Shower, gently pat wound dry prior to applying new dressing. May shower with protection. Anesthetic: Wound #1 Left Achilles: Topical Lidocaine 4% cream applied to wound bed prior to debridement Wound #2 Left,Lateral Calcaneous: Topical Lidocaine 4% cream applied to wound bed prior to debridement Wound #3 Left Toe GreatNICANOR, MENDOLIA ([id number]) Topical Lidocaine 4% cream applied to wound bed prior to debridement Topical Lidocaine 4% cream applied to wound bed prior to debridement Skin Barriers/Peri-Wound Care: Wound #1 Left Achilles: Skin Prep Wound #2 Left,Lateral Calcaneous: Skin Prep Wound #3 Left Toe Great: Skin Prep Primary Wound Dressing: Wound #1 Left Achilles: Mepitel One Wound #2 Left,Lateral Calcaneous: Aquacel Ag Wound #3 Left Toe Great: Prisma Ag Secondary Dressing: Wound #2 Left,Lateral Calcaneous: Boardered Foam Dressing Dressing Change Frequency: Wound #1 Left Achilles: Change Dressing Monday, Wednesday, Friday Wound #2 Left,Lateral Calcaneous: Change Dressing Monday, Wednesday, Friday Wound #3 Left Toe Great: Change Dressing Monday, Wednesday, Friday Follow-up Appointments: Wound #1 Left Achilles: Return Appointment in 2 weeks. Wound #2  Left,Lateral Calcaneous: Return Appointment in 1 week. Wound #3 Left Toe Great: Return Appointment in 1 week. Home Health: Wound #1 Left Achilles: Continue Home Health Visits - [facility] - To change Wednesday, Friday. Continue Home Health Visits - [facility] - To change Wednesday, Friday. Hydrocolloid used between Achilles and lateral foot wound as barrier for moisture. Home Health Nurse may visit PRN to address patient s wound care needs. Home Health Nurse may visit PRN to address patient s wound care needs. FACE TO FACE ENCOUNTER: MEDICARE and MEDICAID PATIENTS: I certify that this patient is under my care and that I had a face-to-face encounter that meets the physician face-to-face encounter requirements with this patient on this date. The encounter with the patient was in whole or in part for the following MEDICAL CONDITION: (primary reason for Home Healthcare) MEDICAL NECESSITY: I certify, that based on  my findings, NURSING services are a medically necessary home health service. HOME BOUND STATUS: I certify that my clinical findings support that this patient is homebound (i.e., Due to illness or injury, pt requires aid of supportive devices such as crutches, cane, wheelchairs, walkers, the use of special transportation or the assistance of another person to leave their place of residence. There is a normal inability to leave the home and doing so requires considerable and taxing effort. Other absences are for medical reasons / religious services and are infrequent or of short duration when for other reasons). FACE TO FACE ENCOUNTER: MEDICARE and MEDICAID PATIENTS: I certify that this patient is under Holan, Adell L. ([id number]) my care and that I had a face-to-face encounter that meets the physician face-to-face encounter requirements with this patient on this date. The encounter with the patient was in whole or in part for the following MEDICAL CONDITION: (primary reason for Home  Healthcare) MEDICAL NECESSITY: I certify, that based on my findings, NURSING services are a medically necessary home health service. HOME BOUND STATUS: I certify that my clinical findings support that this patient is homebound (i.e., Due to illness or injury, pt requires aid of supportive devices such as crutches, cane, wheelchairs, walkers, the use of special transportation or the assistance of another person to leave their place of residence. There is a normal inability to leave the home and doing so requires considerable and taxing effort. Other absences are for medical reasons / religious services and are infrequent or of short duration when for other reasons). If current dressing causes regression in wound condition, may D/C ordered dressing product/s and apply Normal Saline Moist Dressing daily until next Wound Healing Center / Other MD appointment. Notify Wound Healing Center of regression in wound condition at [phone]. If current dressing causes regression in wound condition, may D/C ordered dressing product/s and apply Normal Saline Moist Dressing daily until next Wound Healing Center / Other MD appointment. Notify Wound Healing Center of regression in wound condition at [phone]. Please direct any NON-WOUND related issues/requests for orders to patient's Primary Care Physician Please direct any NON-WOUND related issues/requests for orders to patient's Primary Care Physician Wound #2 Left,Lateral Calcaneous: Continue Home Health Visits - [facility] - To change Wednesday, Friday. Continue Home Health Visits - [facility] - To change Wednesday, Friday. Home Health Nurse may visit PRN to address patient s wound care needs. Home Health Nurse may visit PRN to address patient s wound care needs. FACE TO FACE ENCOUNTER: MEDICARE and MEDICAID PATIENTS: I certify that this patient is under my care and that I had a face-to-face encounter that meets the physician face-to-face  encounter requirements with this patient on this date. The encounter with the patient was in whole or in part for the following MEDICAL CONDITION: (primary reason for Home Healthcare) MEDICAL NECESSITY: I certify, that based on my findings, NURSING services are a medically necessary home health service. HOME BOUND STATUS: I certify that my clinical findings support that this patient is homebound (i.e., Due to illness or injury, pt requires aid of supportive devices such as crutches, cane, wheelchairs, walkers, the use of special transportation or the assistance of another person to leave their place of residence. There is a normal inability to leave the home and doing so requires considerable and taxing effort. Other absences are for medical reasons / religious services and are infrequent or of short duration when for other reasons). FACE TO FACE ENCOUNTER: MEDICARE and MEDICAID PATIENTS: I certify that this  patient is under my care and that I had a face-to-face encounter that meets the physician face-to-face encounter requirements with this patient on this date. The encounter with the patient was in whole or in part for the following MEDICAL CONDITION: (primary reason for Home Healthcare) MEDICAL NECESSITY: I certify, that based on my findings, NURSING services are a medically necessary home health service. HOME BOUND STATUS: I certify that my clinical findings support that this patient is homebound (i.e., Due to illness or injury, pt requires aid of supportive devices such as crutches, cane, wheelchairs, walkers, the use of special transportation or the assistance of another person to leave their place of residence. There is a normal inability to leave the home and doing so requires considerable and taxing effort. Other absences are for medical reasons / religious services and are infrequent or of short duration when for other reasons). If current dressing causes regression in wound condition,  may D/C ordered dressing product/s and apply Normal Saline Moist Dressing daily until next Wound Healing Center / Other MD appointment. Notify Wound Healing Center of regression in wound condition at [phone]. If current dressing causes regression in wound condition, may D/C ordered dressing product/s and apply Normal Saline Moist Dressing daily until next Wound Healing Center / Other MD appointment. Notify Wound Healing Center of regression in wound condition at [phone]. Please direct any NON-WOUND related issues/requests for orders to patient's Primary Care Physician Please direct any NON-WOUND related issues/requests for orders to patient's Primary Care Physician Wound #3 Left Toe GreatBELL, CAI ([id number]) Continue Home Health Visits - [facility] - To change Wednesday, Friday. Continue Home Health Visits - [facility] - To change Wednesday, Friday. Home Health Nurse may visit PRN to address patient s wound care needs. Home Health Nurse may visit PRN to address patient s wound care needs. FACE TO FACE ENCOUNTER: MEDICARE and MEDICAID PATIENTS: I certify that this patient is under my care and that I had a face-to-face encounter that meets the physician face-to-face encounter requirements with this patient on this date. The encounter with the patient was in whole or in part for the following MEDICAL CONDITION: (primary reason for Home Healthcare) MEDICAL NECESSITY: I certify, that based on my findings, NURSING services are a medically necessary home health service. HOME BOUND STATUS: I certify that my clinical findings support that this patient is homebound (i.e., Due to illness or injury, pt requires aid of supportive devices such as crutches, cane, wheelchairs, walkers, the use of special transportation or the assistance of another person to leave their place of residence. There is a normal inability to leave the home and doing so requires considerable and taxing effort. Other  absences are for medical reasons / religious services and are infrequent or of short duration when for other reasons). FACE TO FACE ENCOUNTER: MEDICARE and MEDICAID PATIENTS: I certify that this patient is under my care and that I had a face-to-face encounter that meets the physician face-to-face encounter requirements with this patient on this date. The encounter with the patient was in whole or in part for the following MEDICAL CONDITION: (primary reason for Home Healthcare) MEDICAL NECESSITY: I certify, that based on my findings, NURSING services are a medically necessary home health service. HOME BOUND STATUS: I certify that my clinical findings support that this patient is homebound (i.e., Due to illness or injury, pt requires aid of supportive devices such as crutches, cane, wheelchairs, walkers, the use of special transportation or the assistance of another person to  leave their place of residence. There is a normal inability to leave the home and doing so requires considerable and taxing effort. Other absences are for medical reasons / religious services and are infrequent or of short duration when for other reasons). If current dressing causes regression in wound condition, may D/C ordered dressing product/s and apply Normal Saline Moist Dressing daily until next Wound Healing Center / Other MD appointment. Notify Wound Healing Center of regression in wound condition at [phone]. If current dressing causes regression in wound condition, may D/C ordered dressing product/s and apply Normal Saline Moist Dressing daily until next Wound Healing Center / Other MD appointment. Notify Wound Healing Center of regression in wound condition at [phone]. Please direct any NON-WOUND related issues/requests for orders to patient's Primary Care Physician Please direct any NON-WOUND related issues/requests for orders to patient's Primary Care Physician Negative Pressure Wound Therapy: Wound #1  Left Achilles: Wound VAC settings at 125/130 mmHg continuous pressure. Use BLACK/GREEN foam to wound cavity. Use WHITE foam to fill any tunnel/s and/or undermining. Change VAC dressing 3 X WEEK. Change canister as indicated when full. Nurse may titrate settings and frequency of dressing changes as clinically indicated. - appy aquacel ag to base of wound Home Health Nurse may d/c VAC for s/s of increased infection, significant wound regression, or uncontrolled drainage. Notify Wound Healing Center at [phone]. Number of foam/gauze pieces used in the dressing = - 1 The patient is going to see Dr. Marzetta Board at [facility] next Monday. I have spoken to the patient and the son and I would be happy for them to continue to see her for the skin substitute and follow-up as needed. NERO, SAWATZKY ([id number]) We will welcome him back if at any stage he needs to see [place] here, but if not we can transition his care to the plastic surgeon. this week we will continue with application of the wound VAC as before and Prisma Ag the other places. Electronic Signature(s) Signed: 10/10/2014 4:00:48 PM By: [name] Kanner MD, FACS Previous Signature: 10/10/2014 4:00:27 PM Version By: [name] Kanner MD, FACS Previous Signature: 10/10/2014 12:17:46 PM Version By: [name] Kanner MD, FACS Entered By: [name] on 10/10/2014 16:00:47 Laskaris, [name] ([id number]) -------------------------------------------------------------------------------- SuperBill Details Patient Name: Eugene, Zamarion L. Date of Service: 10/10/2014 Medical Record Number: [id number] Patient Account Number: [id number] Date of Birth/Sex: Apr 19, 1920 (81 y.o. 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 M86.372 Chronic multifocal osteomyelitis, left ankle and foot Facility Procedures CPT4 Code: [id number] Description: 99214 - WOUND CARE VISIT-LEV 4 EST PT Modifier: Quantity: 1 Physician Procedures CPT4 Code Description: [id number] 99213 - WC PHYS LEVEL 3 - EST PT ICD-10 Description Diagnosis E11.621 Type 2 diabetes mellitus with foot ulcer L97.323 Non-pressure chronic ulcer of left ankle with necro Modifier: sis of muscle Quantity: 1 Electronic Signature(s) Signed: 10/10/2014 12:18:03 PM By: [name] Kanner MD, FACS Entered By: [name] on 10/10/2014 12:18:03

## 2014-10-17 DIAGNOSIS — E11622 Type 2 diabetes mellitus with other skin ulcer: Secondary | ICD-10-CM | POA: Diagnosis not present

## 2014-10-17 DIAGNOSIS — L97424 Non-pressure chronic ulcer of left heel and midfoot with necrosis of bone: Secondary | ICD-10-CM | POA: Diagnosis not present

## 2014-10-17 DIAGNOSIS — L97324 Non-pressure chronic ulcer of left ankle with necrosis of bone: Secondary | ICD-10-CM | POA: Diagnosis not present

## 2014-10-17 DIAGNOSIS — I70244 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: Secondary | ICD-10-CM | POA: Diagnosis not present

## 2014-10-24 ENCOUNTER — Encounter (HOSPITAL_BASED_OUTPATIENT_CLINIC_OR_DEPARTMENT_OTHER): Payer: Medicare Other | Attending: Plastic Surgery

## 2014-10-24 DIAGNOSIS — Z950 Presence of cardiac pacemaker: Secondary | ICD-10-CM | POA: Insufficient documentation

## 2014-10-24 DIAGNOSIS — E11621 Type 2 diabetes mellitus with foot ulcer: Secondary | ICD-10-CM | POA: Insufficient documentation

## 2014-10-24 DIAGNOSIS — L97522 Non-pressure chronic ulcer of other part of left foot with fat layer exposed: Secondary | ICD-10-CM | POA: Diagnosis not present

## 2014-10-24 DIAGNOSIS — L97424 Non-pressure chronic ulcer of left heel and midfoot with necrosis of bone: Secondary | ICD-10-CM | POA: Diagnosis not present

## 2014-10-24 DIAGNOSIS — I70244 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: Secondary | ICD-10-CM | POA: Diagnosis not present

## 2014-10-24 DIAGNOSIS — I2510 Atherosclerotic heart disease of native coronary artery without angina pectoris: Secondary | ICD-10-CM | POA: Diagnosis not present

## 2014-10-24 DIAGNOSIS — Z951 Presence of aortocoronary bypass graft: Secondary | ICD-10-CM | POA: Insufficient documentation

## 2014-10-24 DIAGNOSIS — L539 Erythematous condition, unspecified: Secondary | ICD-10-CM | POA: Insufficient documentation

## 2014-10-24 DIAGNOSIS — E1140 Type 2 diabetes mellitus with diabetic neuropathy, unspecified: Secondary | ICD-10-CM | POA: Diagnosis not present

## 2014-10-24 DIAGNOSIS — H9193 Unspecified hearing loss, bilateral: Secondary | ICD-10-CM | POA: Diagnosis not present

## 2014-10-24 DIAGNOSIS — I509 Heart failure, unspecified: Secondary | ICD-10-CM | POA: Diagnosis not present

## 2014-10-24 DIAGNOSIS — I252 Old myocardial infarction: Secondary | ICD-10-CM | POA: Insufficient documentation

## 2014-10-24 DIAGNOSIS — Z87891 Personal history of nicotine dependence: Secondary | ICD-10-CM | POA: Diagnosis not present

## 2014-10-24 DIAGNOSIS — E1136 Type 2 diabetes mellitus with diabetic cataract: Secondary | ICD-10-CM | POA: Insufficient documentation

## 2014-10-24 DIAGNOSIS — I70245 Atherosclerosis of native arteries of left leg with ulceration of other part of foot: Secondary | ICD-10-CM | POA: Insufficient documentation

## 2014-10-24 DIAGNOSIS — E1151 Type 2 diabetes mellitus with diabetic peripheral angiopathy without gangrene: Secondary | ICD-10-CM | POA: Diagnosis not present

## 2014-10-25 ENCOUNTER — Encounter: Payer: Self-pay | Admitting: Internal Medicine

## 2014-10-25 NOTE — Progress Notes (Signed)
MAYER, VONDRAK ([id number]) Visit Report for 10/10/2014 Arrival Information Details Patient Name: JUNIUS, FAUCETT. Date of Service: 10/10/2014 11:30 AM Medical Record Number: [id number] Patient Account Number: [id number] Date of Birth/Sex: 02-Feb-1920 (81 y.o. Male) Treating RN: Renee Harder Primary Care Physician: Lindwood Qua Other Clinician: Referring Physician: Lindwood Qua Treating Physician/Extender: Rudene Re in Treatment: 23 Visit Information History Since Last Visit Added or deleted any medications: Yes Patient Arrived: Wheel Chair Any new allergies or adverse reactions: No Arrival Time: 11:35 Had a fall or experienced change in No Accompanied By: son activities of daily living that may affect Transfer Assistance: None risk of falls: Patient Identification Verified: Yes Signs or symptoms of abuse/neglect since last No Secondary Verification Process Yes visito Completed: Hospitalized since last visit: No Patient Requires Transmission-Based No Has Dressing in Place as Prescribed: Yes Precautions: Pain Present Now: Yes Patient Has Alerts: Yes Patient Alerts: 04/27/13 ABI (L) 0.91 (R) 0.71 Electronic Signature(s) Signed: 10/24/2014 4:32:11 PM By: Renee Harder RN Entered By: Renee Harder on 10/10/2014 11:36:36 Ditmars, [name] ([id number]) -------------------------------------------------------------------------------- Clinic Level of Care Assessment Details Patient Name: Lambrecht, Leeman L. Date of Service: 10/10/2014 11:30 AM Medical Record Number: [id number] Patient Account Number: [id number] Date of Birth/Sex: 06/21/1920 (81 y.o. Male) Treating RN: Huel Coventry Primary Care Physician: Lindwood Qua Other Clinician: Referring Physician: Lindwood Qua Treating Physician/Extender: Rudene Re in Treatment: 23 Clinic Level of Care Assessment Items TOOL 4 Quantity Score []  - Use when only an EandM is performed on FOLLOW-UP visit 0 ASSESSMENTS -  Nursing Assessment / Reassessment []  - Reassessment of Co-morbidities (includes updates in patient status) 0 []  - Reassessment of Adherence to Treatment Plan 0 ASSESSMENTS - Wound and Skin Assessment / Reassessment []  - Simple Wound Assessment / Reassessment - one wound 0 X - Complex Wound Assessment / Reassessment - multiple wounds 3 5 []  - Dermatologic / Skin Assessment (not related to wound area) 0 ASSESSMENTS - Focused Assessment []  - Circumferential Edema Measurements - multi extremities 0 []  - Nutritional Assessment / Counseling / Intervention 0 []  - Lower Extremity Assessment (monofilament, tuning fork, pulses) 0 []  - Peripheral Arterial Disease Assessment (using hand held doppler) 0 ASSESSMENTS - Ostomy and/or Continence Assessment and Care []  - Incontinence Assessment and Management 0 []  - Ostomy Care Assessment and Management (repouching, etc.) 0 PROCESS - Coordination of Care X - Simple Patient / Family Education for ongoing care 1 15 []  - Complex (extensive) Patient / Family Education for ongoing care 0 X - Staff obtains Chiropractor, Records, Test Results / Process Orders 1 10 []  - Staff telephones HHA, Nursing Homes / Clarify orders / etc 0 []  - Routine Transfer to another Facility (non-emergent condition) 0 Schuneman, Daronte L. ([id number]) []  - Routine Hospital Admission (non-emergent condition) 0 []  - New Admissions / Manufacturing engineer / Ordering NPWT, Apligraf, etc. 0 []  - Emergency Hospital Admission (emergent condition) 0 X - Simple Discharge Coordination 1 10 []  - Complex (extensive) Discharge Coordination 0 PROCESS - Special Needs []  - Pediatric / Minor Patient Management 0 []  - Isolation Patient Management 0 []  - Hearing / Language / Visual special needs 0 []  - Assessment of Community assistance (transportation, D/C planning, etc.) 0 []  - Additional assistance / Altered mentation 0 []  - Support Surface(s) Assessment (bed, cushion, seat, etc.) 0 INTERVENTIONS -  Wound Cleansing / Measurement []  - Simple Wound Cleansing - one wound 0 X - Complex Wound Cleansing - multiple wounds 3 5 X - Wound Imaging (photographs -  any number of wounds) 1 5 []  - Wound Tracing (instead of photographs) 0 []  - Simple Wound Measurement - one wound 0 X - Complex Wound Measurement - multiple wounds 3 5 INTERVENTIONS - Wound Dressings []  - Small Wound Dressing one or multiple wounds 0 X - Medium Wound Dressing one or multiple wounds 2 15 X - Large Wound Dressing one or multiple wounds 1 20 []  - Application of Medications - topical 0 []  - Application of Medications - injection 0 INTERVENTIONS - Miscellaneous []  - External ear exam 0 Risse, Ely L. ([id number]) []  - Specimen Collection (cultures, biopsies, blood, body fluids, etc.) 0 []  - Specimen(s) / Culture(s) sent or taken to Lab for analysis 0 []  - Patient Transfer (multiple staff / Michiel Sites Lift / Similar devices) 0 []  - Simple Staple / Suture removal (25 or less) 0 []  - Complex Staple / Suture removal (26 or more) 0 []  - Hypo / Hyperglycemic Management (close monitor of Blood Glucose) 0 []  - Ankle / Brachial Index (ABI) - do not check if billed separately 0 X - Vital Signs 1 5 Has the patient been seen at the hospital within the last three years: Yes Total Score: 140 Level Of Care: New/Established - Level 4 Electronic Signature(s) Signed: 10/10/2014 4:49:24 PM By: Elliot Gurney, RN, BSN, Kim RN, BSN Entered By: Elliot Gurney, RN, BSN, Kim on 10/10/2014 12:13:12 Ende, [name] ([id number]) -------------------------------------------------------------------------------- Encounter Discharge Information Details Patient Name: Mcgough, Nile L. Date of Service: 10/10/2014 11:30 AM Medical Record Number: [id number] Patient Account Number: [id number] Date of Birth/Sex: [birth year] (81 y.o. Male) Treating RN: Huel Coventry Primary Care Physician: Lindwood Qua Other Clinician: Referring Physician: Lindwood Qua Treating  Physician/Extender: Rudene Re in Treatment: 20 Encounter Discharge Information Items Discharge Condition: Unstable Ambulatory Status: Wheelchair Discharge Destination: Home Transportation: Private Auto Accompanied By: son Schedule Follow-up Appointment: Yes Medication Reconciliation completed and provided to Patient/Care No Provider: Provided on Clinical Summary of Care: 10/10/2014 Form Type Recipient Paper Patient AE Electronic Signature(s) Signed: 10/24/2014 4:32:11 PM By: Renee Harder RN Previous Signature: 10/10/2014 12:50:20 PM Version By: Francie Massing Entered By: Renee Harder on 10/10/2014 14:33:06 Faiola, [name] ([id number]) -------------------------------------------------------------------------------- Lower Extremity Assessment Details Patient Name: Hubner, Alieu L. Date of Service: 10/10/2014 11:30 AM Medical Record Number: [id number] Patient Account Number: [id number] Date of Birth/Sex: 12-28-1920 (81 y.o. Male) Treating RN: Renee Harder Primary Care Physician: Lindwood Qua Other Clinician: Referring Physician: Lindwood Qua Treating Physician/Extender: Rudene Re in Treatment: 23 Vascular Assessment Pulses: Posterior Tibial Dorsalis Pedis Palpable: [Left:No] Doppler: [Left:Monophasic] Extremity colors, hair growth, and conditions: Extremity Color: [Left:Red] Hair Growth on Extremity: [Left:No] Temperature of Extremity: [Left:Warm] Capillary Refill: [Left:< 3 seconds] Dependent Rubor: [Left:No] Blanched when Elevated: [Left:No] Toe Nail Assessment Left: Right: Thick: Yes Discolored: Yes Deformed: Yes Improper Length and Hygiene: No Electronic Signature(s) Signed: 10/24/2014 4:32:11 PM By: Renee Harder RN Entered By: Renee Harder on 10/10/2014 11:48:26 Strader, Kastin L. ([id number]) -------------------------------------------------------------------------------- Multi Wound Chart Details Patient Name: Parsley, Aadan L. Date of  Service: 10/10/2014 11:30 AM Medical Record Number: [id number] Patient Account Number: [id number] Date of Birth/Sex: [birth year] (81 y.o. 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] Comorbid History: [1:Cataracts, Chronic sinus Cataracts, Chronic sinus Cataracts, Chronic sinus problems/congestion, Congestive Heart Failure, Congestive Heart Failure, Congestive Heart Failure, Coronary Artery Disease, Coronary Artery Disease, Coronary  Artery Disease, Hypertension, Myocardial Hypertension, Myocardial Hypertension, Myocardial Infarction, Peripheral Arterial Disease, Type II Arterial Disease, Type II Arterial Disease, Type II Diabetes, Neuropathy] [2:problems/congestion, Infarction,  Peripheral Diabetes, Neuropathy] [3:problems/congestion, Infarction, Peripheral Diabetes, Neuropathy] Date Acquired: [1:01/24/2014] [2:01/24/2014] [3:01/24/2014] Weeks of Treatment: [1:23] [2:23] [3:23] Wound Status: [1:Open] [2:Open] [3:Open] Clustered Wound: [1:No] [2:Yes] [3:No] Pending Amputation on Yes [2:No] [3:No] Presentation: Measurements L x W x D 7.3x4.3x0.5 [2:0.3x0.3x0.2] [3:0.2x0.3x0.3] (cm) Area (cm) : [1:24.654] [2:0.071] [3:0.047] Volume (cm) : [1:12.327] [2:0.014] [3:0.014] % Reduction in Area: [1:0.30%] [2:98.70%] [3:94.60%] % Reduction in Volume: -66.10%  [2:97.50%] [3:83.70%] Starting Position 1 4 (o'clock): Ending Position 1 [1:5] (o'clock): Maximum Distance 1 0.4 (cm): Dail, Bernis L. ([id number]) Undermining: Yes No N/A Classification: Full Thickness With Full Thickness Without Full Thickness Without Exposed Support Exposed Support Exposed Support Structures Structures Structures HBO Classification: Grade 2 Grade 2 Grade 2 Exudate Amount: Large Small None Present Exudate Type: Serosanguineous N/A N/A Exudate Color: red, brown N/A N/A Foul Odor After Yes No N/A Cleansing: Odor Anticipated Due to No N/A N/A Product Use: Wound Margin: Distinct, outline attached Flat and Intact Flat and Intact Granulation Amount: Large (67-100%) None Present (0%) None Present (0%) Granulation Quality: Red, Pink N/A N/A Necrotic Amount: Small (1-33%) Small (1-33%) Large (67-100%) Necrotic Tissue: Adherent Slough N/A Eschar Exposed Structures: Fascia: No Fascia: No Fascia: No Fat: No Fat: No Fat: No Tendon: No Tendon: No Tendon: No Muscle: No Muscle: No Muscle: No Joint: No Joint: No Joint: No Bone: No Bone: No Bone: No Limited to Skin Limited to Skin Limited to Skin Breakdown Breakdown Breakdown Epithelialization: Small (1-33%) Small (1-33%) Small (1-33%) Periwound Skin Texture: Edema: No Edema: No Callus: Yes Excoriation: No Excoriation: No Edema: No Induration: No Induration: No Excoriation: No Callus: No Callus: No Induration: No Crepitus: No Crepitus: No Crepitus: No Fluctuance: No Fluctuance: No Fluctuance: No Friable: No Friable: No Friable: No Rash: No Rash: No Rash: No Scarring: No Scarring: No Scarring: No Periwound Skin Maceration: Yes Maceration: Yes Dry/Scaly: Yes Moisture: Moist: Yes Moist: Yes Maceration: No Dry/Scaly: No Dry/Scaly: No Moist: No Periwound Skin Color: Erythema: Yes Erythema: Yes Atrophie Blanche: No Atrophie Blanche: No Atrophie Blanche: No Cyanosis: No Cyanosis:  No Cyanosis: No Ecchymosis: No Ecchymosis: No Ecchymosis: No Erythema: No Hemosiderin Staining: No Hemosiderin Staining: No Hemosiderin Staining: No Mottled: No Mottled: No Mottled: No Pallor: No Pallor: No Pallor: No Rubor: No Rubor: No Rubor: No Temperature: No Abnormality No Abnormality No Abnormality Tenderness on No No No Palpation: Wound Preparation: Mikulski, Zymier L. ([id number]) Ulcer Cleansing: Ulcer Cleansing: Ulcer Cleansing: Rinsed/Irrigated with Rinsed/Irrigated with Rinsed/Irrigated with Saline Saline Saline Topical Anesthetic Topical Anesthetic Topical Anesthetic Applied: Other: lidocaine Applied: Other: lidocaine Applied: Other: 4% 4% LIDOCAINE 4% Treatment Notes Electronic Signature(s) Signed: 10/10/2014 4:49:24 PM By: Elliot Gurney, RN, BSN, Kim RN, BSN Entered By: Elliot Gurney, RN, BSN, Kim on 10/10/2014 12:05:17 KAVI, ALMQUIST ([id number]) -------------------------------------------------------------------------------- Multi-Disciplinary Care Plan Details Patient Name: Sabbagh, Hardeep L. Date of Service: 10/10/2014 11:30 AM Medical Record Number: [id number] Patient Account Number: [id number] Date of Birth/Sex: 1920-12-15 (81 y.o. Male) Treating RN: Renee Harder Primary Care Physician: Lindwood Qua Other Clinician: Referring Physician: Lindwood Qua Treating Physician/Extender: Rudene Re in Treatment: 65 Active Inactive Abuse /  Safety / Falls / Self Care Management Nursing Diagnoses: Potential for falls Goals: Patient will remain injury free Date Initiated: 04/28/2014 Goal Status: Active Interventions: Assess fall risk on admission and as needed Notes: Necrotic Tissue Nursing Diagnoses: Impaired tissue integrity related to necrotic/devitalized tissue Goals: Necrotic/devitalized tissue will be minimized in the wound bed Date Initiated: 04/28/2014 Goal Status: Active Interventions: Provide education on necrotic tissue and debridement  process Treatment Activities: Apply topical anesthetic as ordered : 10/10/2014 Notes: Nutrition Nursing Diagnoses: Potential for alteratiion in Nutrition/Potential for imbalanced nutrition Mecca, Tyquon L. ([id number]) Goals: Patient/caregiver agrees to and verbalizes understanding of need to use nutritional supplements and/or vitamins as prescribed Date Initiated: 04/28/2014 Goal Status: Active Interventions: Assess patient nutrition upon admission and as needed per policy Notes: Orientation to the Wound Care Program Nursing Diagnoses: Knowledge deficit related to the wound healing center program Goals: Patient/caregiver will verbalize understanding of the Wound Healing Center Program Date Initiated: 04/28/2014 Goal Status: Active Interventions: Provide education on orientation to the wound center Notes: Wound/Skin Impairment Nursing Diagnoses: Impaired tissue integrity Goals: Ulcer/skin breakdown will have a volume reduction of 30% by week 4 Date Initiated: 04/28/2014 Goal Status: Active Interventions: Assess ulceration(s) every visit Notes: Electronic Signature(s) Signed: 10/10/2014 4:49:24 PM By: Elliot Gurney, RN, BSN, Kim RN, BSN Signed: 10/24/2014 4:32:11 PM By: Renee Harder RN Entered By: Elliot Gurney, RN, BSN, Kim on 10/10/2014 12:05:06 Fellows, [name] ([id number]) Genrich, Pope L. ([id number]) -------------------------------------------------------------------------------- Pain Assessment Details Patient Name: Wanless, Tilford L. Date of Service: 10/10/2014 11:30 AM Medical Record Number: [id number] Patient Account Number: [id number] Date of Birth/Sex: Jun 17, 1920 (81 y.o. Male) Treating RN: Renee Harder Primary Care Physician: Lindwood Qua Other Clinician: Referring Physician: Lindwood Qua Treating Physician/Extender: Rudene Re in Treatment: 23 Active Problems Location of Pain Severity and Description of Pain Patient Has Paino Yes Site Locations Pain  Location: Pain in Ulcers With Dressing Change: Yes Duration of the Pain. Constant / Intermittento Constant Rate the pain. Current Pain Level: 4 Worst Pain Level: 6 Least Pain Level: 2 Character of Pain Describe the Pain: Other: "spasm" Pain Management and Medication Current Pain Management: Electronic Signature(s) Signed: 10/24/2014 4:32:11 PM By: Renee Harder RN Entered By: Renee Harder on 10/10/2014 11:37:19 Laban, [name] ([id number]) -------------------------------------------------------------------------------- Patient/Caregiver Education Details Patient Name: Lora, Onnie L. Date of Service: 10/10/2014 11:30 AM Medical Record Number: [id number] Patient Account Number: [id number] Date of Birth/Gender: [birth year] (81 y.o. Male) Treating RN: Renee Harder Primary Care Physician: Lindwood Qua Other Clinician: Referring Physician: Lindwood Qua Treating Physician/Extender: Rudene Re in Treatment: 53 Education Assessment Education Provided To: Patient and Caregiver Education Topics Provided Wound Debridement: Methods: Explain/Verbal Responses: State content correctly Wound/Skin Impairment: Methods: Explain/Verbal Responses: State content correctly Electronic Signature(s) Signed: 10/24/2014 4:32:11 PM By: Renee Harder RN Entered By: Renee Harder on 10/10/2014 14:33:30 Lepage, [name] ([id number]) -------------------------------------------------------------------------------- Wound Assessment Details Patient Name: Landry, Dare L. Date of Service: 10/10/2014 11:30 AM Medical Record Number: [id number] Patient Account Number: [id number] Date of Birth/Sex: 1920/06/11 (81 y.o. Male) Treating RN: Renee Harder Primary Care Physician: Lindwood Qua Other Clinician: Referring Physician: Lindwood Qua Treating Physician/Extender: Rudene Re in Treatment: 23 Wound Status Wound Number: 1 Primary Arterial Insufficiency Ulcer Etiology: Wound  Location: Left Achilles Wound Open Wounding Event: Gradually Appeared Status: Date Acquired: 01/24/2014 Comorbid Cataracts, Chronic sinus Weeks Of Treatment: 23 History: problems/congestion, Congestive Heart Clustered Wound: No Failure, Coronary Artery Disease, Pending Amputation On Presentation Hypertension, Myocardial Infarction, Peripheral Arterial Disease, Type II Diabetes, Neuropathy Photos Wound Measurements Length: (cm)  7.3 % Reduction i Width: (cm) 4.3 % Reduction i Depth: (cm) 0.5 Epithelializa Area: (cm) 24.654 Undermining: Volume: (cm) 12.327 Starting Ending Pos Maximum Judithann Sauger, Maxxwell L. ([id number]) n Area: 0.3% n Volume: -66.1% tion: Small (1-33%) Yes Position (o'clock): 4 ition (o'clock): 5 stance: (cm) 0.4 Wound Description Full Thickness With Exposed Foul Od Classification: Support Structures Due to Diabetic Severity Grade 2 [address]): Wound Margin: Distinct, outline attached Exudate Amount: Large Exudate Type: Serosanguineous Exudate Color: red, brown or After Cleansing: Yes Product Use: No Wound Bed Granulation Amount: Large (67-100%) Exposed Structure Granulation Quality: Red, Pink Fascia Exposed: No Necrotic Amount: Small (1-33%) Fat Layer Exposed: No Necrotic Quality: Adherent Slough Tendon Exposed: No Muscle Exposed: No Joint Exposed: No Bone Exposed: No Limited to Skin Breakdown Periwound Skin Texture Texture Color No Abnormalities Noted: No No Abnormalities Noted: No Callus: No Atrophie Blanche: No Crepitus: No Cyanosis: No Excoriation: No Ecchymosis: No Fluctuance: No Erythema: Yes Friable: No Hemosiderin Staining: No Induration: No Mottled: No Localized Edema: No Pallor: No Rash: No Rubor: No Scarring: No Temperature / Pain Moisture Temperature: No Abnormality No Abnormalities Noted: No Dry / Scaly: No Maceration: Yes Moist: Yes Wound Preparation Ulcer Cleansing: Rinsed/Irrigated with Saline Topical  Anesthetic Applied: Other: lidocaine 4%, Treatment Notes Wound #1 (Left Achilles) 1. Cleansed with: Clean wound with Normal Saline Champney, Andric L. ([id number]) 2. Anesthetic Topical Lidocaine 4% cream to wound bed prior to debridement 3. Peri-wound Care: Skin Prep 4. Dressing Applied: Aquacel Ag Mepitel 8. Negative Pressure Wound Therapy Wound Vac to wound continuously at 179mm/hg pressure Black Foam Number of foam/gauze pieces used in the dressing = Notes NPWT to left achiles; 1 piece of black foam. Electronic Signature(s) Signed: 10/10/2014 4:49:24 PM By: Elliot Gurney RN, BSN, Kim RN, BSN Signed: 10/24/2014 4:32:11 PM By: Renee Harder RN Entered By: Elliot Gurney, RN, BSN, Kim on 10/10/2014 15:13:30 Fonner, [name] ([id number]) -------------------------------------------------------------------------------- Wound Assessment Details Patient Name: Peaster, Marquie L. Date of Service: 10/10/2014 11:30 AM Medical Record Number: [id number] Patient Account Number: [id number] Date of Birth/Sex: 1920/03/21 (81 y.o. Male) Treating RN: Renee Harder Primary Care Physician: Lindwood Qua Other Clinician: Referring Physician: Lindwood Qua Treating Physician/Extender: Rudene Re in Treatment: 23 Wound Status Wound Number: 2 Primary Arterial Insufficiency Ulcer Etiology: Wound Location: Left Calcaneous - Lateral Wound Open Wounding Event: Gradually Appeared Status: Date Acquired: 01/24/2014 Comorbid Cataracts, Chronic sinus Weeks Of Treatment: 23 History: problems/congestion, Congestive Heart Clustered Wound: Yes Failure, Coronary Artery Disease, Hypertension, Myocardial Infarction, Peripheral Arterial Disease, Type II Diabetes, Neuropathy Photos Photo Uploaded By: Elliot Gurney, RN, BSN, Kim on 10/10/2014 15:11:48 Wound Measurements Length: (cm) 0.3 Width: (cm) 0.3 Depth: (cm) 0.2 Area: (cm) 0.071 Volume: (cm) 0.014 % Reduction in Area: 98.7% % Reduction in Volume:  97.5% Epithelialization: Small (1-33%) Tunneling: No Undermining: No Wound Description Full Thickness Without Foul Odor Aft Classification: Exposed Support Structures Diabetic Severity Grade 2 (Wagner): Wound Margin: Flat and Intact Exudate Amount: Small er Cleansing: No Wound Bed Getchell, Collin L. ([id number]) Granulation Amount: None Present (0%) Exposed Structure Necrotic Amount: Small (1-33%) Fascia Exposed: No Fat Layer Exposed: No Tendon Exposed: No Muscle Exposed: No Joint Exposed: No Bone Exposed: No Limited to Skin Breakdown Periwound Skin Texture Texture Color No Abnormalities Noted: No No Abnormalities Noted: No Callus: No Atrophie Blanche: No Crepitus: No Cyanosis: No Excoriation: No Ecchymosis: No Fluctuance: No Erythema: Yes Friable: No Hemosiderin Staining: No Induration: No Mottled: No Localized Edema: No Pallor: No Rash: No Rubor: No Scarring: No Temperature / Pain Moisture  Temperature: No Abnormality No Abnormalities Noted: No Dry / Scaly: No Maceration: Yes Moist: Yes Wound Preparation Ulcer Cleansing: Rinsed/Irrigated with Saline Topical Anesthetic Applied: Other: lidocaine 4%, Treatment Notes Wound #2 (Left, Lateral Calcaneous) 1. Cleansed with: Clean wound with Normal Saline 2. Anesthetic Topical Lidocaine 4% cream to wound bed prior to debridement 3. Peri-wound Care: Skin Prep 4. Dressing Applied: Aquacel Ag 5. Secondary Dressing Applied Bordered Foam Dressing Electronic Signature(s) Signed: 10/24/2014 4:32:11 PM By: Renee Harder RN Logan Bores, Tico Elbert Ewings ([id number]) Entered By: Renee Harder on 10/10/2014 11:55:35 Glazer, [name] ([id number]) -------------------------------------------------------------------------------- Wound Assessment Details Patient Name: Okerlund, Abdur L. Date of Service: 10/10/2014 11:30 AM Medical Record Number: [id number] Patient Account Number: [id number] Date of Birth/Sex: [birth year] (81 y.o.  Male) Treating RN: Renee Harder Primary Care Physician: Lindwood Qua Other Clinician: Referring Physician: Lindwood Qua Treating Physician/Extender: Rudene Re in Treatment: 23 Wound Status Wound Number: 3 Primary Arterial Insufficiency Ulcer Etiology: Wound Location: Left Toe Great Wound Open Wounding Event: Gradually Appeared Status: Date Acquired: 01/24/2014 Comorbid Cataracts, Chronic sinus Weeks Of Treatment: 23 History: problems/congestion, Congestive Heart Clustered Wound: No Failure, Coronary Artery Disease, Hypertension, Myocardial Infarction, Peripheral Arterial Disease, Type II Diabetes, Neuropathy Photos Photo Uploaded By: Elliot Gurney, RN, BSN, Kim on 10/10/2014 15:11:49 Wound Measurements Length: (cm) 0.2 Width: (cm) 0.3 Depth: (cm) 0.3 Area: (cm) 0.047 Volume: (cm) 0.014 % Reduction in Area: 94.6% % Reduction in Volume: 83.7% Epithelialization: Small (1-33%) Wound Description Full Thickness Without Exposed Classification: Support Structures Diabetic Severity Grade 2 (Wagner): Wound Margin: Flat and Intact Exudate Amount: None Present Wound Bed Bowland, Dariyon L. ([id number]) Granulation Amount: None Present (0%) Exposed Structure Necrotic Amount: Large (67-100%) Fascia Exposed: No Necrotic Quality: Eschar Fat Layer Exposed: No Tendon Exposed: No Muscle Exposed: No Joint Exposed: No Bone Exposed: No Limited to Skin Breakdown Periwound Skin Texture Texture Color No Abnormalities Noted: No No Abnormalities Noted: No Callus: Yes Atrophie Blanche: No Crepitus: No Cyanosis: No Excoriation: No Ecchymosis: No Fluctuance: No Erythema: No Friable: No Hemosiderin Staining: No Induration: No Mottled: No Localized Edema: No Pallor: No Rash: No Rubor: No Scarring: No Temperature / Pain Moisture Temperature: No Abnormality No Abnormalities Noted: No Dry / Scaly: Yes Maceration: No Moist: No Wound Preparation Ulcer Cleansing:  Rinsed/Irrigated with Saline Topical Anesthetic Applied: Other: LIDOCAINE 4%, Treatment Notes Wound #3 (Left Toe Great) 1. Cleansed with: Clean wound with Normal Saline 2. Anesthetic Topical Lidocaine 4% cream to wound bed prior to debridement 4. Dressing Applied: Prisma Ag 5. Secondary Dressing Applied Dry Gauze 7. Secured with Magazine features editor) Signed: 10/24/2014 4:32:11 PM By: Renee Harder RN Logan Bores, Rohan Elbert Ewings ([id number]) Entered By: Renee Harder on 10/10/2014 11:56:07 Parfitt, [name] ([id number]) -------------------------------------------------------------------------------- Vitals Details Patient Name: Beaupre, Conroy L. Date of Service: 10/10/2014 11:30 AM Medical Record Number: [id number] Patient Account Number: [id number] Date of Birth/Sex: 1920/08/27 (81 y.o. Male) Treating RN: Renee Harder Primary Care Physician: Lindwood Qua Other Clinician: Referring Physician: Lindwood Qua Treating Physician/Extender: Rudene Re in Treatment: 23 Vital Signs Time Taken: 11:37 Temperature (F): 97.5 Height (in): 70 Pulse (bpm): 72 Weight (lbs): 200 Respiratory Rate (breaths/min): 24 Body Mass Index (BMI): 28.7 Blood Pressure (mmHg): 114/42 Reference Range: 80 - 120 mg / dl Electronic Signature(s) Signed: 10/24/2014 4:32:11 PM By: Renee Harder RN Entered By: Renee Harder on 10/10/2014 11:39:56

## 2014-10-31 DIAGNOSIS — E11621 Type 2 diabetes mellitus with foot ulcer: Secondary | ICD-10-CM | POA: Diagnosis not present

## 2014-11-07 DIAGNOSIS — L97424 Non-pressure chronic ulcer of left heel and midfoot with necrosis of bone: Secondary | ICD-10-CM | POA: Diagnosis not present

## 2014-11-07 DIAGNOSIS — I70244 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: Secondary | ICD-10-CM | POA: Diagnosis not present

## 2014-11-07 DIAGNOSIS — L97522 Non-pressure chronic ulcer of other part of left foot with fat layer exposed: Secondary | ICD-10-CM | POA: Diagnosis not present

## 2014-11-07 DIAGNOSIS — E11621 Type 2 diabetes mellitus with foot ulcer: Secondary | ICD-10-CM | POA: Diagnosis not present

## 2014-11-14 DIAGNOSIS — I70244 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: Secondary | ICD-10-CM | POA: Diagnosis not present

## 2014-11-14 DIAGNOSIS — L97522 Non-pressure chronic ulcer of other part of left foot with fat layer exposed: Secondary | ICD-10-CM | POA: Diagnosis not present

## 2014-11-14 DIAGNOSIS — L97424 Non-pressure chronic ulcer of left heel and midfoot with necrosis of bone: Secondary | ICD-10-CM | POA: Diagnosis not present

## 2014-11-14 DIAGNOSIS — E11621 Type 2 diabetes mellitus with foot ulcer: Secondary | ICD-10-CM | POA: Diagnosis not present

## 2014-11-21 DIAGNOSIS — E11621 Type 2 diabetes mellitus with foot ulcer: Secondary | ICD-10-CM | POA: Diagnosis not present

## 2014-11-21 DIAGNOSIS — L97522 Non-pressure chronic ulcer of other part of left foot with fat layer exposed: Secondary | ICD-10-CM | POA: Diagnosis not present

## 2014-11-21 DIAGNOSIS — L97424 Non-pressure chronic ulcer of left heel and midfoot with necrosis of bone: Secondary | ICD-10-CM | POA: Diagnosis not present

## 2014-11-21 DIAGNOSIS — I70244 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: Secondary | ICD-10-CM | POA: Diagnosis not present

## 2014-11-28 ENCOUNTER — Encounter (HOSPITAL_BASED_OUTPATIENT_CLINIC_OR_DEPARTMENT_OTHER): Payer: Medicare Other | Attending: Plastic Surgery

## 2014-11-28 DIAGNOSIS — L97524 Non-pressure chronic ulcer of other part of left foot with necrosis of bone: Secondary | ICD-10-CM | POA: Diagnosis not present

## 2014-11-28 DIAGNOSIS — L97424 Non-pressure chronic ulcer of left heel and midfoot with necrosis of bone: Secondary | ICD-10-CM | POA: Diagnosis not present

## 2014-11-28 DIAGNOSIS — I70244 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: Secondary | ICD-10-CM | POA: Diagnosis not present

## 2014-11-28 DIAGNOSIS — E1140 Type 2 diabetes mellitus with diabetic neuropathy, unspecified: Secondary | ICD-10-CM | POA: Insufficient documentation

## 2014-11-28 DIAGNOSIS — I2510 Atherosclerotic heart disease of native coronary artery without angina pectoris: Secondary | ICD-10-CM | POA: Insufficient documentation

## 2014-11-28 DIAGNOSIS — L97514 Non-pressure chronic ulcer of other part of right foot with necrosis of bone: Secondary | ICD-10-CM | POA: Insufficient documentation

## 2014-11-28 DIAGNOSIS — Z86718 Personal history of other venous thrombosis and embolism: Secondary | ICD-10-CM | POA: Insufficient documentation

## 2014-11-28 DIAGNOSIS — E1151 Type 2 diabetes mellitus with diabetic peripheral angiopathy without gangrene: Secondary | ICD-10-CM | POA: Insufficient documentation

## 2014-11-28 DIAGNOSIS — E1169 Type 2 diabetes mellitus with other specified complication: Secondary | ICD-10-CM | POA: Insufficient documentation

## 2014-11-28 DIAGNOSIS — Z950 Presence of cardiac pacemaker: Secondary | ICD-10-CM | POA: Diagnosis not present

## 2014-11-28 DIAGNOSIS — E11621 Type 2 diabetes mellitus with foot ulcer: Secondary | ICD-10-CM | POA: Insufficient documentation

## 2014-11-28 DIAGNOSIS — I509 Heart failure, unspecified: Secondary | ICD-10-CM | POA: Diagnosis not present

## 2014-12-05 DIAGNOSIS — L97524 Non-pressure chronic ulcer of other part of left foot with necrosis of bone: Secondary | ICD-10-CM | POA: Diagnosis not present

## 2014-12-05 DIAGNOSIS — L97514 Non-pressure chronic ulcer of other part of right foot with necrosis of bone: Secondary | ICD-10-CM | POA: Diagnosis not present

## 2014-12-05 DIAGNOSIS — E11621 Type 2 diabetes mellitus with foot ulcer: Secondary | ICD-10-CM | POA: Diagnosis not present

## 2014-12-05 DIAGNOSIS — L97424 Non-pressure chronic ulcer of left heel and midfoot with necrosis of bone: Secondary | ICD-10-CM | POA: Diagnosis not present

## 2014-12-12 DIAGNOSIS — L97424 Non-pressure chronic ulcer of left heel and midfoot with necrosis of bone: Secondary | ICD-10-CM | POA: Diagnosis not present

## 2014-12-12 DIAGNOSIS — E11621 Type 2 diabetes mellitus with foot ulcer: Secondary | ICD-10-CM | POA: Diagnosis not present

## 2014-12-12 DIAGNOSIS — L97514 Non-pressure chronic ulcer of other part of right foot with necrosis of bone: Secondary | ICD-10-CM | POA: Diagnosis not present

## 2014-12-12 DIAGNOSIS — L97524 Non-pressure chronic ulcer of other part of left foot with necrosis of bone: Secondary | ICD-10-CM | POA: Diagnosis not present

## 2014-12-19 ENCOUNTER — Emergency Department (HOSPITAL_COMMUNITY): Payer: Medicare Other

## 2014-12-19 ENCOUNTER — Inpatient Hospital Stay (HOSPITAL_COMMUNITY)
Admission: EM | Admit: 2014-12-19 | Discharge: 2014-12-23 | DRG: 617 | Disposition: A | Discharge status: Skilled Nursing Facility | Payer: Medicare Other | Attending: Internal Medicine | Admitting: Internal Medicine

## 2014-12-19 ENCOUNTER — Encounter (HOSPITAL_COMMUNITY): Payer: Self-pay | Admitting: *Deleted

## 2014-12-19 DIAGNOSIS — E785 Hyperlipidemia, unspecified: Secondary | ICD-10-CM | POA: Diagnosis present

## 2014-12-19 DIAGNOSIS — D62 Acute posthemorrhagic anemia: Secondary | ICD-10-CM | POA: Diagnosis not present

## 2014-12-19 DIAGNOSIS — E11628 Type 2 diabetes mellitus with other skin complications: Secondary | ICD-10-CM | POA: Diagnosis present

## 2014-12-19 DIAGNOSIS — L97409 Non-pressure chronic ulcer of unspecified heel and midfoot with unspecified severity: Secondary | ICD-10-CM | POA: Diagnosis present

## 2014-12-19 DIAGNOSIS — N183 Chronic kidney disease, stage 3 unspecified: Secondary | ICD-10-CM | POA: Diagnosis present

## 2014-12-19 DIAGNOSIS — Z7982 Long term (current) use of aspirin: Secondary | ICD-10-CM

## 2014-12-19 DIAGNOSIS — L97519 Non-pressure chronic ulcer of other part of right foot with unspecified severity: Secondary | ICD-10-CM

## 2014-12-19 DIAGNOSIS — L0390 Cellulitis, unspecified: Secondary | ICD-10-CM | POA: Insufficient documentation

## 2014-12-19 DIAGNOSIS — L02611 Cutaneous abscess of right foot: Secondary | ICD-10-CM | POA: Diagnosis present

## 2014-12-19 DIAGNOSIS — I495 Sick sinus syndrome: Secondary | ICD-10-CM | POA: Diagnosis present

## 2014-12-19 DIAGNOSIS — E1122 Type 2 diabetes mellitus with diabetic chronic kidney disease: Secondary | ICD-10-CM | POA: Diagnosis present

## 2014-12-19 DIAGNOSIS — L97424 Non-pressure chronic ulcer of left heel and midfoot with necrosis of bone: Secondary | ICD-10-CM | POA: Diagnosis not present

## 2014-12-19 DIAGNOSIS — Z87891 Personal history of nicotine dependence: Secondary | ICD-10-CM | POA: Diagnosis not present

## 2014-12-19 DIAGNOSIS — Z95828 Presence of other vascular implants and grafts: Secondary | ICD-10-CM

## 2014-12-19 DIAGNOSIS — E1140 Type 2 diabetes mellitus with diabetic neuropathy, unspecified: Secondary | ICD-10-CM | POA: Diagnosis present

## 2014-12-19 DIAGNOSIS — Z950 Presence of cardiac pacemaker: Secondary | ICD-10-CM | POA: Diagnosis not present

## 2014-12-19 DIAGNOSIS — E1129 Type 2 diabetes mellitus with other diabetic kidney complication: Secondary | ICD-10-CM | POA: Diagnosis present

## 2014-12-19 DIAGNOSIS — L97511 Non-pressure chronic ulcer of other part of right foot limited to breakdown of skin: Secondary | ICD-10-CM | POA: Diagnosis present

## 2014-12-19 DIAGNOSIS — E1152 Type 2 diabetes mellitus with diabetic peripheral angiopathy with gangrene: Secondary | ICD-10-CM | POA: Diagnosis present

## 2014-12-19 DIAGNOSIS — Z951 Presence of aortocoronary bypass graft: Secondary | ICD-10-CM | POA: Diagnosis not present

## 2014-12-19 DIAGNOSIS — N179 Acute kidney failure, unspecified: Secondary | ICD-10-CM | POA: Diagnosis not present

## 2014-12-19 DIAGNOSIS — E11621 Type 2 diabetes mellitus with foot ulcer: Secondary | ICD-10-CM | POA: Insufficient documentation

## 2014-12-19 DIAGNOSIS — Z66 Do not resuscitate: Secondary | ICD-10-CM | POA: Diagnosis not present

## 2014-12-19 DIAGNOSIS — I2510 Atherosclerotic heart disease of native coronary artery without angina pectoris: Secondary | ICD-10-CM | POA: Diagnosis present

## 2014-12-19 DIAGNOSIS — N400 Enlarged prostate without lower urinary tract symptoms: Secondary | ICD-10-CM | POA: Diagnosis present

## 2014-12-19 DIAGNOSIS — L089 Local infection of the skin and subcutaneous tissue, unspecified: Secondary | ICD-10-CM

## 2014-12-19 DIAGNOSIS — E1169 Type 2 diabetes mellitus with other specified complication: Secondary | ICD-10-CM | POA: Diagnosis present

## 2014-12-19 DIAGNOSIS — I509 Heart failure, unspecified: Secondary | ICD-10-CM | POA: Diagnosis present

## 2014-12-19 DIAGNOSIS — L97524 Non-pressure chronic ulcer of other part of left foot with necrosis of bone: Secondary | ICD-10-CM | POA: Diagnosis not present

## 2014-12-19 DIAGNOSIS — I130 Hypertensive heart and chronic kidney disease with heart failure and stage 1 through stage 4 chronic kidney disease, or unspecified chronic kidney disease: Secondary | ICD-10-CM | POA: Diagnosis present

## 2014-12-19 DIAGNOSIS — I10 Essential (primary) hypertension: Secondary | ICD-10-CM | POA: Diagnosis not present

## 2014-12-19 DIAGNOSIS — Z7984 Long term (current) use of oral hypoglycemic drugs: Secondary | ICD-10-CM | POA: Diagnosis not present

## 2014-12-19 DIAGNOSIS — I252 Old myocardial infarction: Secondary | ICD-10-CM | POA: Diagnosis not present

## 2014-12-19 DIAGNOSIS — M86171 Other acute osteomyelitis, right ankle and foot: Secondary | ICD-10-CM

## 2014-12-19 DIAGNOSIS — L03031 Cellulitis of right toe: Secondary | ICD-10-CM | POA: Diagnosis present

## 2014-12-19 DIAGNOSIS — L97514 Non-pressure chronic ulcer of other part of right foot with necrosis of bone: Secondary | ICD-10-CM | POA: Diagnosis not present

## 2014-12-19 DIAGNOSIS — E1121 Type 2 diabetes mellitus with diabetic nephropathy: Secondary | ICD-10-CM | POA: Diagnosis not present

## 2014-12-19 LAB — COMPREHENSIVE METABOLIC PANEL
ALK PHOS: 82 U/L (ref 38–126)
ALT: 31 U/L (ref 17–63)
AST: 24 U/L (ref 15–41)
Albumin: 2.9 g/dL — ABNORMAL LOW (ref 3.5–5.0)
Anion gap: 8 (ref 5–15)
BUN: 18 mg/dL (ref 6–20)
CO2: 26 mmol/L (ref 22–32)
CREATININE: 1.53 mg/dL — AB (ref 0.61–1.24)
Calcium: 8.2 mg/dL — ABNORMAL LOW (ref 8.9–10.3)
Chloride: 98 mmol/L — ABNORMAL LOW (ref 101–111)
GFR calc Af Amer: 44 mL/min — ABNORMAL LOW (ref >60)
GFR, EST NON AFRICAN AMERICAN: 38 mL/min — AB (ref >60)
Glucose, Bld: 163 mg/dL — ABNORMAL HIGH (ref 65–99)
Potassium: 4.8 mmol/L (ref 3.5–5.1)
Sodium: 132 mmol/L — ABNORMAL LOW (ref 135–145)
Total Bilirubin: 0.3 mg/dL (ref 0.3–1.2)
Total Protein: 7.3 g/dL (ref 6.5–8.1)

## 2014-12-19 LAB — CBC
HCT: 37.1 % — ABNORMAL LOW (ref 39.0–52.0)
Hemoglobin: 11.5 g/dL — ABNORMAL LOW (ref 13.0–17.0)
MCH: 26.4 pg (ref 26.0–34.0)
MCHC: 31 g/dL (ref 30.0–36.0)
MCV: 85.3 fL (ref 78.0–100.0)
PLATELETS: 288 10*3/uL (ref 150–400)
RBC: 4.35 MIL/uL (ref 4.22–5.81)
RDW: 15.4 % (ref 11.5–15.5)
WBC: 9.3 10*3/uL (ref 4.0–10.5)

## 2014-12-19 MED ORDER — PIPERACILLIN-TAZOBACTAM 3.375 G IVPB
3.3750 g | Freq: Once | INTRAVENOUS | Status: AC
Start: 2014-12-19 — End: 2014-12-20
  Administered 2014-12-19: 3.375 g via INTRAVENOUS
  Filled 2014-12-19: qty 50

## 2014-12-19 MED ORDER — VANCOMYCIN HCL IN DEXTROSE 1-5 GM/200ML-% IV SOLN
1000.0000 mg | Freq: Once | INTRAVENOUS | Status: AC
  Administered 2014-12-19: 1000 mg via INTRAVENOUS
  Filled 2014-12-19: qty 200

## 2014-12-19 MED ORDER — SODIUM CHLORIDE 0.9 % IV SOLN
Freq: Once | INTRAVENOUS | Status: AC
  Administered 2014-12-19: 21:00:00 via INTRAVENOUS

## 2014-12-19 NOTE — ED Notes (Signed)
Pt reports wound to right big toe x 1 month but has gotten worse over past week. Pt is diabetic and already being treated for wound on left foot.

## 2014-12-19 NOTE — ED Provider Notes (Signed)
CSN: [id number]     Arrival date & time 12/19/14  1622 History   First MD Initiated Contact with Patient 12/19/14 1957     Chief Complaint  Patient presents with  . Wound Infection     (Consider location/radiation/quality/duration/timing/severity/associated sxs/prior Treatment) The history is provided by the patient.    Past Medical History  Diagnosis Date  . Hypertension   . Hyperlipidemia   . Diabetes mellitus   . Coronary artery disease     s/p CABG 1996  . Sick sinus syndrome (HCC)     s/p PPM 2001 (R sided),  RV lead fracture 10/12 with new system (MDT) placed on L side due to R sided venous occlusion  . Diabetic neuropathy (HCC)   . CVD (cardiovascular disease)     s/p CEA  . Peripheral vascular disease (HCC)   . Chronic kidney disease   . BPH (benign prostatic hyperplasia)   . Hypercholesteremia   . Myocardial infarction (HCC)   . CHF (congestive heart failure) (HCC)   . Ulcer of toe (HCC)   . Ulcer of heel and midfoot [facility](HCC)    Past Surgical History  Procedure Laterality Date  . Coronary artery bypass graft    . Carotid endarterectomy    . Pacemaker insertion      s/p PPM 2001 (R sided),  RV lead fracture 10/12 with new system (MDT) placed on Left side due to R sided venous occlusion  . Hernia repair    . Cataract extraction w/ intraocular lens implant Bilateral   . Insert / replace / remove pacemaker    . Wound debridement Left 07/28/2014    Procedure: DEBRIDEMENT WOUND/LEFT HEEL DEBRIDMENT;  Surgeon: Annice NeedyJason S Dew, MD;  Location: [facility] ORS;  Service: Vascular;  Laterality: Left;  . Application of wound vac Left 07/28/2014    Procedure: APPLICATION OF WOUND VAC;  Surgeon: Annice NeedyJason S Dew, MD;  Location: [facility] ORS;  Service: Vascular;  Laterality: Left;   History reviewed. No pertinent family history. Social History  Substance Use Topics  . Smoking status: Former Smoker    Quit date: 11/07/1975  . Smokeless tobacco: Never Used  . Alcohol Use: No    Review of  Systems  Constitutional: Negative for fever and chills.  Musculoskeletal: Positive for joint swelling.  Skin: Positive for wound.  All other systems reviewed and are negative.     Allergies  Ace inhibitors and Ciprofloxacin  Home Medications   Prior to Admission medications   Medication Sig Start Date End Date Taking? Authorizing Provider  aspirin 81 MG tablet Take 81 mg by mouth daily.   Yes Historical Provider, MD  furosemide (LASIX) 40 MG tablet Take 40 mg by mouth daily.  06/01/13  Yes Historical Provider, MD  glipiZIDE (GLUCOTROL) 5 MG tablet Take 5 mg by mouth 2 (two) times daily before a meal. PRN For blood sugars over 120   Yes Historical Provider, MD  metoprolol tartrate (LOPRESSOR) 25 MG tablet Take 25 mg by mouth 2 (two) times daily.     Yes Historical Provider, MD  Multiple Vitamins-Minerals (MENS MULTIVITAMIN PLUS PO) Take 1 tablet by mouth daily.   Yes Historical Provider, MD  Probiotic Product (PHILLIPS COLON HEALTH PO) Take 1 capsule by mouth daily.   Yes Historical Provider, MD  simvastatin (ZOCOR) 40 MG tablet Take 40 mg by mouth every evening.    Yes Historical Provider, MD  Tamsulosin HCl (FLOMAX) 0.4 MG CAPS Take 0.4 mg by mouth every evening.  Yes Historical Provider, MD  HYDROcodone-acetaminophen (NORCO) 5-325 MG per tablet Take 1 tablet by mouth every 6 (six) hours as needed for moderate pain. Patient not taking: Reported on 12/19/2014 07/28/14   Annice Needy, MD   BP 128/68 mmHg  Pulse 69  Temp(Src) 98.4 F (36.9 C) (Oral)  Resp 16  Ht  (1.753 m)  Wt 88.451 kg  BMI 28.78 kg/m2  SpO2 100% Physical Exam  Constitutional: He appears well-developed and well-nourished.  HENT:  Head: Normocephalic.  Eyes: Pupils are equal, round, and reactive to light.  Neck: Normal range of motion.  Cardiovascular: Normal rate.   Pulmonary/Chest: Effort normal.  Musculoskeletal: He exhibits edema. He exhibits no tenderness.       Feet:  No sensation in foot  Skin:  There is erythema.  Nursing note and vitals reviewed.   ED Course  Procedures (including critical care time) Labs Review Labs Reviewed  COMPREHENSIVE METABOLIC PANEL - Abnormal; Notable for the following:    Sodium 132 (*)    Chloride 98 (*)    Glucose, Bld 163 (*)    Creatinine, Ser 1.53 (*)    Calcium 8.2 (*)    Albumin 2.9 (*)    GFR calc non Af Amer 38 (*)    GFR calc Af Amer 44 (*)    All other components within normal limits  CBC - Abnormal; Notable for the following:    Hemoglobin 11.5 (*)    HCT 37.1 (*)    All other components within normal limits    Imaging Review Dg Foot Complete Right  12/19/2014  CLINICAL DATA:  Wound and infection at the superior right great toe, status post fall 1 month ago. Initial encounter. EXAM: RIGHT FOOT COMPLETE - 3+ VIEW COMPARISON:  None. FINDINGS: There is no evidence of fracture or dislocation. No definite osseous erosions are seen. The known soft tissue ulceration is difficult to fully characterize on radiograph. No radiopaque foreign bodies are identified. The joint spaces are preserved. There is no evidence of talar subluxation; the subtalar joint is unremarkable in appearance. Scattered vascular calcifications are seen. IMPRESSION: No evidence of fracture or dislocation. No definite osseous erosion seen. No radiopaque foreign bodies identified. Scattered vascular calcifications seen. Electronically Signed   By: Roanna Raider M.D.   On: 12/19/2014 21:10   I have personally reviewed and evaluated these images and lab results as part of my medical decision-making.   EKG Interpretation None     reviewed.  No evidence of osteo-or myelitis.  White count is within normal parameters.  Patient has been started on IV antibiotics including vancomycin and Zosyn.  He will be admitted to the hospital for continued antibiotic therapy.  MDM   Final diagnoses:  Cellulitis of toe of right foot  Diabetic ulcer of right foot associated with type 2  diabetes mellitus (HCC)         Earley Favor, NP 12/19/14 1610  Alvira Monday, MD 12/25/14 2128  Alvira Monday, MD 12/25/14 2129

## 2014-12-19 NOTE — ED Notes (Signed)
MD at bedside. 

## 2014-12-20 ENCOUNTER — Encounter (HOSPITAL_COMMUNITY): Payer: Self-pay | Admitting: Internal Medicine

## 2014-12-20 DIAGNOSIS — L97519 Non-pressure chronic ulcer of other part of right foot with unspecified severity: Secondary | ICD-10-CM

## 2014-12-20 DIAGNOSIS — N183 Chronic kidney disease, stage 3 unspecified: Secondary | ICD-10-CM | POA: Diagnosis present

## 2014-12-20 DIAGNOSIS — M86171 Other acute osteomyelitis, right ankle and foot: Secondary | ICD-10-CM

## 2014-12-20 DIAGNOSIS — I2510 Atherosclerotic heart disease of native coronary artery without angina pectoris: Secondary | ICD-10-CM | POA: Diagnosis present

## 2014-12-20 DIAGNOSIS — E1121 Type 2 diabetes mellitus with diabetic nephropathy: Secondary | ICD-10-CM

## 2014-12-20 DIAGNOSIS — E1129 Type 2 diabetes mellitus with other diabetic kidney complication: Secondary | ICD-10-CM | POA: Diagnosis present

## 2014-12-20 DIAGNOSIS — L03031 Cellulitis of right toe: Secondary | ICD-10-CM | POA: Insufficient documentation

## 2014-12-20 DIAGNOSIS — E11621 Type 2 diabetes mellitus with foot ulcer: Secondary | ICD-10-CM | POA: Insufficient documentation

## 2014-12-20 LAB — COMPREHENSIVE METABOLIC PANEL
ALT: 23 U/L (ref 17–63)
ANION GAP: 8 (ref 5–15)
AST: 19 U/L (ref 15–41)
Albumin: 2.3 g/dL — ABNORMAL LOW (ref 3.5–5.0)
Alkaline Phosphatase: 65 U/L (ref 38–126)
BUN: 16 mg/dL (ref 6–20)
CHLORIDE: 105 mmol/L (ref 101–111)
CO2: 27 mmol/L (ref 22–32)
CREATININE: 1.58 mg/dL — AB (ref 0.61–1.24)
Calcium: 8.2 mg/dL — ABNORMAL LOW (ref 8.9–10.3)
GFR calc non Af Amer: 37 mL/min — ABNORMAL LOW (ref >60)
GFR, EST AFRICAN AMERICAN: 42 mL/min — AB (ref >60)
Glucose, Bld: 128 mg/dL — ABNORMAL HIGH (ref 65–99)
POTASSIUM: 4.4 mmol/L (ref 3.5–5.1)
SODIUM: 140 mmol/L (ref 135–145)
Total Bilirubin: 0.1 mg/dL — ABNORMAL LOW (ref 0.3–1.2)
Total Protein: 5.9 g/dL — ABNORMAL LOW (ref 6.5–8.1)

## 2014-12-20 LAB — SEDIMENTATION RATE: Sed Rate: 67 mm/hr — ABNORMAL HIGH (ref 0–16)

## 2014-12-20 LAB — GLUCOSE, CAPILLARY
GLUCOSE-CAPILLARY: 132 mg/dL — AB (ref 65–99)
GLUCOSE-CAPILLARY: 210 mg/dL — AB (ref 65–99)
GLUCOSE-CAPILLARY: 60 mg/dL — AB (ref 65–99)
GLUCOSE-CAPILLARY: 81 mg/dL (ref 65–99)
Glucose-Capillary: 124 mg/dL — ABNORMAL HIGH (ref 65–99)
Glucose-Capillary: 99 mg/dL (ref 65–99)

## 2014-12-20 LAB — CBC WITH DIFFERENTIAL/PLATELET
Basophils Absolute: 0 10*3/uL (ref 0.0–0.1)
Basophils Relative: 0 %
Eosinophils Absolute: 0.2 10*3/uL (ref 0.0–0.7)
Eosinophils Relative: 3 %
HEMATOCRIT: 32.3 % — AB (ref 39.0–52.0)
HEMOGLOBIN: 10.1 g/dL — AB (ref 13.0–17.0)
LYMPHS ABS: 1.5 10*3/uL (ref 0.7–4.0)
LYMPHS PCT: 19 %
MCH: 26.7 pg (ref 26.0–34.0)
MCHC: 31.3 g/dL (ref 30.0–36.0)
MCV: 85.4 fL (ref 78.0–100.0)
MONOS PCT: 12 %
Monocytes Absolute: 0.9 10*3/uL (ref 0.1–1.0)
NEUTROS ABS: 5.1 10*3/uL (ref 1.7–7.7)
NEUTROS PCT: 66 %
Platelets: 239 10*3/uL (ref 150–400)
RBC: 3.78 MIL/uL — AB (ref 4.22–5.81)
RDW: 15.3 % (ref 11.5–15.5)
WBC: 7.7 10*3/uL (ref 4.0–10.5)

## 2014-12-20 LAB — C-REACTIVE PROTEIN: CRP: 7.5 mg/dL — ABNORMAL HIGH (ref <1.0)

## 2014-12-20 MED ORDER — PIPERACILLIN-TAZOBACTAM 3.375 G IVPB
3.3750 g | Freq: Three times a day (TID) | INTRAVENOUS | Status: DC
  Administered 2014-12-20 – 2014-12-23 (×9): 3.375 g via INTRAVENOUS
  Filled 2014-12-20 (×12): qty 50

## 2014-12-20 MED ORDER — SIMVASTATIN 40 MG PO TABS
40.0000 mg | ORAL_TABLET | Freq: Every evening | ORAL | Status: DC
  Administered 2014-12-20 – 2014-12-22 (×2): 40 mg via ORAL
  Filled 2014-12-20 (×2): qty 1

## 2014-12-20 MED ORDER — GLIPIZIDE 5 MG PO TABS
5.0000 mg | ORAL_TABLET | Freq: Two times a day (BID) | ORAL | Status: DC
  Administered 2014-12-20: 5 mg via ORAL
  Filled 2014-12-20: qty 1

## 2014-12-20 MED ORDER — ASPIRIN EC 81 MG PO TBEC
81.0000 mg | DELAYED_RELEASE_TABLET | Freq: Every day | ORAL | Status: DC
  Administered 2014-12-20 – 2014-12-23 (×3): 81 mg via ORAL
  Filled 2014-12-20 (×3): qty 1

## 2014-12-20 MED ORDER — FUROSEMIDE 40 MG PO TABS
40.0000 mg | ORAL_TABLET | Freq: Every day | ORAL | Status: DC
  Administered 2014-12-20 – 2014-12-23 (×3): 40 mg via ORAL
  Filled 2014-12-20 (×3): qty 1

## 2014-12-20 MED ORDER — INSULIN ASPART 100 UNIT/ML ~~LOC~~ SOLN
0.0000 [IU] | Freq: Three times a day (TID) | SUBCUTANEOUS | Status: DC
  Administered 2014-12-20 – 2014-12-23 (×3): 1 [IU] via SUBCUTANEOUS

## 2014-12-20 MED ORDER — ACETAMINOPHEN 650 MG RE SUPP: 650.0000 mg | Freq: Four times a day (QID) | RECTAL | Status: DC | PRN

## 2014-12-20 MED ORDER — SODIUM CHLORIDE 0.9 % IJ SOLN
3.0000 mL | Freq: Two times a day (BID) | INTRAMUSCULAR | Status: DC
  Administered 2014-12-20 – 2014-12-22 (×3): 3 mL via INTRAVENOUS

## 2014-12-20 MED ORDER — TAMSULOSIN HCL 0.4 MG PO CAPS
0.4000 mg | ORAL_CAPSULE | Freq: Every evening | ORAL | Status: DC
  Administered 2014-12-20 – 2014-12-22 (×2): 0.4 mg via ORAL
  Filled 2014-12-20 (×2): qty 1

## 2014-12-20 MED ORDER — VANCOMYCIN HCL IN DEXTROSE 1-5 GM/200ML-% IV SOLN
1000.0000 mg | INTRAVENOUS | Status: DC
  Administered 2014-12-20 – 2014-12-22 (×2): 1000 mg via INTRAVENOUS
  Filled 2014-12-20 (×2): qty 200

## 2014-12-20 MED ORDER — ONDANSETRON HCL 4 MG/2ML IJ SOLN
4.0000 mg | Freq: Four times a day (QID) | INTRAMUSCULAR | Status: DC | PRN
  Administered 2014-12-21: 4 mg via INTRAVENOUS

## 2014-12-20 MED ORDER — ONDANSETRON HCL 4 MG PO TABS: 4.0000 mg | ORAL_TABLET | Freq: Four times a day (QID) | ORAL | Status: DC | PRN

## 2014-12-20 MED ORDER — ACETAMINOPHEN 325 MG PO TABS: 650.0000 mg | ORAL_TABLET | Freq: Four times a day (QID) | ORAL | Status: DC | PRN

## 2014-12-20 MED ORDER — METOPROLOL TARTRATE 25 MG PO TABS
25.0000 mg | ORAL_TABLET | Freq: Two times a day (BID) | ORAL | Status: DC
  Administered 2014-12-20 – 2014-12-22 (×5): 25 mg via ORAL
  Filled 2014-12-20 (×6): qty 1

## 2014-12-20 NOTE — Evaluation (Signed)
Physical Therapy Evaluation Patient Details Name: [name] MRN: [id number] DOB: [birth year] Today's Date: 12/20/2014   History of Present Illness  81 year old male with a history of abuse mellitus, hypertension, coronary artery disease, sick sinus syndrome status post pacemaker, hyperlipidemia presented with nonhealing wound on his right great toe.   Clinical Impression  Pt admitted with the above diagnosis. Pt currently with functional limitations due to the deficits listed below (see PT Problem List). Reports having falls recently with transfers. Non-ambulatory at baseline but able to stand/pivot transfer without physical assist. Today required in assist to perform bed mobility and transfer safely. Anticipate he will need ST-SNF prior to returning home. Pt will benefit from skilled PT to increase their independence and safety with mobility to allow discharge to the venue listed below.       Follow Up Recommendations SNF (Likely need SNF, pending orthopedic intervention)    Equipment Recommendations  None recommended by PT    Recommendations for Other Services OT consult     Precautions / Restrictions Precautions Precautions: Fall Precaution Comments: Lt heel wound Required Braces or Orthoses: Other Brace/Splint Other Brace/Splint: darco shoe, Lt      Mobility  Bed Mobility Overal bed mobility: Needs Assistance Bed Mobility: Supine to Sit     Supine to sit: Min assist;HOB elevated     General bed mobility comments: Min assist for support to pull trunk to EOB. VC for technique. Very slow with LE movement.  Transfers Overall transfer level: Needs assistance Equipment used: Rolling walker (2 wheeled) Transfers: Sit to/from UGI CorporationStand;Stand Pivot Transfers Sit to Stand: Min assist Stand pivot transfers: Min assist       General transfer comment: Min assist for boost from lowest bed setting. VC for hand placement and anterior weight-shift. Min assist for walker control  with pivot to chair. VC for upright posture. Very small pivotal steps without buckling.  Ambulation/Gait             General Gait Details: States he is non-ambulatory  Information systems managertairs            Wheelchair Mobility    Modified Rankin (Stroke Patients Only)       Balance Overall balance assessment: Needs assistance;History of Falls Sitting-balance support: No upper extremity supported;Feet supported Sitting balance-Leahy Scale: Good     Standing balance support: Bilateral upper extremity supported Standing balance-Leahy Scale: Poor                               Pertinent Vitals/Pain Pain Assessment: No/denies pain    Home Living Family/patient expects to be discharged to:: Private residence Living Arrangements: Alone Available Help at Discharge: Personal care attendant ("none" -CNA 3x/week) Type of Home: House Home Access: Ramped entrance     Home Layout: One level Home Equipment: Walker - 2 wheels;Walker - 4 wheels;Wheelchair - power;Wheelchair - manual      Prior Function Level of Independence: Needs assistance   Gait / Transfers Assistance Needed: Transfers independently, non ambulatory  ADL's / Homemaking Assistance Needed: bathing from CNA        Hand Dominance        Extremity/Trunk Assessment   Upper Extremity Assessment: Defer to OT evaluation           Lower Extremity Assessment: Generalized weakness (Rt foot edematous, erythema, Lt heel bandaged)         Communication   Communication: No difficulties  Cognition Arousal/Alertness: Awake/alert Behavior During  Therapy: WFL for tasks assessed/performed Overall Cognitive Status: Within Functional Limits for tasks assessed                      General Comments General comments (skin integrity, edema, etc.): Rt foot edema, erythema, toe bandaged (hallux). Lt heel bandaged.    Exercises General Exercises - Lower Extremity Ankle Circles/Pumps: AROM;Both;10  reps;Seated      Assessment/Plan    PT Assessment Patient needs continued PT services  PT Diagnosis Difficulty walking;Generalized weakness   PT Problem List Decreased strength;Decreased range of motion;Decreased activity tolerance;Decreased balance;Decreased mobility;Decreased knowledge of use of DME;Impaired sensation;Decreased skin integrity  PT Treatment Interventions DME instruction;Gait training;Functional mobility training;Therapeutic activities;Therapeutic exercise;Balance training;Neuromuscular re-education;Patient/family education   PT Goals (Current goals can be found in the Care Plan section) Acute Rehab PT Goals Patient Stated Goal: None stated PT Goal Formulation: With patient Time For Goal Achievement: 01/03/15 Potential to Achieve Goals: Good    Frequency Min 3X/week   Barriers to discharge Decreased caregiver support lives alone    Co-evaluation               End of Session Equipment Utilized During Treatment: Gait belt Activity Tolerance: Patient tolerated treatment well Patient left: in chair;with call bell/phone within reach;with chair alarm set;with family/visitor present Nurse Communication: Mobility status    Functional Assessment Tool Used: clincial observation Functional Limitation: Mobility: Walking and moving around Mobility: Walking and Moving Around Current Status [phone]): At least 20 percent but less than 40 percent impaired, limited or restricted Mobility: Walking and Moving Around Goal Status [phone]): At least 1 percent but less than 20 percent impaired, limited or restricted    Time: 1242-1303 PT Time Calculation (min) (ACUTE ONLY): 21 min   Charges:   PT Evaluation $Initial PT Evaluation Tier I: 1 Procedure     PT G Codes:   PT G-Codes **NOT FOR INPATIENT CLASS** Functional Assessment Tool Used: clincial observation Functional Limitation: Mobility: Walking and moving around Mobility: Walking and Moving Around Current Status  (J4782): At least 20 percent but less than 40 percent impaired, limited or restricted Mobility: Walking and Moving Around Goal Status [phone]): At least 1 percent but less than 20 percent impaired, limited or restricted    Berton Mount 12/20/2014, 1:11 PM Sunday [place], [place] [phone]

## 2014-12-20 NOTE — Clinical Social Work Note (Signed)
Clinical Social Work Assessment  Patient Details  Name: [name] MRN: [id number] Date of Birth: Jun 15, 1920  Date of referral:  12/20/14               Reason for consult:  Facility Placement                Permission sought to share information with:  Facility Sport and exercise psychologist, Family Supports Permission granted to share information::  Yes, Verbal Permission Granted  Name::     Engineer, maintenance::  SNF's  Relationship::  Son  Contact Information:  [phone]  Housing/Transportation Living arrangements for the past 2 months:  Single Family Home Source of Information:  Patient Patient Interpreter Needed:  None Criminal Activity/Legal Involvement Pertinent to Current Situation/Hospitalization:  No - Comment as needed Significant Relationships:  Adult Children, Spouse Lives with:  Self Do you feel safe going back to the place where you live?  No Need for family participation in patient care:  Yes (Comment)  Care giving concerns:  CSW received referral for possible SNF placement at time of discharge. CSW met with patient regarding PT recommendation of SNF placement at time of discharge. Per patient, patient lives alone and is currently unable to care for himself at home given patient's current physical needs and fall risk. Patient expressed understanding of PT recommendation and is agreeable to SNF placement at time of discharge. CSW to continue to follow and assist with discharge planning needs.   Social Worker assessment / plan:  CSW spoke with patient concerning possibility of rehab at [facility] before returning home.  Employment status:  Retired Forensic scientist:  Medicare PT Recommendations:  [place] / Referral to community resources:  [place]  Patient/Family's Response to care:  Patient recognizes need for rehab before returning home and is agreeable to a SNF. Patient reported preference for a place near his home in [place] .  Patient/Family's Understanding of and Emotional Response to Diagnosis, Current Treatment, and Prognosis:  Patient is realistic regarding therapy needs. No questions/concerns about plan or treatment.    Emotional Assessment Appearance:  Appears stated age, Appears younger than stated age Attitude/Demeanor/Rapport:    Affect (typically observed):  Accepting, Appropriate Orientation:  Oriented to Self, Oriented to Place, Oriented to  Time, Oriented to Situation Alcohol / Substance use:  Not Applicable Psych involvement (Current and /or in the community):  No (Comment)  Discharge Needs  Concerns to be addressed:  No discharge needs identified Readmission within the last 30 days:  No Current discharge risk:  None Barriers to Discharge:  Continued Medical Work up   [employer], LCSW 12/20/2014, 4:52 PM

## 2014-12-20 NOTE — Clinical Social Work Placement (Signed)
   CLINICAL SOCIAL WORK PLACEMENT  NOTE  Date:  12/20/2014  Patient Details  Name: [name] MRN: [id number] Date of Birth: 01/21/1921  Clinical Social Work is seeking post-discharge placement for this patient at the Skilled  Nursing Facility level of care (*CSW will initial, date and re-position this form in  chart as items are completed):  Yes   Patient/family provided with [facility] Clinical Social Work Department's list of facilities offering this level of care within the geographic area requested by the patient (or if unable, by the patient's family).  Yes   Patient/family informed of their freedom to choose among providers that offer the needed level of care, that participate in Medicare, Medicaid or managed care program needed by the patient, have an available bed and are willing to accept the patient.  Yes   Patient/family informed of [name]'s ownership interest in [facility] and [facility], as well as of the fact that they are under no obligation to receive care at these facilities.  PASRR submitted to EDS on 12/20/14     PASRR number received on 12/20/14     Existing PASRR number confirmed on       FL2 transmitted to all facilities in geographic area requested by pt/family on 12/20/14     FL2 transmitted to all facilities within larger geographic area on       Patient informed that his/her managed care company has contracts with or will negotiate with certain facilities, including the following:            Patient/family informed of bed offers received.  Patient chooses bed at       Physician recommends and patient chooses bed at      Patient to be transferred to   on  .  Patient to be transferred to facility by       Patient family notified on   of transfer.  Name of family member notified:        PHYSICIAN Please sign FL2, Please sign DNR     Additional Comment:    _______________________________________________ Izora RibasHoloman, Doctor [name],  LCSW 12/20/2014, 5:04 PM

## 2014-12-20 NOTE — Progress Notes (Signed)
Utilization review complete. Charlane Westry RN CCM Case Mgmt phone [phone] 

## 2014-12-20 NOTE — H&P (Signed)
[facility] History and Physical  [name] AVW:[id number] DOB: March 12, 1920 DOA: 12/19/2014  Referring physician: Ms.Schultz. PCP: Lindwood Qua, MD  Specialists: Patient follows up at [facility].  Chief Complaint: Right great toe infection.  HPI: [name] is a 81 y.o. male with history of CAD status post CABG, sick sinus syndrome status post pacemaker placement, diabetes mellitus2, peripheral vascular disease status post stent placement in early part of this year, CHF with chronic lower extremity wound of the left heel presents to the ER because of nonhealing wound in the right great toe. Patient sustained injury month ago following which patient has been followed by his PCP with antibiotic courses and was referred to plastic surgery. Patient had wound debridement done by plastic surgery last week and patient had followed up with plastic surgery yesterday and since patient's wound has become worse with eschar formation patient was referred to the ER. X-rays do not show any bony involvement. On exam patient has significant swelling of the right great toe with discolored skin and eschar formation. Patient does have a palpable pulse. There is also some surrounding erythema of the distal aspect of the foot. Patient has been admitted for further management of cellulitis.   Review of Systems: As presented in the history of presenting illness, rest negative.  Past Medical History  Diagnosis Date  . Hypertension   . Hyperlipidemia   . Diabetes mellitus   . Coronary artery disease     s/p CABG 1996  . Sick sinus syndrome (HCC)     s/p PPM 2001 (R sided),  RV lead fracture 10/12 with new system (MDT) placed on L side due to R sided venous occlusion  . Diabetic neuropathy (HCC)   . CVD (cardiovascular disease)     s/p CEA  . Peripheral vascular disease (HCC)   . Chronic kidney disease   . BPH (benign prostatic hyperplasia)   . Hypercholesteremia   .  Myocardial infarction (HCC)   . CHF (congestive heart failure) (HCC)   . Ulcer of toe (HCC)   . Ulcer of heel and midfoot [facility])    Past Surgical History  Procedure Laterality Date  . Coronary artery bypass graft    . Carotid endarterectomy    . Pacemaker insertion      s/p PPM 2001 (R sided),  RV lead fracture 10/12 with new system (MDT) placed on Left side due to R sided venous occlusion  . Hernia repair    . Cataract extraction w/ intraocular lens implant Bilateral   . Insert / replace / remove pacemaker    . Wound debridement Left 07/28/2014    Procedure: DEBRIDEMENT WOUND/LEFT HEEL DEBRIDMENT;  Surgeon: Annice Needy, MD;  Location: [facility] ORS;  Service: Vascular;  Laterality: Left;  . Application of wound vac Left 07/28/2014    Procedure: APPLICATION OF WOUND VAC;  Surgeon: Annice Needy, MD;  Location: [facility] ORS;  Service: Vascular;  Laterality: Left;   Social History:  reports that he quit smoking about 39 years ago. He has never used smokeless tobacco. He reports that he does not drink alcohol or use illicit drugs. Where does patient live home. Can patient participate in ADLs? Yes.  Allergies  Allergen Reactions  . Ace Inhibitors     Kidney failure  . Ciprofloxacin Rash    Family History:  Family History  Problem Relation Age of Onset  . CAD Brother       Prior to Admission medications   Medication  Sig Start Date End Date Taking? Authorizing Provider  aspirin 81 MG tablet Take 81 mg by mouth daily.   Yes Historical Provider, MD  furosemide (LASIX) 40 MG tablet Take 40 mg by mouth daily.  06/01/13  Yes Historical Provider, MD  glipiZIDE (GLUCOTROL) 5 MG tablet Take 5 mg by mouth 2 (two) times daily before a meal. PRN For blood sugars over 120   Yes Historical Provider, MD  metoprolol tartrate (LOPRESSOR) 25 MG tablet Take 25 mg by mouth 2 (two) times daily.     Yes Historical Provider, MD  Multiple Vitamins-Minerals (MENS MULTIVITAMIN PLUS PO) Take 1 tablet by mouth daily.   Yes  Historical Provider, MD  Probiotic Product (PHILLIPS COLON HEALTH PO) Take 1 capsule by mouth daily.   Yes Historical Provider, MD  simvastatin (ZOCOR) 40 MG tablet Take 40 mg by mouth every evening.    Yes Historical Provider, MD  Tamsulosin HCl (FLOMAX) 0.4 MG CAPS Take 0.4 mg by mouth every evening.    Yes Historical Provider, MD  HYDROcodone-acetaminophen (NORCO) 5-325 MG per tablet Take 1 tablet by mouth every 6 (six) hours as needed for moderate pain. Patient not taking: Reported on 12/19/2014 07/28/14   Annice Needy, MD    Physical Exam: Filed Vitals:   12/19/14 2300 12/19/14 2315 12/19/14 2345 12/20/14 0000  BP: 129/84 168/64 118/51 126/48  Pulse: 69 74 72 71  Temp:      TempSrc:      Resp:      Height:      Weight:      SpO2: 98% 100% 98% 99%     General:  Moderately built and nourished.  Eyes: Anicteric. No pallor.  ENT: No discharge from the ears eyes nose and mouth.  Neck: No mass felt.  Cardiovascular: S1 and S2 heard.  Respiratory: No rhonchi or crepitations.  Abdomen: Soft nontender bowel sounds present.  Skin: Right great toe looks swollen with eschar. There is also mild surrounding erythema of the right foot and the distal aspect.  Musculoskeletal: See skin section. There is also a dressing of the left heel and left great toe.  Psychiatric: Appears normal.  Neurologic: Alert awake oriented to time place and person. Moves all extremities.  Labs on Admission:  Basic Metabolic Panel:  Recent Labs Lab 12/19/14 1658  NA 132*  K 4.8  CL 98*  CO2 26  GLUCOSE 163*  BUN 18  CREATININE 1.53*  CALCIUM 8.2*   Liver Function Tests:  Recent Labs Lab 12/19/14 1658  AST 24  ALT 31  ALKPHOS 82  BILITOT 0.3  PROT 7.3  ALBUMIN 2.9*   No results for input(s): LIPASE, AMYLASE in the last 168 hours. No results for input(s): AMMONIA in the last 168 hours. CBC:  Recent Labs Lab 12/19/14 1658  WBC 9.3  HGB 11.5*  HCT 37.1*  MCV 85.3  PLT 288    Cardiac Enzymes: No results for input(s): CKTOTAL, CKMB, CKMBINDEX, TROPONINI in the last 168 hours.  BNP (last 3 results) No results for input(s): BNP in the last 8760 hours.  ProBNP (last 3 results) No results for input(s): PROBNP in the last 8760 hours.  CBG: No results for input(s): GLUCAP in the last 168 hours.  Radiological Exams on Admission: Dg Foot Complete Right  12/19/2014  CLINICAL DATA:  Wound and infection at the superior right great toe, status post fall 1 month ago. Initial encounter. EXAM: RIGHT FOOT COMPLETE - 3+ VIEW COMPARISON:  None. FINDINGS:  There is no evidence of fracture or dislocation. No definite osseous erosions are seen. The known soft tissue ulceration is difficult to fully characterize on radiograph. No radiopaque foreign bodies are identified. The joint spaces are preserved. There is no evidence of talar subluxation; the subtalar joint is unremarkable in appearance. Scattered vascular calcifications are seen. IMPRESSION: No evidence of fracture or dislocation. No definite osseous erosion seen. No radiopaque foreign bodies identified. Scattered vascular calcifications seen. Electronically Signed   By: Roanna RaiderJeffery  Chang M.D.   On: 12/19/2014 21:10     Assessment/Plan Principal Problem:   Cellulitis of toe, right Active Problems:   Essential hypertension   Sick sinus syndrome (HCC)   DM (diabetes mellitus), type 2 with renal complications (HCC)   CAD (coronary artery disease)   CKD (chronic kidney disease) stage 3, GFR 30-59 ml/min   1. Cellulitis of the right foot great toe - patient has eschar formation of the right great toe. Please consult orthopedic surgeon in a.m. For now patient has been placed on empiric antibiotics. Wound team consult has been requested. 2. Diabetes mellitus type 2 - presently sugars are within acceptable limits. While inpatient and will keep patient on sliding scale coverage and hold metformin. 3. Peripheral vascular disease  status post intervention in early part of this year for the left lower extremity with chronic nonhealing wound of the left lower extremity - continue aspirin. Wound team consult. 4. Chronic kidney disease stage III - creatinine appears to be at baseline. 5. History of CHF - appears compensated continue Lasix. 6. CAD status post CABG - denies any chest pain. 7. Sick sinus syndrome status post pacemaker placement. 8. Hypertension - continue present medications.  I have reviewed patient's old charts and labs. Personally reviewed x-rays.   DVT Prophylaxis SCDs.  Code Status: DO NOT RESUSCITATE.  Family Communication: Discussed with patient.  Disposition Plan: Admit to inpatient.    Lyon Dumont N. [facility] Pager [phone].  If 7PM-7AM, please contact night-coverage [email or web address] Password [email or web address] 12/20/2014, 12:26 AM

## 2014-12-20 NOTE — Progress Notes (Signed)
ANTIBIOTIC CONSULT NOTE - INITIAL  Pharmacy Consult for Vancomycin and Zosyn  Indication: cellulitis  Allergies  Allergen Reactions  . Ace Inhibitors     Kidney failure  . Ciprofloxacin Rash    Patient Measurements: Height: 5\' 9"  (175.3 cm) Weight: 195 lb (88.451 kg) IBW/kg (Calculated) : 70.7  Vital Signs: Temp: 98.4 F (36.9 C) (11/28 1641) Temp Source: Oral (11/28 1641) BP: 126/48 mmHg (11/29 0000) Pulse Rate: 71 (11/29 0000) Intake/Output from previous day:   Intake/Output from this shift:    Labs:  Recent Labs  12/19/14 1658  WBC 9.3  HGB 11.5*  PLT 288  CREATININE 1.53*   Estimated Creatinine Clearance: 34.6 mL/min (by C-G formula based on Cr of 1.53). No results for input(s): VANCOTROUGH, VANCOPEAK, VANCORANDOM, GENTTROUGH, GENTPEAK, GENTRANDOM, TOBRATROUGH, TOBRAPEAK, TOBRARND, AMIKACINPEAK, AMIKACINTROU, AMIKACIN in the last 72 hours.   Microbiology: No results found for this or any previous visit (from the past 720 hour(s)).  Medical History: Past Medical History  Diagnosis Date  . Hypertension   . Hyperlipidemia   . Diabetes mellitus   . Coronary artery disease     s/p CABG 1996  . Sick sinus syndrome (HCC)     s/p PPM 2001 (R sided),  RV lead fracture 10/12 with new system (MDT) placed on L side due to R sided venous occlusion  . Diabetic neuropathy (HCC)   . CVD (cardiovascular disease)     s/p CEA  . Peripheral vascular disease (HCC)   . Chronic kidney disease   . BPH (benign prostatic hyperplasia)   . Hypercholesteremia   . Myocardial infarction (HCC)   . CHF (congestive heart failure) (HCC)   . Ulcer of toe (HCC)   . Ulcer of heel and midfoot (HCC)     Medications:  Prescriptions prior to admission  Medication Sig Dispense Refill Last Dose  . aspirin 81 MG tablet Take 81 mg by mouth daily.   12/19/2014 at Unknown time  . furosemide (LASIX) 40 MG tablet Take 40 mg by mouth daily.    12/19/2014 at Unknown time  . glipiZIDE  (GLUCOTROL) 5 MG tablet Take 5 mg by mouth 2 (two) times daily before a meal. PRN For blood sugars over 120   12/19/2014 at Unknown time  . metoprolol tartrate (LOPRESSOR) 25 MG tablet Take 25 mg by mouth 2 (two) times daily.     12/19/2014 at 0800  . Multiple Vitamins-Minerals (MENS MULTIVITAMIN PLUS PO) Take 1 tablet by mouth daily.   12/19/2014 at Unknown time  . Probiotic Product (PHILLIPS COLON HEALTH PO) Take 1 capsule by mouth daily.   12/19/2014 at Unknown time  . simvastatin (ZOCOR) 40 MG tablet Take 40 mg by mouth every evening.    12/19/2014 at Unknown time  . Tamsulosin HCl (FLOMAX) 0.4 MG CAPS Take 0.4 mg by mouth every evening.    12/19/2014 at Unknown time  . HYDROcodone-acetaminophen (NORCO) 5-325 MG per tablet Take 1 tablet by mouth every 6 (six) hours as needed for moderate pain. (Patient not taking: Reported on 12/19/2014) 30 tablet 0 Not Taking at Unknown time   Assessment 81 y.o. male with R foot wound/cellulitis for empiric antibiotics.  Vancomycin 1 g IV given in ED at  2140  Goal of Therapy:  Vancomycin trough level 10-15 mcg/ml  Plan:  Vancomycin 1 g IV q48h Zosyn 3.375 g IV q8h   Colter Magowan, Gary FleetGregory Vernon 12/20/2014,12:48 AM

## 2014-12-20 NOTE — Progress Notes (Signed)
Hypoglycemic Event  CBG: 60  Treatment: 15 GM carbohydrate snack  Symptoms: None  Follow-up CBG: Time:1815 CBG Result:81  Possible Reasons for Event: Unknown  Comments/MD notified:dinner tray in front of patient. Let patient eat meal then rechecked CBG.    Madelin RearWhitaker, Mairyn Lenahan Ray

## 2014-12-20 NOTE — Consult Note (Signed)
Reason for Consult: Osteomyelitis ulceration abscess right great toe Referring Physician: Dr. [name] Stall is an 81 y.o. male.  HPI: Patient is a 81 year old gentleman with diabetic insensate neuropathy who presents with ulceration and cellulitis with exposed bone of the great toe right foot. Patient states the ulcers been present for several weeks and getting worse quickly.  Past Medical History  Diagnosis Date  . Hypertension   . Hyperlipidemia   . Diabetes mellitus   . Coronary artery disease     s/p CABG 1996  . Sick sinus syndrome ([place])     s/p PPM 2001 (R sided),  RV lead fracture 10/12 with new system (MDT) placed on L side due to R sided venous occlusion  . Diabetic neuropathy ([place])   . CVD (cardiovascular disease)     s/p CEA  . Peripheral vascular disease ([place])   . Chronic kidney disease   . BPH (benign prostatic hyperplasia)   . Hypercholesteremia   . Myocardial infarction ([place])   . CHF (congestive heart failure) ([place])   . Ulcer of toe ([place])   . Ulcer of heel and midfoot [facility])     Past Surgical History  Procedure Laterality Date  . Coronary artery bypass graft    . Carotid endarterectomy    . Pacemaker insertion      s/p PPM 2001 (R sided),  RV lead fracture 10/12 with new system (MDT) placed on Left side due to R sided venous occlusion  . Hernia repair    . Cataract extraction w/ intraocular lens implant Bilateral   . Insert / replace / remove pacemaker    . Wound debridement Left 07/28/2014    Procedure: DEBRIDEMENT WOUND/LEFT HEEL DEBRIDMENT;  Surgeon: Algernon Huxley, MD;  Location: [facility] ORS;  Service: Vascular;  Laterality: Left;  . Application of wound vac Left 07/28/2014    Procedure: APPLICATION OF WOUND VAC;  Surgeon: Algernon Huxley, MD;  Location: [facility] ORS;  Service: Vascular;  Laterality: Left;    Family History  Problem Relation Age of Onset  . CAD Brother     Social History:  reports that he quit smoking about 39 years ago. He has never used  smokeless tobacco. He reports that he does not drink alcohol or use illicit drugs.  Allergies:  Allergies  Allergen Reactions  . Ace Inhibitors     Kidney failure  . Ciprofloxacin Rash    Medications: I have reviewed the patient's current medications.  Results for orders placed or performed during the hospital encounter of 12/19/14 (from the past 48 hour(s))  Comprehensive metabolic panel     Status: Abnormal   Collection Time: 12/19/14  4:58 PM  Result Value Ref Range   Sodium 132 (L) 135 - 145 mmol/L   Potassium 4.8 3.5 - 5.1 mmol/L   Chloride 98 (L) 101 - 111 mmol/L   CO2 26 22 - 32 mmol/L   Glucose, Bld 163 (H) 65 - 99 mg/dL   BUN 18 6 - 20 mg/dL   Creatinine, Ser 1.53 (H) 0.61 - 1.24 mg/dL   Calcium 8.2 (L) 8.9 - 10.3 mg/dL   Total Protein 7.3 6.5 - 8.1 g/dL   Albumin 2.9 (L) 3.5 - 5.0 g/dL   AST 24 15 - 41 U/L   ALT 31 17 - 63 U/L   Alkaline Phosphatase 82 38 - 126 U/L   Total Bilirubin 0.3 0.3 - 1.2 mg/dL   GFR calc non Af Amer 38 (L) >60 mL/min  GFR calc Af Amer 44 (L) >60 mL/min    Comment: (NOTE) The eGFR has been calculated using the CKD EPI equation. This calculation has not been validated in all clinical situations. eGFR's persistently <60 mL/min signify possible Chronic Kidney Disease.    Anion gap 8 5 - 15  CBC     Status: Abnormal   Collection Time: 12/19/14  4:58 PM  Result Value Ref Range   WBC 9.3 4.0 - 10.5 K/uL   RBC 4.35 4.22 - 5.81 MIL/uL   Hemoglobin 11.5 (L) 13.0 - 17.0 g/dL   HCT 37.1 (L) 39.0 - 52.0 %   MCV 85.3 78.0 - 100.0 fL   MCH 26.4 26.0 - 34.0 pg   MCHC 31.0 30.0 - 36.0 g/dL   RDW 15.4 11.5 - 15.5 %   Platelets 288 150 - 400 K/uL  Glucose, capillary     Status: Abnormal   Collection Time: 12/20/14 12:51 AM  Result Value Ref Range   Glucose-Capillary 210 (H) 65 - 99 mg/dL  Comprehensive metabolic panel     Status: Abnormal   Collection Time: 12/20/14  6:21 AM  Result Value Ref Range   Sodium 140 135 - 145 mmol/L   Potassium  4.4 3.5 - 5.1 mmol/L   Chloride 105 101 - 111 mmol/L   CO2 27 22 - 32 mmol/L   Glucose, Bld 128 (H) 65 - 99 mg/dL   BUN 16 6 - 20 mg/dL   Creatinine, Ser 1.58 (H) 0.61 - 1.24 mg/dL   Calcium 8.2 (L) 8.9 - 10.3 mg/dL   Total Protein 5.9 (L) 6.5 - 8.1 g/dL   Albumin 2.3 (L) 3.5 - 5.0 g/dL   AST 19 15 - 41 U/L   ALT 23 17 - 63 U/L   Alkaline Phosphatase 65 38 - 126 U/L   Total Bilirubin 0.1 (L) 0.3 - 1.2 mg/dL   GFR calc non Af Amer 37 (L) >60 mL/min   GFR calc Af Amer 42 (L) >60 mL/min    Comment: (NOTE) The eGFR has been calculated using the CKD EPI equation. This calculation has not been validated in all clinical situations. eGFR's persistently <60 mL/min signify possible Chronic Kidney Disease.    Anion gap 8 5 - 15  CBC WITH DIFFERENTIAL     Status: Abnormal   Collection Time: 12/20/14  6:21 AM  Result Value Ref Range   WBC 7.7 4.0 - 10.5 K/uL   RBC 3.78 (L) 4.22 - 5.81 MIL/uL   Hemoglobin 10.1 (L) 13.0 - 17.0 g/dL   HCT 32.3 (L) 39.0 - 52.0 %   MCV 85.4 78.0 - 100.0 fL   MCH 26.7 26.0 - 34.0 pg   MCHC 31.3 30.0 - 36.0 g/dL   RDW 15.3 11.5 - 15.5 %   Platelets 239 150 - 400 K/uL   Neutrophils Relative % 66 %   Neutro Abs 5.1 1.7 - 7.7 K/uL   Lymphocytes Relative 19 %   Lymphs Abs 1.5 0.7 - 4.0 K/uL   Monocytes Relative 12 %   Monocytes Absolute 0.9 0.1 - 1.0 K/uL   Eosinophils Relative 3 %   Eosinophils Absolute 0.2 0.0 - 0.7 K/uL   Basophils Relative 0 %   Basophils Absolute 0.0 0.0 - 0.1 K/uL  Glucose, capillary     Status: Abnormal   Collection Time: 12/20/14  7:52 AM  Result Value Ref Range   Glucose-Capillary 124 (H) 65 - 99 mg/dL  Glucose, capillary     Status:  Abnormal   Collection Time: 12/20/14 11:40 AM  Result Value Ref Range   Glucose-Capillary 132 (H) 65 - 99 mg/dL  Sedimentation rate     Status: Abnormal   Collection Time: 12/20/14 12:45 PM  Result Value Ref Range   Sed Rate 67 (H) 0 - 16 mm/hr  C-reactive protein     Status: Abnormal    Collection Time: 12/20/14 12:45 PM  Result Value Ref Range   CRP 7.5 (H) <1.0 mg/dL  Glucose, capillary     Status: Abnormal   Collection Time: 12/20/14  5:42 PM  Result Value Ref Range   Glucose-Capillary 60 (L) 65 - 99 mg/dL    Dg Foot Complete Right  12/19/2014  CLINICAL DATA:  Wound and infection at the superior right great toe, status post fall 1 month ago. Initial encounter. EXAM: RIGHT FOOT COMPLETE - 3+ VIEW COMPARISON:  None. FINDINGS: There is no evidence of fracture or dislocation. No definite osseous erosions are seen. The known soft tissue ulceration is difficult to fully characterize on radiograph. No radiopaque foreign bodies are identified. The joint spaces are preserved. There is no evidence of talar subluxation; the subtalar joint is unremarkable in appearance. Scattered vascular calcifications are seen. IMPRESSION: No evidence of fracture or dislocation. No definite osseous erosion seen. No radiopaque foreign bodies identified. Scattered vascular calcifications seen. Electronically Signed   By: Garald Balding M.D.   On: 12/19/2014 21:10    Review of Systems  All other systems reviewed and are negative.  Blood pressure 96/45, pulse 70, temperature 98 F (36.7 C), temperature source Oral, resp. rate 20, height 5' 9"  (1.753 m), weight 88.451 kg (195 lb), SpO2 96 %. Physical Exam On examination patient does have a palpable dorsalis pedis pulse he has cellulitis to the midfoot. There is exposed bone at the IP joint of the right great toe with necrotic tissue involving the entire dorsum of the great toe. Radiographs do not show any bony abnormalities. Assessment/Plan: Assessment: Osteomyelitis ulceration abscess and cellulitis right great toe.  Plan: Patient does not have any toe salvage options. Recommend proceeding with a first ray amputation. Risks and benefits were discussed including risk of the wound not healing. Patient states he understands wishes to proceed at this time  we will plan for surgery tomorrow Wednesday at 4 PM.  Abdulaziz Toman V 12/20/2014, 6:09 PM

## 2014-12-20 NOTE — Consult Note (Addendum)
WOC wound consult note Reason for Consult: Consult requested for left heel and right toe.  Pt has been followed by the outpatient wound care center prior to admission for chronic left heel wound.  He recently fell and developed a wound on the right great toe. 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  Wound is healing; 4X1X.2cm, moist pink wound bed, small amt yellow drainage, no odor.   Right great toe with full thickness wound, 3X4cm, 100% eschar, small amt tan drainage, no odor. Generalized edema and erythremia. Pt has limited perfusion when assessed with a doppler, according to the bedside nurse. Pressure Ulcer POA: Yes Dressing procedure/placement/frequency: Xeroform applied to both sites to protect from further injury.  Please refer to ortho service for further plan of care to right great toe, topical treatment will not be effective to promote healing. EMR indicates that ortho service has been consulted. Pt can resume follow-up with the outpatient wound care center after discharge. Please re-consult if further assistance is needed.  Thank-you,  Cammie Mcgeeawn Mercedes Valeriano MSN, RN, CWOCN, StarbuckWCN-AP, CNS [phone]

## 2014-12-20 NOTE — [place] FL2 (Signed)
[place] MEDICAID FL2 LEVEL OF CARE SCREENING TOOL     IDENTIFICATION  Patient Name: [name] Birthdate: October 04, 1920 Sex: male Admission Date (Current Location): 12/19/2014  [facility] and [place] Number: [employer] and Address:  [facility], [address] [place], [place] [address]      Provider Number: [id number]  Attending Physician Name and Address:  Catarina Hartshorn, MD  Relative Name and Phone Number:       Current Level of Care: Hospital Recommended Level of Care: Skilled Nursing Facility Prior Approval Number:    Date Approved/Denied:   PASRR Number: [id number]  Discharge Plan: SNF    Current Diagnoses: Patient Active Problem List   Diagnosis Date Noted  . Cellulitis of toe, right 12/20/2014  . DM (diabetes mellitus), type 2 with renal complications (HCC) 12/20/2014  . CAD (coronary artery disease) 12/20/2014  . CKD (chronic kidney disease) stage 3, GFR 30-59 ml/min 12/20/2014  . Acute osteomyelitis of toe of right foot (HCC) 12/20/2014  . Diabetic ulcer of right foot associated with type 2 diabetes mellitus (HCC)   . Cellulitis of toe of right foot   . Cellulitis 12/19/2014  . Type 1 diabetes mellitus with diabetic heel ulcer (HCC) 09/19/2014  . Complete heart block (HCC) 12/10/2012  . Pacemaker-[employer]tr[employer] 11/20/2011  . DIZZINESS 11/01/2008  . EDEMA 11/01/2008  . DM 04/15/2008  . DIABETIC PERIPHERAL NEUROPATHY 04/15/2008  . HYPERLIPIDEMIA 04/15/2008  . Essential hypertension 04/15/2008  . CAD 04/15/2008  . Sick sinus syndrome (HCC) 04/15/2008    Orientation ACTIVITIES/SOCIAL BLADDER RESPIRATION    Self, Time, Situation, Place    Continent Normal  BEHAVIORAL SYMPTOMS/MOOD NEUROLOGICAL BOWEL NUTRITION STATUS      Continent Diet (carb modified)  PHYSICIAN VISITS COMMUNICATION OF NEEDS Height & Weight Skin    Verbally  (175.3 cm) 195 lbs. Surgical wounds          AMBULATORY STATUS RESPIRATION     (non-ambulatory) Normal      Personal Care Assistance Level of Assistance  Bathing, Dressing Bathing Assistance: Maximum assistance   Dressing Assistance: Maximum assistance      Functional Limitations Info                SPECIAL CARE FACTORS FREQUENCY  PT (By licensed PT)     PT Frequency: 5/wk             Additional Factors Info  Code Status, Allergies, Insulin Sliding Scale Code Status Info: DNR Allergies Info: ace inhibitors, ciprofloxacin   Insulin Sliding Scale Info: 3/day       Current Medications (12/20/2014):  This is the current hospital active medication list Current Facility-Administered Medications  Medication Dose Route Frequency Provider Last Rate Last Dose  . acetaminophen (TYLENOL) tablet 650 mg  650 mg Oral Q6H PRN Eduard Clos, MD       Or  . acetaminophen (TYLENOL) suppository 650 mg  650 mg Rectal Q6H PRN Eduard Clos, MD      . aspirin EC tablet 81 mg  81 mg Oral Daily Eduard Clos, MD   81 mg at 12/20/14 5621  . furosemide (LASIX) tablet 40 mg  40 mg Oral Daily Eduard Clos, MD   40 mg at 12/20/14 3086  . insulin aspart (novoLOG) injection 0-9 Units  0-9 Units Subcutaneous TID WC Eduard Clos, MD   1 Units at 12/20/14 1252  . metoprolol tartrate (LOPRESSOR) tablet 25 mg  25 mg Oral  BID Eduard ClosArshad N Kakrakandy, MD   25 mg at 12/20/14 [id number]  . ondansetron (ZOFRAN) tablet 4 mg  4 mg Oral Q6H PRN Eduard ClosArshad N Kakrakandy, MD       Or  . ondansetron [facility](ZOFRAN) injection 4 mg  4 mg Intravenous Q6H PRN Eduard ClosArshad N Kakrakandy, MD      . piperacillin-tazobactam (ZOSYN) IVPB 3.375 g  3.375 g Intravenous 3 times per day Eduard ClosArshad N Kakrakandy, MD   3.375 g at 12/20/14 1457  . simvastatin (ZOCOR) tablet 40 mg  40 mg Oral QPM Eduard ClosArshad N Kakrakandy, MD      . sodium chloride 0.9 % injection 3 mL  3 mL Intravenous Q12H Eduard ClosArshad N Kakrakandy, MD   3 mL at 12/20/14 0045  . tamsulosin (FLOMAX) capsule 0.4 mg  0.4 mg Oral QPM Eduard ClosArshad N Kakrakandy,  MD      . vancomycin (VANCOCIN) IVPB 1000 mg/200 mL premix  1,000 mg Intravenous Q48H Eduard ClosArshad N Kakrakandy, MD   1,000 mg at 12/20/14 0518     Discharge Medications: Please see discharge summary for a list of discharge medications.  Relevant Imaging Results:  Relevant Lab Results:  Recent Labs    Additional Information SS#: [id number]  Izora RibasHoloman, Tim Corriher M, LCSW

## 2014-12-20 NOTE — Progress Notes (Addendum)
PROGRESS NOTE  BRASEN BUNDREN TIW:[id number] DOB: 1920/02/18 DOA: 12/21/2014 PCP: Raelene Bott, MD  Brief History 81 year old male with a history of abuse mellitus, hypertension, coronary artery disease, sick sinus syndrome status post pacemaker, hyperlipidemia presented with nonhealing wound on his right great toe. The patient has been going to the wound care center at [facility] for the past month to 2 months. The patient has been on a number of antibiotic regimens and has had a number of debridements at the wound care center. His last antibody regimen with levofloxacin which he finished 2-3 weeks ago. Unfortunately, his right great toe continues to worsen with increasing necrotic tissue. When he presented to the [facility]. yesterday, he was told to come to the emergency department for further evaluation. Assessment/Plan: Diabetic foot infection/osteomyelitis right great toe -The patient has necrotic tissue with exposed bone on examination -Case was discussed with the patient's plastic surgeon (Dr. Leland Johns) who felt pt needs ortho -I consulted Dr. Sharol Given -pt likely needs amputation -continue IV abx for now pending ortho evaluation -check ABIs -ESR, CRP -12/21/2014--R-foot xray without any erosions Diabetes mellitus type 2 with vascular complications and renal consultations -Hemoglobin A1c -Discontinue glipizide -NovoLog sliding scale Hypertension -Continue metoprolol tartrate Coronary artery disease with history of CABG -Continue aspirin -No anginal symptoms -Continue metoprolol tartrate and statin CKD stage III -Baseline creatinine 1.5-1.7 Sick sinus syndrome/complete heart block -Status post permanent pacemaker -Stable -Follows Dr. Rayann Heman Peripheral vascular disease  -status post intervention in early part of this year for the left lower extremity with chronic nonhealing wound of the left lower extremity - continue aspirin.    Family Communication:   Son  updated  at beside--total time 60 min; >50% spent counseling and coordinating care Disposition Plan:   Home when medically stable       Procedures/Studies: Dg Foot Complete Right  2014/12/21  CLINICAL DATA:  Wound and infection at the superior right great toe, status post fall 1 month ago. Initial encounter. EXAM: RIGHT FOOT COMPLETE - 3+ VIEW COMPARISON:  None. FINDINGS: There is no evidence of fracture or dislocation. No definite osseous erosions are seen. The known soft tissue ulceration is difficult to fully characterize on radiograph. No radiopaque foreign bodies are identified. The joint spaces are preserved. There is no evidence of talar subluxation; the subtalar joint is unremarkable in appearance. Scattered vascular calcifications are seen. IMPRESSION: No evidence of fracture or dislocation. No definite osseous erosion seen. No radiopaque foreign bodies identified. Scattered vascular calcifications seen. Electronically Signed   By: Garald Balding M.D.   On: 12/21/2014 21:10         Subjective: Patient denies fevers, chills, headache, chest pain, dyspnea, nausea, vomiting, diarrhea, abdominal pain, dysuria, hematuria   Objective: Filed Vitals:   Dec 21, 2014 2345 12/20/14 0000 12/20/14 0053 12/20/14 0526  BP: 118/51 126/48 141/61 130/50  Pulse: 72 71 80 70  Temp:   98.1 F (36.7 C) 98.3 F (36.8 C)  TempSrc:   Oral Oral  Resp:   20 20  Height:      Weight:      SpO2: 98% 99% 100% 99%    Intake/Output Summary (Last 24 hours) at 12/20/14 1141 Last data filed at 12/20/14 0700  Gross per 24 hour  Intake 1071.67 ml  Output    200 ml  Net 871.67 ml   Weight change:  Exam:   General:  Pt is alert, follows commands appropriately, not in acute  distress  HEENT: No icterus, No thrush, No neck mass, [place]/AT  Cardiovascular: RRR, S1/S2, no rubs, no gallops  Respiratory: CTA bilaterally, no wheezing, no crackles, no rhonchi  Abdomen: Soft/+BS, non tender, non distended,  no guarding; no hepatosplenomegaly  Extremities: 1+LE edema, No lymphangitis, No petechiae, No rashes, no synovitis; right hallux with necrotic tissue and palpable bone. Small amount of purulent drainage. Dorsalis pedis pulse palpable  Data Reviewed: Basic Metabolic Panel:  Recent Labs Lab 12/19/14 1658 12/20/14 0621  NA 132* 140  K 4.8 4.4  CL 98* 105  CO2 26 27  GLUCOSE 163* 128*  BUN 18 16  CREATININE 1.53* 1.58*  CALCIUM 8.2* 8.2*   Liver Function Tests:  Recent Labs Lab 12/19/14 1658 12/20/14 0621  AST 24 19  ALT 31 23  ALKPHOS 82 65  BILITOT 0.3 0.1*  PROT 7.3 5.9*  ALBUMIN 2.9* 2.3*   No results for input(s): LIPASE, AMYLASE in the last 168 hours. No results for input(s): AMMONIA in the last 168 hours. CBC:  Recent Labs Lab 12/19/14 1658 12/20/14 0621  WBC 9.3 7.7  NEUTROABS  --  5.1  HGB 11.5* 10.1*  HCT 37.1* 32.3*  MCV 85.3 85.4  PLT 288 239   Cardiac Enzymes: No results for input(s): CKTOTAL, CKMB, CKMBINDEX, TROPONINI in the last 168 hours. BNP: Invalid input(s): POCBNP CBG:  Recent Labs Lab 12/20/14 0051 12/20/14 0752  GLUCAP 210* 124*    No results found for this or any previous visit (from the past 240 hour(s)).   Scheduled Meds: . aspirin EC  81 mg Oral Daily  . furosemide  40 mg Oral Daily  . glipiZIDE  5 mg Oral BID AC  . insulin aspart  0-9 Units Subcutaneous TID WC  . metoprolol tartrate  25 mg Oral BID  . piperacillin-tazobactam (ZOSYN)  IV  3.375 g Intravenous 3 times per day  . simvastatin  40 mg Oral QPM  . sodium chloride  3 mL Intravenous Q12H  . tamsulosin  0.4 mg Oral QPM  . vancomycin  1,000 mg Intravenous Q48H   Continuous Infusions:    Kayona Foor, DO  [facility] Pager [phone]  If 7PM-7AM, please contact night-coverage [email or web address] Password [email or web address] 12/20/2014, 11:41 AM   LOS: 1 day

## 2014-12-21 ENCOUNTER — Encounter (HOSPITAL_COMMUNITY)
Admission: EM | Disposition: A | Discharge status: Skilled Nursing Facility | Payer: Self-pay | Source: Home / Self Care | Attending: Internal Medicine

## 2014-12-21 ENCOUNTER — Inpatient Hospital Stay (HOSPITAL_COMMUNITY): Payer: Medicare Other | Admitting: Anesthesiology

## 2014-12-21 ENCOUNTER — Encounter: Payer: Medicare Other | Admitting: Internal Medicine

## 2014-12-21 ENCOUNTER — Encounter (HOSPITAL_COMMUNITY): Payer: Self-pay | Admitting: Anesthesiology

## 2014-12-21 DIAGNOSIS — E1122 Type 2 diabetes mellitus with diabetic chronic kidney disease: Secondary | ICD-10-CM

## 2014-12-21 DIAGNOSIS — M86171 Other acute osteomyelitis, right ankle and foot: Secondary | ICD-10-CM

## 2014-12-21 DIAGNOSIS — N183 Chronic kidney disease, stage 3 (moderate): Secondary | ICD-10-CM

## 2014-12-21 DIAGNOSIS — I10 Essential (primary) hypertension: Secondary | ICD-10-CM

## 2014-12-21 DIAGNOSIS — I495 Sick sinus syndrome: Secondary | ICD-10-CM

## 2014-12-21 HISTORY — PX: AMPUTATION: SHX166

## 2014-12-21 LAB — COMPREHENSIVE METABOLIC PANEL
ALT: 26 U/L (ref 17–63)
ANION GAP: 8 (ref 5–15)
AST: 24 U/L (ref 15–41)
Albumin: 2.4 g/dL — ABNORMAL LOW (ref 3.5–5.0)
Alkaline Phosphatase: 66 U/L (ref 38–126)
BUN: 19 mg/dL (ref 6–20)
CHLORIDE: 100 mmol/L — AB (ref 101–111)
CO2: 28 mmol/L (ref 22–32)
Calcium: 8 mg/dL — ABNORMAL LOW (ref 8.9–10.3)
Creatinine, Ser: 2.03 mg/dL — ABNORMAL HIGH (ref 0.61–1.24)
GFR calc Af Amer: 31 mL/min — ABNORMAL LOW (ref >60)
GFR, EST NON AFRICAN AMERICAN: 27 mL/min — AB (ref >60)
GLUCOSE: 112 mg/dL — AB (ref 65–99)
POTASSIUM: 4.5 mmol/L (ref 3.5–5.1)
Sodium: 136 mmol/L (ref 135–145)
TOTAL PROTEIN: 6.4 g/dL — AB (ref 6.5–8.1)
Total Bilirubin: 0.8 mg/dL (ref 0.3–1.2)

## 2014-12-21 LAB — CBC WITH DIFFERENTIAL/PLATELET
Basophils Absolute: 0 10*3/uL (ref 0.0–0.1)
Basophils Relative: 0 %
EOS PCT: 2 %
Eosinophils Absolute: 0.1 10*3/uL (ref 0.0–0.7)
HCT: 35.5 % — ABNORMAL LOW (ref 39.0–52.0)
Hemoglobin: 10.8 g/dL — ABNORMAL LOW (ref 13.0–17.0)
LYMPHS ABS: 1.7 10*3/uL (ref 0.7–4.0)
LYMPHS PCT: 22 %
MCH: 26.1 pg (ref 26.0–34.0)
MCHC: 30.4 g/dL (ref 30.0–36.0)
MCV: 85.7 fL (ref 78.0–100.0)
MONO ABS: 0.6 10*3/uL (ref 0.1–1.0)
MONOS PCT: 8 %
Neutro Abs: 5.4 10*3/uL (ref 1.7–7.7)
Neutrophils Relative %: 68 %
PLATELETS: 264 10*3/uL (ref 150–400)
RBC: 4.14 MIL/uL — AB (ref 4.22–5.81)
RDW: 15.3 % (ref 11.5–15.5)
WBC: 7.9 10*3/uL (ref 4.0–10.5)

## 2014-12-21 LAB — GLUCOSE, CAPILLARY
GLUCOSE-CAPILLARY: 86 mg/dL (ref 65–99)
GLUCOSE-CAPILLARY: 96 mg/dL (ref 65–99)
GLUCOSE-CAPILLARY: 176 mg/dL — AB (ref 65–99)
Glucose-Capillary: 80 mg/dL (ref 65–99)
Glucose-Capillary: 79 mg/dL (ref 65–99)
Glucose-Capillary: 94 mg/dL (ref 65–99)

## 2014-12-21 LAB — BASIC METABOLIC PANEL
Anion gap: 9 (ref 5–15)
BUN: 23 mg/dL — AB (ref 6–20)
CHLORIDE: 101 mmol/L (ref 101–111)
CO2: 25 mmol/L (ref 22–32)
CREATININE: 1.9 mg/dL — AB (ref 0.61–1.24)
Calcium: 7.9 mg/dL — ABNORMAL LOW (ref 8.9–10.3)
GFR calc Af Amer: 34 mL/min — ABNORMAL LOW (ref >60)
GFR calc non Af Amer: 29 mL/min — ABNORMAL LOW (ref >60)
GLUCOSE: 84 mg/dL (ref 65–99)
Potassium: 4.3 mmol/L (ref 3.5–5.1)
SODIUM: 135 mmol/L (ref 135–145)

## 2014-12-21 LAB — TYPE AND SCREEN
ABO/RH(D): O NEG
ANTIBODY SCREEN: NEGATIVE

## 2014-12-21 LAB — SURGICAL PCR SCREEN
MRSA, PCR: POSITIVE — AB
STAPHYLOCOCCUS AUREUS: POSITIVE — AB

## 2014-12-21 LAB — HEMOGLOBIN A1C
Hgb A1c MFr Bld: 8.1 % — ABNORMAL HIGH (ref 4.8–5.6)
Mean Plasma Glucose: 186 mg/dL

## 2014-12-21 LAB — PROTIME-INR
INR: 1.11 (ref 0.00–1.49)
PROTHROMBIN TIME: 14.5 s (ref 11.6–15.2)

## 2014-12-21 LAB — ABO/RH: ABO/RH(D): O NEG

## 2014-12-21 SURGERY — AMPUTATION, FOOT, RAY
Anesthesia: Monitor Anesthesia Care | Site: Foot | Laterality: Right

## 2014-12-21 MED ORDER — 0.9 % SODIUM CHLORIDE (POUR BTL) OPTIME
TOPICAL | Status: DC | PRN
  Administered 2014-12-21: 1000 mL

## 2014-12-21 MED ORDER — LIDOCAINE HCL (CARDIAC) 20 MG/ML IV SOLN
INTRAVENOUS | Status: DC | PRN
  Administered 2014-12-21: 30 mg via INTRAVENOUS

## 2014-12-21 MED ORDER — ACETAMINOPHEN 325 MG PO TABS: 650.0000 mg | ORAL_TABLET | Freq: Four times a day (QID) | ORAL | Status: DC | PRN

## 2014-12-21 MED ORDER — MUPIROCIN 2 % EX OINT
1.0000 "application " | TOPICAL_OINTMENT | Freq: Two times a day (BID) | CUTANEOUS | Status: DC
  Administered 2014-12-21 – 2014-12-23 (×5): 1 via NASAL
  Filled 2014-12-21: qty 22

## 2014-12-21 MED ORDER — FENTANYL CITRATE (PF) 100 MCG/2ML IJ SOLN
INTRAMUSCULAR | Status: DC | PRN
  Administered 2014-12-21: 50 ug via INTRAVENOUS

## 2014-12-21 MED ORDER — ONDANSETRON HCL 4 MG/2ML IJ SOLN
INTRAMUSCULAR | Status: AC
  Filled 2014-12-21: qty 2

## 2014-12-21 MED ORDER — SODIUM CHLORIDE 0.9 % IV SOLN
INTRAVENOUS | Status: DC | PRN
  Administered 2014-12-21: 16:00:00 via INTRAVENOUS

## 2014-12-21 MED ORDER — FENTANYL CITRATE (PF) 100 MCG/2ML IJ SOLN: 25.0000 ug | INTRAMUSCULAR | Status: DC | PRN

## 2014-12-21 MED ORDER — FENTANYL CITRATE (PF) 250 MCG/5ML IJ SOLN
INTRAMUSCULAR | Status: AC
  Filled 2014-12-21: qty 5

## 2014-12-21 MED ORDER — SODIUM CHLORIDE 0.9 % IV SOLN: INTRAVENOUS | Status: DC

## 2014-12-21 MED ORDER — ONDANSETRON HCL 4 MG PO TABS: 4.0000 mg | ORAL_TABLET | Freq: Four times a day (QID) | ORAL | Status: DC | PRN

## 2014-12-21 MED ORDER — ONDANSETRON HCL 4 MG/2ML IJ SOLN: 4.0000 mg | Freq: Four times a day (QID) | INTRAMUSCULAR | Status: DC | PRN

## 2014-12-21 MED ORDER — METOCLOPRAMIDE HCL 5 MG/ML IJ SOLN: 5.0000 mg | Freq: Three times a day (TID) | INTRAMUSCULAR | Status: DC | PRN

## 2014-12-21 MED ORDER — METHOCARBAMOL 500 MG PO TABS: 500.0000 mg | ORAL_TABLET | Freq: Four times a day (QID) | ORAL | Status: DC | PRN

## 2014-12-21 MED ORDER — PROPOFOL 10 MG/ML IV BOLUS
INTRAVENOUS | Status: AC
  Filled 2014-12-21: qty 20

## 2014-12-21 MED ORDER — LIDOCAINE HCL (PF) 1 % IJ SOLN
INTRAMUSCULAR | Status: DC | PRN
  Administered 2014-12-21: 10 mL

## 2014-12-21 MED ORDER — LIDOCAINE HCL (CARDIAC) 20 MG/ML IV SOLN
INTRAVENOUS | Status: AC
  Filled 2014-12-21: qty 5

## 2014-12-21 MED ORDER — CHLORHEXIDINE GLUCONATE CLOTH 2 % EX PADS
6.0000 | MEDICATED_PAD | Freq: Every day | CUTANEOUS | Status: DC
  Administered 2014-12-21 – 2014-12-23 (×3): 6 via TOPICAL

## 2014-12-21 MED ORDER — ACETAMINOPHEN 650 MG RE SUPP: 650.0000 mg | Freq: Four times a day (QID) | RECTAL | Status: DC | PRN

## 2014-12-21 MED ORDER — METHOCARBAMOL 1000 MG/10ML IJ SOLN
500.0000 mg | Freq: Four times a day (QID) | INTRAVENOUS | Status: DC | PRN
  Filled 2014-12-21: qty 5

## 2014-12-21 MED ORDER — PROMETHAZINE HCL 25 MG/ML IJ SOLN: 6.2500 mg | INTRAMUSCULAR | Status: DC | PRN

## 2014-12-21 MED ORDER — METOCLOPRAMIDE HCL 10 MG PO TABS: 5.0000 mg | ORAL_TABLET | Freq: Three times a day (TID) | ORAL | Status: DC | PRN

## 2014-12-21 MED ORDER — LIDOCAINE HCL (PF) 1 % IJ SOLN
INTRAMUSCULAR | Status: AC
  Filled 2014-12-21: qty 30

## 2014-12-21 MED ORDER — SODIUM CHLORIDE 0.45 % IV SOLN
INTRAVENOUS | Status: DC
  Administered 2014-12-21: 19:00:00 via INTRAVENOUS

## 2014-12-21 SURGICAL SUPPLY — 38 items
BLADE SAW SGTL MED 73X18.5 STR (BLADE) IMPLANT
BNDG COHESIVE 4X5 TAN STRL (GAUZE/BANDAGES/DRESSINGS) ×3 IMPLANT
BNDG GAUZE ELAST 4 BULKY (GAUZE/BANDAGES/DRESSINGS) ×3 IMPLANT
COVER SURGICAL LIGHT HANDLE (MISCELLANEOUS) ×4 IMPLANT
DRAPE U-SHAPE 47X51 STRL (DRAPES) ×6 IMPLANT
DRSG ADAPTIC 3X8 NADH LF (GAUZE/BANDAGES/DRESSINGS) ×3 IMPLANT
DRSG PAD ABDOMINAL 8X10 ST (GAUZE/BANDAGES/DRESSINGS) ×4 IMPLANT
DURAPREP 26ML APPLICATOR (WOUND CARE) ×3 IMPLANT
ELECT REM PT RETURN 9FT ADLT (ELECTROSURGICAL) ×3
ELECTRODE REM PT RTRN 9FT ADLT (ELECTROSURGICAL) ×1 IMPLANT
GAUZE SPONGE 4X4 12PLY STRL (GAUZE/BANDAGES/DRESSINGS) ×3 IMPLANT
GLOVE BIO SURGEON STRL SZ 6.5 (GLOVE) ×3 IMPLANT
GLOVE BIO SURGEONS STRL SZ 6.5 (GLOVE) ×3
GLOVE BIOGEL PI IND STRL 7.5 (GLOVE) IMPLANT
GLOVE BIOGEL PI IND STRL 9 (GLOVE) ×1 IMPLANT
GLOVE BIOGEL PI INDICATOR 7.5 (GLOVE) ×6
GLOVE BIOGEL PI INDICATOR 9 (GLOVE) ×2
GLOVE SURG ORTHO 9.0 STRL STRW (GLOVE) ×3 IMPLANT
GOWN STRL REUS W/ TWL LRG LVL3 (GOWN DISPOSABLE) ×1 IMPLANT
GOWN STRL REUS W/ TWL XL LVL3 (GOWN DISPOSABLE) ×2 IMPLANT
GOWN STRL REUS W/TWL LRG LVL3 (GOWN DISPOSABLE) ×3
GOWN STRL REUS W/TWL XL LVL3 (GOWN DISPOSABLE) ×6
KIT BASIN OR (CUSTOM PROCEDURE TRAY) ×3 IMPLANT
KIT ROOM TURNOVER OR (KITS) ×3 IMPLANT
NDL HYPO 25GX1X1/2 BEV (NEEDLE) IMPLANT
NEEDLE HYPO 25GX1X1/2 BEV (NEEDLE) ×3 IMPLANT
NS IRRIG 1000ML POUR BTL (IV SOLUTION) ×3 IMPLANT
PACK ORTHO EXTREMITY (CUSTOM PROCEDURE TRAY) ×3 IMPLANT
PAD ARMBOARD 7.5X6 YLW CONV (MISCELLANEOUS) ×6 IMPLANT
SPONGE GAUZE 4X4 12PLY STER LF (GAUZE/BANDAGES/DRESSINGS) ×2 IMPLANT
SPONGE LAP 18X18 X RAY DECT (DISPOSABLE) ×5 IMPLANT
STOCKINETTE IMPERVIOUS LG (DRAPES) IMPLANT
SUT ETHILON 2 0 PSLX (SUTURE) ×6 IMPLANT
SYR CONTROL 10ML LL (SYRINGE) ×2 IMPLANT
TOWEL OR 17X24 6PK STRL BLUE (TOWEL DISPOSABLE) ×3 IMPLANT
TOWEL OR 17X26 10 PK STRL BLUE (TOWEL DISPOSABLE) ×3 IMPLANT
UNDERPAD 30X30 INCONTINENT (UNDERPADS AND DIAPERS) ×3 IMPLANT
WATER STERILE IRR 1000ML POUR (IV SOLUTION) ×3 IMPLANT

## 2014-12-21 NOTE — Progress Notes (Signed)
Patient returned from PACU in stable condition. Alert and oriented. Denies pain at this time. Surgical dressing to right foot clean, dry, and intact. Dressing to L foot changed. Encouraged to call nurse for pain medicine if needed.

## 2014-12-21 NOTE — Transfer of Care (Signed)
Immediate Anesthesia Transfer of Care Note  Patient: [name]  Procedure(s) Performed: Procedure(s): AMPUTATION RAY/RIGHT (Right)  Patient Location: PACU  Anesthesia Type:General  Level of Consciousness: lethargic and responds to stimulation  Airway & Oxygen Therapy: Patient Spontanous Breathing and Patient connected to nasal cannula oxygen  Post-op Assessment: Report given to RN  Post vital signs: Reviewed and stable  Last Vitals:  Filed Vitals:   12/21/14 0547 12/21/14 1336  BP: 114/39 108/55  Pulse: 70 78  Temp: 36.8 C 36.7 C  Resp: 20 20    Complications: No apparent anesthesia complications

## 2014-12-21 NOTE — Progress Notes (Signed)
PROGRESS NOTE  Random L Bard VWU:[id number] DOB: 1920-08-08 DOA: GEFFREY MICHAELSEN6 PCP: Lindwood Qua, MD  Brief History 81 year old male with a history of abuse mellitus, hypertension, coronary artery disease, sick sinus syndrome status post pacemaker, hyperlipidemia presented with nonhealing wound on his right great toe. The patient has been going to the wound care center at [facility] for the past month to 2 months. The patient has been on a number of antibiotic regimens and has had a number of debridements at the wound care center. His last antibody regimen with levofloxacin which he finished 2-3 weeks ago. Unfortunately, his right great toe continues to worsen with increasing necrotic tissue. [facility], he was told to come to the emergency department for further evaluation.  Assessment/Plan: Diabetic foot infection/osteomyelitis right great toe -The patient has necrotic tissue with exposed bone on examination -Case was discussed with the patient's plastic surgeon (Dr. Marzetta Board) who felt pt needs ortho -Dr. Lajoyce Corners consulted and the plan is for amputation of his right great toe later today 11/30 -continue IV abx for now -will will have physical therapy/occupational therapy evaluation patient capacity for activities of daily living and need of assistance after amputation.  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  Coronary artery disease with history of CABG -Continue aspirin -No anginal symptoms -Continue metoprolol tartrate and statin  CKD stage III -Baseline creatinine 1.5-1.7 -Stable. -Will follow renal function trend  Sick sinus syndrome/complete heart block -Status post  permanent pacemaker -Stable -Follows Dr. Johney Frame  Peripheral vascular disease  -status post intervention in early part of this year for the left lower extremity with chronic nonhealing wound of the left lower extremity - continue aspirin.  CODE STATUS: DNR Family Communication:   Son updated  at bedside Disposition Plan:   My require skilled nursing facility for wound care and rehabilitation after right amputation (right foot).   Procedures/Studies: Dg Foot Complete Right  2015-01-08  CLINICAL DATA:  Wound and infection at the superior right great toe, status post fall 1 month ago. Initial encounter. EXAM: RIGHT FOOT COMPLETE - 3+ VIEW COMPARISON:  None. FINDINGS: There is no evidence of fracture or dislocation. No definite osseous erosions are seen. The known soft tissue ulceration is difficult to fully characterize on radiograph. No radiopaque foreign bodies are identified. The joint spaces are preserved. There is no evidence of talar subluxation; the subtalar joint is unremarkable in appearance. Scattered vascular calcifications are seen. IMPRESSION: No evidence of fracture or dislocation. No definite osseous erosion seen. No radiopaque foreign bodies identified. Scattered vascular calcifications seen. Electronically Signed   By: Roanna Raider M.D.   On: January 08, 2015 21:10     Subjective: Patient denies fevers, chest pain, shortness of breath, abdominal pain, nausea/vomiting or any other acute complaints. He is ready for surgery later today and reports that since he lives alone is in agreement to go if needed to skilled nursing facility for wound care and rehabilitation.   Objective: Filed Vitals:   12/20/14 2125 12/20/14 2255 12/21/14 0547 12/21/14 1336  BP: 125/44 133/53 114/39 108/55  Pulse: 70 81 70 78  Temp: 98.1 F (36.7 C)  98.2 F (36.8 C) 98 F (36.7 C)  TempSrc: Oral  Oral Oral  Resp: 18  20 20  Height:      Weight:      SpO2: 99%  97% 100%    Intake/Output Summary  (Last 24 hours) at 12/21/14 1712 Last data filed at 12/21/14 1654  Gross per 24 hour  Intake    400 ml  Output   1050 ml  Net   -650 ml   Weight change:  Exam:   General:  Pt is alert, follows commands appropriately, not in acute distress; afebrile. Reports no significant pain on his foot.  HEENT: No icterus, No thrush, No neck mass, [place]/AT  Cardiovascular: RRR, S1/S2, no rubs, no gallops  Respiratory: CTA bilaterally, no wheezing, no crackles, no rhonchi  Abdomen: Soft/+BS, non tender, non distended, no guarding; no hepatosplenomegaly  Extremities: 1+LE edema bilaterally, right hallux with necrotic tissue and palpable bone. Small amount of purulent drainage.   Data Reviewed: Basic Metabolic Panel:  Recent Labs Lab 12/19/14 1658 12/20/14 0621 12/21/14 0515  NA 132* 140 135  K 4.8 4.4 4.3  CL 98* 105 101  CO2 26 27 25   GLUCOSE 163* 128* 84  BUN 18 16 23*  CREATININE 1.53* 1.58* 1.90*  CALCIUM 8.2* 8.2* 7.9*   Liver Function Tests:  Recent Labs Lab 12/19/14 1658 12/20/14 0621  AST 24 19  ALT 31 23  ALKPHOS 82 65  BILITOT 0.3 0.1*  PROT 7.3 5.9*  ALBUMIN 2.9* 2.3*   CBC:  Recent Labs Lab 12/19/14 1658 12/20/14 0621  WBC 9.3 7.7  NEUTROABS  --  5.1  HGB 11.5* 10.1*  HCT 37.1* 32.3*  MCV 85.3 85.4  PLT 288 239   CBG:  Recent Labs Lab 12/20/14 1815 12/20/14 2140 12/21/14 0805 12/21/14 1159 12/21/14 1544  GLUCAP 81 99 80 79 86    Recent Results (from the past 240 hour(s))  Surgical pcr screen     Status: Abnormal   Collection Time: 12/20/14 11:34 PM  Result Value Ref Range Status   MRSA, PCR POSITIVE (A) NEGATIVE Final   Staphylococcus aureus POSITIVE (A) NEGATIVE Final    Comment:        The Xpert SA Assay (FDA approved for NASAL specimens in patients over 79 years of age), is one component of a comprehensive surveillance program.  Test performance has been validated by [facility] for patients greater than or equal to 1 year  old. It is not intended to diagnose infection nor to guide or monitor treatment.      Scheduled Meds: . [MAR Hold] aspirin EC  81 mg Oral Daily  . [MAR Hold] Chlorhexidine Gluconate Cloth  6 each Topical Q0600  . [MAR Hold] furosemide  40 mg Oral Daily  . [MAR Hold] insulin aspart  0-9 Units Subcutaneous TID WC  . [MAR Hold] metoprolol tartrate  25 mg Oral BID  . [MAR Hold] mupirocin ointment  1 application Nasal BID  . [MAR Hold] piperacillin-tazobactam (ZOSYN)  IV  3.375 g Intravenous 3 times per day  . [MAR Hold] simvastatin  40 mg Oral QPM  . [MAR Hold] sodium chloride  3 mL Intravenous Q12H  . [MAR Hold] tamsulosin  0.4 mg Oral QPM  . [MAR Hold] vancomycin  1,000 mg Intravenous Q48H   Continuous Infusions:    Vassie LollMadera, Dashton Czerwinski, MD  [facility] Pager [phone]  If 7PM-7AM, please contact night-coverage [email or web address] Password [email or web address] 12/21/2014, 5:12 PM   LOS: 2 days

## 2014-12-21 NOTE — H&P (View-Only) (Signed)
Reason for Consult: Osteomyelitis ulceration abscess right great toe Referring Physician: Dr. [name] Stall is an 81 y.o. male.  HPI: Patient is a 81 year old gentleman with diabetic insensate neuropathy who presents with ulceration and cellulitis with exposed bone of the great toe right foot. Patient states the ulcers been present for several weeks and getting worse quickly.  Past Medical History  Diagnosis Date  . Hypertension   . Hyperlipidemia   . Diabetes mellitus   . Coronary artery disease     s/p CABG 1996  . Sick sinus syndrome ([place])     s/p PPM 2001 (R sided),  RV lead fracture 10/12 with new system (MDT) placed on L side due to R sided venous occlusion  . Diabetic neuropathy ([place])   . CVD (cardiovascular disease)     s/p CEA  . Peripheral vascular disease ([place])   . Chronic kidney disease   . BPH (benign prostatic hyperplasia)   . Hypercholesteremia   . Myocardial infarction ([place])   . CHF (congestive heart failure) ([place])   . Ulcer of toe ([place])   . Ulcer of heel and midfoot [facility])     Past Surgical History  Procedure Laterality Date  . Coronary artery bypass graft    . Carotid endarterectomy    . Pacemaker insertion      s/p PPM 2001 (R sided),  RV lead fracture 10/12 with new system (MDT) placed on Left side due to R sided venous occlusion  . Hernia repair    . Cataract extraction w/ intraocular lens implant Bilateral   . Insert / replace / remove pacemaker    . Wound debridement Left 07/28/2014    Procedure: DEBRIDEMENT WOUND/LEFT HEEL DEBRIDMENT;  Surgeon: Algernon Huxley, MD;  Location: [facility] ORS;  Service: Vascular;  Laterality: Left;  . Application of wound vac Left 07/28/2014    Procedure: APPLICATION OF WOUND VAC;  Surgeon: Algernon Huxley, MD;  Location: [facility] ORS;  Service: Vascular;  Laterality: Left;    Family History  Problem Relation Age of Onset  . CAD Brother     Social History:  reports that he quit smoking about 39 years ago. He has never used  smokeless tobacco. He reports that he does not drink alcohol or use illicit drugs.  Allergies:  Allergies  Allergen Reactions  . Ace Inhibitors     Kidney failure  . Ciprofloxacin Rash    Medications: I have reviewed the patient's current medications.  Results for orders placed or performed during the hospital encounter of 12/19/14 (from the past 48 hour(s))  Comprehensive metabolic panel     Status: Abnormal   Collection Time: 12/19/14  4:58 PM  Result Value Ref Range   Sodium 132 (L) 135 - 145 mmol/L   Potassium 4.8 3.5 - 5.1 mmol/L   Chloride 98 (L) 101 - 111 mmol/L   CO2 26 22 - 32 mmol/L   Glucose, Bld 163 (H) 65 - 99 mg/dL   BUN 18 6 - 20 mg/dL   Creatinine, Ser 1.53 (H) 0.61 - 1.24 mg/dL   Calcium 8.2 (L) 8.9 - 10.3 mg/dL   Total Protein 7.3 6.5 - 8.1 g/dL   Albumin 2.9 (L) 3.5 - 5.0 g/dL   AST 24 15 - 41 U/L   ALT 31 17 - 63 U/L   Alkaline Phosphatase 82 38 - 126 U/L   Total Bilirubin 0.3 0.3 - 1.2 mg/dL   GFR calc non Af Amer 38 (L) >60 mL/min  GFR calc Af Amer 44 (L) >60 mL/min    Comment: (NOTE) The eGFR has been calculated using the CKD EPI equation. This calculation has not been validated in all clinical situations. eGFR's persistently <60 mL/min signify possible Chronic Kidney Disease.    Anion gap 8 5 - 15  CBC     Status: Abnormal   Collection Time: 12/19/14  4:58 PM  Result Value Ref Range   WBC 9.3 4.0 - 10.5 K/uL   RBC 4.35 4.22 - 5.81 MIL/uL   Hemoglobin 11.5 (L) 13.0 - 17.0 g/dL   HCT 37.1 (L) 39.0 - 52.0 %   MCV 85.3 78.0 - 100.0 fL   MCH 26.4 26.0 - 34.0 pg   MCHC 31.0 30.0 - 36.0 g/dL   RDW 15.4 11.5 - 15.5 %   Platelets 288 150 - 400 K/uL  Glucose, capillary     Status: Abnormal   Collection Time: 12/20/14 12:51 AM  Result Value Ref Range   Glucose-Capillary 210 (H) 65 - 99 mg/dL  Comprehensive metabolic panel     Status: Abnormal   Collection Time: 12/20/14  6:21 AM  Result Value Ref Range   Sodium 140 135 - 145 mmol/L   Potassium  4.4 3.5 - 5.1 mmol/L   Chloride 105 101 - 111 mmol/L   CO2 27 22 - 32 mmol/L   Glucose, Bld 128 (H) 65 - 99 mg/dL   BUN 16 6 - 20 mg/dL   Creatinine, Ser 1.58 (H) 0.61 - 1.24 mg/dL   Calcium 8.2 (L) 8.9 - 10.3 mg/dL   Total Protein 5.9 (L) 6.5 - 8.1 g/dL   Albumin 2.3 (L) 3.5 - 5.0 g/dL   AST 19 15 - 41 U/L   ALT 23 17 - 63 U/L   Alkaline Phosphatase 65 38 - 126 U/L   Total Bilirubin 0.1 (L) 0.3 - 1.2 mg/dL   GFR calc non Af Amer 37 (L) >60 mL/min   GFR calc Af Amer 42 (L) >60 mL/min    Comment: (NOTE) The eGFR has been calculated using the CKD EPI equation. This calculation has not been validated in all clinical situations. eGFR's persistently <60 mL/min signify possible Chronic Kidney Disease.    Anion gap 8 5 - 15  CBC WITH DIFFERENTIAL     Status: Abnormal   Collection Time: 12/20/14  6:21 AM  Result Value Ref Range   WBC 7.7 4.0 - 10.5 K/uL   RBC 3.78 (L) 4.22 - 5.81 MIL/uL   Hemoglobin 10.1 (L) 13.0 - 17.0 g/dL   HCT 32.3 (L) 39.0 - 52.0 %   MCV 85.4 78.0 - 100.0 fL   MCH 26.7 26.0 - 34.0 pg   MCHC 31.3 30.0 - 36.0 g/dL   RDW 15.3 11.5 - 15.5 %   Platelets 239 150 - 400 K/uL   Neutrophils Relative % 66 %   Neutro Abs 5.1 1.7 - 7.7 K/uL   Lymphocytes Relative 19 %   Lymphs Abs 1.5 0.7 - 4.0 K/uL   Monocytes Relative 12 %   Monocytes Absolute 0.9 0.1 - 1.0 K/uL   Eosinophils Relative 3 %   Eosinophils Absolute 0.2 0.0 - 0.7 K/uL   Basophils Relative 0 %   Basophils Absolute 0.0 0.0 - 0.1 K/uL  Glucose, capillary     Status: Abnormal   Collection Time: 12/20/14  7:52 AM  Result Value Ref Range   Glucose-Capillary 124 (H) 65 - 99 mg/dL  Glucose, capillary     Status:  Abnormal   Collection Time: 12/20/14 11:40 AM  Result Value Ref Range   Glucose-Capillary 132 (H) 65 - 99 mg/dL  Sedimentation rate     Status: Abnormal   Collection Time: 12/20/14 12:45 PM  Result Value Ref Range   Sed Rate 67 (H) 0 - 16 mm/hr  C-reactive protein     Status: Abnormal    Collection Time: 12/20/14 12:45 PM  Result Value Ref Range   CRP 7.5 (H) <1.0 mg/dL  Glucose, capillary     Status: Abnormal   Collection Time: 12/20/14  5:42 PM  Result Value Ref Range   Glucose-Capillary 60 (L) 65 - 99 mg/dL    Dg Foot Complete Right  12/19/2014  CLINICAL DATA:  Wound and infection at the superior right great toe, status post fall 1 month ago. Initial encounter. EXAM: RIGHT FOOT COMPLETE - 3+ VIEW COMPARISON:  None. FINDINGS: There is no evidence of fracture or dislocation. No definite osseous erosions are seen. The known soft tissue ulceration is difficult to fully characterize on radiograph. No radiopaque foreign bodies are identified. The joint spaces are preserved. There is no evidence of talar subluxation; the subtalar joint is unremarkable in appearance. Scattered vascular calcifications are seen. IMPRESSION: No evidence of fracture or dislocation. No definite osseous erosion seen. No radiopaque foreign bodies identified. Scattered vascular calcifications seen. Electronically Signed   By: Garald Balding M.D.   On: 12/19/2014 21:10    Review of Systems  All other systems reviewed and are negative.  Blood pressure 96/45, pulse 70, temperature 98 F (36.7 C), temperature source Oral, resp. rate 20, height 5' 9"  (1.753 m), weight 88.451 kg (195 lb), SpO2 96 %. Physical Exam On examination patient does have a palpable dorsalis pedis pulse he has cellulitis to the midfoot. There is exposed bone at the IP joint of the right great toe with necrotic tissue involving the entire dorsum of the great toe. Radiographs do not show any bony abnormalities. Assessment/Plan: Assessment: Osteomyelitis ulceration abscess and cellulitis right great toe.  Plan: Patient does not have any toe salvage options. Recommend proceeding with a first ray amputation. Risks and benefits were discussed including risk of the wound not healing. Patient states he understands wishes to proceed at this time  we will plan for surgery tomorrow Wednesday at 4 PM.  [name],[name] 12/20/2014, 6:09 PM

## 2014-12-21 NOTE — Interval H&P Note (Signed)
History and Physical Interval Note:  12/21/2014 6:22 AM  [name]  has presented today for surgery, with the diagnosis of Infected Right Foot  The various methods of treatment have been discussed with the patient and family. After consideration of risks, benefits and other options for treatment, the patient has consented to  Procedure(s): AMPUTATION RAY/RIGHT (Right) as a surgical intervention .  The patient's history has been reviewed, patient examined, no change in status, stable for surgery.  I have reviewed the patient's chart and labs.  Questions were answered to the patient's satisfaction.     DUDA,MARCUS V

## 2014-12-21 NOTE — Anesthesia Preprocedure Evaluation (Signed)
Anesthesia Evaluation  Patient identified by MRN, date of birth, ID band Patient awake    Reviewed: Allergy & Precautions, NPO status , Patient's Chart, lab work & pertinent test results  Airway Mallampati: II  TM Distance: >3 FB Neck ROM: Full    Dental no notable dental hx.    Pulmonary former smoker,    Pulmonary exam normal breath sounds clear to auscultation       Cardiovascular hypertension, + CAD, + Past MI, + Peripheral Vascular Disease and +CHF  Normal cardiovascular exam+ dysrhythmias + pacemaker  Rhythm:Regular Rate:Normal     Neuro/Psych negative neurological ROS  negative psych ROS   GI/Hepatic negative GI ROS, Neg liver ROS,   Endo/Other  diabetes  Renal/GU   negative genitourinary   Musculoskeletal negative musculoskeletal ROS (+)   Abdominal   Peds negative pediatric ROS (+)  Hematology negative hematology ROS (+)   Anesthesia Other Findings   Reproductive/Obstetrics negative OB ROS                             Anesthesia Physical Anesthesia Plan  ASA: III  Anesthesia Plan: MAC   Post-op Pain Management:    Induction: Intravenous  Airway Management Planned: Simple Face Mask  Additional Equipment:   Intra-op Plan:   Post-operative Plan: Extubation in OR  Informed Consent: I have reviewed the patients History and Physical, chart, labs and discussed the procedure including the risks, benefits and alternatives for the proposed anesthesia with the patient or authorized representative who has indicated his/her understanding and acceptance.   Dental advisory given  Plan Discussed with: CRNA and Surgeon  Anesthesia Plan Comments:         Anesthesia Quick Evaluation

## 2014-12-21 NOTE — Op Note (Signed)
12/19/2014 - 12/21/2014  4:56 PM  PATIENT:  [name]    PRE-OPERATIVE DIAGNOSIS:  Gangrene osteomyelitis and ulceration right great toe  POST-OPERATIVE DIAGNOSIS:  Same  PROCEDURE:  AMPUTATION RAY/RIGHT great toe  SURGEON:  DUDA,MARCUS V, MD  PHYSICIAN ASSISTANT:None ANESTHESIA:   General  PREOPERATIVE INDICATIONS:  [name] is a  81 y.o. male with a diagnosis of Infected Right Foot who failed conservative measures and elected for surgical management.    The risks benefits and alternatives were discussed with the patient preoperatively including but not limited to the risks of infection, bleeding, nerve injury, cardiopulmonary complications, the need for revision surgery, among others, and the patient was willing to proceed.  OPERATIVE IMPLANTS: None  OPERATIVE FINDINGS: Good petechial bleeding  OPERATIVE PROCEDURE: Patient was brought to the operating room and underwent a ankle block. Under Mack anesthetic patient's right lower extremity was prepped using DuraPrep draped into a sterile field. A timeout was called. Local anesthesia was then infiltrated just proximal to the amputation site. A racquet incision was made around the great toe and the great toe ulcer and first metatarsal were resected in 1 block of tissue. Electrocautery was used for hemostasis. There is no abscess no signs of any infection. Wound was irrigated with normal saline. The incision was closed using 2-0 nylon. A sterile compressive dressing was applied. Patient was taken to the PACU in stable condition.

## 2014-12-22 ENCOUNTER — Encounter (HOSPITAL_COMMUNITY): Payer: Self-pay | Admitting: Orthopedic Surgery

## 2014-12-22 DIAGNOSIS — N179 Acute kidney failure, unspecified: Secondary | ICD-10-CM

## 2014-12-22 DIAGNOSIS — I2510 Atherosclerotic heart disease of native coronary artery without angina pectoris: Secondary | ICD-10-CM

## 2014-12-22 LAB — GLUCOSE, CAPILLARY
Glucose-Capillary: 126 mg/dL — ABNORMAL HIGH (ref 65–99)
Glucose-Capillary: 165 mg/dL — ABNORMAL HIGH (ref 65–99)
Glucose-Capillary: 223 mg/dL — ABNORMAL HIGH (ref 65–99)
Glucose-Capillary: 176 mg/dL — ABNORMAL HIGH (ref 65–99)

## 2014-12-22 MED ORDER — SODIUM CHLORIDE 0.9 % IV SOLN
INTRAVENOUS | Status: AC
  Administered 2014-12-22: 16:00:00 via INTRAVENOUS

## 2014-12-22 NOTE — Care Management Important Message (Signed)
Important Message  Patient Details  Name: Sarina Illustin L Maiers MRN: [id number] Date of Birth: Oct 27, 1920   Medicare Important Message Given:  Yes    Lawerance Sabalebbie Deseree Zemaitis, RN 12/22/2014, 11:24 AMImportant Message  Patient Details  Name: Sarina Illustin L Rains MRN: [id number] Date of Birth: Oct 27, 1920   Medicare Important Message Given:  Yes    Lawerance Sabalebbie Kebin Maye, RN 12/22/2014, 11:24 AM

## 2014-12-22 NOTE — Progress Notes (Signed)
Patient is refusing insulin meal coverage.  Patient is concerned that it will cause him low blood sugar and he does not want to risk that.  Educated patient about insulin and meal coverage.  Paged MD to let him know patient is refusing insulin.  Patient stated that he takes glipizide at home.

## 2014-12-22 NOTE — Progress Notes (Signed)
Orthopedic Tech Progress Note Patient Details:  [name] Jul 25, 1920 [id number]  Ortho Devices Type of Ortho Device: Postop shoe/boot Ortho Device/Splint Location: rle Ortho Device/Splint Interventions: Application   [name] 12/22/2014, 8:33 AM

## 2014-12-22 NOTE — Progress Notes (Signed)
Physical Therapy Treatment Patient Details Name: COLLAN SCHOENFELD MRN: [id number] DOB: 08-03-1920 Today's Date: 12/22/2014    History of Present Illness 81 year old male with a history of abuse mellitus, hypertension, coronary artery disease, sick sinus syndrome status post pacemaker, hyperlipidemia presented with nonhealing wound on his right great toe.     PT Comments    Pt has new information to limit WB on R foot pending possible amputation and is quite weak with poor control of trunk and UE's to assist mobility.  Used bed pad to slide up bed on hips as he cannot assist with LE's well, even to scoot hips.  Follow Up Recommendations  SNF;Other (comment) (especially with plan for RLE surgery)     Equipment Recommendations  None recommended by PT (await SNF recommendations)    Recommendations for Other Services OT consult     Precautions / Restrictions Precautions Precautions: Fall Precaution Comments: Lt heel wound Required Braces or Orthoses: Other Brace/Splint Other Brace/Splint: darco shoe, Lt Restrictions Weight Bearing Restrictions: Yes (NWB RLE) Other Position/Activity Restrictions: R cast boot     Mobility  Bed Mobility Overal bed mobility: Needs Assistance Bed Mobility: Supine to Sit;Sit to Supine     Supine to sit: Mod assist;HOB elevated Sit to supine: Mod assist;HOB elevated   General bed mobility comments: assist to support trunk to get off bed and help with trunk and LE's tor return  Transfers Overall transfer level: Needs assistance Equipment used: Rolling walker (2 wheeled) Transfers: Sit to/from Stand Sit to Stand: Max assist;From elevated surface (not able to WB on RLE) Stand pivot transfers:  (declined to try)       General transfer comment: Max to power up and cannot fully stand due to limitations of RLE  Ambulation/Gait             General Gait Details: cannot attempt   Stairs            Wheelchair Mobility    Modified  Rankin (Stroke Patients Only)       Balance Overall balance assessment: Needs assistance Sitting-balance support: Single extremity supported;Bilateral upper extremity supported Sitting balance-Leahy Scale: Fair     Standing balance support: Bilateral upper extremity supported Standing balance-Leahy Scale: Poor                      Cognition                            Exercises      General Comments General comments (skin integrity, edema, etc.): Cannot use R foot due to wound instructions      Pertinent Vitals/Pain      Home Living Family/patient expects to be discharged to:: Private residence Living Arrangements: Alone Available Help at Discharge:  (CNA 3x per week)                Prior Function Level of Independence: Needs assistance          PT Goals (current goals can now be found in the care plan section) Acute Rehab PT Goals Patient Stated Goal: None stated    Frequency  Min 3X/week    PT Plan      Co-evaluation             End of Session Equipment Utilized During Treatment: Gait belt Activity Tolerance: Patient limited by pain;Patient limited by fatigue Patient left: in bed;with call bell/phone within reach;with bed alarm set  Time: [id number] PT Time Calculation (min) (ACUTE ONLY): 32 min  Charges:  $Therapeutic Activity: 23-37 mins                    G Codes:      Ivar DrapeStout, Niara Bunker E 12/22/2014, 3:09 PM   Samul Dadauth Dynasty Holquin, PT MS Acute Rehab Dept. Number: [facility] [phone] and [facility] [phone]

## 2014-12-22 NOTE — Anesthesia Postprocedure Evaluation (Signed)
Anesthesia Post Note  Patient: [name]  Procedure(s) Performed: Procedure(s) (LRB): AMPUTATION RAY/RIGHT (Right)  Patient location during evaluation: PACU Anesthesia Type: MAC Level of consciousness: awake and alert Pain management: pain level controlled Vital Signs Assessment: post-procedure vital signs reviewed and stable Respiratory status: spontaneous breathing, nonlabored ventilation, respiratory function stable and patient connected to nasal cannula oxygen Cardiovascular status: blood pressure returned to baseline and stable Postop Assessment: no signs of nausea or vomiting Anesthetic complications: no    Last Vitals:  Filed Vitals:   12/21/14 2224 12/22/14 0648  BP: 127/74 127/49  Pulse: 70 70  Temp: 36.7 C 37 C  Resp: 18 22    Last Pain:  Filed Vitals:   12/22/14 0648  PainSc: Asleep                 Corleen Otwell S

## 2014-12-22 NOTE — Progress Notes (Signed)
Patient ID: [name] L [place], male   DOB: September 10, 1920, 81 y.o.   MRN: [id number] Postoperative day 1 right foot first ray amputation. Patient without complaints of pain, dressing clean and dry. Patient states he had difficulty standing prior to surgery. Patient agrees to placement in skilled nursing.

## 2014-12-22 NOTE — Progress Notes (Signed)
PROGRESS NOTE  [name] L Mckinnon VHQ:[id number] DOB: May 16, 1920 DOA: 12/19/2014 PCP: [name],[name], MD  Brief History 81 year old male with a history of abuse mellitus, hypertension, coronary artery disease, sick sinus syndrome status post pacemaker, hyperlipidemia presented with nonhealing wound on his right great toe. The patient has been going to the wound care center at [facility] for the past month to 2 months. The patient has been on a number of antibiotic regimens and has had a number of debridements at the wound care center. His last antibody regimen with levofloxacin which he finished 2-3 weeks ago. Unfortunately, his right great toe continues to worsen with increasing necrotic tissue. When he presented to the [facility] yesterday, he was told to come to the emergency department for further evaluation.  Assessment/Plan: Diabetic foot infection/osteomyelitis right great toe -The patient has necrotic tissue with exposed bone on examination -Case was discussed with the patient's plastic surgeon (Dr. Marzetta Boardhimappa) who felt pt needs ortho -Dr. Lajoyce Cornersuda consulted and the patient is s/p amputation of his right great toe (11/30) -continue IV abx for another 24 hours; then will switch to PO abx's for 5 more days -will follow any further rec's from orthopedic service  -most likely SNF at discharge for rehab and wound care -will will have physical therapy/occupational therapy evaluation patient capacity for activities of daily living and needs of assistance after amputation.  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  Coronary artery disease with history of CABG -Continue aspirin -No anginal symptoms -Continue metoprolol tartrate  and statin  Mild acute on CKD stage III -Baseline creatinine 1.5-1.7 -will give gentle IVF resuscitation and follow BMET in am  Sick sinus syndrome/complete heart block -Status post permanent pacemaker -Stable -Follows Dr. Johney FrameAllred  Peripheral vascular disease  -status post intervention in early part of this year for the left lower extremity with chronic nonhealing wound of the left lower extremity - continue aspirin.  CODE STATUS: DNR Family Communication:   Son updated  at bedside Disposition Plan:   My require skilled nursing facility for wound care and rehabilitation after right amputation (right foot).   Procedures/Studies: Dg Foot Complete Right  12/19/2014  CLINICAL DATA:  Wound and infection at the superior right great toe, status post fall 1 month ago. Initial encounter. EXAM: RIGHT FOOT COMPLETE - 3+ VIEW COMPARISON:  None. FINDINGS: There is no evidence of fracture or dislocation. No definite osseous erosions are seen. The known soft tissue ulceration is difficult to fully characterize on radiograph. No radiopaque foreign bodies are identified. The joint spaces are preserved. There is no evidence of talar subluxation; the subtalar joint is unremarkable in appearance. Scattered vascular calcifications are seen. IMPRESSION: No evidence of fracture or dislocation. No definite osseous erosion seen. No radiopaque foreign bodies identified. Scattered vascular calcifications seen. Electronically Signed   By: Roanna RaiderJeffery  Chang M.D.   On: 12/19/2014 21:10     Subjective: Patient denies fevers, chest pain, shortness of breath, abdominal pain, nausea/vomiting or any other acute complaints. He is having some pain on his right foot.   Objective: Filed Vitals:   12/21/14 1821 12/21/14 2224 12/22/14 0648 12/22/14 1028  BP:  127/74 127/49 133/42  Pulse:  70 70 70  Temp: 97.2 F (36.2 C) 98 F (36.7 C) 98.6 F (  37 C)   TempSrc:  Oral Oral   Resp:  Height:      Weight:       SpO2:  98% 96% 96%    Intake/Output Summary (Last 24 hours) at 12/22/14 1412 Last data filed at 12/22/14 4540  Gross per 24 hour  Intake    150 ml  Output    800 ml  Net   -650 ml   Weight change:  Exam:   General:  Pt is alert, follows commands appropriately, not in acute distress; afebrile. Reports mild [ain on his right foot. No CP and no SOB  HEENT: No icterus, No thrush, No neck mass, [place]/AT  Cardiovascular: RRR, S1/S2, no rubs, no gallops  Respiratory: CTA bilaterally, no wheezing, no crackles, no rhonchi  Abdomen: Soft/+BS, non tender, non distended, no guarding; no hepatosplenomegaly  Extremities: trace to 1+LE edema bilaterally, right foot with clean, dry and intact dressing in place. Reports mild pain.    Data Reviewed: Basic Metabolic Panel:  Recent Labs Lab 12/19/14 1658 12/20/14 0621 12/21/14 0515 12/21/14 1958  NA 132* 140 135 136  K 4.8 4.4 4.3 4.5  CL 98* 105 101 100*  CO2 GLUCOSE 163* 128* 84 112*  BUN 18 16 23* 19  CREATININE 1.53* 1.58* 1.90* 2.03*  CALCIUM 8.2* 8.2* 7.9* 8.0*   Liver Function Tests:  Recent Labs Lab 12/19/14 1658 12/20/14 0621 12/21/14 1958  AST ALT ALKPHOS 82 65 66  BILITOT 0.3 0.1* 0.8  PROT 7.3 5.9* 6.4*  ALBUMIN 2.9* 2.3* 2.4*   CBC:  Recent Labs Lab 12/19/14 1658 12/20/14 0621 12/21/14 1958  WBC 9.3 7.7 7.9  NEUTROABS  --  5.1 5.4  HGB 11.5* 10.1* 10.8*  HCT 37.1* 32.3* 35.5*  MCV 85.3 85.4 85.7  PLT 288 239 264   CBG:  Recent Labs Lab 12/21/14 1704 12/21/14 1841 12/21/14 2157 12/22/14 0811 12/22/14 1206  GLUCAP 96 94 176* 126* 165*    Recent Results (from the past 240 hour(s))  Surgical pcr screen     Status: Abnormal   Collection Time: 12/20/14 11:34 PM  Result Value Ref Range Status   MRSA, PCR POSITIVE (A) NEGATIVE Final   Staphylococcus aureus POSITIVE (A) NEGATIVE Final    Comment:        The Xpert SA Assay (FDA approved for NASAL specimens in  patients over 74 years of age), is one component of a comprehensive surveillance program.  Test performance has been validated by [facility] for patients greater than or equal to 52 year old. It is not intended to diagnose infection nor to guide or monitor treatment.      Scheduled Meds: . aspirin EC  81 mg Oral Daily  . Chlorhexidine Gluconate Cloth  6 each Topical Q0600  . furosemide  40 mg Oral Daily  . insulin aspart  0-9 Units Subcutaneous TID WC  . metoprolol tartrate  25 mg Oral BID  . mupirocin ointment  1 application Nasal BID  . piperacillin-tazobactam (ZOSYN)  IV  3.375 g Intravenous 3 times per day  . simvastatin  40 mg Oral QPM  . sodium chloride  3 mL Intravenous Q12H  . tamsulosin  0.4 mg Oral QPM  . vancomycin  1,000 mg Intravenous Q48H   Continuous Infusions: . sodium chloride       Vassie Loll, MD  [facility] Pager [phone]  If 7PM-7AM, please  contact night-coverage [email or web address] Password [email or web address] 12/22/2014, 2:12 PM   LOS: 3 days

## 2014-12-23 ENCOUNTER — Encounter (HOSPITAL_COMMUNITY): Payer: Medicare Other

## 2014-12-23 DIAGNOSIS — E11621 Type 2 diabetes mellitus with foot ulcer: Secondary | ICD-10-CM

## 2014-12-23 DIAGNOSIS — L97519 Non-pressure chronic ulcer of other part of right foot with unspecified severity: Secondary | ICD-10-CM

## 2014-12-23 LAB — GLUCOSE, CAPILLARY
GLUCOSE-CAPILLARY: 210 mg/dL — AB (ref 65–99)
Glucose-Capillary: 138 mg/dL — ABNORMAL HIGH (ref 65–99)

## 2014-12-23 LAB — BASIC METABOLIC PANEL
ANION GAP: 12 (ref 5–15)
BUN: 24 mg/dL — ABNORMAL HIGH (ref 6–20)
CALCIUM: 7.9 mg/dL — AB (ref 8.9–10.3)
CHLORIDE: 96 mmol/L — AB (ref 101–111)
CO2: 26 mmol/L (ref 22–32)
Creatinine, Ser: 1.89 mg/dL — ABNORMAL HIGH (ref 0.61–1.24)
GFR calc non Af Amer: 29 mL/min — ABNORMAL LOW (ref >60)
GFR, EST AFRICAN AMERICAN: 34 mL/min — AB (ref >60)
Glucose, Bld: 137 mg/dL — ABNORMAL HIGH (ref 65–99)
Potassium: 4.1 mmol/L (ref 3.5–5.1)
Sodium: 134 mmol/L — ABNORMAL LOW (ref 135–145)

## 2014-12-23 LAB — CBC
HEMATOCRIT: 31.4 % — AB (ref 39.0–52.0)
HEMOGLOBIN: 9.8 g/dL — AB (ref 13.0–17.0)
MCH: 26.3 pg (ref 26.0–34.0)
MCHC: 31.2 g/dL (ref 30.0–36.0)
MCV: 84.4 fL (ref 78.0–100.0)
Platelets: 265 10*3/uL (ref 150–400)
RBC: 3.72 MIL/uL — ABNORMAL LOW (ref 4.22–5.81)
RDW: 15.1 % (ref 11.5–15.5)
WBC: 9.6 10*3/uL (ref 4.0–10.5)

## 2014-12-23 MED ORDER — DOXYCYCLINE HYCLATE 100 MG PO TABS
100.0000 mg | ORAL_TABLET | Freq: Two times a day (BID) | ORAL | Status: DC
  Administered 2014-12-23: 100 mg via ORAL
  Filled 2014-12-23: qty 1

## 2014-12-23 MED ORDER — DOXYCYCLINE HYCLATE 100 MG PO TABS: 100.0000 mg | ORAL_TABLET | Freq: Two times a day (BID) | ORAL | Status: AC

## 2014-12-23 MED ORDER — HYDROCODONE-ACETAMINOPHEN 5-325 MG PO TABS: 1.0000 | ORAL_TABLET | Freq: Four times a day (QID) | ORAL | Status: DC | PRN

## 2014-12-23 MED ORDER — METHOCARBAMOL 500 MG PO TABS: 500.0000 mg | ORAL_TABLET | Freq: Three times a day (TID) | ORAL | Status: DC | PRN

## 2014-12-23 NOTE — Progress Notes (Signed)
[facility] Anticipated DC date: 12/23/14 Family notified: Son, Molly MaduroRobert Transport by: [facility]  CSW signing off.  Cristobal GoldmannNadia Khaleem Burchill, [place] Clinical Social Worker [phone]

## 2014-12-23 NOTE — Progress Notes (Signed)
Attempted to call report to [facility] but no answer.  Will try again

## 2014-12-23 NOTE — Clinical Social Work Placement (Signed)
   CLINICAL SOCIAL WORK PLACEMENT  NOTE  Date:  12/23/2014  Patient Details  Name: [name] L Korber MRN: [id number] Date of Birth: Apr 15, 1920  Clinical Social Work is seeking post-discharge placement for this patient at the Skilled  Nursing Facility level of care (*CSW will initial, date and re-position this form in  chart as items are completed):  Yes   Patient/family provided with [place] Clinical Social Work Department's list of facilities offering this level of care within the geographic area requested by the patient (or if unable, by the patient's family).  Yes   Patient/family informed of their freedom to choose among providers that offer the needed level of care, that participate in Medicare, Medicaid or managed care program needed by the patient, have an available bed and are willing to accept the patient.  Yes   Patient/family informed of [facility]'s ownership interest in [facility] and [facility], as well as of the fact that they are under no obligation to receive care at these facilities.  PASRR submitted to EDS on 12/20/14     PASRR number received on 12/20/14     Existing PASRR number confirmed on       FL2 transmitted to all facilities in geographic area requested by pt/family on 12/20/14     FL2 transmitted to all facilities within larger geographic area on       Patient informed that his/her managed care company has contracts with or will negotiate with certain facilities, including the following:        Yes   Patient/family informed of bed offers received.  Patient chooses bed [facility]     Physician recommends and patient chooses bed at      Patient to be transferred to [facility] on 12/23/14.  Patient to be transferred to facility by [facility]     Patient family notified on 12/23/14 of transfer.  Name of family member notified:   Sterling BigRobert, Son, [phone]     PHYSICIAN       Additional Comment:     _______________________________________________ Mearl LatinNadia S Nura Cahoon, LCSW 12/23/2014, 2:33 PM

## 2014-12-23 NOTE — Progress Notes (Signed)
Sarina IllAustin L Treadwell to be D/C'd Skilled nursing facility per MD order.  Discussed with the patient and all questions fully answered.  VSS, Skin clean, dry and intact without evidence of skin break down, no evidence of skin tears noted. IV catheter discontinued intact. Site without signs and symptoms of complications. Dressing and pressure applied.  An After Visit Summary was printed and given to the patient. Patient received prescription.  D/c education completed with patient/family including follow up instructions, medication list, d/c activities limitations if indicated, with other d/c instructions as indicated by MD - patient able to verbalize understanding, all questions fully answered.   Patient instructed to return to ED, call 911, or call MD for any changes in condition.   Patient escorted via stretcher, and D/C SNf via [facility].  Pura SpiceJessica K Edwards 12/23/2014 3:39 PM

## 2014-12-23 NOTE — Progress Notes (Signed)
Report called to Nurse at [facility] awaiting [facility] to pick patient up for transport.

## 2014-12-23 NOTE — Discharge Summary (Signed)
Physician Discharge Summary  [name] L Hubbs [id number] DOB: 10-Apr-1920 DOA: 12/19/2014  PCP: [name],[name], MD  Admit date: 12/19/2014 Discharge date: 12/23/2014  Time spent: 40 minutes  Recommendations for Outpatient Follow-up:  1. Follow-up in one week with orthopedic surgeon (Dr. Lajoyce Cornersuda) 2. Keep dressing changes clean/dry and intact until his follow-up visit 3. No weight bearing on operated foot 4. Keep right foot elevated as much as possible 5. Repeat CBC and basic metabolic panel in 1 week; to follow hemoglobin trend, electrolytes and renal function respectively  Discharge Diagnoses:  Acute osteomyelitis of toe of right foot (HCC) Diabetic ulcer of right foot associated with type 2 diabetes mellitus (HCC) Essential hypertension Sick sinus syndrome (HCC) DM (diabetes mellitus), type 2 with renal complications (HCC) CAD (coronary artery disease) Mild acute on CKD (chronic kidney disease) stage 3, GFR 30-59 ml/min Anemia of chronic disease with mild acute blood loss anemia  Discharge Condition: Stable and improved. Patient will be discharged to skilled nursing facility for further care and rehabilitation. Follow-up in one week with orthopedic surgeon (Dr. Lajoyce Cornersuda)  Diet recommendation: Heart healthy diet and low carbohydrates diet  Filed Weights   12/19/14 1641  Weight: 88.451 kg (195 lb)    History of present illness:  81 year old male with a history of abuse mellitus, hypertension, coronary artery disease, sick sinus syndrome status post pacemaker, hyperlipidemia presented with nonhealing wound on his right great toe. The patient has been going to the [facility] for the past month to 2 months. The patient has been on a number of antibiotic regimens and has had a number of debridements at the wound care center. His last antibody regimen with levofloxacin which he finished 2-3 weeks ago. Unfortunately, his right great toe continues to worsen with increasing  necrotic tissue. When he presented to the Wound Care Ctr. on 12/18/14, he was told to come to the emergency department for further evaluation.  Hospital Course:  Diabetic foot infection/osteomyelitis right great toe -The patient had necrotic tissue with exposed bone on examination -Case was discussed with the patient's plastic surgeon (Dr. Marzetta Boardhimappa) who felt pt needs orthopedic service evaluation and most likely amputation. -Dr. Lajoyce Cornersuda was consulted and the patient ended requiring amputation of his right great toe (11/30) -IV abx will continue for another 24 hours post amputation and then switch to PO abx's for 5 more days to complete antibiotic therapy -will follow any further rec's from orthopedic service [facility], no weight bearing, keep dressing dry/clean and intact until follow-up in one week with Dr. 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  Hypertension -Continue home antihypertensive regimen -Blood pressure is stable and well controlled.  Coronary artery disease with history of CABG -Continue aspirin -No anginal symptoms -Continue also metoprolol tartrate and statin -Advised to follow heart healthy diet  Mild acute on CKD stage III -Baseline creatinine 1.5-1.7 -Essentially resolved with fluid resuscitation -Attribute it to decreased perfusion around surgical procedure  Sick sinus syndrome/complete heart block -Status post permanent pacemaker -Stable -Follows Dr. Johney FrameAllred  Anemia of chronic disease with mild acute blood loss -Hemoglobin in the 9.8 range and is stable -No signs of overt bleeding -No need for transfusion at this moment -Follow hemoglobin trend as an  outpatient.  Peripheral vascular disease  -  status post intervention in early part of this year for the left lower extremity with chronic nonhealing wound of the left lower extremity - continue aspirin.  Procedures:  Status post right great toe amputation 12/21/99  Consultations:  Orthopedic service  Discharge Exam: Filed Vitals:   12/23/14 0829 12/23/14 1334  BP: 104/46 148/58  Pulse: 71 75  Temp:  98.8 F (37.1 C)  Resp: 16 20    General: Pt is alert, follows commands appropriately, not in acute distress; afebrile. Reports mild pain on his right foot. No CP and no SOB  HEENT: No icterus, No thrush, No neck mass, [place]/AT  Cardiovascular: RRR, S1/S2, no rubs, no gallops  Respiratory: CTA bilaterally, no wheezing, no crackles, no rhonchi  Abdomen: Soft/+BS, non tender, non distended, no guarding; no hepatosplenomegaly  Extremities: trace LE edema bilaterally, right foot with clean, dry and intact dressing in place. Reports mild pain.No cyanosis appreciated on the rest of his foot and toes.  Discharge Instructions   Discharge Instructions    Diet - low sodium heart healthy    Complete by:  As directed      Discharge instructions    Complete by:  As directed   Try to keep (right foot elevated as much as possible and do no bear weight on it) Keep covered dressing in place and follow-up with orthopedic surgeon (Dr. Lajoyce Corners) in 1 week Maintain adequate hydration Take medications as prescribed Follow heart healthy diet and modified carbohydrates diet. CBC and basic metabolic panels in 1 week to follow hemoglobin trend, electrolytes abnormalities and renal function.     Elevate operative extremity    Complete by:  As directed      Non weight bearing    Complete by:  As directed   Laterality:  right  Extremity:  Lower     Post-op shoe    Complete by:  As directed           Current Discharge Medication List    START taking these medications   Details  doxycycline  (VIBRA-TABS) 100 MG tablet Take 1 tablet (100 mg total) by mouth every 12 (twelve) hours.    methocarbamol (ROBAXIN) 500 MG tablet Take 1 tablet (500 mg total) by mouth every 8 (eight) hours as needed for muscle spasms. Qty: 20 tablet, Refills: 0      CONTINUE these medications which have CHANGED   Details  HYDROcodone-acetaminophen (NORCO) 5-325 MG tablet Take 1 tablet by mouth every 6 (six) hours as needed for severe pain. Qty: 20 tablet, Refills: 0      CONTINUE these medications which have NOT CHANGED   Details  aspirin 81 MG tablet Take 81 mg by mouth daily.    furosemide (LASIX) 40 MG tablet Take 40 mg by mouth daily.     glipiZIDE (GLUCOTROL) 5 MG tablet Take 5 mg by mouth 2 (two) times daily before a meal. PRN For blood sugars over 120    metoprolol tartrate (LOPRESSOR) 25 MG tablet Take 25 mg by mouth 2 (two) times daily.      Multiple Vitamins-Minerals (MENS MULTIVITAMIN PLUS PO) Take 1 tablet by mouth daily.    Probiotic Product (PHILLIPS COLON HEALTH PO) Take 1 capsule by mouth daily.    simvastatin (ZOCOR) 40 MG tablet Take 40 mg by mouth every evening.     Tamsulosin HCl (FLOMAX) 0.4 MG CAPS Take 0.4 mg by mouth every evening.        Allergies  Allergen Reactions  . Ace  Inhibitors     Kidney failure  . Ciprofloxacin Rash   Follow-up Information    Follow up with DUDA,MARCUS V, MD In 1 week.   Specialty:  Orthopedic Surgery   Contact information:   [address] [place] [address] [phone]       Follow up with [facility] .   Specialty:  Skilled Nursing Facility   Contact information:   [address] [place] [address] [phone]      The results of significant diagnostics from this hospitalization (including imaging, microbiology, ancillary and laboratory) are listed below for reference.    Significant Diagnostic Studies: Dg Foot Complete Right  01-09-15  CLINICAL DATA:  Wound and  infection at the superior right great toe, status post fall 1 month ago. Initial encounter. EXAM: RIGHT FOOT COMPLETE - 3+ VIEW COMPARISON:  None. FINDINGS: There is no evidence of fracture or dislocation. No definite osseous erosions are seen. The known soft tissue ulceration is difficult to fully characterize on radiograph. No radiopaque foreign bodies are identified. The joint spaces are preserved. There is no evidence of talar subluxation; the subtalar joint is unremarkable in appearance. Scattered vascular calcifications are seen. IMPRESSION: No evidence of fracture or dislocation. No definite osseous erosion seen. No radiopaque foreign bodies identified. Scattered vascular calcifications seen. Electronically Signed   By: Roanna Raider M.D.   On: 01-09-2015 21:10    Microbiology: Recent Results (from the past 240 hour(s))  Surgical pcr screen     Status: Abnormal   Collection Time: 12/20/14 11:34 PM  Result Value Ref Range Status   MRSA, PCR POSITIVE (A) NEGATIVE Final   Staphylococcus aureus POSITIVE (A) NEGATIVE Final    Comment:        The Xpert SA Assay (FDA approved for NASAL specimens in patients over 27 years of age), is one component of a comprehensive surveillance program.  Test performance has been validated by [facility] for patients greater than or equal to 82 year old. It is not intended to diagnose infection nor to guide or monitor treatment.      Labs: Basic Metabolic Panel:  Recent Labs Lab 01/09/15 1658 12/20/14 0621 12/21/14 0515 12/21/14 1958 12/23/14 0610  NA 132* 140 135 136 134*  K 4.8 4.4 4.3 4.5 4.1  CL 98* 105 101 100* 96*  CO2 GLUCOSE 163* 128* 84 112* 137*  BUN 18 16 23* 19 24*  CREATININE 1.53* 1.58* 1.90* 2.03* 1.89*  CALCIUM 8.2* 8.2* 7.9* 8.0* 7.9*   Liver Function Tests:  Recent Labs Lab 01/09/15 1658 12/20/14 0621 12/21/14 1958  AST ALT ALKPHOS 82 65 66  BILITOT 0.3 0.1* 0.8  PROT 7.3  5.9* 6.4*  ALBUMIN 2.9* 2.3* 2.4*   CBC:  Recent Labs Lab 09-Jan-2015 1658 12/20/14 0621 12/21/14 1958 12/23/14 0610  WBC 9.3 7.7 7.9 9.6  NEUTROABS  --  5.1 5.4  --   HGB 11.5* 10.1* 10.8* 9.8*  HCT 37.1* 32.3* 35.5* 31.4*  MCV 85.3 85.4 85.7 84.4  PLT 288 239 264 265   CBG:  Recent Labs Lab 12/22/14 1206 12/22/14 1726 12/22/14 2134 12/23/14 0813 12/23/14 1200  GLUCAP 165* 223* 176* 138* 210*    Signed:  Vassie Loll  [facility] 12/23/2014, 2:47 PM

## 2014-12-23 NOTE — Care Management Note (Signed)
Case Management Note  Patient Details  Name: Sarina Illustin L Mcgovern MRN: [id number] Date of Birth: Jan 26, 1920  Subjective/Objective:                 Patient admitted with [place] of toe. Had amputation.    Action/Plan:  Dc to SNF as facilitated through CSW.  Expected Discharge Date:                  Expected Discharge Plan:  Skilled Nursing Facility  In-House Referral:  Clinical Social Work  Discharge planning Services  CM Consult  Post Acute Care Choice:    Choice offered to:     DME Arranged:    DME Agency:     HH Arranged:    HH Agency:     Status of Service:  Completed, signed off  Medicare Important Message Given:  Yes Date Medicare IM Given:    Medicare IM give by:    Date Additional Medicare IM Given:    Additional Medicare Important Message give by:     If discussed at Long Length of Stay Meetings, dates discussed:    Additional Comments:  Lawerance SabalDebbie Teofila Bowery, RN 12/23/2014, 3:50 PM

## 2015-01-18 ENCOUNTER — Other Ambulatory Visit (HOSPITAL_BASED_OUTPATIENT_CLINIC_OR_DEPARTMENT_OTHER): Payer: Self-pay | Admitting: General Surgery

## 2015-01-18 ENCOUNTER — Encounter (HOSPITAL_BASED_OUTPATIENT_CLINIC_OR_DEPARTMENT_OTHER): Payer: Medicare Other | Attending: Surgery

## 2015-01-18 ENCOUNTER — Ambulatory Visit (HOSPITAL_COMMUNITY)
Admission: RE | Admit: 2015-01-18 | Discharge: 2015-01-18 | Disposition: A | Discharge status: Home / Self Care | Payer: Medicare Other | Source: Ambulatory Visit | Attending: General Surgery | Admitting: General Surgery

## 2015-01-18 DIAGNOSIS — L97429 Non-pressure chronic ulcer of left heel and midfoot with unspecified severity: Secondary | ICD-10-CM | POA: Insufficient documentation

## 2015-01-18 DIAGNOSIS — I2510 Atherosclerotic heart disease of native coronary artery without angina pectoris: Secondary | ICD-10-CM | POA: Diagnosis not present

## 2015-01-18 DIAGNOSIS — M869 Osteomyelitis, unspecified: Secondary | ICD-10-CM | POA: Insufficient documentation

## 2015-01-18 DIAGNOSIS — Z950 Presence of cardiac pacemaker: Secondary | ICD-10-CM | POA: Insufficient documentation

## 2015-01-18 DIAGNOSIS — I70244 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: Secondary | ICD-10-CM | POA: Diagnosis not present

## 2015-01-18 DIAGNOSIS — E11621 Type 2 diabetes mellitus with foot ulcer: Secondary | ICD-10-CM | POA: Insufficient documentation

## 2015-01-18 DIAGNOSIS — E1140 Type 2 diabetes mellitus with diabetic neuropathy, unspecified: Secondary | ICD-10-CM | POA: Insufficient documentation

## 2015-01-18 DIAGNOSIS — Z89411 Acquired absence of right great toe: Secondary | ICD-10-CM | POA: Insufficient documentation

## 2015-01-18 DIAGNOSIS — E1151 Type 2 diabetes mellitus with diabetic peripheral angiopathy without gangrene: Secondary | ICD-10-CM | POA: Diagnosis not present

## 2015-01-25 ENCOUNTER — Encounter (HOSPITAL_BASED_OUTPATIENT_CLINIC_OR_DEPARTMENT_OTHER): Payer: Medicare Other | Attending: Surgery

## 2015-01-25 ENCOUNTER — Other Ambulatory Visit: Payer: Self-pay | Admitting: Certified Registered Nurse Anesthetist

## 2015-01-25 DIAGNOSIS — L97421 Non-pressure chronic ulcer of left heel and midfoot limited to breakdown of skin: Secondary | ICD-10-CM | POA: Insufficient documentation

## 2015-01-25 DIAGNOSIS — E11621 Type 2 diabetes mellitus with foot ulcer: Secondary | ICD-10-CM | POA: Diagnosis not present

## 2015-01-25 DIAGNOSIS — Z86718 Personal history of other venous thrombosis and embolism: Secondary | ICD-10-CM | POA: Diagnosis not present

## 2015-01-25 DIAGNOSIS — L97521 Non-pressure chronic ulcer of other part of left foot limited to breakdown of skin: Secondary | ICD-10-CM | POA: Diagnosis not present

## 2015-01-25 DIAGNOSIS — I252 Old myocardial infarction: Secondary | ICD-10-CM | POA: Insufficient documentation

## 2015-01-25 DIAGNOSIS — E1152 Type 2 diabetes mellitus with diabetic peripheral angiopathy with gangrene: Secondary | ICD-10-CM | POA: Diagnosis not present

## 2015-01-25 DIAGNOSIS — Z89411 Acquired absence of right great toe: Secondary | ICD-10-CM | POA: Diagnosis not present

## 2015-01-25 DIAGNOSIS — I509 Heart failure, unspecified: Secondary | ICD-10-CM | POA: Insufficient documentation

## 2015-01-25 DIAGNOSIS — I70244 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: Secondary | ICD-10-CM | POA: Diagnosis not present

## 2015-01-25 DIAGNOSIS — I2510 Atherosclerotic heart disease of native coronary artery without angina pectoris: Secondary | ICD-10-CM | POA: Insufficient documentation

## 2015-01-25 DIAGNOSIS — M86372 Chronic multifocal osteomyelitis, left ankle and foot: Secondary | ICD-10-CM | POA: Insufficient documentation

## 2015-02-01 DIAGNOSIS — E11621 Type 2 diabetes mellitus with foot ulcer: Secondary | ICD-10-CM | POA: Diagnosis not present

## 2015-02-02 ENCOUNTER — Other Ambulatory Visit (HOSPITAL_COMMUNITY): Payer: Self-pay | Admitting: Orthopedic Surgery

## 2015-02-09 ENCOUNTER — Encounter (HOSPITAL_COMMUNITY): Payer: Self-pay | Admitting: *Deleted

## 2015-02-09 MED ORDER — CEFAZOLIN SODIUM-DEXTROSE 2-3 GM-% IV SOLR
2.0000 g | INTRAVENOUS | Status: AC
  Administered 2015-02-10: 2 g via INTRAVENOUS
  Filled 2015-02-09: qty 50

## 2015-02-09 MED ORDER — CHLORHEXIDINE GLUCONATE 4 % EX LIQD: 60.0000 mL | Freq: Once | CUTANEOUS | Status: DC

## 2015-02-09 NOTE — Progress Notes (Signed)
I spoke with patients nurse, Jerrye Beavers at [facility].  Jerrye Beavers reports that patient CBG is greater than 300 tonight and that patient is not on Insulin.  Patient d nurse could not tell me how CBGs have been recently.  I spoke with patients daughter, Reche Dixon , she will be transporting patient to the hospital in am.

## 2015-02-10 ENCOUNTER — Inpatient Hospital Stay (HOSPITAL_COMMUNITY): Payer: Medicare Other | Admitting: Certified Registered Nurse Anesthetist

## 2015-02-10 ENCOUNTER — Encounter (HOSPITAL_COMMUNITY)
Admission: RE | Disposition: A | Discharge status: Skilled Nursing Facility | Payer: Self-pay | Source: Ambulatory Visit | Attending: Orthopedic Surgery

## 2015-02-10 ENCOUNTER — Inpatient Hospital Stay (HOSPITAL_COMMUNITY)
Admission: RE | Admit: 2015-02-10 | Discharge: 2015-02-13 | DRG: 240 | Disposition: A | Discharge status: Skilled Nursing Facility | Payer: Medicare Other | Source: Ambulatory Visit | Attending: Orthopedic Surgery | Admitting: Orthopedic Surgery

## 2015-02-10 ENCOUNTER — Encounter (HOSPITAL_COMMUNITY): Payer: Self-pay | Admitting: Anesthesiology

## 2015-02-10 DIAGNOSIS — Z961 Presence of intraocular lens: Secondary | ICD-10-CM | POA: Diagnosis present

## 2015-02-10 DIAGNOSIS — R32 Unspecified urinary incontinence: Secondary | ICD-10-CM | POA: Diagnosis present

## 2015-02-10 DIAGNOSIS — Z89511 Acquired absence of right leg below knee: Secondary | ICD-10-CM

## 2015-02-10 DIAGNOSIS — T8781 Dehiscence of amputation stump: Secondary | ICD-10-CM | POA: Diagnosis present

## 2015-02-10 DIAGNOSIS — Z87891 Personal history of nicotine dependence: Secondary | ICD-10-CM

## 2015-02-10 DIAGNOSIS — I130 Hypertensive heart and chronic kidney disease with heart failure and stage 1 through stage 4 chronic kidney disease, or unspecified chronic kidney disease: Secondary | ICD-10-CM | POA: Diagnosis present

## 2015-02-10 DIAGNOSIS — I509 Heart failure, unspecified: Secondary | ICD-10-CM | POA: Diagnosis present

## 2015-02-10 DIAGNOSIS — Z9841 Cataract extraction status, right eye: Secondary | ICD-10-CM | POA: Diagnosis not present

## 2015-02-10 DIAGNOSIS — E1152 Type 2 diabetes mellitus with diabetic peripheral angiopathy with gangrene: Secondary | ICD-10-CM | POA: Diagnosis present

## 2015-02-10 DIAGNOSIS — N184 Chronic kidney disease, stage 4 (severe): Secondary | ICD-10-CM | POA: Diagnosis present

## 2015-02-10 DIAGNOSIS — E1122 Type 2 diabetes mellitus with diabetic chronic kidney disease: Secondary | ICD-10-CM | POA: Diagnosis present

## 2015-02-10 DIAGNOSIS — Z950 Presence of cardiac pacemaker: Secondary | ICD-10-CM

## 2015-02-10 DIAGNOSIS — E1140 Type 2 diabetes mellitus with diabetic neuropathy, unspecified: Secondary | ICD-10-CM | POA: Diagnosis present

## 2015-02-10 DIAGNOSIS — R339 Retention of urine, unspecified: Secondary | ICD-10-CM | POA: Diagnosis present

## 2015-02-10 DIAGNOSIS — I252 Old myocardial infarction: Secondary | ICD-10-CM

## 2015-02-10 DIAGNOSIS — E7800 Pure hypercholesterolemia, unspecified: Secondary | ICD-10-CM | POA: Diagnosis present

## 2015-02-10 DIAGNOSIS — Z9842 Cataract extraction status, left eye: Secondary | ICD-10-CM | POA: Diagnosis not present

## 2015-02-10 DIAGNOSIS — Z951 Presence of aortocoronary bypass graft: Secondary | ICD-10-CM | POA: Diagnosis not present

## 2015-02-10 DIAGNOSIS — N400 Enlarged prostate without lower urinary tract symptoms: Secondary | ICD-10-CM | POA: Diagnosis present

## 2015-02-10 DIAGNOSIS — L89629 Pressure ulcer of left heel, unspecified stage: Secondary | ICD-10-CM | POA: Diagnosis present

## 2015-02-10 DIAGNOSIS — I2510 Atherosclerotic heart disease of native coronary artery without angina pectoris: Secondary | ICD-10-CM | POA: Diagnosis present

## 2015-02-10 DIAGNOSIS — Z89519 Acquired absence of unspecified leg below knee: Secondary | ICD-10-CM

## 2015-02-10 DIAGNOSIS — M79672 Pain in left foot: Secondary | ICD-10-CM | POA: Diagnosis present

## 2015-02-10 DIAGNOSIS — L8990 Pressure ulcer of unspecified site, unspecified stage: Secondary | ICD-10-CM | POA: Insufficient documentation

## 2015-02-10 HISTORY — DX: Presence of cardiac pacemaker: Z95.0

## 2015-02-10 HISTORY — PX: AMPUTATION: SHX166

## 2015-02-10 LAB — SURGICAL PCR SCREEN
MRSA, PCR: NEGATIVE
Staphylococcus aureus: NEGATIVE

## 2015-02-10 LAB — COMPREHENSIVE METABOLIC PANEL
ALT: 35 U/L (ref 17–63)
AST: 28 U/L (ref 15–41)
Albumin: 2.1 g/dL — ABNORMAL LOW (ref 3.5–5.0)
Alkaline Phosphatase: 109 U/L (ref 38–126)
Anion gap: 17 — ABNORMAL HIGH (ref 5–15)
BUN: 25 mg/dL — AB (ref 6–20)
CO2: 24 mmol/L (ref 22–32)
CREATININE: 1.96 mg/dL — AB (ref 0.61–1.24)
Calcium: 8.5 mg/dL — ABNORMAL LOW (ref 8.9–10.3)
Chloride: 101 mmol/L (ref 101–111)
GFR calc Af Amer: 32 mL/min — ABNORMAL LOW (ref >60)
GFR, EST NON AFRICAN AMERICAN: 28 mL/min — AB (ref >60)
Glucose, Bld: 183 mg/dL — ABNORMAL HIGH (ref 65–99)
Potassium: 4.6 mmol/L (ref 3.5–5.1)
Sodium: 142 mmol/L (ref 135–145)
Total Bilirubin: 0.4 mg/dL (ref 0.3–1.2)
Total Protein: 6.5 g/dL (ref 6.5–8.1)

## 2015-02-10 LAB — CBC
HCT: 38.6 % — ABNORMAL LOW (ref 39.0–52.0)
Hemoglobin: 11.8 g/dL — ABNORMAL LOW (ref 13.0–17.0)
MCH: 26.9 pg (ref 26.0–34.0)
MCHC: 30.6 g/dL (ref 30.0–36.0)
MCV: 88.1 fL (ref 78.0–100.0)
PLATELETS: 375 10*3/uL (ref 150–400)
RBC: 4.38 MIL/uL (ref 4.22–5.81)
RDW: 15.3 % (ref 11.5–15.5)
WBC: 14.5 10*3/uL — AB (ref 4.0–10.5)

## 2015-02-10 LAB — GLUCOSE, CAPILLARY
GLUCOSE-CAPILLARY: 176 mg/dL — AB (ref 65–99)
GLUCOSE-CAPILLARY: 153 mg/dL — AB (ref 65–99)
GLUCOSE-CAPILLARY: 132 mg/dL — AB (ref 65–99)
GLUCOSE-CAPILLARY: 236 mg/dL — AB (ref 65–99)
Glucose-Capillary: 127 mg/dL — ABNORMAL HIGH (ref 65–99)

## 2015-02-10 LAB — APTT: aPTT: 34 seconds (ref 24–37)

## 2015-02-10 LAB — PROTIME-INR
INR: 1.16 (ref 0.00–1.49)
PROTHROMBIN TIME: 15 s (ref 11.6–15.2)

## 2015-02-10 SURGERY — AMPUTATION BELOW KNEE
Anesthesia: Monitor Anesthesia Care | Site: Leg Lower | Laterality: Right

## 2015-02-10 MED ORDER — METHOCARBAMOL 500 MG PO TABS
500.0000 mg | ORAL_TABLET | Freq: Four times a day (QID) | ORAL | Status: DC | PRN
  Administered 2015-02-10: 500 mg via ORAL
  Filled 2015-02-10: qty 1

## 2015-02-10 MED ORDER — CEFAZOLIN SODIUM 1-5 GM-% IV SOLN
1.0000 g | Freq: Two times a day (BID) | INTRAVENOUS | Status: AC
  Administered 2015-02-10 – 2015-02-11 (×2): 1 g via INTRAVENOUS
  Filled 2015-02-10 (×2): qty 50

## 2015-02-10 MED ORDER — EPHEDRINE SULFATE 50 MG/ML IJ SOLN
INTRAMUSCULAR | Status: DC | PRN
  Administered 2015-02-10 (×2): 10 mg via INTRAVENOUS

## 2015-02-10 MED ORDER — PHENYLEPHRINE HCL 10 MG/ML IJ SOLN
INTRAMUSCULAR | Status: DC | PRN
  Administered 2015-02-10 (×2): 120 ug via INTRAVENOUS
  Administered 2015-02-10: 80 ug via INTRAVENOUS

## 2015-02-10 MED ORDER — TAMSULOSIN HCL 0.4 MG PO CAPS
0.4000 mg | ORAL_CAPSULE | Freq: Every evening | ORAL | Status: DC
  Administered 2015-02-10 – 2015-02-12 (×3): 0.4 mg via ORAL
  Filled 2015-02-10 (×3): qty 1

## 2015-02-10 MED ORDER — ONDANSETRON HCL 4 MG/2ML IJ SOLN: 4.0000 mg | Freq: Four times a day (QID) | INTRAMUSCULAR | Status: DC | PRN

## 2015-02-10 MED ORDER — FUROSEMIDE 40 MG PO TABS
40.0000 mg | ORAL_TABLET | Freq: Every day | ORAL | Status: DC
  Administered 2015-02-11 – 2015-02-13 (×3): 40 mg via ORAL
  Filled 2015-02-10 (×4): qty 1

## 2015-02-10 MED ORDER — HYDROMORPHONE HCL 1 MG/ML IJ SOLN
1.0000 mg | INTRAMUSCULAR | Status: DC | PRN
  Administered 2015-02-10: 1 mg via INTRAVENOUS
  Filled 2015-02-10: qty 1

## 2015-02-10 MED ORDER — BISACODYL 10 MG RE SUPP: 10.0000 mg | Freq: Every day | RECTAL | Status: DC | PRN

## 2015-02-10 MED ORDER — METOCLOPRAMIDE HCL 5 MG PO TABS: 5.0000 mg | ORAL_TABLET | Freq: Three times a day (TID) | ORAL | Status: DC | PRN

## 2015-02-10 MED ORDER — PROPOFOL 500 MG/50ML IV EMUL
INTRAVENOUS | Status: DC | PRN
  Administered 2015-02-10: 30 ug/kg/min via INTRAVENOUS

## 2015-02-10 MED ORDER — PENTOXIFYLLINE ER 400 MG PO TBCR
400.0000 mg | EXTENDED_RELEASE_TABLET | Freq: Three times a day (TID) | ORAL | Status: DC
  Administered 2015-02-11 – 2015-02-13 (×7): 400 mg via ORAL
  Filled 2015-02-10 (×10): qty 1

## 2015-02-10 MED ORDER — METOCLOPRAMIDE HCL 5 MG/ML IJ SOLN: 5.0000 mg | Freq: Three times a day (TID) | INTRAMUSCULAR | Status: DC | PRN

## 2015-02-10 MED ORDER — METHOCARBAMOL 1000 MG/10ML IJ SOLN
500.0000 mg | Freq: Four times a day (QID) | INTRAVENOUS | Status: DC | PRN
  Filled 2015-02-10: qty 5

## 2015-02-10 MED ORDER — 0.9 % SODIUM CHLORIDE (POUR BTL) OPTIME
TOPICAL | Status: DC | PRN
  Administered 2015-02-10: 1000 mL

## 2015-02-10 MED ORDER — ACETAMINOPHEN 325 MG PO TABS
650.0000 mg | ORAL_TABLET | Freq: Four times a day (QID) | ORAL | Status: DC | PRN
Start: 2015-02-10 — End: 2015-02-13
  Filled 2015-02-10: qty 2

## 2015-02-10 MED ORDER — ASPIRIN 81 MG PO CHEW
81.0000 mg | CHEWABLE_TABLET | Freq: Every day | ORAL | Status: DC
  Administered 2015-02-11 – 2015-02-13 (×3): 81 mg via ORAL
  Filled 2015-02-10 (×4): qty 1

## 2015-02-10 MED ORDER — OXYCODONE HCL 5 MG PO TABS
5.0000 mg | ORAL_TABLET | ORAL | Status: DC | PRN
  Administered 2015-02-10 – 2015-02-11 (×2): 10 mg via ORAL
  Filled 2015-02-10 (×2): qty 2

## 2015-02-10 MED ORDER — GLIPIZIDE 5 MG PO TABS
5.0000 mg | ORAL_TABLET | Freq: Two times a day (BID) | ORAL | Status: DC
  Administered 2015-02-11 – 2015-02-12 (×3): 5 mg via ORAL
  Filled 2015-02-10 (×5): qty 1

## 2015-02-10 MED ORDER — POLYETHYLENE GLYCOL 3350 17 G PO PACK: 17.0000 g | PACK | Freq: Every day | ORAL | Status: DC | PRN

## 2015-02-10 MED ORDER — ONDANSETRON HCL 4 MG PO TABS: 4.0000 mg | ORAL_TABLET | Freq: Four times a day (QID) | ORAL | Status: DC | PRN

## 2015-02-10 MED ORDER — METOPROLOL TARTRATE 25 MG PO TABS
25.0000 mg | ORAL_TABLET | Freq: Two times a day (BID) | ORAL | Status: DC
  Administered 2015-02-10 – 2015-02-13 (×6): 25 mg via ORAL
  Filled 2015-02-10 (×7): qty 1

## 2015-02-10 MED ORDER — ACETAMINOPHEN 650 MG RE SUPP: 650.0000 mg | Freq: Four times a day (QID) | RECTAL | Status: DC | PRN

## 2015-02-10 MED ORDER — SIMVASTATIN 40 MG PO TABS
40.0000 mg | ORAL_TABLET | Freq: Every evening | ORAL | Status: DC
  Administered 2015-02-10 – 2015-02-12 (×3): 40 mg via ORAL
  Filled 2015-02-10 (×3): qty 1

## 2015-02-10 MED ORDER — DOCUSATE SODIUM 100 MG PO CAPS
100.0000 mg | ORAL_CAPSULE | Freq: Two times a day (BID) | ORAL | Status: DC
Start: 2015-02-10 — End: 2015-02-13
  Administered 2015-02-10 – 2015-02-13 (×6): 100 mg via ORAL
  Filled 2015-02-10 (×8): qty 1

## 2015-02-10 MED ORDER — SODIUM CHLORIDE 0.9 % IV SOLN: INTRAVENOUS | Status: DC

## 2015-02-10 MED ORDER — SODIUM CHLORIDE 0.9 % IV SOLN
INTRAVENOUS | Status: DC
  Administered 2015-02-10 (×2): via INTRAVENOUS

## 2015-02-10 SURGICAL SUPPLY — 43 items
BLADE SAW RECIP 87.9 MT (BLADE) ×3 IMPLANT
BLADE SURG 21 STRL SS (BLADE) ×3 IMPLANT
BNDG COHESIVE 6X5 TAN STRL LF (GAUZE/BANDAGES/DRESSINGS) ×4 IMPLANT
BNDG GAUZE ELAST 4 BULKY (GAUZE/BANDAGES/DRESSINGS) ×4 IMPLANT
COVER SURGICAL LIGHT HANDLE (MISCELLANEOUS) ×6 IMPLANT
CUFF TOURNIQUET SINGLE 34IN LL (TOURNIQUET CUFF) ×2 IMPLANT
CUFF TOURNIQUET SINGLE 44IN (TOURNIQUET CUFF) IMPLANT
DRAPE EXTREMITY T 121X128X90 (DRAPE) ×3 IMPLANT
DRAPE PROXIMA HALF (DRAPES) ×4 IMPLANT
DRAPE U-SHAPE 47X51 STRL (DRAPES) ×3 IMPLANT
DRSG ADAPTIC 3X8 NADH LF (GAUZE/BANDAGES/DRESSINGS) ×3 IMPLANT
DRSG PAD ABDOMINAL 8X10 ST (GAUZE/BANDAGES/DRESSINGS) ×5 IMPLANT
DURAPREP 26ML APPLICATOR (WOUND CARE) ×3 IMPLANT
ELECT REM PT RETURN 9FT ADLT (ELECTROSURGICAL) ×3
ELECTRODE REM PT RTRN 9FT ADLT (ELECTROSURGICAL) ×1 IMPLANT
GAUZE SPONGE 4X4 12PLY STRL (GAUZE/BANDAGES/DRESSINGS) ×3 IMPLANT
GLOVE BIO SURGEON STRL SZ8 (GLOVE) ×2 IMPLANT
GLOVE BIOGEL PI IND STRL 7.5 (GLOVE) IMPLANT
GLOVE BIOGEL PI IND STRL 8.5 (GLOVE) IMPLANT
GLOVE BIOGEL PI IND STRL 9 (GLOVE) ×1 IMPLANT
GLOVE BIOGEL PI INDICATOR 7.5 (GLOVE) ×2
GLOVE BIOGEL PI INDICATOR 8.5 (GLOVE) ×2
GLOVE BIOGEL PI INDICATOR 9 (GLOVE) ×4
GLOVE ECLIPSE 7.0 STRL STRAW (GLOVE) ×2 IMPLANT
GLOVE ECLIPSE 7.5 STRL STRAW (GLOVE) ×2 IMPLANT
GLOVE SURG ORTHO 9.0 STRL STRW (GLOVE) ×3 IMPLANT
GOWN STRL REUS W/ TWL XL LVL3 (GOWN DISPOSABLE) ×2 IMPLANT
GOWN STRL REUS W/TWL XL LVL3 (GOWN DISPOSABLE) ×6
KIT BASIN OR (CUSTOM PROCEDURE TRAY) ×3 IMPLANT
KIT ROOM TURNOVER OR (KITS) ×3 IMPLANT
MANIFOLD NEPTUNE II (INSTRUMENTS) ×3 IMPLANT
NS IRRIG 1000ML POUR BTL (IV SOLUTION) ×3 IMPLANT
PACK GENERAL/GYN (CUSTOM PROCEDURE TRAY) ×3 IMPLANT
PAD ARMBOARD 7.5X6 YLW CONV (MISCELLANEOUS) ×6 IMPLANT
SPONGE LAP 18X18 X RAY DECT (DISPOSABLE) IMPLANT
STAPLER VISISTAT 35W (STAPLE) ×2 IMPLANT
STOCKINETTE IMPERVIOUS LG (DRAPES) ×3 IMPLANT
SUT SILK 2 0 (SUTURE) ×3
SUT SILK 2-0 18XBRD TIE 12 (SUTURE) ×1 IMPLANT
SUT VIC AB 1 CTX 27 (SUTURE) ×4 IMPLANT
TOWEL OR 17X24 6PK STRL BLUE (TOWEL DISPOSABLE) ×3 IMPLANT
TOWEL OR 17X26 10 PK STRL BLUE (TOWEL DISPOSABLE) ×3 IMPLANT
WATER STERILE IRR 1000ML POUR (IV SOLUTION) ×3 IMPLANT

## 2015-02-10 NOTE — Anesthesia Postprocedure Evaluation (Signed)
Anesthesia Post Note  Patient: [name]  Procedure(s) Performed: Procedure(s) (LRB): Right Below Knee Amputation (Right)  Patient location during evaluation: PACU Anesthesia Type: Spinal Level of consciousness: awake, oriented and patient cooperative Pain management: pain level controlled Vital Signs Assessment: post-procedure vital signs reviewed and stable Respiratory status: spontaneous breathing and respiratory function stable Cardiovascular status: blood pressure returned to baseline and stable Postop Assessment: spinal receding Anesthetic complications: no    Last Vitals:  Filed Vitals:   02/10/15 1630 02/10/15 1645  BP: 117/51 118/50  Pulse: 76 73  Temp:    Resp: 23 22    Last Pain:  Filed Vitals:   02/10/15 1653  PainSc: Asleep                 Sherril Shipman EDWARD

## 2015-02-10 NOTE — Progress Notes (Signed)
Utilization review completed.  

## 2015-02-10 NOTE — H&P (Signed)
[name] is an 82 y.o. male.   Chief Complaint: Dehiscence right foot first ray amputation HPI: Patient is a 82 year old woman who is status post limb salvage intervention with a first ray amputation the right. Patient has had progressive dehiscence of the right foot wound and presents at this time for transtibial amputation.  Past Medical History  Diagnosis Date  . Hypertension   . Hyperlipidemia   . Diabetes mellitus   . Coronary artery disease     s/p CABG 1996  . Sick sinus syndrome (HCC)     s/p PPM 2001 (R sided),  RV lead fracture 10/12 with new system (MDT) placed on L side due to R sided venous occlusion  . Diabetic neuropathy (HCC)   . CVD (cardiovascular disease)     s/p CEA  . Peripheral vascular disease (HCC)   . Chronic kidney disease   . BPH (benign prostatic hyperplasia)   . Hypercholesteremia   . Myocardial infarction (HCC)   . CHF (congestive heart failure) (HCC)   . Ulcer of toe (HCC)   . Ulcer of heel and midfoot (HCC)   . Presence of permanent cardiac pacemaker     Past Surgical History  Procedure Laterality Date  . Coronary artery bypass graft    . Carotid endarterectomy    . Pacemaker insertion      s/p PPM 2001 (R sided),  RV lead fracture 10/12 with new system (MDT) placed on Left side due to R sided venous occlusion  . Hernia repair    . Cataract extraction w/ intraocular lens implant Bilateral   . Insert / replace / remove pacemaker    . Wound debridement Left 07/28/2014    Procedure: DEBRIDEMENT WOUND/LEFT HEEL DEBRIDMENT;  Surgeon: Annice Needy, MD;  Location: [facility] ORS;  Service: Vascular;  Laterality: Left;  . Application of wound vac Left 07/28/2014    Procedure: APPLICATION OF WOUND VAC;  Surgeon: Annice Needy, MD;  Location: [facility] ORS;  Service: Vascular;  Laterality: Left;  . Amputation Right 12/21/2014    Procedure: AMPUTATION RAY/RIGHT;  Surgeon: Nadara Mustard, MD;  Location: [facility] OR;  Service: Orthopedics;  Laterality: Right;    Family  History  Problem Relation Age of Onset  . CAD Brother    Social History:  reports that he quit smoking about 39 years ago. He has never used smokeless tobacco. He reports that he does not drink alcohol or use illicit drugs.  Allergies:  Allergies  Allergen Reactions  . Ace Inhibitors     Kidney failure  . Ciprofloxacin Rash    No prescriptions prior to admission    No results found for this or any previous visit (from the past 48 hour(s)). No results found.  Review of Systems  All other systems reviewed and are negative.   There were no vitals taken for this visit. Physical Exam  On examination patient has dehiscence of the first ray amputation right foot. Assessment/Plan Assessment: Dehiscence right foot first ray amputation.  Plan: We'll plan for right transtibial amputation. Risks and benefits were discussed including risk of the wound not healing. Patient and family state they understand and wish to proceed at this time.  DUDA,MARCUS V 02/10/2015, 6:24 AM

## 2015-02-10 NOTE — Anesthesia Procedure Notes (Signed)
Spinal Patient location during procedure: OR Start time: 02/10/2015 2:35 PM End time: 02/10/2015 2:42 PM Staffing Performed by: anesthesiologist  Preanesthetic Checklist Completed: patient identified, site marked, surgical consent, pre-op evaluation, timeout performed, IV checked, risks and benefits discussed and monitors and equipment checked Spinal Block Patient position: right lateral decubitus Prep: Betadine Patient monitoring: heart rate, cardiac monitor, continuous pulse ox and blood pressure Approach: midline Location: L3-4 Injection technique: single-shot Needle Needle gauge: 22 G Assessment Sensory level: T10 Additional Notes Pt agreed to procedure w/ risks.  ( 0.75% Marcaine) w/ mild difficulty. CSF clear free flow. GES

## 2015-02-10 NOTE — Anesthesia Preprocedure Evaluation (Signed)
Anesthesia Evaluation  Patient identified by MRN, date of birth, ID band Patient awake    Reviewed: Allergy & Precautions, NPO status , Patient's Chart, lab work & pertinent test results  Airway Mallampati: I  TM Distance: >3 FB     Dental   Pulmonary former smoker,    Pulmonary exam normal        Cardiovascular hypertension, + CAD, + Past MI, + Peripheral Vascular Disease and +CHF  Normal cardiovascular exam+ dysrhythmias + pacemaker      Neuro/Psych    GI/Hepatic   Endo/Other  diabetes, Type 2, Oral Hypoglycemic Agents  Renal/GU Renal disease     Musculoskeletal   Abdominal   Peds  Hematology   Anesthesia Other Findings   Reproductive/Obstetrics                             Anesthesia Physical Anesthesia Plan  ASA: IV  Anesthesia Plan: Spinal   Post-op Pain Management:    Induction:   Airway Management Planned: Mask  Additional Equipment:   Intra-op Plan:   Post-operative Plan:   Informed Consent: I have reviewed the patients History and Physical, chart, labs and discussed the procedure including the risks, benefits and alternatives for the proposed anesthesia with the patient or authorized representative who has indicated his/her understanding and acceptance.     Plan Discussed with: CRNA, Anesthesiologist and Surgeon  Anesthesia Plan Comments:         Anesthesia Quick Evaluation

## 2015-02-10 NOTE — Op Note (Signed)
   Date of Surgery: 02/10/2015  INDICATIONS: [name] is a 82 y.o.-year-old male who has undergone foot salvage intervention. Patient has progressive gangrenous changes and presents at this time for transtibial amputation.Marland Kitchen  PREOPERATIVE DIAGNOSIS: Gangrene right foot  POSTOPERATIVE DIAGNOSIS: Same.  PROCEDURE: Transtibial amputation right  SURGEON: Lajoyce Corners, M.D.  ANESTHESIA:  general  IV FLUIDS AND URINE: See anesthesia.  ESTIMATED BLOOD LOSS: Minimal mL.  COMPLICATIONS: None.  DESCRIPTION OF PROCEDURE: The patient was brought to the operating room and underwent a general anesthetic. After adequate levels of anesthesia were obtained patient's lower extremity was prepped using DuraPrep draped into a sterile field. A timeout was called.  A transverse incision was made 11 cm distal to the tibial tubercle. This curved proximally and a large posterior flap was created. The tibia was transected 1 cm proximal to the skin incision. The fibula was transected just proximal to the tibial incision. The tibia was beveled anteriorly. A large posterior flap was created. The sciatic nerve was pulled cut and allowed to retract. The vascular bundles were suture ligated with 2-0 silk. The deep and superficial fascial layers were closed using #1 Vicryl. The skin was closed using staples and 2-0 nylon. The wound was covered with Adaptic orthopedic sponges AB dressing Kerlix and Coban. Patient was extubated taken to the PACU in stable condition.  [name] Baker, MD [facility] 3:31 PM

## 2015-02-10 NOTE — Progress Notes (Deleted)
Patient with significant hypotension and surgery canceled by anesthesia. I will reevaluate for surgery next week.

## 2015-02-10 NOTE — Transfer of Care (Signed)
Immediate Anesthesia Transfer of Care Note  Patient: [name]  Procedure(s) Performed: Procedure(s): Right Below Knee Amputation (Right)  Patient Location: PACU  Anesthesia Type:MAC and Spinal  Level of Consciousness: awake and patient cooperative  Airway & Oxygen Therapy: Patient Spontanous Breathing and Patient connected to face mask oxygen  Post-op Assessment: Report given to RN and Post -op Vital signs reviewed and unstable, Anesthesiologist notified bp low - dr Maple Hudson at bs  Post vital signs: Reviewed and unstable  Last Vitals:  Filed Vitals:   02/10/15 1107  BP: 119/42  Pulse: 70  Temp: 36.5 C  Resp: 18    Complications: No apparent anesthesia complications

## 2015-02-11 LAB — GLUCOSE, CAPILLARY
GLUCOSE-CAPILLARY: 185 mg/dL — AB (ref 65–99)
GLUCOSE-CAPILLARY: 141 mg/dL — AB (ref 65–99)
GLUCOSE-CAPILLARY: 124 mg/dL — AB (ref 65–99)
GLUCOSE-CAPILLARY: 131 mg/dL — AB (ref 65–99)

## 2015-02-11 NOTE — Progress Notes (Signed)
Patient has not voided post surgery. Bladder scan revealed 450 ml. In and out catheterization done and 400 ml option.

## 2015-02-11 NOTE — Progress Notes (Signed)
Physical Therapy Evaluation Patient Details Name: [name] MRN: [id number] DOB: 11-Feb-1920 Today's Date: 02/11/2015   History of Present Illness  82 year old male with a history of DM, HTN, CAD post CABG and pacemaker, HLD, PVD with neuropathy, and CHF presented with nonhealing wound on his right foot, now with R BKA on 02/10/15.   Clinical Impression  New R BKA with additional wound L heel, patient lethargic and confused on evaluation, able to sit up to edge of bed only, unsafe to attempt transfer and need additional weight bearing clarification for L LE.  Patient willing to try with therapist, MAX A bed mobility, able to sit up with MIN Guard for 5 minutes.  Hope to increase mobility as confusion and lethargy clear.  Patient is good rehab candidate, needs PT to address transfers, and wheelchair mobility initially, with advancement to gait as indicated.  Anticipate prolonged recovery.    Follow Up Recommendations SNF;Supervision/Assistance - 24 hour    Equipment Recommendations  None recommended by PT    Recommendations for Other Services       Precautions / Restrictions Precautions Precautions: Fall Restrictions Weight Bearing Restrictions: Yes RLE Weight Bearing: Non weight bearing      Mobility  Bed Mobility Overal bed mobility: Needs Assistance Bed Mobility: Supine to Sit     Supine to sit: Max assist        Transfers Overall transfer level: Needs assistance               General transfer comment: Did not attempt transfer today due to safety and patient lethargy/confusion  Ambulation/Gait                Stairs            Wheelchair Mobility    Modified Rankin (Stroke Patients Only)       Balance Overall balance assessment: Needs assistance Sitting-balance support: Bilateral upper extremity supported Sitting balance-Leahy Scale: Fair Sitting balance - Comments: Sat at edge of bed with Min Guard assist after setup.                                      Pertinent Vitals/Pain Pain Assessment: Faces Faces Pain Scale: Hurts little more Pain Location: Rt leg, bottom Pain Descriptors / Indicators: Aching;Grimacing;Tender Pain Intervention(s): Limited activity within patient's tolerance;Monitored during session;Repositioned    Home Living Family/patient expects to be discharged to:: Skilled nursing facility   Available Help at Discharge: Skilled Nursing Facility                  Prior Function Level of Independence: Needs assistance   Gait / Transfers Assistance Needed: Reports he has been in rehab facility since December, was able to transfer to wheelchair.           Hand Dominance        Extremity/Trunk Assessment   Upper Extremity Assessment: Defer to OT evaluation;Overall [facility] for tasks assessed           Lower Extremity Assessment: Generalized weakness;RLE deficits/detail;LLE deficits/detail RLE Deficits / Details: New BKA on 02/10/15.       Communication   Communication: No difficulties  Cognition Arousal/Alertness: Lethargic;Suspect due to medications Behavior During Therapy: Flat affect;WFL for tasks assessed/performed Overall Cognitive Status: Difficult to assess (Lethargic)                      General Comments  General comments (skin integrity, edema, etc.): L heel wrap/wound, not visualized, R residual limb wrapped not visualized.    Exercises Amputee Exercises Hip ABduction/ADduction: AAROM;Right;10 reps;Supine Knee Flexion: AAROM;Both;10 reps;Supine Knee Extension: AAROM;Both;10 reps;Supine      Assessment/Plan    PT Assessment Patient needs continued PT services  PT Diagnosis Difficulty walking;Generalized weakness;Acute pain   PT Problem List Decreased strength;Decreased range of motion;Decreased activity tolerance;Decreased balance;Decreased mobility;Impaired sensation;Pain;Decreased skin integrity  PT Treatment Interventions Functional  mobility training;Therapeutic activities;Therapeutic exercise;Gait training;Balance training;Patient/family education;Wheelchair mobility training   PT Goals (Current goals can be found in the Care Plan section) Acute Rehab PT Goals Patient Stated Goal: To get better PT Goal Formulation: With patient Time For Goal Achievement: 02/25/15 Potential to Achieve Goals: Good    Frequency Min 2X/week   Barriers to discharge Decreased caregiver support      Co-evaluation               End of Session Equipment Utilized During Treatment: Gait belt Activity Tolerance: Patient limited by lethargy;Patient limited by pain Patient left: in bed;with call bell/phone within reach;with SCD's reapplied Nurse Communication: Mobility status         Time: 1200-1230 PT Time Calculation (min) (ACUTE ONLY): 30 min   Charges:   PT Evaluation $PT Eval Moderate Complexity: 1 Procedure     PT G Codes:        Teriana Danker L 02-27-15, 1:12 PM

## 2015-02-11 NOTE — Progress Notes (Signed)
Bladder scan this hour and patient has 600 ml. In and out cath was performed and 550 ml obtained.

## 2015-02-11 NOTE — Clinical Social Work Placement (Signed)
   CLINICAL SOCIAL WORK PLACEMENT  NOTE  Date:  02/11/2015  Patient Details  Name: [name] DOBOSZ MRN: [id number] Date of Birth: [birth year]  Clinical Social Work is seeking post-discharge placement for this patient at the Skilled  Nursing Facility level of care (*CSW will initial, date and re-position this form in  chart as items are completed):  Yes   Patient/family provided with [facility] Clinical Social Work Department's list of facilities offering this level of care within the geographic area requested by the patient (or if unable, by the patient's family).  Yes   Patient/family informed of their freedom to choose among providers that offer the needed level of care, that participate in Medicare, Medicaid or managed care program needed by the patient, have an available bed and are willing to accept the patient.  Yes   Patient/family informed of [facility]'s ownership interest in [facility] and [facility], as well as of the fact that they are under no obligation to receive care at these facilities.  PASRR submitted to EDS on       PASRR number received on       Existing PASRR number confirmed on 02/11/15     FL2 transmitted to all facilities in geographic area requested by pt/family on 02/11/15     FL2 transmitted to all facilities within larger geographic area on       Patient informed that his/her managed care company has contracts with or will negotiate with certain facilities, including the following:            Patient/family informed of bed offers received.  Patient chooses bed at       Physician recommends and patient chooses bed at      Patient to be transferred to   on  .  Patient to be transferred to facility by       Patient family notified on   of transfer.  Name of family member notified:        PHYSICIAN       Additional Comment:    _______________________________________________ Marnee Spring, LCSW 02/11/2015, 10:38 AM

## 2015-02-11 NOTE — Clinical Social Work Note (Signed)
Clinical Social Work Assessment  Patient Details  Name: [name] MRN: [id number] Date of Birth: Jul 10, 1920  Date of referral:  02/11/15               Reason for consult:  Discharge Planning                Permission sought to share information with:  Family Supports Permission granted to share information::  Yes, Verbal Permission Granted  Name::     Aeronautical engineer::     Relationship::  dtr  Contact Information:     Housing/Transportation Living arrangements for the past 2 months:  Skilled Building surveyor of Information:  Adult Children Patient Interpreter Needed:    Criminal Activity/Legal Involvement Pertinent to Current Situation/Hospitalization:  No - Comment as needed Significant Relationships:  Adult Children Lives with:  Facility Resident Do you feel safe going back to the place where you live?  No Need for family participation in patient care:  Yes (Comment)  Care giving concerns:  Pt is LT resident and will need to return to SNF at [place].   Social Worker assessment / plan:  CSW went to room to assess pt but pt unable to fully participate and no family present. CSW spoke with dtr [name]) via phone who reports pt is LT resident at [facility] but they are not satisfied with care. Family has tried to get pt moved to [facility] in [place] but no bed has been available. CSW left SNF list in room and explained that search could be initiated but that LT beds are more difficult to locate than rehab beds. Family requests [facility] and [facility] search for additional options.  CSW completed FL2 and faxed out. CSW to follow up with alternative bed offers.  Employment status:  Retired Health and safety inspector:  Medicare PT Recommendations:  Skilled Nursing Facility Information / Referral to community resources:  Skilled Nursing Facility  Patient/Family's Response to care:  Family engaged and asked appropriate questions.  Patient/Family's Understanding of and Emotional  Response to Diagnosis, Current Treatment, and Prognosis:  Dtr understanding that pt needs LT care but is hopeful to find a facility that meets their needs better.   Emotional Assessment Appearance:  Appears stated age Attitude/Demeanor/Rapport:  Lethargic Affect (typically observed):  Restless Orientation:  Oriented to Self Alcohol / Substance use:  Not Applicable Psych involvement (Current and /or in the community):  No (Comment)  Discharge Needs  Concerns to be addressed:  No discharge needs identified Readmission within the last 30 days:  No Current discharge risk:  None Barriers to Discharge:  No Barriers Identified   Marnee Spring, LCSW 02/11/2015, 10:36 AM Weekend Coverage

## 2015-02-11 NOTE — Progress Notes (Signed)
Patient ID: [name], male   DOB: 11/03/1920, 82 y.o.   MRN: [id number] Postoperative day 1 right transtibial amputation. Patient required  Foley catheterization last night. If repeat urinary retention will leave the Foley in place. Patient will need discharge to skilled nursing. Plan for discharge to skilled nursing once a bed is available.

## 2015-02-11 NOTE — Progress Notes (Signed)
Bladder scan this hour and patient has 300 ml. Will wait to give patient a chance to void.

## 2015-02-11 NOTE — Clinical Documentation Improvement (Signed)
Orthopedic  Can the diagnosis of CKD be further specified?   CKD Stage I - GFR greater than or equal to 90  CKD Stage II - GFR 60-89  CKD Stage III - GFR 30-59  CKD Stage IV - GFR 15-29  CKD Stage V - GFR < 15  ESRD (End Stage Renal Disease)   Supporting Information:   -->"Chronic kidney disease" documented in the Past Medical History of H&P.  Component     Latest Ref Range 02/10/2015  BUN     6 - 20 mg/dL 25 (H)  Creatinine     0.61 - 1.24 mg/dL 1.96 (H)  EGFR (Non-African Amer.)     >60 mL/min 28 (L)   Please update your documentation within the medical record to reflect your response to this query.   Please exercise your independent, professional judgment when responding.  A specific answer is not anticipated or expected.   Thank You,  Posey Pronto, RN, BSN, [place] Clinical Documentation Improvement Specialist HIM department--[place] [phone]

## 2015-02-11 NOTE — [place] FL2 (Signed)
[place] MEDICAID FL2 LEVEL OF CARE SCREENING TOOL     IDENTIFICATION  Patient Name: [name] Birthdate: [birth year] Sex: male Admission Date (Current Location): 02/10/2015  [facility] and [place] Number:  Producer, television/film/video and Address:  [facility] [place], [address], [place], [place] [address]      Provider Number: [id number]  Attending Physician Name and Address:  Nadara Mustard, MD  Relative Name and Phone Number:       Current Level of Care: Hospital Recommended Level of Care: Skilled Nursing Facility Prior Approval Number:    Date Approved/Denied:   PASRR Number: [id number]  Discharge Plan: SNF    Current Diagnoses: Patient Active Problem List   Diagnosis Date Noted  . S/P BKA (below knee amputation) unilateral (HCC) 02/10/2015  . Cellulitis of toe, right 12/20/2014  . DM (diabetes mellitus), type 2 with renal complications (HCC) 12/20/2014  . CAD (coronary artery disease) 12/20/2014  . CKD (chronic kidney disease) stage 3, GFR 30-59 ml/min 12/20/2014  . Acute osteomyelitis of toe of right foot (HCC) 12/20/2014  . Diabetic ulcer of right foot associated with type 2 diabetes mellitus (HCC)   . Cellulitis of toe of right foot   . Cellulitis 12/19/2014  . Type 1 diabetes mellitus with diabetic heel ulcer (HCC) 09/19/2014  . Complete heart block (HCC) 12/10/2012  . Pacemaker-Medtronic 11/20/2011  . DIZZINESS 11/01/2008  . EDEMA 11/01/2008  . DM 04/15/2008  . DIABETIC PERIPHERAL NEUROPATHY 04/15/2008  . HYPERLIPIDEMIA 04/15/2008  . Essential hypertension 04/15/2008  . CAD 04/15/2008  . Sick sinus syndrome (HCC) 04/15/2008    Orientation RESPIRATION BLADDER Height & Weight    Self  O2 (2L) Incontinent  (177.8 cm) 200 lbs.  BEHAVIORAL SYMPTOMS/MOOD NEUROLOGICAL BOWEL NUTRITION STATUS      Continent Diet (Carb. Modified)  AMBULATORY STATUS COMMUNICATION OF NEEDS Skin   Extensive Assist Verbally Surgical wounds (Incision on  right leg with compression wrap)                       Personal Care Assistance Level of Assistance  Bathing, Feeding, Dressing Bathing Assistance: Maximum assistance Feeding assistance: Limited assistance Dressing Assistance: Maximum assistance     Functional Limitations Info  Sight, Hearing, Speech Sight Info: Adequate Hearing Info: Adequate Speech Info: Adequate    SPECIAL CARE FACTORS FREQUENCY  PT (By licensed PT), OT (By licensed OT)                    Contractures      Additional Factors Info  Code Status, Allergies Code Status Info: FULL Allergies Info: Ace Inhibitors, Ciprofloxacin           Current Medications (02/11/2015):  This is the current hospital active medication list Current Facility-Administered Medications  Medication Dose Route Frequency Provider Last Rate Last Dose  . 0.9 %  sodium chloride infusion   Intravenous Continuous Aldean Baker V, MD      . acetaminophen (TYLENOL) tablet 650 mg  650 mg Oral Q6H PRN Nadara Mustard, MD       Or  . acetaminophen (TYLENOL) suppository 650 mg  650 mg Rectal Q6H PRN Nadara Mustard, MD      . aspirin chewable tablet 81 mg  81 mg Oral Daily Nadara Mustard, MD   81 mg at 02/11/15 0958  . bisacodyl (DULCOLAX) suppository 10 mg  10 mg Rectal Daily PRN Nadara Mustard, MD      .  docusate sodium (COLACE) capsule 100 mg  100 mg Oral BID Nadara Mustard, MD   100 mg at 02/11/15 0958  . furosemide (LASIX) tablet 40 mg  40 mg Oral Daily Nadara Mustard, MD   40 mg at 02/11/15 0958  . glipiZIDE (GLUCOTROL) tablet 5 mg  5 mg Oral BID AC Nadara Mustard, MD   5 mg at 02/11/15 0807  . HYDROmorphone (DILAUDID) injection 1 mg  1 mg Intravenous Q2H PRN Nadara Mustard, MD   1 mg at 02/10/15 1944  . methocarbamol (ROBAXIN) tablet 500 mg  500 mg Oral Q6H PRN Nadara Mustard, MD   500 mg at 02/10/15 1945   Or  . methocarbamol (ROBAXIN) 500 mg in dextrose 5 % 50 mL IVPB  500 mg Intravenous Q6H PRN Nadara Mustard, MD      . metoCLOPramide  (REGLAN) tablet 5-10 mg  5-10 mg Oral Q8H PRN Nadara Mustard, MD       Or  . metoCLOPramide (REGLAN) injection 5-10 mg  5-10 mg Intravenous Q8H PRN Nadara Mustard, MD      . metoprolol tartrate (LOPRESSOR) tablet 25 mg  25 mg Oral BID Nadara Mustard, MD   25 mg at 02/11/15 0958  . ondansetron (ZOFRAN) tablet 4 mg  4 mg Oral Q6H PRN Nadara Mustard, MD       Or  . ondansetron [facility]) injection 4 mg  4 mg Intravenous Q6H PRN Aldean Baker V, MD      . oxyCODONE (Oxy IR/ROXICODONE) immediate release tablet 5-10 mg  5-10 mg Oral Q3H PRN Nadara Mustard, MD   10 mg at 02/11/15 0810  . pentoxifylline (TRENTAL) CR tablet 400 mg  400 mg Oral TID WC Nadara Mustard, MD   400 mg at 02/11/15 0807  . polyethylene glycol (MIRALAX / GLYCOLAX) packet 17 g  17 g Oral Daily PRN Nadara Mustard, MD      . simvastatin (ZOCOR) tablet 40 mg  40 mg Oral QPM Nadara Mustard, MD   40 mg at 02/10/15 2147  . tamsulosin (FLOMAX) capsule 0.4 mg  0.4 mg Oral QPM Nadara Mustard, MD   0.4 mg at 02/10/15 1947     Discharge Medications: Please see discharge summary for a list of discharge medications.  Relevant Imaging Results:  Relevant Lab Results:   Additional Information    Marnee Spring, LCSW Weekend Coverage

## 2015-02-12 LAB — GLUCOSE, CAPILLARY
GLUCOSE-CAPILLARY: 109 mg/dL — AB (ref 65–99)
Glucose-Capillary: 66 mg/dL (ref 65–99)

## 2015-02-12 NOTE — Progress Notes (Signed)
Patient ID: [name], male   DOB: Jan 30, 1920, 82 y.o.   MRN: [id number] Patient is status post right transtibial amputation. Examination the dressing is clean and dry. Patient currently has a Foley catheter was placed due to his chronic kidney disease stage IV. Plan for discharge to skilled nursing.

## 2015-02-13 ENCOUNTER — Encounter (HOSPITAL_COMMUNITY): Payer: Self-pay | Admitting: Orthopedic Surgery

## 2015-02-13 DIAGNOSIS — L8990 Pressure ulcer of unspecified site, unspecified stage: Secondary | ICD-10-CM | POA: Insufficient documentation

## 2015-02-13 LAB — GLUCOSE, CAPILLARY
GLUCOSE-CAPILLARY: 112 mg/dL — AB (ref 65–99)
Glucose-Capillary: 154 mg/dL — ABNORMAL HIGH (ref 65–99)

## 2015-02-13 MED ORDER — ACETAMINOPHEN 325 MG PO TABS: 650.0000 mg | ORAL_TABLET | Freq: Four times a day (QID) | ORAL | Status: AC | PRN

## 2015-02-13 NOTE — Discharge Summary (Signed)
Physician Discharge Summary  Patient ID: [name] MRN: [id number] DOB/AGE: 80-26-1922 80 y.o.  Admit date: 02/10/2015 Discharge date: 02/13/2015  Admission Diagnoses: Right right foot gangrene with left heel decubitus ulcer with urinary incontinence.  Discharge Diagnoses:  Active Problems:   S/P BKA (below knee amputation) unilateral (HCC)   Pressure ulcer   Discharged Condition: stable  Hospital Course: Patient's hospital course was essentially unremarkable. Patient underwent a transtibial amputation on the right for the gangrenous changes to the right foot. Patient was started on wound care for the left heel and a Foley catheter was placed through his urinary incontinence and stage IV kidney disease. Patient was unable to ambulate independently and was discharged to skilled nursing.  Consults: None  Significant Diagnostic Studies: labs: Routine labs  Treatments: surgery: See operative note  Discharge Exam: Blood pressure 126/72, pulse 70, temperature 98.6 F (37 C), temperature source Oral, resp. rate 18, height 5' 10.5" (1.791 m), weight 90.719 kg (200 lb), SpO2 100 %. Incision/Wound: dressing clean and dry  Disposition: 03-Skilled Nursing Facility     Medication List    ASK your doctor about these medications        aspirin 81 MG tablet  Take 81 mg by mouth daily.     collagenase ointment  Commonly known as:  SANTYL  Apply 1 application topically daily.     doxycycline 100 MG capsule  Commonly known as:  VIBRAMYCIN  Take 100 mg by mouth 2 (two) times daily.     furosemide 40 MG tablet  Commonly known as:  LASIX  Take 40 mg by mouth daily.     glipiZIDE 5 MG tablet  Commonly known as:  GLUCOTROL  Take 5 mg by mouth 2 (two) times daily before a meal. PRN For blood sugars over 120     HYDROcodone-acetaminophen 5-325 MG tablet  Commonly known as:  NORCO  Take 1 tablet by mouth every 6 (six) hours as needed for severe pain.     MENS MULTIVITAMIN PLUS  PO  Take 1 tablet by mouth daily.     methocarbamol 500 MG tablet  Commonly known as:  ROBAXIN  Take 1 tablet (500 mg total) by mouth every 8 (eight) hours as needed for muscle spasms.     metoprolol tartrate 25 MG tablet  Commonly known as:  LOPRESSOR  Take 25 mg by mouth 2 (two) times daily.     nitroGLYCERIN 0.2 mg/hr patch  Commonly known as:  NITRODUR - Dosed in mg/24 hr  Place 0.2 mg onto the skin daily.     pentoxifylline 400 MG CR tablet  Commonly known as:  TRENTAL  Take 400 mg by mouth 3 (three) times daily with meals.     PHILLIPS COLON HEALTH PO  Take 1 capsule by mouth daily.     simvastatin 40 MG tablet  Commonly known as:  ZOCOR  Take 40 mg by mouth every evening.     tamsulosin 0.4 MG Caps capsule  Commonly known as:  FLOMAX  Take 0.4 mg by mouth every evening.           Follow-up Information    Follow up with Myrtha Tonkovich V, MD In 2 weeks.   Specialty:  Orthopedic Surgery   Contact information:   [address] [place] [address] [phone]       Signed: Nadara Mustard 02/13/2015, 6:22 AM

## 2015-02-13 NOTE — Progress Notes (Addendum)
RN called and gave report to RN Corrie Dandy at [facility] patient is being discharged to. Pt belongings sent with daughter.

## 2015-02-13 NOTE — Progress Notes (Signed)
Pt declined 8am Glipizide medication. RN explained to patient risk of not taking medication as ordered by physician; pt verbalized understanding.

## 2015-02-13 NOTE — Care Management Important Message (Signed)
Important Message  Patient Details  Name: [name] MRN: [id number] Date of Birth: Jan 30, 1920   Medicare Important Message Given:  Yes    Kyla Balzarine 02/13/2015, 3:39 PM

## 2015-02-13 NOTE — Progress Notes (Signed)
Patient ID: [name], male   DOB: Jul 07, 1920, 82 y.o.   MRN: [id number] Orders written for discharge to skilled nursing facility when a bed is available. Patient has not received his Prevalon boot for the left foot.

## 2015-02-13 NOTE — Clinical Social Work Note (Addendum)
11:19am- CSW received denial from [facility] and [facility] regarding LTC placement.  Patient will return to [facility].  CSW spoke with daughter who is agreeable for patient's return.  Daughter states patient is on the waitlist for [facility] so she will continue to pursue the transfer at [facility].  CSW offered support regarding daughter's transfer request being denied at this time as LTC beds are hard to obtain. CSW spoke with [facility] at [facility] and sent dc summary for their review.  SNF is agreeable to patient's return.  [facility] transportation has been requested.  Patient will discharge today per MD order. Patient will discharge to: [facility]- LTC RN to call report prior to transportation to [phone] Transportation: [facility]- to be scheduled at 1pm per daughter's convenience (she will be at bedside)  CSW sent discharge summary to SNF for review.    10:20am- Patient has dc summary/order in for today and is from [facility] where the patient pays OOP for a bed.  Patient is in the process of spending down in order to apply for Medicaid.  Daughter is aware only two facilities are considering (not concrete bed offers)- [facility] and [facility].  Daughter is also aware that if neither of these facilities can take him today, patient will need to discharge back to [facility] and continue to seek transfer to [facility] from the facility as patient has discharge orders in today.  Vickii Penna, LCSW [phone]  5N1-9; 2S 15-16 and [facility] Licensed Clinical Social Worker

## 2015-02-22 DIAGNOSIS — J189 Pneumonia, unspecified organism: Secondary | ICD-10-CM

## 2015-02-22 HISTORY — DX: Pneumonia, unspecified organism: J18.9

## 2015-02-24 ENCOUNTER — Encounter (HOSPITAL_COMMUNITY): Payer: Self-pay

## 2015-02-24 ENCOUNTER — Emergency Department (HOSPITAL_COMMUNITY): Payer: Medicare Other

## 2015-02-24 ENCOUNTER — Inpatient Hospital Stay (HOSPITAL_COMMUNITY)
Admission: EM | Admit: 2015-02-24 | Discharge: 2015-02-28 | DRG: 682 | Disposition: A | Discharge status: Skilled Nursing Facility | Payer: Medicare Other | Attending: Family Medicine | Admitting: Family Medicine

## 2015-02-24 DIAGNOSIS — Z89511 Acquired absence of right leg below knee: Secondary | ICD-10-CM | POA: Diagnosis not present

## 2015-02-24 DIAGNOSIS — Z951 Presence of aortocoronary bypass graft: Secondary | ICD-10-CM

## 2015-02-24 DIAGNOSIS — E1142 Type 2 diabetes mellitus with diabetic polyneuropathy: Secondary | ICD-10-CM | POA: Diagnosis present

## 2015-02-24 DIAGNOSIS — N401 Enlarged prostate with lower urinary tract symptoms: Secondary | ICD-10-CM | POA: Diagnosis present

## 2015-02-24 DIAGNOSIS — I252 Old myocardial infarction: Secondary | ICD-10-CM

## 2015-02-24 DIAGNOSIS — E785 Hyperlipidemia, unspecified: Secondary | ICD-10-CM

## 2015-02-24 DIAGNOSIS — I509 Heart failure, unspecified: Secondary | ICD-10-CM | POA: Diagnosis present

## 2015-02-24 DIAGNOSIS — E7800 Pure hypercholesterolemia, unspecified: Secondary | ICD-10-CM | POA: Diagnosis present

## 2015-02-24 DIAGNOSIS — I2510 Atherosclerotic heart disease of native coronary artery without angina pectoris: Secondary | ICD-10-CM | POA: Diagnosis present

## 2015-02-24 DIAGNOSIS — I130 Hypertensive heart and chronic kidney disease with heart failure and stage 1 through stage 4 chronic kidney disease, or unspecified chronic kidney disease: Secondary | ICD-10-CM | POA: Diagnosis present

## 2015-02-24 DIAGNOSIS — N183 Chronic kidney disease, stage 3 unspecified: Secondary | ICD-10-CM | POA: Diagnosis present

## 2015-02-24 DIAGNOSIS — R627 Adult failure to thrive: Secondary | ICD-10-CM | POA: Diagnosis present

## 2015-02-24 DIAGNOSIS — Z7982 Long term (current) use of aspirin: Secondary | ICD-10-CM

## 2015-02-24 DIAGNOSIS — E11621 Type 2 diabetes mellitus with foot ulcer: Secondary | ICD-10-CM | POA: Diagnosis present

## 2015-02-24 DIAGNOSIS — Z9841 Cataract extraction status, right eye: Secondary | ICD-10-CM

## 2015-02-24 DIAGNOSIS — L89313 Pressure ulcer of right buttock, stage 3: Secondary | ICD-10-CM | POA: Diagnosis present

## 2015-02-24 DIAGNOSIS — N179 Acute kidney failure, unspecified: Secondary | ICD-10-CM | POA: Diagnosis not present

## 2015-02-24 DIAGNOSIS — L89323 Pressure ulcer of left buttock, stage 3: Secondary | ICD-10-CM | POA: Diagnosis present

## 2015-02-24 DIAGNOSIS — N390 Urinary tract infection, site not specified: Secondary | ICD-10-CM | POA: Diagnosis present

## 2015-02-24 DIAGNOSIS — L89623 Pressure ulcer of left heel, stage 3: Secondary | ICD-10-CM | POA: Diagnosis present

## 2015-02-24 DIAGNOSIS — I10 Essential (primary) hypertension: Secondary | ICD-10-CM | POA: Diagnosis present

## 2015-02-24 DIAGNOSIS — Z950 Presence of cardiac pacemaker: Secondary | ICD-10-CM

## 2015-02-24 DIAGNOSIS — R8290 Unspecified abnormal findings in urine: Secondary | ICD-10-CM | POA: Diagnosis present

## 2015-02-24 DIAGNOSIS — E11649 Type 2 diabetes mellitus with hypoglycemia without coma: Secondary | ICD-10-CM | POA: Diagnosis present

## 2015-02-24 DIAGNOSIS — Z7984 Long term (current) use of oral hypoglycemic drugs: Secondary | ICD-10-CM

## 2015-02-24 DIAGNOSIS — Z978 Presence of other specified devices: Secondary | ICD-10-CM

## 2015-02-24 DIAGNOSIS — Z87891 Personal history of nicotine dependence: Secondary | ICD-10-CM

## 2015-02-24 DIAGNOSIS — R338 Other retention of urine: Secondary | ICD-10-CM | POA: Diagnosis present

## 2015-02-24 DIAGNOSIS — Z961 Presence of intraocular lens: Secondary | ICD-10-CM | POA: Diagnosis present

## 2015-02-24 DIAGNOSIS — Z79899 Other long term (current) drug therapy: Secondary | ICD-10-CM | POA: Diagnosis not present

## 2015-02-24 DIAGNOSIS — E1129 Type 2 diabetes mellitus with other diabetic kidney complication: Secondary | ICD-10-CM | POA: Diagnosis present

## 2015-02-24 DIAGNOSIS — E1121 Type 2 diabetes mellitus with diabetic nephropathy: Secondary | ICD-10-CM | POA: Diagnosis present

## 2015-02-24 DIAGNOSIS — N485 Ulcer of penis: Secondary | ICD-10-CM | POA: Diagnosis present

## 2015-02-24 DIAGNOSIS — Z66 Do not resuscitate: Secondary | ICD-10-CM | POA: Diagnosis present

## 2015-02-24 DIAGNOSIS — Z9842 Cataract extraction status, left eye: Secondary | ICD-10-CM

## 2015-02-24 DIAGNOSIS — E1122 Type 2 diabetes mellitus with diabetic chronic kidney disease: Secondary | ICD-10-CM

## 2015-02-24 DIAGNOSIS — E860 Dehydration: Secondary | ICD-10-CM | POA: Diagnosis present

## 2015-02-24 DIAGNOSIS — E162 Hypoglycemia, unspecified: Secondary | ICD-10-CM | POA: Diagnosis not present

## 2015-02-24 DIAGNOSIS — Z89519 Acquired absence of unspecified leg below knee: Secondary | ICD-10-CM

## 2015-02-24 DIAGNOSIS — Z960 Presence of urogenital implants: Secondary | ICD-10-CM

## 2015-02-24 DIAGNOSIS — L97519 Non-pressure chronic ulcer of other part of right foot with unspecified severity: Secondary | ICD-10-CM

## 2015-02-24 DIAGNOSIS — L97419 Non-pressure chronic ulcer of right heel and midfoot with unspecified severity: Secondary | ICD-10-CM | POA: Diagnosis present

## 2015-02-24 LAB — GLUCOSE, CAPILLARY: Glucose-Capillary: 142 mg/dL — ABNORMAL HIGH (ref 65–99)

## 2015-02-24 LAB — CBC
HCT: 34.9 % — ABNORMAL LOW (ref 39.0–52.0)
HEMOGLOBIN: 10.7 g/dL — AB (ref 13.0–17.0)
MCH: 26.8 pg (ref 26.0–34.0)
MCHC: 30.7 g/dL (ref 30.0–36.0)
MCV: 87.3 fL (ref 78.0–100.0)
Platelets: 333 10*3/uL (ref 150–400)
RBC: 4 MIL/uL — AB (ref 4.22–5.81)
RDW: 15.3 % (ref 11.5–15.5)
WBC: 8.6 10*3/uL (ref 4.0–10.5)

## 2015-02-24 LAB — URINE MICROSCOPIC-ADD ON

## 2015-02-24 LAB — CBC WITH DIFFERENTIAL/PLATELET
BASOS PCT: 1 %
Basophils Absolute: 0.1 10*3/uL (ref 0.0–0.1)
EOS PCT: 1 %
Eosinophils Absolute: 0.1 10*3/uL (ref 0.0–0.7)
HEMATOCRIT: 36.8 % — AB (ref 39.0–52.0)
HEMOGLOBIN: 11.3 g/dL — AB (ref 13.0–17.0)
LYMPHS PCT: 13 %
Lymphs Abs: 1.2 10*3/uL (ref 0.7–4.0)
MCH: 27.1 pg (ref 26.0–34.0)
MCHC: 30.7 g/dL (ref 30.0–36.0)
MCV: 88.2 fL (ref 78.0–100.0)
Monocytes Absolute: 0.7 10*3/uL (ref 0.1–1.0)
Monocytes Relative: 7 %
NEUTROS ABS: 7.2 10*3/uL (ref 1.7–7.7)
NEUTROS PCT: 78 %
Platelets: 339 10*3/uL (ref 150–400)
RBC: 4.17 MIL/uL — ABNORMAL LOW (ref 4.22–5.81)
RDW: 15.4 % (ref 11.5–15.5)
WBC: 9.3 10*3/uL (ref 4.0–10.5)

## 2015-02-24 LAB — BASIC METABOLIC PANEL
ANION GAP: 13 (ref 5–15)
BUN: 75 mg/dL — ABNORMAL HIGH (ref 6–20)
CALCIUM: 7 mg/dL — AB (ref 8.9–10.3)
CO2: 26 mmol/L (ref 22–32)
Chloride: 104 mmol/L (ref 101–111)
Creatinine, Ser: 4.41 mg/dL — ABNORMAL HIGH (ref 0.61–1.24)
GFR, EST AFRICAN AMERICAN: 12 mL/min — AB (ref >60)
GFR, EST NON AFRICAN AMERICAN: 10 mL/min — AB (ref >60)
Glucose, Bld: 155 mg/dL — ABNORMAL HIGH (ref 65–99)
Potassium: 5.4 mmol/L — ABNORMAL HIGH (ref 3.5–5.1)
Sodium: 143 mmol/L (ref 135–145)

## 2015-02-24 LAB — COMPREHENSIVE METABOLIC PANEL
ALBUMIN: 2 g/dL — AB (ref 3.5–5.0)
ALK PHOS: 80 U/L (ref 38–126)
ALT: 14 U/L — AB (ref 17–63)
ANION GAP: 13 (ref 5–15)
AST: 26 U/L (ref 15–41)
BUN: 77 mg/dL — ABNORMAL HIGH (ref 6–20)
CHLORIDE: 105 mmol/L (ref 101–111)
CO2: 27 mmol/L (ref 22–32)
CREATININE: 4.53 mg/dL — AB (ref 0.61–1.24)
Calcium: 7.1 mg/dL — ABNORMAL LOW (ref 8.9–10.3)
GFR calc non Af Amer: 10 mL/min — ABNORMAL LOW (ref >60)
GFR, EST AFRICAN AMERICAN: 12 mL/min — AB (ref >60)
GLUCOSE: 46 mg/dL — AB (ref 65–99)
Potassium: 5.4 mmol/L — ABNORMAL HIGH (ref 3.5–5.1)
SODIUM: 145 mmol/L (ref 135–145)
Total Bilirubin: 0.1 mg/dL — ABNORMAL LOW (ref 0.3–1.2)
Total Protein: 6.1 g/dL — ABNORMAL LOW (ref 6.5–8.1)

## 2015-02-24 LAB — URINALYSIS, ROUTINE W REFLEX MICROSCOPIC
Glucose, UA: NEGATIVE mg/dL
KETONES UR: 15 mg/dL — AB
Nitrite: NEGATIVE
Protein, ur: 100 mg/dL — AB
SPECIFIC GRAVITY, URINE: 1.021 (ref 1.005–1.030)
pH: 5 (ref 5.0–8.0)

## 2015-02-24 LAB — I-STAT CG4 LACTIC ACID, ED
LACTIC ACID, VENOUS: 1.44 mmol/L (ref 0.5–2.0)
Lactic Acid, Venous: 1.81 mmol/L (ref 0.5–2.0)

## 2015-02-24 LAB — CBG MONITORING, ED
GLUCOSE-CAPILLARY: 96 mg/dL (ref 65–99)
Glucose-Capillary: 37 mg/dL — CL (ref 65–99)
Glucose-Capillary: 147 mg/dL — ABNORMAL HIGH (ref 65–99)

## 2015-02-24 LAB — MRSA PCR SCREENING: MRSA BY PCR: NEGATIVE

## 2015-02-24 LAB — CK: CK TOTAL: 63 U/L (ref 49–397)

## 2015-02-24 MED ORDER — CEFTRIAXONE SODIUM 1 G IJ SOLR
1.0000 g | Freq: Once | INTRAMUSCULAR | Status: AC
  Administered 2015-02-24: 1 g via INTRAVENOUS
  Filled 2015-02-24: qty 10

## 2015-02-24 MED ORDER — INSULIN ASPART 100 UNIT/ML ~~LOC~~ SOLN
0.0000 [IU] | SUBCUTANEOUS | Status: DC
Start: 2015-02-24 — End: 2015-02-24

## 2015-02-24 MED ORDER — DEXTROSE-NACL 5-0.45 % IV SOLN
INTRAVENOUS | Status: DC
  Administered 2015-02-24: 1000 mL via INTRAVENOUS
  Administered 2015-02-25 – 2015-02-28 (×6): via INTRAVENOUS

## 2015-02-24 MED ORDER — SODIUM CHLORIDE 0.9% FLUSH
3.0000 mL | Freq: Two times a day (BID) | INTRAVENOUS | Status: DC
  Administered 2015-02-24 – 2015-02-28 (×6): 3 mL via INTRAVENOUS

## 2015-02-24 MED ORDER — KCL IN DEXTROSE-NACL 20-5-0.45 MEQ/L-%-% IV SOLN
Freq: Once | INTRAVENOUS | Status: DC
Start: 2015-02-24 — End: 2015-02-24
  Filled 2015-02-24: qty 1000

## 2015-02-24 MED ORDER — DEXTROSE 50 % IV SOLN
50.0000 mL | Freq: Once | INTRAVENOUS | Status: AC
  Administered 2015-02-24: 50 mL via INTRAVENOUS

## 2015-02-24 MED ORDER — SODIUM CHLORIDE 0.9 % IV BOLUS (SEPSIS)
500.0000 mL | Freq: Once | INTRAVENOUS | Status: AC
  Administered 2015-02-24: 500 mL via INTRAVENOUS

## 2015-02-24 MED ORDER — ENOXAPARIN SODIUM 30 MG/0.3ML ~~LOC~~ SOLN
30.0000 mg | SUBCUTANEOUS | Status: DC
  Administered 2015-02-24 – 2015-02-27 (×4): 30 mg via SUBCUTANEOUS
  Filled 2015-02-24 (×4): qty 0.3

## 2015-02-24 MED ORDER — DEXTROSE 5 % IV SOLN
1.0000 g | INTRAVENOUS | Status: DC
  Administered 2015-02-25: 1 g via INTRAVENOUS
  Filled 2015-02-24: qty 10

## 2015-02-24 MED ORDER — DEXTROSE 50 % IV SOLN
INTRAVENOUS | Status: AC
  Filled 2015-02-24: qty 50

## 2015-02-24 MED ORDER — ACETAMINOPHEN 650 MG RE SUPP: 650.0000 mg | Freq: Four times a day (QID) | RECTAL | Status: DC | PRN

## 2015-02-24 MED ORDER — ACETAMINOPHEN 325 MG PO TABS: 650.0000 mg | ORAL_TABLET | Freq: Four times a day (QID) | ORAL | Status: DC | PRN

## 2015-02-24 MED ORDER — MORPHINE SULFATE (PF) 2 MG/ML IV SOLN
0.5000 mg | INTRAVENOUS | Status: DC | PRN
  Administered 2015-02-27: 0.5 mg via INTRAVENOUS
  Filled 2015-02-24: qty 1

## 2015-02-24 MED ORDER — COLLAGENASE 250 UNIT/GM EX OINT
1.0000 | TOPICAL_OINTMENT | Freq: Every day | CUTANEOUS | Status: DC
Start: 2015-02-24 — End: 2015-02-28
  Administered 2015-02-24 – 2015-02-28 (×5): 1 via TOPICAL
  Filled 2015-02-24: qty 30

## 2015-02-24 MED ORDER — SODIUM CHLORIDE 0.9 % IV BOLUS (SEPSIS)
1000.0000 mL | Freq: Once | INTRAVENOUS | Status: AC
  Administered 2015-02-24: 1000 mL via INTRAVENOUS

## 2015-02-24 NOTE — ED Notes (Signed)
PHLEBOTOMY AT BEDSIDE.

## 2015-02-24 NOTE — ED Provider Notes (Signed)
CSN: [id number]     Arrival date & time 02/24/15  1129 History   First MD Initiated Contact with Patient 02/24/15 1129     Chief Complaint  Patient presents with  . Hypoglycemia     (Consider location/radiation/quality/duration/timing/severity/associated sxs/prior Treatment) Patient is a 82 y.o. male presenting with altered mental status.  Altered Mental Status Presenting symptoms: confusion and lethargy   Severity:  Mild Most recent episode:  Today Timing:  Constant Progression:  Worsening Chronicity:  New Context: nursing home resident and recent change in medication   Context: not drug use     Past Medical History  Diagnosis Date  . Hypertension   . Hyperlipidemia   . Diabetes mellitus   . Coronary artery disease     s/p CABG 1996  . Sick sinus syndrome (HCC)     s/p PPM 2001 (R sided),  RV lead fracture 10/12 with new system (MDT) placed on L side due to R sided venous occlusion  . Diabetic neuropathy (HCC)   . CVD (cardiovascular disease)     s/p CEA  . Peripheral vascular disease (HCC)   . Chronic kidney disease   . BPH (benign prostatic hyperplasia)   . Hypercholesteremia   . Myocardial infarction (HCC)   . CHF (congestive heart failure) (HCC)   . Ulcer of toe (HCC)   . Ulcer of heel and midfoot (HCC)   . Presence of permanent cardiac pacemaker    Past Surgical History  Procedure Laterality Date  . Coronary artery bypass graft    . Carotid endarterectomy    . Pacemaker insertion      s/p PPM 2001 (R sided),  RV lead fracture 10/12 with new system (MDT) placed on Left side due to R sided venous occlusion  . Hernia repair    . Cataract extraction w/ intraocular lens implant Bilateral   . Insert / replace / remove pacemaker    . Wound debridement Left 07/28/2014    Procedure: DEBRIDEMENT WOUND/LEFT HEEL DEBRIDMENT;  Surgeon: Annice Needy, MD;  Location: [facility] ORS;  Service: Vascular;  Laterality: Left;  . Application of wound vac Left 07/28/2014    Procedure:  APPLICATION OF WOUND VAC;  Surgeon: Annice Needy, MD;  Location: [facility] ORS;  Service: Vascular;  Laterality: Left;  . Amputation Right 12/21/2014    Procedure: AMPUTATION RAY/RIGHT;  Surgeon: Nadara Mustard, MD;  Location: [facility] OR;  Service: Orthopedics;  Laterality: Right;  . Amputation Right 02/10/2015    Procedure: Right Below Knee Amputation;  Surgeon: Nadara Mustard, MD;  Location: [facility] OR;  Service: Orthopedics;  Laterality: Right;   Family History  Problem Relation Age of Onset  . CAD Brother    Social History  Substance Use Topics  . Smoking status: Former Smoker    Quit date: 11/07/1975  . Smokeless tobacco: Never Used  . Alcohol Use: No    Review of Systems  Unable to perform ROS: Mental status change  Psychiatric/Behavioral: Positive for confusion.  All other systems reviewed and are negative.     Allergies  Ace inhibitors and Ciprofloxacin  Home Medications   Prior to Admission medications   Medication Sig Start Date End Date Taking? Authorizing Provider  acetaminophen (TYLENOL) 650 MG suppository Place 650 mg rectally every 4 (four) hours as needed.   Yes Historical Provider, MD  aspirin 81 MG tablet Take 81 mg by mouth daily.   Yes Historical Provider, MD  bisacodyl (DULCOLAX) 10 MG suppository Place 10 mg rectally  as needed for moderate constipation.   Yes Historical Provider, MD  docusate sodium (COLACE) 100 MG capsule Take 100 mg by mouth 2 (two) times daily.   Yes Historical Provider, MD  furosemide (LASIX) 40 MG tablet Take 40 mg by mouth daily.  06/01/13  Yes Historical Provider, MD  glipiZIDE (GLUCOTROL) 5 MG tablet Take 5 mg by mouth 2 (two) times daily before a meal.    Yes Historical Provider, MD  guaiFENesin (MUCINEX) 600 MG 12 hr tablet Take 600 mg by mouth 2 (two) times daily.   Yes Historical Provider, MD  HYDROcodone-acetaminophen (NORCO/VICODIN) 5-325 MG tablet Take 1 tablet by mouth every 6 (six) hours as needed for moderate pain.   Yes Historical  Provider, MD  ondansetron (ZOFRAN) 4 MG tablet Take 4 mg by mouth every 6 (six) hours as needed for nausea or vomiting.   Yes Historical Provider, MD  pentoxifylline (TRENTAL) 400 MG CR tablet Take 400 mg by mouth 3 (three) times daily with meals.   Yes Historical Provider, MD  simvastatin (ZOCOR) 40 MG tablet Take 40 mg by mouth every evening.    Yes Historical Provider, MD  sulfamethoxazole-trimethoprim (BACTRIM DS,SEPTRA DS) 800-160 MG tablet Take 1 tablet by mouth 2 (two) times daily.   Yes Historical Provider, MD  Tamsulosin HCl (FLOMAX) 0.4 MG CAPS Take 0.4 mg by mouth every evening.    Yes Historical Provider, MD  acetaminophen (TYLENOL) 325 MG tablet Take 2 tablets (650 mg total) by mouth every 6 (six) hours as needed for mild pain (or Fever >/= 101). 02/13/15   Nadara Mustard, MD  collagenase (SANTYL) ointment Apply 1 application topically daily.    Historical Provider, MD  methocarbamol (ROBAXIN) 500 MG tablet Take 1 tablet (500 mg total) by mouth every 8 (eight) hours as needed for muscle spasms. 12/23/14   Vassie Loll, MD  metoprolol tartrate (LOPRESSOR) 25 MG tablet Take 25 mg by mouth 2 (two) times daily.      Historical Provider, MD  Multiple Vitamins-Minerals (MENS MULTIVITAMIN PLUS PO) Take 1 tablet by mouth daily.    Historical Provider, MD  nitroGLYCERIN (NITRODUR - DOSED IN MG/24 HR) 0.2 mg/hr patch Place 0.2 mg onto the skin daily.    Historical Provider, MD  Probiotic Product (PHILLIPS COLON HEALTH PO) Take 1 capsule by mouth daily.    Historical Provider, MD   BP 105/43 mmHg  Pulse 71  Temp(Src) 98.1 F (36.7 C) (Oral)  Resp 16  Wt 173 lb 12.8 oz (78.835 kg)  SpO2 97% Physical Exam  Constitutional: He appears well-developed and well-nourished.  HENT:  Head: Normocephalic and atraumatic.  Eyes: Pupils are equal, round, and reactive to light.  Neck: Normal range of motion.  Cardiovascular: Normal rate.   Pulmonary/Chest: Effort normal. No respiratory distress. He has  no wheezes. He has rales.  Abdominal: Soft. He exhibits no distension. There is no tenderness. There is no rebound.  Musculoskeletal: Normal range of motion. He exhibits no edema or tenderness.  Neurological: No cranial nerve deficit. Coordination normal.  Skin: Skin is warm and dry.  Nursing note and vitals reviewed.   ED Course  Procedures (including critical care time)  CRITICAL CARE Performed by: Marily Memos   Total critical care time: 30 minutes Critical care time was exclusive of separately billable procedures and treating other patients. Critical care was necessary to treat or prevent imminent or life-threatening deterioration. Critical care was time spent personally by me on the following activities: development of treatment  plan with patient and/or surrogate as well as nursing, discussions with consultants, evaluation of patient's response to treatment, examination of patient, obtaining history from patient or surrogate, ordering and performing treatments and interventions, ordering and review of laboratory studies, ordering and review of radiographic studies, pulse oximetry and re-evaluation of patient's condition.   Labs Review Labs Reviewed  COMPREHENSIVE METABOLIC PANEL - Abnormal; Notable for the following:    Potassium 5.4 (*)    Glucose, Bld 46 (*)    BUN 77 (*)    Creatinine, Ser 4.53 (*)    Calcium 7.1 (*)    Total Protein 6.1 (*)    Albumin 2.0 (*)    ALT 14 (*)    Total Bilirubin 0.1 (*)    GFR calc non Af Amer 10 (*)    GFR calc Af Amer 12 (*)    All other components within normal limits  CBC WITH DIFFERENTIAL/PLATELET - Abnormal; Notable for the following:    RBC 4.17 (*)    Hemoglobin 11.3 (*)    HCT 36.8 (*)    All other components within normal limits  URINALYSIS, ROUTINE W REFLEX MICROSCOPIC (NOT AT [facility]) - Abnormal; Notable for the following:    Color, Urine BROWN (*)    APPearance TURBID (*)    Hgb urine dipstick LARGE (*)    Bilirubin Urine  SMALL (*)    Ketones, ur 15 (*)    Protein, ur 100 (*)    Leukocytes, UA MODERATE (*)    All other components within normal limits  URINE MICROSCOPIC-ADD ON - Abnormal; Notable for the following:    Squamous Epithelial / LPF 0-5 (*)    Bacteria, UA MANY (*)    All other components within normal limits  HEMOGLOBIN A1C - Abnormal; Notable for the following:    Hgb A1c MFr Bld 7.5 (*)    All other components within normal limits  BASIC METABOLIC PANEL - Abnormal; Notable for the following:    Potassium 5.4 (*)    Glucose, Bld 155 (*)    BUN 75 (*)    Creatinine, Ser 4.41 (*)    Calcium 7.0 (*)    GFR calc non Af Amer 10 (*)    GFR calc Af Amer 12 (*)    All other components within normal limits  CBC - Abnormal; Notable for the following:    RBC 4.00 (*)    Hemoglobin 10.7 (*)    HCT 34.9 (*)    All other components within normal limits  BASIC METABOLIC PANEL - Abnormal; Notable for the following:    Potassium 5.3 (*)    Glucose, Bld 172 (*)    BUN 67 (*)    Creatinine, Ser 3.94 (*)    Calcium 6.9 (*)    GFR calc non Af Amer 12 (*)    GFR calc Af Amer 14 (*)    All other components within normal limits  CBC - Abnormal; Notable for the following:    RBC 3.76 (*)    Hemoglobin 10.1 (*)    HCT 33.3 (*)    All other components within normal limits  GLUCOSE, CAPILLARY - Abnormal; Notable for the following:    Glucose-Capillary 142 (*)    All other components within normal limits  GLUCOSE, CAPILLARY - Abnormal; Notable for the following:    Glucose-Capillary 162 (*)    All other components within normal limits  GLUCOSE, CAPILLARY - Abnormal; Notable for the following:    Glucose-Capillary 154 (*)  All other components within normal limits  GLUCOSE, CAPILLARY - Abnormal; Notable for the following:    Glucose-Capillary 151 (*)    All other components within normal limits  CBG MONITORING, ED - Abnormal; Notable for the following:    Glucose-Capillary 37 (*)    All other  components within normal limits  CBG MONITORING, ED - Abnormal; Notable for the following:    Glucose-Capillary 147 (*)    All other components within normal limits  MRSA PCR SCREENING  CULTURE, BLOOD (ROUTINE X 2)  CULTURE, BLOOD (ROUTINE X 2)  URINE CULTURE  CK  CBG MONITORING, ED  I-STAT CG4 LACTIC ACID, ED  I-STAT CG4 LACTIC ACID, ED    Imaging Review Dg Chest 2 View  02/24/2015  CLINICAL DATA:  Cough, pneumonia EXAM: CHEST  2 VIEW COMPARISON:  05/12/2014 FINDINGS: Cardiomediastinal silhouette is stable. Bilateral dual lead cardiac pacemaker with leads in right atrium and right ventricle is unchanged in position. Status post CABG. No acute infiltrate or pulmonary edema. Stable pleural thickening and scarring left base. Osteopenia and mild degenerative changes thoracic spine. IMPRESSION: No active disease. Stable chronic changes left base. Status post median sternotomy. Electronically Signed   By: Natasha Mead M.D.   On: 02/24/2015 13:02   I have personally reviewed and evaluated these images and lab results as part of my medical decision-making.   EKG Interpretation   Date/Time:  Friday February 24 2015 11:36:00 EST Ventricular Rate:  83 PR Interval:    QRS Duration: 207 QT Interval:  489 QTC Calculation: 575 R Axis:   -56 Text Interpretation:  Atrial flutter Nonspecific IVCD with LAD Borderline  T abnormalities, lateral leads Baseline wander in lead(s) III V1 no  obvious pacer spikes, morphology similar to 04/2014 Confirmed by [facility] MD,  Barbara Cower [id number]) on 02/24/2015 11:54:15 AM      MDM   Final diagnoses:  Hypoglycemia  UTI (lower urinary tract infection)  Sepsis   82 year old male here with EMS for hypoglycemia and altered mental status. Initial CBG was in the range of 25. Mental status improved some but still sleepy on my examination. Has some rales and rhonchi on lung examination otherwise unremarkable physical exam. He does have very dark urine in his Foley catheter  concerning for possible kidney injury versus infected. Some question that the patient possibly having been on anti-medics recently for pneumonia so a chest x-ray to evaluate for that.  Performed by nursing patient had altered mental status again and recheck a blood sugar was in the 30s so another amp of D50 was given and also started on D5 half-normal drip. Urine looks infected so we'll start Rocephin. Chest x-ray overall appears well, awaiting labs to evaluate kidney function and will likely need admission for continued antibiotics and fluid hydration. AoCKD present, likely 2/2 dehydration/infection, fluid hydration already started, on infusion, will admit to medicine for further eval.     Marily Memos, MD 02/25/15 1048

## 2015-02-24 NOTE — ED Notes (Signed)
Per [facility], Pt is coming from [facility] in [place] and had Hx of BKA on the right leg and was headed to [facility]. When [facility] arrived, pt would not respond and only had eye opening. Pt's CBG was 38 and 87% on RA. Pt was given 1 Ampule of D50 with response of CBG of 215. Pt became more alert and oriented x4. Facility asked for pt to be transported to be assessed for poor intake and "hasn't been acting his normal.". Arriving to the ED: Pt 93% on RA. Vitals per EMS: 110/88, 95 HR.

## 2015-02-24 NOTE — ED Notes (Signed)
Attempted report to 6E 
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
## 2015-02-24 NOTE — H&P (Signed)
[facility] History and Physical                                                                                    [name], is a 82 y.o. male  MRN: [id number]   DOB - Oct 13, 1920  Admit Date - 02/24/2015  Outpatient Primary MD for the patient is Lindwood Qua, MD  Referring MD: [place] / ER  PMH: Past Medical History  Diagnosis Date  . Hypertension   . Hyperlipidemia   . Diabetes mellitus   . Coronary artery disease     s/p CABG 1996  . Sick sinus syndrome (HCC)     s/p PPM 2001 (R sided),  RV lead fracture 10/12 with new system (MDT) placed on L side due to R sided venous occlusion  . Diabetic neuropathy (HCC)   . CVD (cardiovascular disease)     s/p CEA  . Peripheral vascular disease (HCC)   . Chronic kidney disease   . BPH (benign prostatic hyperplasia)   . Hypercholesteremia   . Myocardial infarction (HCC)   . CHF (congestive heart failure) (HCC)   . Ulcer of toe (HCC)   . Ulcer of heel and midfoot (HCC)   . Presence of permanent cardiac pacemaker       PSH: Past Surgical History  Procedure Laterality Date  . Coronary artery bypass graft    . Carotid endarterectomy    . Pacemaker insertion      s/p PPM 2001 (R sided),  RV lead fracture 10/12 with new system (MDT) placed on Left side due to R sided venous occlusion  . Hernia repair    . Cataract extraction w/ intraocular lens implant Bilateral   . Insert / replace / remove pacemaker    . Wound debridement Left 07/28/2014    Procedure: DEBRIDEMENT WOUND/LEFT HEEL DEBRIDMENT;  Surgeon: Annice Needy, MD;  Location: [facility] ORS;  Service: Vascular;  Laterality: Left;  . Application of wound vac Left 07/28/2014    Procedure: APPLICATION OF WOUND VAC;  Surgeon: Annice Needy, MD;  Location: [facility] ORS;  Service: Vascular;  Laterality: Left;  . Amputation Right 12/21/2014    Procedure: AMPUTATION RAY/RIGHT;  Surgeon: Nadara Mustard, MD;  Location: [facility] OR;  Service: Orthopedics;  Laterality: Right;  . Amputation Right  02/10/2015    Procedure: Right Below Knee Amputation;  Surgeon: Nadara Mustard, MD;  Location: [facility] OR;  Service: Orthopedics;  Laterality: Right;     CC:  Chief Complaint  Patient presents with  . Hypoglycemia     HPI:  82 year old male patient recently discharged to skilled nursing facility on 1/23 after admission to the orthopedic service to undergo right BKA status post gangrenous lower extremity. In addition patient has past medical history of chronic kidney disease stage III, hypertension, diabetes on oral agents, chronic diabetic right foot ulcer, dyslipidemia, diabetic peripheral neuropathy, pacemaker and CAD. Patient was transported by ambulance from [facility] in [place] to [facility] ER. Initially the patient was supposed to come to [facility] for scheduled orthopedic service appointment but upon ambulance arrival to the facility patient was minimally responsive. CBG was checked and was 38 and he  was having O2 sat duration of 87% on room air. He was given 1 amp of D50 with increase in CBG to 215 with associated improvement in mentation noting he was now alert and oriented 4. Upon further questioning of staff at the facility it was reported that the patient had not been acting at his baseline and had poor oral intake for several days. In discussion with the patient's son (who is now the bedside) patient has had a Foley catheter in place since the initial surgical procedure. This was done because of urinary incontinence. Son has noticed that the urine has become progressively darker since the patient was admitted to the skilled nursing facility. He believes the patient has had poor oral intake for several days and is unclear as to whether the patient has been receiving frequent doses of pain medications.  ER Evaluation and treatment: Rectal temperature 97.1, initial blood pressure 87/54 and improved to a maximum of 124/85 after fluid resuscitation with current blood pressure 107/51, pulse 71  and respirations 22 with room air saturations between 92 and 98% EKG: Wide complex rhythm without discernible P wave with ventricular rate 83 bpm, QTC 575 ms but leads 1 through aVF very poor tracings; no recent EKGs for comparison so we will repeat today and if needed again in a.m. 2 View CXR: No acute process Laboratory data: K+ 5.4, BUN 77 and creatinine 4.53 with previous readings prior to discharge BUN 25 and creatinine 1.96, glucose 46, albumin 2, initial lactic acid 1.81 with repeat 1.44, WBCs 9300 hemoglobin 11.3, platelets 339,000; urinalysis markedly abnormal with turbid appearance, many bacteria, small bilirubin, brown color, large hemoglobin, 15 ketones, moderate leukocytes, protein 100 RBCs TNTC, specific gravity 1.021, squamous epithelials 0-5, yeast, the PVCs TNTC *Blood cultures and urine culture have been obtained in the ER  Review of Systems   In addition to the HPI above,  No Fever-chills, myalgias or other constitutional symptoms No Headache, changes with Vision or hearing, new weakness, tingling, numbness in any extremity, No problems swallowing food or Liquids, indigestion/reflux No Chest pain, Cough or Shortness of Breath, palpitations, orthopnea or DOE No Abdominal pain, N/V; no melena or hematochezia, no dark tarry stools No gross red hematuria or flank pain No recent weight gain or loss No polyuria, polydypsia or polyphagia,  *A full 10 point Review of Systems was done, except as stated above, all other Review of Systems were negative.  Social History Social History  Substance Use Topics  . Smoking status: Former Smoker    Quit date: 11/07/1975  . Smokeless tobacco: Never Used  . Alcohol Use: No    Resides at: [facility] in [place]  Lives with: N/A  Ambulatory status: None ambulatory since BKA   Family History Family History  Problem Relation Age of Onset  . CAD Brother      Prior to Admission medications   Medication Sig Start Date End  Date Taking? Authorizing Provider  acetaminophen (TYLENOL) 650 MG suppository Place 650 mg rectally every 4 (four) hours as needed.   Yes Historical Provider, MD  aspirin 81 MG tablet Take 81 mg by mouth daily.   Yes Historical Provider, MD  bisacodyl (DULCOLAX) 10 MG suppository Place 10 mg rectally as needed for moderate constipation.   Yes Historical Provider, MD  docusate sodium (COLACE) 100 MG capsule Take 100 mg by mouth 2 (two) times daily.   Yes Historical Provider, MD  furosemide (LASIX) 40 MG tablet Take 40 mg by mouth daily.  06/01/13  Yes Historical Provider, MD  glipiZIDE (GLUCOTROL) 5 MG tablet Take 5 mg by mouth 2 (two) times daily before a meal.    Yes Historical Provider, MD  guaiFENesin (MUCINEX) 600 MG 12 hr tablet Take 600 mg by mouth 2 (two) times daily.   Yes Historical Provider, MD  HYDROcodone-acetaminophen (NORCO/VICODIN) 5-325 MG tablet Take 1 tablet by mouth every 6 (six) hours as needed for moderate pain.   Yes Historical Provider, MD  ondansetron (ZOFRAN) 4 MG tablet Take 4 mg by mouth every 6 (six) hours as needed for nausea or vomiting.   Yes Historical Provider, MD  pentoxifylline (TRENTAL) 400 MG CR tablet Take 400 mg by mouth 3 (three) times daily with meals.   Yes Historical Provider, MD  simvastatin (ZOCOR) 40 MG tablet Take 40 mg by mouth every evening.    Yes Historical Provider, MD  sulfamethoxazole-trimethoprim (BACTRIM DS,SEPTRA DS) 800-160 MG tablet Take 1 tablet by mouth 2 (two) times daily.   Yes Historical Provider, MD  Tamsulosin HCl (FLOMAX) 0.4 MG CAPS Take 0.4 mg by mouth every evening.    Yes Historical Provider, MD  acetaminophen (TYLENOL) 325 MG tablet Take 2 tablets (650 mg total) by mouth every 6 (six) hours as needed for mild pain (or Fever >/= 101). 02/13/15   Nadara Mustard, MD  collagenase (SANTYL) ointment Apply 1 application topically daily.    Historical Provider, MD  methocarbamol (ROBAXIN) 500 MG tablet Take 1 tablet (500 mg total) by mouth  every 8 (eight) hours as needed for muscle spasms. 12/23/14   Vassie Loll, MD  metoprolol tartrate (LOPRESSOR) 25 MG tablet Take 25 mg by mouth 2 (two) times daily.      Historical Provider, MD  Multiple Vitamins-Minerals (MENS MULTIVITAMIN PLUS PO) Take 1 tablet by mouth daily.    Historical Provider, MD  nitroGLYCERIN (NITRODUR - DOSED IN MG/24 HR) 0.2 mg/hr patch Place 0.2 mg onto the skin daily.    Historical Provider, MD  Probiotic Product (PHILLIPS COLON HEALTH PO) Take 1 capsule by mouth daily.    Historical Provider, MD    Allergies  Allergen Reactions  . Ace Inhibitors     Kidney failure  . Ciprofloxacin Rash    Physical Exam  Vitals  Blood pressure 107/51, pulse 72, temperature 97.1 F (36.2 C), temperature source Rectal, resp. rate 21, SpO2 98 %.   General:  In no acute distress, appears stated age  Psych:  Normal affect although keeps eyes closed during conversation, Denies Suicidal or Homicidal ideations, Awake Alert, Oriented X name and place  Neuro:   No obvious focal neurological deficits, CN II through XII intact, Strength 4/5 all 4 extremities, Sensation intact all 4 extremities.  ENT:  Ears and Eyes appear Normal, Conjunctivae clear, PER. Extremely dry oral mucosa without erythema or exudates.  Neck:  Supple, No lymphadenopathy appreciated  Respiratory:  Symmetrical chest wall movement, Good air movement bilaterally, CTAB. Room Air  Cardiac:  RRR, No Murmurs, no LE edema noted, no JVD, No carotid bruits, peripheral pulses palpable at 2+  Abdomen:  Positive bowel sounds, Soft, Non tender, Non distended,  No masses appreciated, no obvious hepatosplenomegaly  Skin:  No Cyanosis, Normal Skin Turgor, No Skin Rash or Bruise. Also has several stage II or 3 sacral decubiti-we'll ask wound care nurse to formally evaluate/size/stage  Extremities: Asymmetrical with occlusive bandage over right BKA site-4 x 4 dressing over left heel ulcer,  no effusions.  Data  Review  CBC  Recent Labs Lab 02/24/15 1420  WBC 9.3  HGB 11.3*  HCT 36.8*  PLT 339  MCV 88.2  MCH 27.1  MCHC 30.7  RDW 15.4  LYMPHSABS 1.2  MONOABS 0.7  EOSABS 0.1  BASOSABS 0.1    Chemistries   Recent Labs Lab 02/24/15 1420  NA 145  K 5.4*  CL 105  CO2 27  GLUCOSE 46*  BUN 77*  CREATININE 4.53*  CALCIUM 7.1*  AST 26  ALT 14*  ALKPHOS 80  BILITOT 0.1*    CrCl cannot be calculated (Unknown ideal weight.).  No results for input(s): TSH, T4TOTAL, T3FREE, THYROIDAB in the last 72 hours.  Invalid input(s): FREET3  Coagulation profile No results for input(s): INR, PROTIME in the last 168 hours.  No results for input(s): DDIMER in the last 72 hours.  Cardiac Enzymes No results for input(s): CKMB, TROPONINI, MYOGLOBIN in the last 168 hours.  Invalid input(s): CK  Invalid input(s): POCBNP  Urinalysis    Component Value Date/Time   COLORURINE BROWN* 02/24/2015 1350   APPEARANCEUR TURBID* 02/24/2015 1350   LABSPEC 1.021 02/24/2015 1350   PHURINE 5.0 02/24/2015 1350   GLUCOSEU NEGATIVE 02/24/2015 1350   HGBUR LARGE* 02/24/2015 1350   BILIRUBINUR SMALL* 02/24/2015 1350   KETONESUR 15* 02/24/2015 1350   PROTEINUR 100* 02/24/2015 1350   NITRITE NEGATIVE 02/24/2015 1350   LEUKOCYTESUR MODERATE* 02/24/2015 1350    Imaging results:   Dg Chest 2 View  02/24/2015  CLINICAL DATA:  Cough, pneumonia EXAM: CHEST  2 VIEW COMPARISON:  05/12/2014 FINDINGS: Cardiomediastinal silhouette is stable. Bilateral dual lead cardiac pacemaker with leads in right atrium and right ventricle is unchanged in position. Status post CABG. No acute infiltrate or pulmonary edema. Stable pleural thickening and scarring left base. Osteopenia and mild degenerative changes thoracic spine. IMPRESSION: No active disease. Stable chronic changes left base. Status post median sternotomy. Electronically Signed   By: Natasha Mead M.D.   On: 02/24/2015 13:02     EKG: (Independently reviewed) Wide  complex rhythm without discernible P wave with ventricular rate 83 bpm, QTC 575 ms but leads 1 through aVF very poor tracings; no recent EKGs for comparison so we will repeat today and if needed again in a.m.    Assessment & Plan  Principal Problem:   Acute renal failure superimposed on stage 3 chronic kidney disease 2/2 diabetic nephropathy -Suspect etiology from failure to thrive in elderly patient requiring pain medications and having uncontrolled postoperative pain (shunt report) in setting of ongoing use of diuretics -DO NOT RESUSCITATE -Admit to telemetry/inpatient -Volume resuscitation -Potassium elevated at 5.4 but with associated hypoglycemia am reluctant to utilize cardioprotective medications of insulin and D50 so we'll repeat electrolyte panel at 5 PM since has received volume resuscitation -Repeat labs in a.m. -Hold diuretics -Correct hypotension to minimize decreased renal perfusion  Active Problems:   Dehydration, severe -As above -Volume resuscitation    Foley catheter in place on admission/Abnormal urinalysis -Foley catheter placed about 2 weeks ago at time of previous admission and was placed because of urinary incontinence -Current urinalysis abnormal and could be representative of UTI but also may be representative of severe volume depletion -Given Rocephin in the ER and we will continue empirically until cultures have returned -Preadmission Flomax on hold  -We'll eventually need to begin bladder training so can remove Foley catheter; incontinence remains a problem consider condom catheter    Essential HTN -Current blood pressures remain suboptimal so preadmission antihypertensive agents remain on hold  DM, type 2 /diabetic hypoglycemia -Hold glyburide and given patient's advanced age and severity of chronic kidney disease would not resume this medication -ck CBG every 4 hours -Dextrose IV fluids until CBGs consistently stable and not dipping back into the  20-40 range -Allow regular diet at this juncture    Diabetic ulcer of right foot associated with type 2 diabetes mellitus  -WOC RN eval -Continue previous wound care    S/P BKA unilateral/Diabetic peripheral neuropathy  -Current occlusive dressing appears intact and patient does not have signs of infectious process regarding wounds -Consider notifying Dr. Lajoyce Corners patient admission since he missed scheduled OP appointment on date of admission -Low-dose IV morphine every 2 hours when necessary severe pain    HLD  -Given severity of dehydration and acute renal failure agree with EDP to check CK level -Hold statin for now    Pacemaker-Medtronic    CAD (coronary artery disease)    DVT Prophylaxis: Renally dose adjusted Lovenox  Family Communication:  Son at bedside   Code Status:  DO NOT RESUSCITATE  Condition:  Guarded  Discharge disposition: Anticipate return to skilled nursing facility  Time spent in minutes : 60      Kaleesi Guyton L. ANP on 02/24/2015 at 4:40 PM  You may contact me by going to [email or web address] - password [email or web address]  I am available from 7a-7p but please confirm I am on the schedule by going to Amion as above.   After 7p please contact night coverage person covering me after hours  [facility]

## 2015-02-24 NOTE — ED Notes (Signed)
Patient transported to X-ray 

## 2015-02-25 LAB — URINE CULTURE

## 2015-02-25 LAB — CBC
HCT: 33.3 % — ABNORMAL LOW (ref 39.0–52.0)
Hemoglobin: 10.1 g/dL — ABNORMAL LOW (ref 13.0–17.0)
MCH: 26.9 pg (ref 26.0–34.0)
MCHC: 30.3 g/dL (ref 30.0–36.0)
MCV: 88.6 fL (ref 78.0–100.0)
PLATELETS: 310 10*3/uL (ref 150–400)
RBC: 3.76 MIL/uL — AB (ref 4.22–5.81)
RDW: 15.4 % (ref 11.5–15.5)
WBC: 8.1 10*3/uL (ref 4.0–10.5)

## 2015-02-25 LAB — BASIC METABOLIC PANEL
Anion gap: 10 (ref 5–15)
BUN: 67 mg/dL — ABNORMAL HIGH (ref 6–20)
CHLORIDE: 105 mmol/L (ref 101–111)
CO2: 26 mmol/L (ref 22–32)
CREATININE: 3.94 mg/dL — AB (ref 0.61–1.24)
Calcium: 6.9 mg/dL — ABNORMAL LOW (ref 8.9–10.3)
GFR, EST AFRICAN AMERICAN: 14 mL/min — AB (ref >60)
GFR, EST NON AFRICAN AMERICAN: 12 mL/min — AB (ref >60)
Glucose, Bld: 172 mg/dL — ABNORMAL HIGH (ref 65–99)
POTASSIUM: 5.3 mmol/L — AB (ref 3.5–5.1)
SODIUM: 141 mmol/L (ref 135–145)

## 2015-02-25 LAB — GLUCOSE, CAPILLARY
Glucose-Capillary: 151 mg/dL — ABNORMAL HIGH (ref 65–99)
Glucose-Capillary: 154 mg/dL — ABNORMAL HIGH (ref 65–99)
Glucose-Capillary: 162 mg/dL — ABNORMAL HIGH (ref 65–99)
Glucose-Capillary: 164 mg/dL — ABNORMAL HIGH (ref 65–99)
Glucose-Capillary: 184 mg/dL — ABNORMAL HIGH (ref 65–99)
Glucose-Capillary: 187 mg/dL — ABNORMAL HIGH (ref 65–99)

## 2015-02-25 LAB — HEMOGLOBIN A1C
Hgb A1c MFr Bld: 7.5 % — ABNORMAL HIGH (ref 4.8–5.6)
MEAN PLASMA GLUCOSE: 169 mg/dL

## 2015-02-25 MED ORDER — FLUCONAZOLE 100 MG PO TABS: 100.0000 mg | ORAL_TABLET | Freq: Every day | ORAL | Status: DC

## 2015-02-25 MED ORDER — FLUCONAZOLE 100 MG PO TABS
100.0000 mg | ORAL_TABLET | Freq: Every day | ORAL | Status: DC
  Administered 2015-02-25 – 2015-02-28 (×4): 100 mg via ORAL
  Filled 2015-02-25 (×4): qty 1

## 2015-02-25 MED ORDER — RESOURCE THICKENUP CLEAR PO POWD
ORAL | Status: DC | PRN
  Filled 2015-02-25: qty 125

## 2015-02-25 MED ORDER — ADULT MULTIVITAMIN W/MINERALS CH
1.0000 | ORAL_TABLET | Freq: Every day | ORAL | Status: DC
  Administered 2015-02-25 – 2015-02-28 (×4): 1 via ORAL
  Filled 2015-02-25 (×4): qty 1

## 2015-02-25 NOTE — Progress Notes (Signed)
Pharmacy Antibiotic Note [name] is a 82 y.o. male admitted on 02/24/2015 with hypoglycemia and found to abnormal urinalysis in setting of chronic foley. Pharmacy has been consulted for Fluconazole dosing.  Plan: Fluconazole 100 mg PO daily for 14 days if yeast in urine felt to be pathogenic   Weight: 173 lb 12.8 oz (78.835 kg)  Temp (24hrs), Avg:98.2 F (36.8 C), Min:97.8 F (36.6 C), Max:99 F (37.2 C)   Recent Labs Lab 02/24/15 1330 02/24/15 1420 02/24/15 1429 02/24/15 1653 02/25/15 0528  WBC  --  9.3  --  8.6 8.1  CREATININE  --  4.53*  --  4.41* 3.94*  LATICACIDVEN 1.81  --  1.44  --   --      Antimicrobials this admission: 2/3 Ceftriaxone >> 2/4 2/4 Fluconazole >>  Dose adjustments this admission: n/a  Microbiology results: 2/3 BCx: ngtd 2/3 UCx: Yeast   Thank you for allowing pharmacy to be a part of this patient's care.  Pollyann Samples, PharmD, BCPS 02/25/2015, 4:47 PM Pager: [phone]

## 2015-02-25 NOTE — Progress Notes (Signed)
Initial Nutrition Assessment   INTERVENTION:  Provide Magic cup TID with meals, each supplement provides 290 kcal and 9 grams of protein Provide Multivitamin with minerals daily  NUTRITION DIAGNOSIS:   Inadequate oral intake related to poor appetite as evidenced by meal completion < 50%, per patient/family report.   GOAL:   Patient will meet greater than or equal to 90% of their needs   MONITOR:   PO intake, Supplement acceptance, Labs, Weight trends, Skin  REASON FOR ASSESSMENT:   Consult Poor PO  ASSESSMENT:   82 year old male with a history of right BKA, CK D, stage III, hypertension, diabetes, who presented to the emergency bar with complaints of hypoglycemia. Patient was discharged from the skilled nursing facility on 02/13/2015 after admission for his right BKA. He was noted to be hypoglycemic upon admission as well as Acute on chronic kidney disease. Suspected to be secondary to diuresis and dehydration.  Pt reports having a good appetite. He ate about 40% of his lunch which he states is normal for him. He reports eating poorly for 6 weeks PTA while at nursing facility; family at bedside states he just started refusing to eat. Pt denies any nausea or abdominal pain. Pt was weighing around 200 lbs prior to BKA. He feels his clothes fit the same. Pt has some mild fat wasting and moderate muscle wasting per physical exam, but overall appears well nourished. RD emphasized the importance of healthful adequate PO intake; encouraged >75% meal completion. Pt agreeable to receiving nutritional supplements.  Labs: high BUN/creatinine, low calcium, low GFR, glucose ranging 43 to 172 mg/dL, low hemoglobin  Diet Order:  DIET DYS 2 Room service appropriate?: Yes; Fluid consistency:: Honey Thick  Skin:  Wound (see comment) (Stage II PU sacrum, Stage III PU buttocks, L heel ulcer)  Last BM:  PTA  Height:   Ht Readings from Last 1 Encounters:  02/10/15 5' 10.5" (1.791 m)     Weight:   Wt Readings from Last 1 Encounters:  02/24/15 173 lb 12.8 oz (78.835 kg)    Ideal Body Weight:  72.2 kg  BMI:  Body mass index is 24.58 kg/(m^2).  Estimated Nutritional Needs:   Kcal:  1900-2100  Protein:  90-100 grams  Fluid:  1.9 L/day  EDUCATION NEEDS:   No education needs identified at this time  Dorothea Ogle RD, LDN Inpatient Clinical Dietitian Pager: [phone] After Hours Pager: [phone]

## 2015-02-25 NOTE — Progress Notes (Signed)
[facility] PROGRESS NOTE  KAMRON VANWYHE FAO:[id number] DOB: [birth year] DOA: 02/24/2015 PCP: Lindwood Qua, MD  Assessment/Plan:  Principal Problem:     Dehydration, severe - Continue IVF rehydration  Acute renal failure superimposed on stage 3 chronic kidney disease (HCC) - Monitor serum creatinine most likely elevated due to prerenal etiology from dehydration  UTI - urine culture growing yeast - d/c rocephin - consult pharmacist for diflucan  Active Problems:   Diabetic peripheral neuropathy (HCC)   HLD (hyperlipidemia)   Essential hypertension - Patient has had hypotension secondary to dehydration. - Holding beta blocker secondary to soft blood pressures    Pacemaker-Medtronic   DM (diabetes mellitus), type 2 with renal complications (HCC) - We'll continue to monitor serum glucose levels - Place on dysphagia 2 diabetic diet    CAD (coronary artery disease)   Diabetic ulcer of right foot associated with type 2 diabetes mellitus (HCC)   S/P BKA (below knee amputation) unilateral (HCC)    Foley catheter in place on admission   Code Status: DNR Family Communication: d/c patient directly Disposition Plan: Pending improvement in condition   Consultants:  None  Procedures:  none  Antibiotics:  Rocephin>>2/4  Antifungal - diflucan  HPI/Subjective: Pt has no new complaints  Objective: Filed Vitals:   02/25/15 0408 02/25/15 0754  BP: 101/39 105/43  Pulse: 72 71  Temp: 98 F (36.7 C) 98.1 F (36.7 C)  Resp: 18 16    Intake/Output Summary (Last 24 hours) at 02/25/15 1617 Last data filed at 02/25/15 1220  Gross per 24 hour  Intake   1885 ml  Output     75 ml  Net   1810 ml   Filed Weights   02/24/15 1615 02/24/15 2028  Weight: 90.7 kg (199 lb 15.3 oz) 78.835 kg (173 lb 12.8 oz)    Exam:   General:  Pt in nad, alert and awake  Cardiovascular: rrr, no mrg  Respiratory: cta bl, no wheezes  Abdomen: soft, nt, nd  Musculoskeletal:  no cyanosis on limited exam  Data Reviewed: Basic Metabolic Panel:  Recent Labs Lab 02/24/15 1420 02/24/15 1653 02/25/15 0528  NA 145 143 141  K 5.4* 5.4* 5.3*  CL 105 104 105  CO2 GLUCOSE 46* 155* 172*  BUN 77* 75* 67*  CREATININE 4.53* 4.41* 3.94*  CALCIUM 7.1* 7.0* 6.9*   Liver Function Tests:  Recent Labs Lab 02/24/15 1420  AST 26  ALT 14*  ALKPHOS 80  BILITOT 0.1*  PROT 6.1*  ALBUMIN 2.0*   No results for input(s): LIPASE, AMYLASE in the last 168 hours. No results for input(s): AMMONIA in the last 168 hours. CBC:  Recent Labs Lab 02/24/15 1420 02/24/15 1653 02/25/15 0528  WBC 9.3 8.6 8.1  NEUTROABS 7.2  --   --   HGB 11.3* 10.7* 10.1*  HCT 36.8* 34.9* 33.3*  MCV 88.2 87.3 88.6  PLT 339 333 310   Cardiac Enzymes:  Recent Labs Lab 02/24/15 1639  CKTOTAL 63   BNP (last 3 results) No results for input(s): BNP in the last 8760 hours.  ProBNP (last 3 results) No results for input(s): PROBNP in the last 8760 hours.  CBG:  Recent Labs Lab 02/24/15 2027 02/25/15 0009 02/25/15 0411 02/25/15 0756 02/25/15 1201  GLUCAP 142* 162* 154* 151* 164*    Recent Results (from the past 240 hour(s))  Blood Culture (routine x 2)     Status: None (Preliminary result)   Collection Time: 02/24/15  1:24 PM  Result Value Ref Range Status   Specimen Description BLOOD LEFT HAND  Final   Special Requests BOTTLES DRAWN AEROBIC ONLY 5CC  Final   Culture NO GROWTH 1 DAY  Final   Report Status PENDING  Incomplete  Urine culture     Status: None   Collection Time: 02/24/15  1:50 PM  Result Value Ref Range Status   Specimen Description URINE, CATHETERIZED  Final   Special Requests NONE  Final   Culture >=100,000 COLONIES/mL YEAST  Final   Report Status 02/25/2015 FINAL  Final  Blood Culture (routine x 2)     Status: None (Preliminary result)   Collection Time: 02/24/15  2:23 PM  Result Value Ref Range Status   Specimen Description BLOOD RIGHT HAND   Final   Special Requests IN PEDIATRIC BOTTLE 3CC  Final   Culture NO GROWTH < 24 HOURS  Final   Report Status PENDING  Incomplete  MRSA PCR Screening     Status: None   Collection Time: 02/24/15  6:06 PM  Result Value Ref Range Status   MRSA by PCR NEGATIVE NEGATIVE Final    Comment:        The GeneXpert MRSA Assay (FDA approved for NASAL specimens only), is one component of a comprehensive MRSA colonization surveillance program. It is not intended to diagnose MRSA infection nor to guide or monitor treatment for MRSA infections.      Studies: Dg Chest 2 View  02/24/2015  CLINICAL DATA:  Cough, pneumonia EXAM: CHEST  2 VIEW COMPARISON:  05/12/2014 FINDINGS: Cardiomediastinal silhouette is stable. Bilateral dual lead cardiac pacemaker with leads in right atrium and right ventricle is unchanged in position. Status post CABG. No acute infiltrate or pulmonary edema. Stable pleural thickening and scarring left base. Osteopenia and mild degenerative changes thoracic spine. IMPRESSION: No active disease. Stable chronic changes left base. Status post median sternotomy. Electronically Signed   By: Natasha Mead M.D.   On: 02/24/2015 13:02    Scheduled Meds: . cefTRIAXone (ROCEPHIN)  IV  1 g Intravenous Q24H  . collagenase  1 application Topical Daily  . enoxaparin (LOVENOX) injection  30 mg Subcutaneous Q24H  . multivitamin with minerals  1 tablet Oral Daily  . sodium chloride flush  3 mL Intravenous Q12H   Continuous Infusions: . dextrose 5 % and 0.45% NaCl 100 mL/hr at 02/25/15 1117    Time spent: > 35   Renesme Kerrigan, [facility]  [facility] Pager [phone]. If 7PM-7AM, please contact night-coverage at [email or web address], password [email or web address] 02/25/2015, 4:17 PM  LOS: 1 day

## 2015-02-25 NOTE — Evaluation (Signed)
Clinical/Bedside Swallow Evaluation Patient Details  Name: [name] MRN: [id number] Date of Birth: Sep 13, 1920  Today's Date: 02/25/2015 Time: SLP Start Time (ACUTE ONLY): 1140 SLP Stop Time (ACUTE ONLY): 1206 SLP Time Calculation (min) (ACUTE ONLY): 26 min  Past Medical History:  Past Medical History  Diagnosis Date  . Hypertension   . Hyperlipidemia   . Diabetes mellitus   . Coronary artery disease     s/p CABG 1996  . Sick sinus syndrome (HCC)     s/p PPM 2001 (R sided),  RV lead fracture 10/12 with new system (MDT) placed on L side due to R sided venous occlusion  . Diabetic neuropathy (HCC)   . CVD (cardiovascular disease)     s/p CEA  . Peripheral vascular disease (HCC)   . Chronic kidney disease   . BPH (benign prostatic hyperplasia)   . Hypercholesteremia   . Myocardial infarction (HCC)   . CHF (congestive heart failure) (HCC)   . Ulcer of toe (HCC)   . Ulcer of heel and midfoot (HCC)   . Presence of permanent cardiac pacemaker    Past Surgical History:  Past Surgical History  Procedure Laterality Date  . Coronary artery bypass graft    . Carotid endarterectomy    . Pacemaker insertion      s/p PPM 2001 (R sided),  RV lead fracture 10/12 with new system (MDT) placed on Left side due to R sided venous occlusion  . Hernia repair    . Cataract extraction w/ intraocular lens implant Bilateral   . Insert / replace / remove pacemaker    . Wound debridement Left 07/28/2014    Procedure: DEBRIDEMENT WOUND/LEFT HEEL DEBRIDMENT;  Surgeon: Annice Needy, MD;  Location: [facility] ORS;  Service: Vascular;  Laterality: Left;  . Application of wound vac Left 07/28/2014    Procedure: APPLICATION OF WOUND VAC;  Surgeon: Annice Needy, MD;  Location: [facility] ORS;  Service: Vascular;  Laterality: Left;  . Amputation Right 12/21/2014    Procedure: AMPUTATION RAY/RIGHT;  Surgeon: Nadara Mustard, MD;  Location: [facility] OR;  Service: Orthopedics;  Laterality: Right;  . Amputation Right 02/10/2015   Procedure: Right Below Knee Amputation;  Surgeon: Nadara Mustard, MD;  Location: [facility] OR;  Service: Orthopedics;  Laterality: Right;   HPI:  82 year old male with a history of right BKA, CK D, stage III, hypertension, diabetes, who presented to the emergency bar with complaints of hypoglycemia. CXR No active disease. Chart states report facility stated pt has had poor intake and "hasn't been acting his normal." No prior St notes Pt. states he has difficulty swallowing (could not give details).   Assessment / Plan / Recommendation Clinical Impression  Suspicion for airway compromise evidenced by consistent delayed cough after thin and nectar consistency throughout evaluation. Honey thick consumed without overt s/s aspiration. Missing upper dentition resultingin inefficient mastication and delayed transit with solid. MBS recommended and xray unable to accomodate this afternoon. Recommend Dys 2 texture with honey thick liquids and MBS soon (next date?), pills whole in applesauce, no straws.           Aspiration Risk  Moderate aspiration risk    Diet Recommendation Dysphagia 2 (Fine chop);Honey-thick liquid   Liquid Administration via: Cup;No straw Medication Administration: Whole meds with puree Supervision: Patient able to self feed;Full supervision/cueing for compensatory strategies Compensations: Slow rate;Small sips/bites Postural Changes: Seated upright at 90 degrees    Other  Recommendations Oral Care Recommendations: Oral care BID Other  Recommendations: Order thickener from pharmacy   Follow up Recommendations   (TBD)    Frequency and Duration min 2x/week  2 weeks       Prognosis Prognosis for Safe Diet Advancement:  (to be determined after MBS)      Swallow Study   General HPI: 82 year old male with a history of right BKA, CK D, stage III, hypertension, diabetes, who presented to the emergency bar with complaints of hypoglycemia. CXR No active disease. Chart states report  facility stated pt has had poor intake and "hasn't been acting his normal." No prior St notes Pt. states he has difficulty swallowing (could not give details). Type of Study: Bedside Swallow Evaluation Previous Swallow Assessment:  (none) Diet Prior to this Study: Regular;Thin liquids Temperature Spikes Noted: No Respiratory Status: Nasal cannula History of Recent Intubation: No Behavior/Cognition: Alert;Cooperative;Pleasant mood Oral Cavity Assessment: Dry Oral Care Completed by SLP: No Oral Cavity - Dentition:  (lower dentition, no upper) Vision: Functional for self-feeding Self-Feeding Abilities: Able to feed self;Needs set up Patient Positioning: Upright in bed Baseline Vocal Quality: Normal Volitional Cough: Strong Volitional Swallow: Able to elicit    Oral/Motor/Sensory Function Overall Oral Motor/Sensory Function: Within functional limits   Ice Chips Ice chips: Not tested   Thin Liquid Thin Liquid: Impaired Presentation: Cup Pharyngeal  Phase Impairments: Multiple swallows;Cough - Delayed    Nectar Thick Nectar Thick Liquid: Impaired Presentation: Cup Pharyngeal Phase Impairments: Multiple swallows;Cough - Delayed   Honey Thick Honey Thick Liquid: Within functional limits Presentation: Cup   Puree Puree: Impaired Pharyngeal Phase Impairments: Multiple swallows;Cough - Delayed   Solid   GO   Solid: Impaired Oral Phase Impairments: Impaired mastication Oral Phase Functional Implications: Impaired mastication;Prolonged oral transit        Royce Macadamia 02/25/2015,1:43 PM  Breck Coons [place].[employer] [phone]

## 2015-02-26 ENCOUNTER — Inpatient Hospital Stay (HOSPITAL_COMMUNITY): Payer: Medicare Other

## 2015-02-26 LAB — BASIC METABOLIC PANEL
ANION GAP: 9 (ref 5–15)
BUN: 48 mg/dL — ABNORMAL HIGH (ref 6–20)
CHLORIDE: 104 mmol/L (ref 101–111)
CO2: 26 mmol/L (ref 22–32)
Calcium: 7.2 mg/dL — ABNORMAL LOW (ref 8.9–10.3)
Creatinine, Ser: 2.91 mg/dL — ABNORMAL HIGH (ref 0.61–1.24)
GFR calc Af Amer: 20 mL/min — ABNORMAL LOW (ref >60)
GFR calc non Af Amer: 17 mL/min — ABNORMAL LOW (ref >60)
GLUCOSE: 250 mg/dL — AB (ref 65–99)
POTASSIUM: 4.9 mmol/L (ref 3.5–5.1)
Sodium: 139 mmol/L (ref 135–145)

## 2015-02-26 LAB — GLUCOSE, CAPILLARY
GLUCOSE-CAPILLARY: 244 mg/dL — AB (ref 65–99)
Glucose-Capillary: 211 mg/dL — ABNORMAL HIGH (ref 65–99)
Glucose-Capillary: 195 mg/dL — ABNORMAL HIGH (ref 65–99)
Glucose-Capillary: 173 mg/dL — ABNORMAL HIGH (ref 65–99)
Glucose-Capillary: 183 mg/dL — ABNORMAL HIGH (ref 65–99)
Glucose-Capillary: 248 mg/dL — ABNORMAL HIGH (ref 65–99)
Glucose-Capillary: 233 mg/dL — ABNORMAL HIGH (ref 65–99)

## 2015-02-26 MED ORDER — MUPIROCIN 2 % EX OINT
TOPICAL_OINTMENT | Freq: Two times a day (BID) | CUTANEOUS | Status: DC
  Administered 2015-02-26 – 2015-02-28 (×5): via TOPICAL
  Filled 2015-02-26: qty 22

## 2015-02-26 NOTE — Progress Notes (Signed)
[facility] PROGRESS NOTE  [name] UJW:[id number] DOB: April 06, 1920 DOA: 02/24/2015 PCP: Lindwood Qua, MD  Assessment/Plan:  Principal Problem:     Dehydration, severe - Continue IVF rehydration  Acute renal failure superimposed on stage 3 chronic kidney disease (HCC) - Monitor serum creatinine most likely elevated due to prerenal etiology from dehydration  UTI - urine culture growing yeast - d/c rocephin -Patient is currently on Diflucan  Active Problems:   Diabetic peripheral neuropathy (HCC)   HLD (hyperlipidemia)    Essential hypertension - Blood pressures have fluctuated. Blood pressures persistently remain elevated we'll plan on continuing home medication regimen beta blocker.    Pacemaker-[employer]   DM (diabetes mellitus), type 2 with renal complications (HCC) - We'll continue to monitor serum glucose levels - Place on dysphagia 2 diabetic diet    CAD (coronary artery disease)   Diabetic ulcer of right foot associated with type 2 diabetes mellitus (HCC)   S/P BKA (below knee amputation) unilateral (HCC)  Penil ulcer - place topical antibiotic     Foley catheter in place on admission, has been discontinued. Will monitor as patient has been retaining. Have nursing do In and out catheter. Patient may require foley cath should he continue to retain urine.   Code Status: DNR Family Communication: d/c patient directly Disposition Plan: Pending improvement in condition   Consultants:  None  Procedures:  none  Antibiotics:  Rocephin>>2/4  Antifungal - diflucan  HPI/Subjective: Pt has no new complaints  Objective: Filed Vitals:   02/26/15 0451 02/26/15 0801  BP: 107/40 157/60  Pulse: 70 72  Temp: 98.5 F (36.9 C) 97.6 F (36.4 C)  Resp: 19 19    Intake/Output Summary (Last 24 hours) at 02/26/15 1501 Last data filed at 02/26/15 1309  Gross per 24 hour  Intake 1243.33 ml  Output   1625 ml  Net -381.67 ml   Filed Weights   02/24/15 1615 02/24/15 2028 02/26/15 0500  Weight: 90.7 kg (199 lb 15.3 oz) 78.835 kg (173 lb 12.8 oz) 78.835 kg (173 lb 12.8 oz)    Exam:   General:  Pt in nad, alert and awake  Cardiovascular: rrr, no mrg  Respiratory: cta bl, no wheezes  Abdomen: soft, nt, nd  Musculoskeletal: no cyanosis on limited exam  Data Reviewed: Basic Metabolic Panel:  Recent Labs Lab 02/24/15 1420 02/24/15 1653 02/25/15 0528  NA 145 143 141  K 5.4* 5.4* 5.3*  CL 105 104 105  CO2 27 26 26   GLUCOSE 46* 155* 172*  BUN 77* 75* 67*  CREATININE 4.53* 4.41* 3.94*  CALCIUM 7.1* 7.0* 6.9*   Liver Function Tests:  Recent Labs Lab 02/24/15 1420  AST 26  ALT 14*  ALKPHOS 80  BILITOT 0.1*  PROT 6.1*  ALBUMIN 2.0*   No results for input(s): LIPASE, AMYLASE in the last 168 hours. No results for input(s): AMMONIA in the last 168 hours. CBC:  Recent Labs Lab 02/24/15 1420 02/24/15 1653 02/25/15 0528  WBC 9.3 8.6 8.1  NEUTROABS 7.2  --   --   HGB 11.3* 10.7* 10.1*  HCT 36.8* 34.9* 33.3*  MCV 88.2 87.3 88.6  PLT 339 333 310   Cardiac Enzymes:  Recent Labs Lab 02/24/15 1639  CKTOTAL 63   BNP (last 3 results) No results for input(s): BNP in the last 8760 hours.  ProBNP (last 3 results) No results for input(s): PROBNP in the last 8760 hours.  CBG:  Recent Labs Lab 02/26/15 0114 02/26/15 0228 02/26/15 0414 02/26/15  0803 02/26/15 1143  GLUCAP 244* 211* 195* 173* 183*    Recent Results (from the past 240 hour(s))  Blood Culture (routine x 2)     Status: None (Preliminary result)   Collection Time: 02/24/15  1:24 PM  Result Value Ref Range Status   Specimen Description BLOOD LEFT HAND  Final   Special Requests BOTTLES DRAWN AEROBIC ONLY 5CC  Final   Culture NO GROWTH 1 DAY  Final   Report Status PENDING  Incomplete  Urine culture     Status: None   Collection Time: 02/24/15  1:50 PM  Result Value Ref Range Status   Specimen Description URINE, CATHETERIZED  Final    Special Requests NONE  Final   Culture >=100,000 COLONIES/mL YEAST  Final   Report Status 02/25/2015 FINAL  Final  Blood Culture (routine x 2)     Status: None (Preliminary result)   Collection Time: 02/24/15  2:23 PM  Result Value Ref Range Status   Specimen Description BLOOD RIGHT HAND  Final   Special Requests IN PEDIATRIC BOTTLE 3CC  Final   Culture NO GROWTH < 24 HOURS  Final   Report Status PENDING  Incomplete  MRSA PCR Screening     Status: None   Collection Time: 02/24/15  6:06 PM  Result Value Ref Range Status   MRSA by PCR NEGATIVE NEGATIVE Final    Comment:        The GeneXpert MRSA Assay (FDA approved for NASAL specimens only), is one component of a comprehensive MRSA colonization surveillance program. It is not intended to diagnose MRSA infection nor to guide or monitor treatment for MRSA infections.      Studies: Dg Swallowing Func-speech Pathology  02/26/2015  Objective Swallowing Evaluation: Type of Study: MBS-Modified Barium Swallow Study Patient Details Name: [name] MRN: [id number] Date of Birth: 1920/07/29 Today's Date: 02/26/2015 Time: SLP Start Time (ACUTE ONLY): 0935-SLP Stop Time (ACUTE ONLY): 0958 SLP Time Calculation (min) (ACUTE ONLY): 23 min Past Medical History: Past Medical History Diagnosis Date . Hypertension  . Hyperlipidemia  . Diabetes mellitus  . Coronary artery disease    s/p CABG 1996 . Sick sinus syndrome (HCC)    s/p PPM 2001 (R sided),  RV lead fracture 10/12 with new system (MDT) placed on L side due to R sided venous occlusion . Diabetic neuropathy (HCC)  . CVD (cardiovascular disease)    s/p CEA . Peripheral vascular disease (HCC)  . Chronic kidney disease  . BPH (benign prostatic hyperplasia)  . Hypercholesteremia  . Myocardial infarction (HCC)  . CHF (congestive heart failure) (HCC)  . Ulcer of toe (HCC)  . Ulcer of heel and midfoot (HCC)  . Presence of permanent cardiac pacemaker  Past Surgical History: Past Surgical History Procedure  Laterality Date . Coronary artery bypass graft   . Carotid endarterectomy   . Pacemaker insertion     s/p PPM 2001 (R sided),  RV lead fracture 10/12 with new system (MDT) placed on Left side due to R sided venous occlusion . Hernia repair   . Cataract extraction w/ intraocular lens implant Bilateral  . Insert / replace / remove pacemaker   . Wound debridement Left 07/28/2014   Procedure: DEBRIDEMENT WOUND/LEFT HEEL DEBRIDMENT;  Surgeon: Annice Needy, MD;  Location: [facility] ORS;  Service: Vascular;  Laterality: Left; . Application of wound vac Left 07/28/2014   Procedure: APPLICATION OF WOUND VAC;  Surgeon: Annice Needy, MD;  Location: [facility] ORS;  Service: Vascular;  Laterality: Left; . Amputation Right 12/21/2014   Procedure: AMPUTATION RAY/RIGHT;  Surgeon: Nadara Mustard, MD;  Location: [facility] OR;  Service: Orthopedics;  Laterality: Right; . Amputation Right 02/10/2015   Procedure: Right Below Knee Amputation;  Surgeon: Nadara Mustard, MD;  Location: [facility] OR;  Service: Orthopedics;  Laterality: Right; HPI: 82 year old male with a history of right BKA, CK D, stage III, hypertension, diabetes, who presented to the emergency dept with complaints of hypoglycemia. CXR No active disease. Chart states report facility stated pt has had poor intake and "hasn't been acting his normal." No prior St notes Pt. states he has difficulty swallowing (could not give details). Subjective: The patient was seen in radiology for MBSS.  He was in distress due to inability to urinate.  Nurse was made aware of the trouble and planned to scan bladder when the patient returned to his room.  Assessment / Plan / Recommendation CHL IP CLINICAL IMPRESSIONS 02/26/2015 Therapy Diagnosis Mild oral phase dysphagia;Mild pharyngeal phase dysphagia Clinical Impression MBSS was completed.  The patient presented with mild oropharyngeal dysphagia characterized by mildly delayed oral transit and premature spill given dual textured solids and mildly delayed swallow trigger.   Material was in the vallecular prior to swallow initiation.  No penetration/aspiration was observed.  Of note, the patient had an intermittent cough during the MBSS that did not appear to be related to penetration/aspiration.  Esophgeal sweep revealed it mildly slow to clear.  ST will follow up for therapeutic diet tolerance.   Impact on safety and function Mild aspiration risk   CHL IP TREATMENT RECOMMENDATION 02/26/2015 Treatment Recommendations Therapy as outlined in treatment plan below   Prognosis 02/26/2015 Prognosis for Safe Diet Advancement Fair Barriers to Reach Goals -- Barriers/Prognosis Comment -- CHL IP DIET RECOMMENDATION 02/26/2015 SLP Diet Recommendations Dysphagia 3 (Mech soft) solids;Thin liquid Liquid Administration via Cup;Straw Medication Administration Whole meds with puree Compensations Slow rate;Small sips/bites;Follow solids with liquid Postural Changes Seated upright at 90 degrees   CHL IP OTHER RECOMMENDATIONS 02/26/2015 Recommended Consults -- Oral Care Recommendations Oral care BID Other Recommendations --   CHL IP FOLLOW UP RECOMMENDATIONS 02/25/2015 Follow up Recommendations (No Data)   CHL IP FREQUENCY AND DURATION 02/26/2015 Speech Therapy Frequency (ACUTE ONLY) min 2x/week Treatment Duration 2 weeks      CHL IP ORAL PHASE 02/26/2015 Oral Phase Impaired Oral - Pudding Teaspoon -- Oral - Pudding Cup -- Oral - Honey Teaspoon -- Oral - Honey Cup -- Oral - Nectar Teaspoon -- Oral - Nectar Cup -- Oral - Nectar Straw -- Oral - Thin Teaspoon -- Oral - Thin Cup -- Oral - Thin Straw -- Oral - Puree -- Oral - Mech Soft -- Oral - Regular -- Oral - Multi-Consistency Impaired mastication;Premature spillage Oral - Pill -- Oral Phase - Comment --  CHL IP PHARYNGEAL PHASE 02/26/2015 Pharyngeal Phase Impaired Pharyngeal- Pudding Teaspoon -- Pharyngeal -- Pharyngeal- Pudding Cup -- Pharyngeal -- Pharyngeal- Honey Teaspoon -- Pharyngeal -- Pharyngeal- Honey Cup -- Pharyngeal -- Pharyngeal- Nectar Teaspoon --  Pharyngeal -- Pharyngeal- Nectar Cup -- Pharyngeal -- Pharyngeal- Nectar Straw -- Pharyngeal -- Pharyngeal- Thin Teaspoon Delayed swallow initiation-vallecula Pharyngeal -- Pharyngeal- Thin Cup Delayed swallow initiation-vallecula Pharyngeal -- Pharyngeal- Thin Straw Delayed swallow initiation-vallecula Pharyngeal -- Pharyngeal- Puree -- Pharyngeal -- Pharyngeal- Mechanical Soft -- Pharyngeal -- Pharyngeal- Regular -- Pharyngeal -- Pharyngeal- Multi-consistency -- Pharyngeal -- Pharyngeal- Pill -- Pharyngeal -- Pharyngeal Comment --  CHL IP CERVICAL ESOPHAGEAL PHASE 02/26/2015 Cervical Esophageal Phase WFL Pudding Teaspoon --  Pudding Cup -- Honey Teaspoon -- Honey Cup -- Nectar Teaspoon -- Nectar Cup -- Nectar Straw -- Thin Teaspoon -- Thin Cup -- Thin Straw -- Puree -- Mechanical Soft -- Regular -- Multi-consistency -- Pill -- Cervical Esophageal Comment -- CHL IP GO 02/26/2015 Functional Assessment Tool Used ASHA NOMS and clinical judgment.   Functional Limitations Swallowing Swallow Current Status [id number]) CJ Swallow Goal Status (U0454) CJ Swallow Discharge Status [phone]) (None) Motor Speech Current Status [phone]) (None) Motor Speech Goal Status [phone]) (None) Motor Speech Goal Status [phone]) (None) Spoken Language Comprehension Current Status [phone]) (None) Spoken Language Comprehension Goal Status (N6295) (None) Spoken Language Comprehension Discharge Status [phone]) (None) Spoken Language Expression Current Status [phone]) (None) Spoken Language Expression Goal Status [phone]) (None) Spoken Language Expression Discharge Status [phone]) (None) Attention Current Status (I3474) (None) Attention Goal Status (Q5956) (None) Attention Discharge Status [phone]) (None) Memory Current Status (E3329) (None) Memory Goal Status (J1884) (None) Memory Discharge Status (Z6606) (None) Voice Current Status (T0160) (None) Voice Goal Status (F0932) (None) Voice Discharge Status [phone]) (None) Other Speech-Language Pathology Functional  Limitation [phone]) (None) Other Speech-Language Pathology Functional Limitation Goal Status (K2706) (None) Other Speech-Language Pathology Functional Limitation Discharge Status [phone]) (None) Dimas Aguas, MA, CCC-SLP Acute Rehab SLP [phone] Fleet Contras 02/26/2015, 10:16 AM               Scheduled Meds: . collagenase  1 application Topical Daily  . enoxaparin (LOVENOX) injection  30 mg Subcutaneous Q24H  . fluconazole  100 mg Oral Daily  . multivitamin with minerals  1 tablet Oral Daily  . mupirocin ointment   Topical BID  . sodium chloride flush  3 mL Intravenous Q12H   Continuous Infusions: . dextrose 5 % and 0.45% NaCl 50 mL/hr at 02/26/15 1339    Time spent: > 35  Brylee Mcgreal, [facility]  [facility] Pager [phone]. If 7PM-7AM, please contact night-coverage at [email or web address], password [facility] 02/26/2015, 3:01 PM  LOS: 2 days

## 2015-02-26 NOTE — Consult Note (Addendum)
WOC wound consult note Reason for Consult:Patient known to our department, seen by my partner during last admission. Wound type:Pressure injuries to left heel and bilateral buttocks, medical device related pressure injury (MDRPI) noted at tip of penis in uncircumcised male. Pressure Ulcer POA: Yes Measurement: Left heel, Stage 3:  3cm x 7cm with 75% pink base and 25% necrotic yellow adherent slough.  Exudate consistent with autolytically and enzymatically debriding necrotic tissue.  Right buttock, Stage 3, 4cm x 5cm x 0.2cm with a pale pink base and no exudate in a larger area of blanching erythema.  Left buttock with Stage 3 open pressure injury measuring 2cm x 1cm x 0.4cm with serosanguinous exudate in a moderate amount today.  Pink, base.  The tip of the penis is a medical device related pressure injury (MDRPI) most likely resulting from an indwelling urinary catheter.  Currently, the MD has ordered twice daily mupirocin ointment and I am in agreement with that.  If urine output management is indicated, Urology may need to be consulted as either a suprapubic catheter may be indicated or intermittent, in-and-out catheterization considered as strategies. This ulcer measures 1cm round and the depth is not able to be determined as the wound bed is covered with yellow necrotic slough. Wound bed:As described above. Drainage (amount, consistency, odor) As described above Periwound:As described above. Dressing procedure/placement/frequency: I have provided Nursing with orders to care for the buttocks (turning and repositioning plus use of a sacral foam dressing), and the left heel (Santyl, topped with a saline dressing, followed by a dry dressing and coverered with silicone foam.  Foot is off-loaded by a multipodus boot.  I have ordered a pressure redistribution boot for OOB use. I did not inspect or otherwise assess the BKA amputation site today. WOC nursing team will not follow, but will remain available to  this patient, the nursing and medical teams.  Please re-consult if needed. Thanks, Ladona Mow, MSN, RN, GNP, [place]  Pager# [phone]

## 2015-02-26 NOTE — Progress Notes (Signed)
Speech Language Pathology Treatment:    Patient Details Name: [name] MRN: [id number] DOB: [birth year] Today's Date: 02/26/2015 Time:  -    Please see imaging for complete results of MBS and all recommendations.  A dysphagia 3 diet with thin liquids was recommended.    Dimas Aguas, MA, CCC-SLP Acute Rehab SLP [phone] Fleet Contras 02/26/2015, 4:25 PM

## 2015-02-27 LAB — BASIC METABOLIC PANEL
ANION GAP: 8 (ref 5–15)
Anion gap: 9 (ref 5–15)
BUN: 42 mg/dL — AB (ref 6–20)
BUN: 38 mg/dL — AB (ref 6–20)
CALCIUM: 7.1 mg/dL — AB (ref 8.9–10.3)
CALCIUM: 7.1 mg/dL — AB (ref 8.9–10.3)
CO2: 27 mmol/L (ref 22–32)
CO2: 26 mmol/L (ref 22–32)
CREATININE: 2.71 mg/dL — AB (ref 0.61–1.24)
Chloride: 103 mmol/L (ref 101–111)
Chloride: 104 mmol/L (ref 101–111)
Creatinine, Ser: 2.34 mg/dL — ABNORMAL HIGH (ref 0.61–1.24)
GFR calc Af Amer: 22 mL/min — ABNORMAL LOW (ref >60)
GFR calc Af Amer: 26 mL/min — ABNORMAL LOW (ref >60)
GFR, EST NON AFRICAN AMERICAN: 19 mL/min — AB (ref >60)
GFR, EST NON AFRICAN AMERICAN: 23 mL/min — AB (ref >60)
GLUCOSE: 216 mg/dL — AB (ref 65–99)
GLUCOSE: 159 mg/dL — AB (ref 65–99)
Potassium: 4.5 mmol/L (ref 3.5–5.1)
Potassium: 4.9 mmol/L (ref 3.5–5.1)
Sodium: 138 mmol/L (ref 135–145)
Sodium: 139 mmol/L (ref 135–145)

## 2015-02-27 LAB — GLUCOSE, CAPILLARY
GLUCOSE-CAPILLARY: 150 mg/dL — AB (ref 65–99)
GLUCOSE-CAPILLARY: 192 mg/dL — AB (ref 65–99)
GLUCOSE-CAPILLARY: 189 mg/dL — AB (ref 65–99)
GLUCOSE-CAPILLARY: 163 mg/dL — AB (ref 65–99)
Glucose-Capillary: 148 mg/dL — ABNORMAL HIGH (ref 65–99)
Glucose-Capillary: 190 mg/dL — ABNORMAL HIGH (ref 65–99)

## 2015-02-27 LAB — CBC
HEMATOCRIT: 31.8 % — AB (ref 39.0–52.0)
Hemoglobin: 9.9 g/dL — ABNORMAL LOW (ref 13.0–17.0)
MCH: 27.5 pg (ref 26.0–34.0)
MCHC: 31.1 g/dL (ref 30.0–36.0)
MCV: 88.3 fL (ref 78.0–100.0)
PLATELETS: 286 10*3/uL (ref 150–400)
RBC: 3.6 MIL/uL — ABNORMAL LOW (ref 4.22–5.81)
RDW: 15.3 % (ref 11.5–15.5)
WBC: 8.3 10*3/uL (ref 4.0–10.5)

## 2015-02-27 MED ORDER — TAMSULOSIN HCL 0.4 MG PO CAPS
0.4000 mg | ORAL_CAPSULE | Freq: Every day | ORAL | Status: DC
  Administered 2015-02-27 – 2015-02-28 (×2): 0.4 mg via ORAL
  Filled 2015-02-27 (×2): qty 1

## 2015-02-27 NOTE — Progress Notes (Addendum)
Foley placed per MD order.  775 mL dark yellow urine return.  MD notified. Patient tolerated well.

## 2015-02-27 NOTE — Care Management Important Message (Signed)
Important Message  Patient Details  Name: [name] MRN: [id number] Date of Birth: 1920-05-20   Medicare Important Message Given:  Yes    [name] 02/27/2015, 11:18 AMImportant Message  Patient Details  Name: [name] MRN: [id number] Date of Birth: [birth year]   Medicare Important Message Given:  Yes    [name] 02/27/2015, 11:18 AM

## 2015-02-27 NOTE — [place] FL2 (Signed)
[place] MEDICAID FL2 LEVEL OF CARE SCREENING TOOL     IDENTIFICATION  Patient Name: [name] Birthdate: [birth year] Sex: male Admission Date (Current Location): 02/24/2015  [facility] [place] Number:  Producer, television/film/video and Address:  [facility] [place], [address] [place], [place] [address]      Provider Number: [id number]  Attending Physician Name and Address:  Penny Pia, MD  Relative Name and Phone Number:       Current Level of Care: Hospital Recommended Level of Care: Skilled Nursing Facility Prior Approval Number:    Date Approved/Denied:   PASRR Number: [id number]  Discharge Plan: SNF    Current Diagnoses: Patient Active Problem List   Diagnosis Date Noted  . Acute renal failure superimposed on stage 3 chronic kidney disease (HCC) 02/24/2015  . Dehydration, severe 02/24/2015  . Foley catheter in place on admission 02/24/2015  . Abnormal urinalysis 02/24/2015  . Acute renal failure (ARF) (HCC) 02/24/2015  . Diabetic hypoglycemia (HCC) 02/24/2015  . Pressure ulcer 02/13/2015  . S/P BKA (below knee amputation) unilateral (HCC) 02/10/2015  . DM (diabetes mellitus), type 2 with renal complications (HCC) 12/20/2014  . CAD (coronary artery disease) 12/20/2014  . CKD (chronic kidney disease) stage 3, GFR 30-59 ml/min 12/20/2014  . Acute osteomyelitis of toe of right foot (HCC) 12/20/2014  . Diabetic ulcer of right foot associated with type 2 diabetes mellitus (HCC)   . Complete heart block (HCC) 12/10/2012  . Pacemaker-Medtronic 11/20/2011  . DIZZINESS 11/01/2008  . EDEMA 11/01/2008  . Diabetic peripheral neuropathy (HCC) 04/15/2008  . HLD (hyperlipidemia) 04/15/2008  . Essential hypertension 04/15/2008  . Sick sinus syndrome (HCC) 04/15/2008    Orientation RESPIRATION BLADDER Height & Weight     Self    Continent (Foley present) Weight: 172 lb 13.5 oz (78.4 kg) Height:     BEHAVIORAL SYMPTOMS/MOOD NEUROLOGICAL BOWEL  NUTRITION STATUS      Continent Diet (please see dc summary for dietary needs at time of discharge)  AMBULATORY STATUS COMMUNICATION OF NEEDS Skin   Extensive Assist Verbally PU Stage and Appropriate Care ("Unstageable")       PU Stage 4 Dressing:  (please see dc summary for wound care needs at time of discharge)               Personal Care Assistance Level of Assistance  Bathing, Feeding, Dressing Bathing Assistance: Maximum assistance Feeding assistance: Limited assistance Dressing Assistance: Maximum assistance     Functional Limitations Info             SPECIAL CARE FACTORS FREQUENCY  PT (By licensed PT), OT (By licensed OT)                    Contractures Contractures Info: Not present    Additional Factors Info  Code Status, Allergies Code Status Info: DNR Allergies Info: Ace Inhibitors, ciprofloxacin            Current Medications (02/27/2015):  This is the current hospital active medication list Current Facility-Administered Medications  Medication Dose Route Frequency Provider Last Rate Last Dose  . acetaminophen (TYLENOL) tablet 650 mg  650 mg Oral Q6H PRN Russella Dar, NP       Or  . acetaminophen (TYLENOL) suppository 650 mg  650 mg Rectal Q6H PRN Russella Dar, NP      . collagenase (SANTYL) ointment 1 application  1 application Topical Daily Russella Dar, NP   1 application  at 02/27/15 1011  . dextrose 5 %-0.45 % sodium chloride infusion   Intravenous Continuous Penny Pia, MD 50 mL/hr at 02/27/15 0230    . enoxaparin (LOVENOX) injection 30 mg  30 mg Subcutaneous Q24H Russella Dar, NP   30 mg at 02/26/15 2117  . fluconazole (DIFLUCAN) tablet 100 mg  100 mg Oral Daily Penny Pia, MD   100 mg at 02/27/15 1021  . morphine 2 MG/ML injection 0.5 mg  0.5 mg Intravenous Q2H PRN Russella Dar, NP   0.5 mg at 02/27/15 0751  . multivitamin with minerals tablet 1 tablet  1 tablet Oral Daily Penny Pia, MD   1 tablet at 02/27/15 1021  .  mupirocin ointment (BACTROBAN) 2 %   Topical BID Penny Pia, MD      . RESOURCE THICKENUP CLEAR   Oral PRN Penny Pia, MD      . sodium chloride flush (NS) 0.9 % injection 3 mL  3 mL Intravenous Q12H Russella Dar, NP   3 mL at 02/27/15 1021     Discharge Medications: Please see discharge summary for a list of discharge medications.  Relevant Imaging Results:  Relevant Lab Results:   Additional Information SSN: [id number]  Rondel Baton, LCSW

## 2015-02-27 NOTE — Progress Notes (Signed)
[facility] PROGRESS NOTE  [name]:[id number] DOB: 80-Apr-1922 DOA: 02/24/2015 PCP: Lindwood Qua, MD  Assessment/Plan:  Principal Problem:     Dehydration, severe - resolving with improvement in oral intake and gentle fluid hydration  Acute renal failure superimposed on stage 3 chronic kidney disease (HCC) - Monitor serum creatinine most likely elevated due to prerenal etiology from dehydration  UTI - urine culture growing yeast - d/c rocephin -Patient improving on Diflucan  Active Problems:   Diabetic peripheral neuropathy (HCC)   HLD (hyperlipidemia)    Essential hypertension - Blood pressures have fluctuated. Blood pressures persistently remain elevated we'll plan on continuing home medication regimen beta blocker.    Pacemaker-Medtronic   DM (diabetes mellitus), type 2 with renal complications (HCC) - We'll continue to monitor serum glucose levels - Place on dysphagia 2 diabetic diet    CAD (coronary artery disease)   Diabetic ulcer of right foot associated with type 2 diabetes mellitus (HCC)   S/P BKA (below knee amputation) unilateral (HCC)  Penil ulcer - place topical antibiotic     Foley catheter in place on admission, has been discontinued. Will monitor as patient has been retaining. Have nursing do In and out catheter. Patient may require foley cath should he continue to retain urine.   Code Status: DNR Family Communication: d/c patient directly Disposition Plan: Pending improvement in condition   Consultants:  None  Procedures:  none  Antibiotics:  Rocephin>>2/4  Antifungal - diflucan  HPI/Subjective: Pt has no new complaints. No acute issues overnight.  Objective: Filed Vitals:   02/27/15 0411 02/27/15 0840  BP: 154/58 134/58  Pulse: 69 73  Temp: 98.6 F (37 C) 97.4 F (36.3 C)  Resp: 20 20    Intake/Output Summary (Last 24 hours) at 02/27/15 1621 Last data filed at 02/27/15 1408  Gross per 24 hour  Intake     680 ml  Output   1076 ml  Net   -396 ml   Filed Weights   02/26/15 0500 02/26/15 2123 02/27/15 0346  Weight: 78.835 kg (173 lb 12.8 oz) 78.4 kg (172 lb 13.5 oz) 78.4 kg (172 lb 13.5 oz)    Exam:   General:  Pt in nad, alert and awake  Cardiovascular: rrr, no mrg  Respiratory: cta bl, no wheezes  Abdomen: soft, nt, nd  Musculoskeletal: no cyanosis on limited exam  Data Reviewed: Basic Metabolic Panel:  Recent Labs Lab 02/24/15 1653 02/25/15 0528 02/26/15 1415 02/26/15 2332 02/27/15 0617  NA 143 141 139 139 138  K 5.4* 5.3* 4.9 4.9 4.5  CL 104 105 104 103 104  CO2 26 26 26 27 26   GLUCOSE 155* 172* 250* 216* 159*  BUN 75* 67* 48* 42* 38*  CREATININE 4.41* 3.94* 2.91* 2.71* 2.34*  CALCIUM 7.0* 6.9* 7.2* 7.1* 7.1*   Liver Function Tests:  Recent Labs Lab 02/24/15 1420  AST 26  ALT 14*  ALKPHOS 80  BILITOT 0.1*  PROT 6.1*  ALBUMIN 2.0*   No results for input(s): LIPASE, AMYLASE in the last 168 hours. No results for input(s): AMMONIA in the last 168 hours. CBC:  Recent Labs Lab 02/24/15 1420 02/24/15 1653 02/25/15 0528 02/26/15 2332  WBC 9.3 8.6 8.1 8.3  NEUTROABS 7.2  --   --   --   HGB 11.3* 10.7* 10.1* 9.9*  HCT 36.8* 34.9* 33.3* 31.8*  MCV 88.2 87.3 88.6 88.3  PLT 339 333 310 286   Cardiac Enzymes:  Recent Labs Lab 02/24/15 1639  CKTOTAL 63   BNP (last 3 results) No results for input(s): BNP in the last 8760 hours.  ProBNP (last 3 results) No results for input(s): PROBNP in the last 8760 hours.  CBG:  Recent Labs Lab 02/26/15 2122 02/27/15 0045 02/27/15 0409 02/27/15 0720 02/27/15 1110  GLUCAP 233* 190* 148* 150* 192*    Recent Results (from the past 240 hour(s))  Blood Culture (routine x 2)     Status: None (Preliminary result)   Collection Time: 02/24/15  1:24 PM  Result Value Ref Range Status   Specimen Description BLOOD LEFT HAND  Final   Special Requests BOTTLES DRAWN AEROBIC ONLY 5CC  Final   Culture NO GROWTH 3  DAYS  Final   Report Status PENDING  Incomplete  Urine culture     Status: None   Collection Time: 02/24/15  1:50 PM  Result Value Ref Range Status   Specimen Description URINE, CATHETERIZED  Final   Special Requests NONE  Final   Culture >=100,000 COLONIES/mL YEAST  Final   Report Status 02/25/2015 FINAL  Final  Blood Culture (routine x 2)     Status: None (Preliminary result)   Collection Time: 02/24/15  2:23 PM  Result Value Ref Range Status   Specimen Description BLOOD RIGHT HAND  Final   Special Requests IN PEDIATRIC BOTTLE 3CC  Final   Culture NO GROWTH 3 DAYS  Final   Report Status PENDING  Incomplete  MRSA PCR Screening     Status: None   Collection Time: 02/24/15  6:06 PM  Result Value Ref Range Status   MRSA by PCR NEGATIVE NEGATIVE Final    Comment:        The GeneXpert MRSA Assay (FDA approved for NASAL specimens only), is one component of a comprehensive MRSA colonization surveillance program. It is not intended to diagnose MRSA infection nor to guide or monitor treatment for MRSA infections.      Studies: Dg Swallowing Func-speech Pathology  02/26/2015  Objective Swallowing Evaluation: Type of Study: MBS-Modified Barium Swallow Study Patient Details Name: [name] MRN: [id number] Date of Birth: 80/03/1920 Today's Date: 02/26/2015 Time: SLP Start Time (ACUTE ONLY): 0935-SLP Stop Time (ACUTE ONLY): 0958 SLP Time Calculation (min) (ACUTE ONLY): 23 min Past Medical History: Past Medical History Diagnosis Date . Hypertension  . Hyperlipidemia  . Diabetes mellitus  . Coronary artery disease    s/p CABG 1996 . Sick sinus syndrome (HCC)    s/p PPM 80 (R sided),  RV lead fracture 10/80 with new system (MDT) placed on L side due to R sided venous occlusion . Diabetic neuropathy (HCC)  . CVD (cardiovascular disease)    s/p CEA . Peripheral vascular disease (HCC)  . Chronic kidney disease  . BPH (benign prostatic hyperplasia)  . Hypercholesteremia  . Myocardial infarction  (HCC)  . CHF (congestive heart failure) (HCC)  . Ulcer of toe (HCC)  . Ulcer of heel and midfoot (HCC)  . Presence of permanent cardiac pacemaker  Past Surgical History: Past Surgical History Procedure Laterality Date . Coronary artery bypass graft   . Carotid endarterectomy   . Pacemaker insertion     s/p PPM 80 (R sided),  RV lead fracture 10/80 with new system (MDT) placed on Left side due to R sided venous occlusion . Hernia repair   . Cataract extraction w/ intraocular lens implant Bilateral  . Insert / replace / remove pacemaker   . Wound debridement Left 07/28/2014   Procedure: DEBRIDEMENT WOUND/LEFT HEEL  DEBRIDMENT;  Surgeon: Annice Needy, MD;  Location: [facility] ORS;  Service: Vascular;  Laterality: Left; . Application of wound vac Left 07/28/2014   Procedure: APPLICATION OF WOUND VAC;  Surgeon: Annice Needy, MD;  Location: [facility] ORS;  Service: Vascular;  Laterality: Left; . Amputation Right 12/21/2014   Procedure: AMPUTATION RAY/RIGHT;  Surgeon: Nadara Mustard, MD;  Location: [facility] OR;  Service: Orthopedics;  Laterality: Right; . Amputation Right 02/10/2015   Procedure: Right Below Knee Amputation;  Surgeon: Nadara Mustard, MD;  Location: [facility] OR;  Service: Orthopedics;  Laterality: Right; HPI: 82 year old male with a history of right BKA, CK D, stage III, hypertension, diabetes, who presented to the emergency dept with complaints of hypoglycemia. CXR No active disease. Chart states report facility stated pt has had poor intake and "hasn't been acting his normal." No prior St notes Pt. states he has difficulty swallowing (could not give details). Subjective: The patient was seen in radiology for MBSS.  He was in distress due to inability to urinate.  Nurse was made aware of the trouble and planned to scan bladder when the patient returned to his room.  Assessment / Plan / Recommendation CHL IP CLINICAL IMPRESSIONS 02/26/2015 Therapy Diagnosis Mild oral phase dysphagia;Mild pharyngeal phase dysphagia Clinical Impression MBSS  was completed.  The patient presented with mild oropharyngeal dysphagia characterized by mildly delayed oral transit and premature spill given dual textured solids and mildly delayed swallow trigger.  Material was in the vallecular prior to swallow initiation.  No penetration/aspiration was observed.  Of note, the patient had an intermittent cough during the MBSS that did not appear to be related to penetration/aspiration.  Esophgeal sweep revealed it mildly slow to clear.  ST will follow up for therapeutic diet tolerance.   Impact on safety and function Mild aspiration risk   CHL IP TREATMENT RECOMMENDATION 02/26/2015 Treatment Recommendations Therapy as outlined in treatment plan below   Prognosis 02/26/2015 Prognosis for Safe Diet Advancement Fair Barriers to Reach Goals -- Barriers/Prognosis Comment -- CHL IP DIET RECOMMENDATION 02/26/2015 SLP Diet Recommendations Dysphagia 3 (Mech soft) solids;Thin liquid Liquid Administration via Cup;Straw Medication Administration Whole meds with puree Compensations Slow rate;Small sips/bites;Follow solids with liquid Postural Changes Seated upright at 90 degrees   CHL IP OTHER RECOMMENDATIONS 02/26/2015 Recommended Consults -- Oral Care Recommendations Oral care BID Other Recommendations --   CHL IP FOLLOW UP RECOMMENDATIONS 02/25/2015 Follow up Recommendations (No Data)   CHL IP FREQUENCY AND DURATION 02/26/2015 Speech Therapy Frequency (ACUTE ONLY) min 2x/week Treatment Duration 2 weeks      CHL IP ORAL PHASE 02/26/2015 Oral Phase Impaired Oral - Pudding Teaspoon -- Oral - Pudding Cup -- Oral - Honey Teaspoon -- Oral - Honey Cup -- Oral - Nectar Teaspoon -- Oral - Nectar Cup -- Oral - Nectar Straw -- Oral - Thin Teaspoon -- Oral - Thin Cup -- Oral - Thin Straw -- Oral - Puree -- Oral - Mech Soft -- Oral - Regular -- Oral - Multi-Consistency Impaired mastication;Premature spillage Oral - Pill -- Oral Phase - Comment --  CHL IP PHARYNGEAL PHASE 02/26/2015 Pharyngeal Phase Impaired  Pharyngeal- Pudding Teaspoon -- Pharyngeal -- Pharyngeal- Pudding Cup -- Pharyngeal -- Pharyngeal- Honey Teaspoon -- Pharyngeal -- Pharyngeal- Honey Cup -- Pharyngeal -- Pharyngeal- Nectar Teaspoon -- Pharyngeal -- Pharyngeal- Nectar Cup -- Pharyngeal -- Pharyngeal- Nectar Straw -- Pharyngeal -- Pharyngeal- Thin Teaspoon Delayed swallow initiation-vallecula Pharyngeal -- Pharyngeal- Thin Cup Delayed swallow initiation-vallecula Pharyngeal -- Pharyngeal- Thin Straw Delayed swallow  initiation-vallecula Pharyngeal -- Pharyngeal- Puree -- Pharyngeal -- Pharyngeal- Mechanical Soft -- Pharyngeal -- Pharyngeal- Regular -- Pharyngeal -- Pharyngeal- Multi-consistency -- Pharyngeal -- Pharyngeal- Pill -- Pharyngeal -- Pharyngeal Comment --  CHL IP CERVICAL ESOPHAGEAL PHASE 02/26/2015 Cervical Esophageal Phase WFL Pudding Teaspoon -- Pudding Cup -- Honey Teaspoon -- Honey Cup -- Nectar Teaspoon -- Nectar Cup -- Nectar Straw -- Thin Teaspoon -- Thin Cup -- Thin Straw -- Puree -- Mechanical Soft -- Regular -- Multi-consistency -- Pill -- Cervical Esophageal Comment -- CHL IP GO 02/26/2015 Functional Assessment Tool Used ASHA NOMS and clinical judgment.   Functional Limitations Swallowing Swallow Current Status [phone]) CJ Swallow Goal Status (B2841) CJ Swallow Discharge Status [phone]) (None) Motor Speech Current Status [phone]) (None) Motor Speech Goal Status [phone]) (None) Motor Speech Goal Status [phone]) (None) Spoken Language Comprehension Current Status [phone]) (None) Spoken Language Comprehension Goal Status (G3875) (None) Spoken Language Comprehension Discharge Status [phone]) (None) Spoken Language Expression Current Status [phone]) (None) Spoken Language Expression Goal Status [phone]) (None) Spoken Language Expression Discharge Status [phone]) (None) Attention Current Status (W1093) (None) Attention Goal Status (A3557) (None) Attention Discharge Status [phone]) (None) Memory Current Status (R4270) (None) Memory Goal Status (W2376)  (None) Memory Discharge Status (E8315) (None) Voice Current Status (V7616) (None) Voice Goal Status (W7371) (None) Voice Discharge Status [phone]) (None) Other Speech-Language Pathology Functional Limitation [phone]) (None) Other Speech-Language Pathology Functional Limitation Goal Status (E7035) (None) Other Speech-Language Pathology Functional Limitation Discharge Status [phone]) (None) Dimas Aguas, MA, CCC-SLP Acute Rehab SLP [phone] Fleet Contras 02/26/2015, 10:16 AM               Scheduled Meds: . collagenase  1 application Topical Daily  . enoxaparin (LOVENOX) injection  30 mg Subcutaneous Q24H  . fluconazole  100 mg Oral Daily  . multivitamin with minerals  1 tablet Oral Daily  . mupirocin ointment   Topical BID  . sodium chloride flush  3 mL Intravenous Q12H  . tamsulosin  0.4 mg Oral Daily   Continuous Infusions: . dextrose 5 % and 0.45% NaCl 50 mL/hr at 02/27/15 0230    Time spent: > 35  Quanna Wittke, [facility]  [facility] Pager [phone]. If 7PM-7AM, please contact night-coverage at [email or web address], password [facility] 02/27/2015, 4:21 PM  LOS: 3 days

## 2015-02-27 NOTE — Clinical Social Work Note (Signed)
Patient projected to discharge today per MD (pending order) Patient will discharge to: (return) [facility] SNF/LTC RN to call report prior to transportation to: [phone] Room 313-2 Transportation: PTAR to be scheduled after dc orders/summary complete  CSW sent discharge summary to SNF for review.  Packet is complete.  RN and family (sister, [name]) aware of and agreeable to discharge plans.  Vickii Penna, LCSW [phone]  5N1-9, 2S 15-16 and Psychiatric Service Line  Licensed Clinical Social Worker

## 2015-02-27 NOTE — Clinical Social Work Note (Signed)
Clinical Social Work Assessment  Patient Details  Name: ARDA KEADLE MRN: [id number] Date of Birth: [birth year]  Date of referral:  02/27/15               Reason for consult:  Facility Placement                Permission sought to share information with:  Family Supports Permission granted to share information::  No (patient alert though oriented to self only sister is caregiver)  Name::      [name] sister)  Agency::   ([facility])  Relationship::     Contact Information:     Housing/Transportation Living arrangements for the past 2 months:    Source of Information:   (siblings) Patient Interpreter Needed:  None Criminal Activity/Legal Involvement Pertinent to Current Situation/Hospitalization:    Significant Relationships:  Siblings Lives with:  Facility Resident ([facility]) Do you feel safe going back to the place where you live?  Yes Need for family participation in patient care:  No (Coment)  Care giving concerns:  None    Office manager / plan:  Patient is alert though only oriented to self.  Patient is LTC resident at [facility].  Patient will return once discharged.  Brother was requesting CSW to look into a possible transfer to [facility].  CSW contacted liaison who states there is currently a waitlist at [facility] for LTC.  CSW provided education to sister and brother regarding patient being transferred back to [facility] and the siblings will work on the transfer from there.  Both acknowledged understanding.  Siblings are requesting PTAR transportation.  Employment status:  Retired Health and safety inspector:  Medicare PT Recommendations:    Information / Referral to community resources:  Skilled Nursing Facility  Patient/Family's Response to care:  Siblings agreeable to return to [facility] - LTC  Patient/Family's Understanding of and Emotional Response to Diagnosis, Current  Treatment, and Prognosis:  Siblings were realistic regarding placement needed at time of discharge and is agreeable to look into transfer to another LTC facility after patient returns.  Emotional Assessment Appearance:  Appears stated age Attitude/Demeanor/Rapport:  Lethargic (AMS ) Affect (typically observed):  Adaptable Orientation:  Oriented to Self Alcohol / Substance use:  Not Applicable Psych involvement (Current and /or in the community):     Discharge Needs  Concerns to be addressed:    Readmission within the last 30 days:    Current discharge risk:  None Barriers to Discharge:  No Barriers Identified   Rondel Baton, LCSW 02/27/2015, 11:01 AM

## 2015-02-27 NOTE — Progress Notes (Signed)
Inpatient Diabetes Program Recommendations  AACE/ADA: New Consensus Statement on Inpatient Glycemic Control (2015)  Target Ranges:  Prepandial:   less than 140 mg/dL      Peak postprandial:   less than 180 mg/dL (1-2 hours)      Critically ill patients:  140 - 180 mg/dL   Results for CARSEN, LEAF (MRN [id number]) as of 02/27/2015 10:52  Ref. Range 02/26/2015 08:03 02/26/2015 11:43 02/26/2015 17:00 02/26/2015 21:22 02/27/2015 00:45 02/27/2015 04:09 02/27/2015 07:20  Glucose-Capillary Latest Ref Range: 65-99 mg/dL 782 (H) 956 (H) 213 (H) 233 (H) 190 (H) 148 (H) 150 (H)   Review of Glycemic Control  Diabetes history: DM 2 Outpatient Diabetes medications: Glipizide 6 mg BID Current orders for Inpatient glycemic control: None  Inpatient Diabetes Program Recommendations: Correction (SSI): Glucose > 180 mg/dl and increased into the 200's yesterday. While inpatient, please consider Novolog Sensitive Correction TID.  Thanks,  Christena Deem RN, MSN, [facility] Inpatient Diabetes Coordinator Team Pager [phone] (8a-5p)

## 2015-02-28 ENCOUNTER — Encounter (HOSPITAL_COMMUNITY): Payer: Self-pay | Admitting: *Deleted

## 2015-02-28 LAB — GLUCOSE, CAPILLARY
GLUCOSE-CAPILLARY: 150 mg/dL — AB (ref 65–99)
Glucose-Capillary: 152 mg/dL — ABNORMAL HIGH (ref 65–99)
Glucose-Capillary: 195 mg/dL — ABNORMAL HIGH (ref 65–99)
Glucose-Capillary: 238 mg/dL — ABNORMAL HIGH (ref 65–99)

## 2015-02-28 MED ORDER — FLUCONAZOLE 100 MG PO TABS: 100.0000 mg | ORAL_TABLET | Freq: Every day | ORAL | Status: DC

## 2015-02-28 MED ORDER — HYDROCODONE-ACETAMINOPHEN 5-325 MG PO TABS: 1.0000 | ORAL_TABLET | Freq: Four times a day (QID) | ORAL | Status: DC | PRN

## 2015-02-28 MED ORDER — SENNA 8.6 MG PO TABS
1.0000 | ORAL_TABLET | Freq: Once | ORAL | Status: AC
  Administered 2015-02-28: 8.6 mg via ORAL
  Filled 2015-02-28: qty 1

## 2015-02-28 MED ORDER — ENSURE ENLIVE PO LIQD: 237.0000 mL | Freq: Two times a day (BID) | ORAL | Status: DC

## 2015-02-28 MED ORDER — GLIPIZIDE 5 MG PO TABS: 2.5000 mg | ORAL_TABLET | Freq: Two times a day (BID) | ORAL | Status: DC

## 2015-02-28 NOTE — Clinical Social Work Note (Signed)
CSW spoke with nurse regarding patient discharging status, nurse confirmed that patient was discharging today and returning to [facility]. CSW intern completed ambulance form and provided number for nurse to call rapport. Transport will be called after nurse has called rapport.

## 2015-02-28 NOTE — Discharge Summary (Signed)
Physician Discharge Summary  [name] EAV:[id number] DOB: 10/13/1920 DOA: 02/24/2015  PCP: [name] Qua, MD  Admit date: 02/24/2015 Discharge date: 02/28/2015  Time spent: > 35 minutes  Recommendations for Outpatient Follow-up:  1. Please ensure patient follows up with urology in 1 week post hospital discharge 2. Will hold Lasix and defer to primary care physician 1 to continue patient's home Lasix regimen. 3. Continue to monitor blood sugars 4. Continue monitor serum creatinine and BUN. Pain close attention to BUN/serum creatinine ratio   Discharge Diagnoses:  Principal Problem:   Acute renal failure superimposed on stage 3 chronic kidney disease (HCC) Active Problems:   Diabetic peripheral neuropathy (HCC)   HLD (hyperlipidemia)   Essential hypertension   Pacemaker-Medtronic   DM (diabetes mellitus), type 2 with renal complications (HCC)   CAD (coronary artery disease)   Diabetic ulcer of right foot associated with type 2 diabetes mellitus (HCC)   S/P BKA (below knee amputation) unilateral (HCC)   Dehydration, severe   Foley catheter in place on admission   Abnormal urinalysis   Acute renal failure (ARF) (HCC)   Diabetic hypoglycemia (HCC)   Discharge Condition stable  Diet recommendation: Carb modified diet  Filed Weights   02/26/15 2123 02/27/15 0346 02/28/15 0500  Weight: 78.4 kg (172 lb 13.5 oz) 78.4 kg (172 lb 13.5 oz) 78.4 kg (172 lb 13.5 oz)    History of present illness:  From original HPI: 82 year old male with a history of right BKA, CK D, stage III, hypertension, diabetes, who presented to the emergency bar with complaints of hypoglycemia. Patient was discharged from the skilled nursing facility on 02/13/2015 after admission for his right BKA. He was noted to be hypoglycemic upon admission as well as Acute on chronic kidney disease. Suspected to be secondary to diuresis and dehydration.  Hospital Course:  Principal Problem:  Dehydration, severe -  Condition improved with improvement in oral intake and gentle fluid hydration as well as holding of Lasix. - Resolved will hold Lasix on discharge given elevated BUN/creatinine ratio. We'll defer to primary care physician 1 to continue patient's home Lasix regimen  Acute renal failure superimposed on stage 3 chronic kidney disease (HCC) - Monitor serum creatinine most likely elevated due to prerenal etiology from dehydration  UTI - urine culture growing yeast - d/c rocephin -Patient improving on Diflucan. Will treat for 3 more days to complete a seven-day treatment course  Active Problems:  Diabetic peripheral neuropathy (HCC)  HLD (hyperlipidemia) - stable d/c on statin   Essential hypertension - Blood pressures have fluctuated. Blood pressures persistently remain elevated we'll plan on continuing home medication regimen beta blocker.   Pacemaker-Medtronic  DM (diabetes mellitus), type 2 with renal complications (HCC) - We'll continue to monitor serum glucose levels - Continue glipizide on discharge   CAD (coronary artery disease)  Diabetic ulcer of right foot associated with type 2 diabetes mellitus (HCC)  S/P BKA (below knee amputation) unilateral (HCC)  Penil ulcer -Healing currently. Patient to follow-up with urologist   Patient was retaining urine as such Foley was placed. Plan is for patient to follow-up with urologist in the next week for voiding trial  Procedures:  None  Consultations:  None  Discharge Exam: Filed Vitals:   02/27/15 2108 02/28/15 0404  BP: 173/72 131/6  Pulse: 79 75  Temp: 98 F (36.7 C) 98 F (36.7 C)  Resp: 20 18    General: Pt in nad, alert and awake  Cardiovascular: rrr, no mrg Respiratory: cta bl,  no wheezes  Discharge Instructions   Discharge Instructions    Call MD for:  extreme fatigue    Complete by:  As directed      Call MD for:  persistant dizziness or light-headedness    Complete by:  As directed      Call  MD for:  redness, tenderness, or signs of infection (pain, swelling, redness, odor or green/yellow discharge around incision site)    Complete by:  As directed      Call MD for:  temperature >100.4    Complete by:  As directed      Diet - low sodium heart healthy    Complete by:  As directed      Discharge instructions    Complete by:  As directed   Please follow up with your primary care physician in 1-2 weeks or sooner should any new concerns arise. Also follow-up with orthopedic surgeon. Will have secretary set up follow-up appointment with urology for urinary retention.     Increase activity slowly    Complete by:  As directed           Current Discharge Medication List    START taking these medications   Details  fluconazole (DIFLUCAN) 100 MG tablet Take 1 tablet (100 mg total) by mouth daily. Qty: 3 tablet, Refills: 0      CONTINUE these medications which have CHANGED   Details  glipiZIDE (GLUCOTROL) 5 MG tablet Take 0.5 tablets (2.5 mg total) by mouth 2 (two) times daily before a meal.    HYDROcodone-acetaminophen (NORCO/VICODIN) 5-325 MG tablet Take 1 tablet by mouth every 6 (six) hours as needed for moderate pain. Qty: 30 tablet, Refills: 0      CONTINUE these medications which have NOT CHANGED   Details  acetaminophen (TYLENOL) 650 MG suppository Place 650 mg rectally every 4 (four) hours as needed.    aspirin 81 MG tablet Take 81 mg by mouth daily.    bisacodyl (DULCOLAX) 10 MG suppository Place 10 mg rectally as needed for moderate constipation.    docusate sodium (COLACE) 100 MG capsule Take 100 mg by mouth 2 (two) times daily.    guaiFENesin (MUCINEX) 600 MG 12 hr tablet Take 600 mg by mouth 2 (two) times daily.    ondansetron (ZOFRAN) 4 MG tablet Take 4 mg by mouth every 6 (six) hours as needed for nausea or vomiting.    pentoxifylline (TRENTAL) 400 MG CR tablet Take 400 mg by mouth 3 (three) times daily with meals.    simvastatin (ZOCOR) 40 MG tablet Take  40 mg by mouth every evening.     Tamsulosin HCl (FLOMAX) 0.4 MG CAPS Take 0.4 mg by mouth every evening.     acetaminophen (TYLENOL) 325 MG tablet Take 2 tablets (650 mg total) by mouth every 6 (six) hours as needed for mild pain (or Fever >/= 101). Qty: 30 tablet, Refills: 3    collagenase (SANTYL) ointment Apply 1 application topically daily.    methocarbamol (ROBAXIN) 500 MG tablet Take 1 tablet (500 mg total) by mouth every 8 (eight) hours as needed for muscle spasms. Qty: 20 tablet, Refills: 0    Multiple Vitamins-Minerals (MENS MULTIVITAMIN PLUS PO) Take 1 tablet by mouth daily.    nitroGLYCERIN (NITRODUR - DOSED IN MG/24 HR) 0.2 mg/hr patch Place 0.2 mg onto the skin daily.    Probiotic Product (PHILLIPS COLON HEALTH PO) Take 1 capsule by mouth daily.      STOP taking these  medications     furosemide (LASIX) 40 MG tablet      sulfamethoxazole-trimethoprim (BACTRIM DS,SEPTRA DS) 800-160 MG tablet      metoprolol tartrate (LOPRESSOR) 25 MG tablet        Allergies  Allergen Reactions  . Ace Inhibitors     Kidney failure  . Ciprofloxacin Rash   Follow-up Information    Follow up with [facility] .   Specialty:  Skilled Nursing Facility   Contact information:   [address] [place] [address] [phone]       The results of significant diagnostics from this hospitalization (including imaging, microbiology, ancillary and laboratory) are listed below for reference.    Significant Diagnostic Studies: Dg Chest 2 View  02/24/2015  CLINICAL DATA:  Cough, pneumonia EXAM: CHEST  2 VIEW COMPARISON:  05/12/2014 FINDINGS: Cardiomediastinal silhouette is stable. Bilateral dual lead cardiac pacemaker with leads in right atrium and right ventricle is unchanged in position. Status post CABG. No acute infiltrate or pulmonary edema. Stable pleural thickening and scarring left base. Osteopenia and mild degenerative changes thoracic spine.  IMPRESSION: No active disease. Stable chronic changes left base. Status post median sternotomy. Electronically Signed   By: Natasha Mead M.D.   On: 02/24/2015 13:02   Dg Swallowing Func-speech Pathology  02/26/2015  Objective Swallowing Evaluation: Type of Study: MBS-Modified Barium Swallow Study Patient Details Name: [name] MRN: [id number] Date of Birth: 11/07/1920 Today's Date: 02/26/2015 Time: SLP Start Time (ACUTE ONLY): 0935-SLP Stop Time (ACUTE ONLY): 0958 SLP Time Calculation (min) (ACUTE ONLY): 23 min Past Medical History: Past Medical History Diagnosis Date . Hypertension  . Hyperlipidemia  . Diabetes mellitus  . Coronary artery disease    s/p CABG 1996 . Sick sinus syndrome (HCC)    s/p PPM 2001 (R sided),  RV lead fracture 10/12 with new system (MDT) placed on L side due to R sided venous occlusion . Diabetic neuropathy (HCC)  . CVD (cardiovascular disease)    s/p CEA . Peripheral vascular disease (HCC)  . Chronic kidney disease  . BPH (benign prostatic hyperplasia)  . Hypercholesteremia  . Myocardial infarction (HCC)  . CHF (congestive heart failure) (HCC)  . Ulcer of toe (HCC)  . Ulcer of heel and midfoot (HCC)  . Presence of permanent cardiac pacemaker  Past Surgical History: Past Surgical History Procedure Laterality Date . Coronary artery bypass graft   . Carotid endarterectomy   . Pacemaker insertion     s/p PPM 2001 (R sided),  RV lead fracture 10/12 with new system (MDT) placed on Left side due to R sided venous occlusion . Hernia repair   . Cataract extraction w/ intraocular lens implant Bilateral  . Insert / replace / remove pacemaker   . Wound debridement Left 07/28/2014   Procedure: DEBRIDEMENT WOUND/LEFT HEEL DEBRIDMENT;  Surgeon: Annice Needy, MD;  Location: [facility] ORS;  Service: Vascular;  Laterality: Left; . Application of wound vac Left 07/28/2014   Procedure: APPLICATION OF WOUND VAC;  Surgeon: Annice Needy, MD;  Location: [facility] ORS;  Service: Vascular;  Laterality: Left; . Amputation Right  12/21/2014   Procedure: AMPUTATION RAY/RIGHT;  Surgeon: Nadara Mustard, MD;  Location: [facility] OR;  Service: Orthopedics;  Laterality: Right; . Amputation Right 02/10/2015   Procedure: Right Below Knee Amputation;  Surgeon: Nadara Mustard, MD;  Location: [facility] OR;  Service: Orthopedics;  Laterality: Right; HPI: 82 year old male with a history of right BKA, CK D, stage III, hypertension,  diabetes, who presented to the emergency dept with complaints of hypoglycemia. CXR No active disease. Chart states report facility stated pt has had poor intake and "hasn't been acting his normal." No prior St notes Pt. states he has difficulty swallowing (could not give details). Subjective: The patient was seen in radiology for MBSS.  He was in distress due to inability to urinate.  Nurse was made aware of the trouble and planned to scan bladder when the patient returned to his room.  Assessment / Plan / Recommendation CHL IP CLINICAL IMPRESSIONS 02/26/2015 Therapy Diagnosis Mild oral phase dysphagia;Mild pharyngeal phase dysphagia Clinical Impression MBSS was completed.  The patient presented with mild oropharyngeal dysphagia characterized by mildly delayed oral transit and premature spill given dual textured solids and mildly delayed swallow trigger.  Material was in the vallecular prior to swallow initiation.  No penetration/aspiration was observed.  Of note, the patient had an intermittent cough during the MBSS that did not appear to be related to penetration/aspiration.  Esophgeal sweep revealed it mildly slow to clear.  ST will follow up for therapeutic diet tolerance.   Impact on safety and function Mild aspiration risk   CHL IP TREATMENT RECOMMENDATION 02/26/2015 Treatment Recommendations Therapy as outlined in treatment plan below   Prognosis 02/26/2015 Prognosis for Safe Diet Advancement Fair Barriers to Reach Goals -- Barriers/Prognosis Comment -- CHL IP DIET RECOMMENDATION 02/26/2015 SLP Diet Recommendations Dysphagia 3 (Mech soft)  solids;Thin liquid Liquid Administration via Cup;Straw Medication Administration Whole meds with puree Compensations Slow rate;Small sips/bites;Follow solids with liquid Postural Changes Seated upright at 90 degrees   CHL IP OTHER RECOMMENDATIONS 02/26/2015 Recommended Consults -- Oral Care Recommendations Oral care BID Other Recommendations --   CHL IP FOLLOW UP RECOMMENDATIONS 02/25/2015 Follow up Recommendations (No Data)   CHL IP FREQUENCY AND DURATION 02/26/2015 Speech Therapy Frequency (ACUTE ONLY) min 2x/week Treatment Duration 2 weeks      CHL IP ORAL PHASE 02/26/2015 Oral Phase Impaired Oral - Pudding Teaspoon -- Oral - Pudding Cup -- Oral - Honey Teaspoon -- Oral - Honey Cup -- Oral - Nectar Teaspoon -- Oral - Nectar Cup -- Oral - Nectar Straw -- Oral - Thin Teaspoon -- Oral - Thin Cup -- Oral - Thin Straw -- Oral - Puree -- Oral - Mech Soft -- Oral - Regular -- Oral - Multi-Consistency Impaired mastication;Premature spillage Oral - Pill -- Oral Phase - Comment --  CHL IP PHARYNGEAL PHASE 02/26/2015 Pharyngeal Phase Impaired Pharyngeal- Pudding Teaspoon -- Pharyngeal -- Pharyngeal- Pudding Cup -- Pharyngeal -- Pharyngeal- Honey Teaspoon -- Pharyngeal -- Pharyngeal- Honey Cup -- Pharyngeal -- Pharyngeal- Nectar Teaspoon -- Pharyngeal -- Pharyngeal- Nectar Cup -- Pharyngeal -- Pharyngeal- Nectar Straw -- Pharyngeal -- Pharyngeal- Thin Teaspoon Delayed swallow initiation-vallecula Pharyngeal -- Pharyngeal- Thin Cup Delayed swallow initiation-vallecula Pharyngeal -- Pharyngeal- Thin Straw Delayed swallow initiation-vallecula Pharyngeal -- Pharyngeal- Puree -- Pharyngeal -- Pharyngeal- Mechanical Soft -- Pharyngeal -- Pharyngeal- Regular -- Pharyngeal -- Pharyngeal- Multi-consistency -- Pharyngeal -- Pharyngeal- Pill -- Pharyngeal -- Pharyngeal Comment --  CHL IP CERVICAL ESOPHAGEAL PHASE 02/26/2015 Cervical Esophageal Phase WFL Pudding Teaspoon -- Pudding Cup -- Honey Teaspoon -- Honey Cup -- Nectar Teaspoon -- Nectar Cup  -- Nectar Straw -- Thin Teaspoon -- Thin Cup -- Thin Straw -- Puree -- Mechanical Soft -- Regular -- Multi-consistency -- Pill -- Cervical Esophageal Comment -- CHL IP GO 02/26/2015 Functional Assessment Tool Used ASHA NOMS and clinical judgment.   Functional Limitations Swallowing Swallow Current Status (Z6109) CJ Swallow Goal  Status [phone]) CJ Swallow Discharge Status [phone]) (None) Motor Speech Current Status [phone]) (None) Motor Speech Goal Status [phone]) (None) Motor Speech Goal Status [phone]) (None) Spoken Language Comprehension Current Status [phone]) (None) Spoken Language Comprehension Goal Status (V7846) (None) Spoken Language Comprehension Discharge Status [phone]) (None) Spoken Language Expression Current Status [phone]) (None) Spoken Language Expression Goal Status [phone]) (None) Spoken Language Expression Discharge Status [phone]) (None) Attention Current Status (D6644) (None) Attention Goal Status (I3474) (None) Attention Discharge Status [phone]) (None) Memory Current Status (L8756) (None) Memory Goal Status (E3329) (None) Memory Discharge Status (J1884) (None) Voice Current Status [phone]) (None) Voice Goal Status (T0160) (None) Voice Discharge Status [phone]) (None) Other Speech-Language Pathology Functional Limitation [phone]) (None) Other Speech-Language Pathology Functional Limitation Goal Status (U2025) (None) Other Speech-Language Pathology Functional Limitation Discharge Status [phone]) (None) Dimas Aguas, MA, CCC-SLP Acute Rehab SLP [phone] Fleet Contras 02/26/2015, 10:16 AM               Microbiology: Recent Results (from the past 240 hour(s))  Blood Culture (routine x 2)     Status: None (Preliminary result)   Collection Time: 02/24/15  1:24 PM  Result Value Ref Range Status   Specimen Description BLOOD LEFT HAND  Final   Special Requests BOTTLES DRAWN AEROBIC ONLY 5CC  Final   Culture NO GROWTH 4 DAYS  Final   Report Status PENDING  Incomplete  Urine culture     Status: None    Collection Time: 02/24/15  1:50 PM  Result Value Ref Range Status   Specimen Description URINE, CATHETERIZED  Final   Special Requests NONE  Final   Culture >=100,000 COLONIES/mL YEAST  Final   Report Status 02/25/2015 FINAL  Final  Blood Culture (routine x 2)     Status: None (Preliminary result)   Collection Time: 02/24/15  2:23 PM  Result Value Ref Range Status   Specimen Description BLOOD RIGHT HAND  Final   Special Requests IN PEDIATRIC BOTTLE 3CC  Final   Culture NO GROWTH 4 DAYS  Final   Report Status PENDING  Incomplete  MRSA PCR Screening     Status: None   Collection Time: 02/24/15  6:06 PM  Result Value Ref Range Status   MRSA by PCR NEGATIVE NEGATIVE Final    Comment:        The GeneXpert MRSA Assay (FDA approved for NASAL specimens only), is one component of a comprehensive MRSA colonization surveillance program. It is not intended to diagnose MRSA infection nor to guide or monitor treatment for MRSA infections.      Labs: Basic Metabolic Panel:  Recent Labs Lab 02/24/15 1653 02/25/15 0528 02/26/15 1415 02/26/15 2332 02/27/15 0617  NA 143 141 139 139 138  K 5.4* 5.3* 4.9 4.9 4.5  CL 104 105 104 103 104  CO2 26 26 26 27 26   GLUCOSE 155* 172* 250* 216* 159*  BUN 75* 67* 48* 42* 38*  CREATININE 4.41* 3.94* 2.91* 2.71* 2.34*  CALCIUM 7.0* 6.9* 7.2* 7.1* 7.1*   Liver Function Tests:  Recent Labs Lab 02/24/15 1420  AST 26  ALT 14*  ALKPHOS 80  BILITOT 0.1*  PROT 6.1*  ALBUMIN 2.0*   No results for input(s): LIPASE, AMYLASE in the last 168 hours. No results for input(s): AMMONIA in the last 168 hours. CBC:  Recent Labs Lab 02/24/15 1420 02/24/15 1653 02/25/15 0528 02/26/15 2332  WBC 9.3 8.6 8.1 8.3  NEUTROABS 7.2  --   --   --   HGB  11.3* 10.7* 10.1* 9.9*  HCT 36.8* 34.9* 33.3* 31.8*  MCV 88.2 87.3 88.6 88.3  PLT 339 333 310 286   Cardiac Enzymes:  Recent Labs Lab 02/24/15 1639  CKTOTAL 63   BNP: BNP (last 3 results) No  results for input(s): BNP in the last 8760 hours.  ProBNP (last 3 results) No results for input(s): PROBNP in the last 8760 hours.  CBG:  Recent Labs Lab 02/27/15 1954 02/28/15 0018 02/28/15 0402 02/28/15 0813 02/28/15 1207  GLUCAP 163* 238* 195* 150* 152*    Signed:  Penny Pia MD.  [facility] 02/28/2015, 3:34 PM

## 2015-02-28 NOTE — Progress Notes (Signed)
Speech Language Pathology Dysphagia Treatment Patient Details Name: [name] MRN: [id number] DOB: 1920-03-10 Today's Date: 02/28/2015 Time: 4098-1191 SLP Time Calculation (min) (ACUTE ONLY): 12 min  Assessment / Plan / Recommendation Clinical Impression    SLP provided skilled observation of POs at bedside to determine diet toleration. Pt c/o pain throughout session and presented with an intermittent cough that appeared unrelated to PO intake. Diet was upgraded by MD and pt appeared to masticate without issue. No evidence of dysphagia and no s/s of penetration/aspiration observed. Pt educated re: diet recommendation. Recommend regular diet with thin liquids, meds whole in applesauce. SLP will continue to f/u.    Diet Recommendation    Regular diet with thin liquids, meds whole in applesauce   SLP Plan Continue with current plan of care      Swallowing Goals     General Behavior/Cognition: Alert;Cooperative;Requires cueing Patient Positioning: Upright in bed HPI: 82 year old male with a history of right BKA, CK D, stage III, hypertension, diabetes, who presented to the emergency dept with complaints of hypoglycemia. CXR No active disease. Chart states report facility stated pt has had poor intake and "hasn't been acting his normal." No prior St notes Pt. states he has difficulty swallowing (could not give details).  Oral Cavity - Oral Hygiene     Dysphagia Treatment Treatment Methods: Skilled observation;Upgraded PO texture trial;Differential diagnosis;Patient/caregiver education Patient observed directly with PO's: Yes Type of PO's observed: Regular;Thin liquids Feeding: Able to feed self Liquids provided via: Straw Type of cueing: Verbal Amount of cueing: Minimal   GO     [name] 02/28/2015, 10:08 AM     [name], Student-SLP

## 2015-03-01 LAB — CULTURE, BLOOD (ROUTINE X 2)
CULTURE: NO GROWTH
Culture: NO GROWTH

## 2015-03-01 NOTE — Progress Notes (Signed)
Mary (nurse) from [facility] called to inquire about the ordered location for Santyl. Referred to discharge summary and WOC note with instruction on Santyl application.

## 2015-03-02 NOTE — Progress Notes (Signed)
Transfer from [facility] per EDP, Dr. Ilsa Iha  82 year old man with past medical history of hypertension, hyperlipidemia, diabetes mellitus, CAD, myocardial infarction, sick sinus syndrome (S/P PPM), chronic kidney disease-stage IV-V, BPH, CHF, pacemaker placement, who presents with altered mental status, hypoglycemia and shortness breast.  Patient was recently admitted to and discharged from our service from 2/3-02/28/15 because of acute on chronic kidney injury and UTI due to yeast. She was treated with Diflucan. Patient became unresponsive since this morning. She was found to have shortness of breath and hypoglycemia with blood sugar level 23 in ED. she was treated with D50, 25 mL x 2 with improvement of her sugar level to 141, but without improvement of her mental status. Patient has shortness of breath, but not actively coughing. ABG showed pH 7.26, PO2 153, PCO2 62. Chest x-ray showed right retro-diaphragmatic opacity, likely has HCAP.   Blood pressure 146/63, respiration rate 18, heart rate 101, WBC 9.7, temperature 96.1, stable renal function. Pt is accepted to [facility]. Asked EDP to start IV vancomycin and cefepime. Also asked to start D5-1/2NS at 75 cc/h.   Lorretta Harp, MD  [facility] Pager [phone]  If 7PM-7AM, please contact night-coverage [email or web address] Password [facility] 03/02/2015, 10:23 PM

## 2015-03-03 ENCOUNTER — Inpatient Hospital Stay (HOSPITAL_COMMUNITY)
Admission: AD | Admit: 2015-03-03 | Discharge: 2015-03-09 | DRG: 871 | Disposition: A | Discharge status: Skilled Nursing Facility | Payer: Medicare Other | Source: Other Acute Inpatient Hospital | Attending: Family Medicine | Admitting: Family Medicine

## 2015-03-03 ENCOUNTER — Inpatient Hospital Stay (HOSPITAL_COMMUNITY): Payer: Medicare Other

## 2015-03-03 DIAGNOSIS — E1142 Type 2 diabetes mellitus with diabetic polyneuropathy: Secondary | ICD-10-CM | POA: Diagnosis present

## 2015-03-03 DIAGNOSIS — I679 Cerebrovascular disease, unspecified: Secondary | ICD-10-CM | POA: Diagnosis present

## 2015-03-03 DIAGNOSIS — J189 Pneumonia, unspecified organism: Secondary | ICD-10-CM | POA: Diagnosis not present

## 2015-03-03 DIAGNOSIS — Z6824 Body mass index (BMI) 24.0-24.9, adult: Secondary | ICD-10-CM

## 2015-03-03 DIAGNOSIS — I959 Hypotension, unspecified: Secondary | ICD-10-CM | POA: Diagnosis present

## 2015-03-03 DIAGNOSIS — I509 Heart failure, unspecified: Secondary | ICD-10-CM

## 2015-03-03 DIAGNOSIS — Z7982 Long term (current) use of aspirin: Secondary | ICD-10-CM | POA: Diagnosis not present

## 2015-03-03 DIAGNOSIS — A419 Sepsis, unspecified organism: Principal | ICD-10-CM | POA: Diagnosis present

## 2015-03-03 DIAGNOSIS — Z950 Presence of cardiac pacemaker: Secondary | ICD-10-CM | POA: Diagnosis not present

## 2015-03-03 DIAGNOSIS — R609 Edema, unspecified: Secondary | ICD-10-CM | POA: Diagnosis not present

## 2015-03-03 DIAGNOSIS — R0602 Shortness of breath: Secondary | ICD-10-CM | POA: Diagnosis present

## 2015-03-03 DIAGNOSIS — B3749 Other urogenital candidiasis: Secondary | ICD-10-CM | POA: Diagnosis present

## 2015-03-03 DIAGNOSIS — E1151 Type 2 diabetes mellitus with diabetic peripheral angiopathy without gangrene: Secondary | ICD-10-CM | POA: Diagnosis present

## 2015-03-03 DIAGNOSIS — I442 Atrioventricular block, complete: Secondary | ICD-10-CM | POA: Diagnosis present

## 2015-03-03 DIAGNOSIS — I130 Hypertensive heart and chronic kidney disease with heart failure and stage 1 through stage 4 chronic kidney disease, or unspecified chronic kidney disease: Secondary | ICD-10-CM | POA: Diagnosis present

## 2015-03-03 DIAGNOSIS — R1312 Dysphagia, oropharyngeal phase: Secondary | ICD-10-CM | POA: Diagnosis present

## 2015-03-03 DIAGNOSIS — Z951 Presence of aortocoronary bypass graft: Secondary | ICD-10-CM

## 2015-03-03 DIAGNOSIS — E875 Hyperkalemia: Secondary | ICD-10-CM | POA: Diagnosis not present

## 2015-03-03 DIAGNOSIS — E1122 Type 2 diabetes mellitus with diabetic chronic kidney disease: Secondary | ICD-10-CM | POA: Diagnosis present

## 2015-03-03 DIAGNOSIS — N183 Chronic kidney disease, stage 3 unspecified: Secondary | ICD-10-CM | POA: Diagnosis present

## 2015-03-03 DIAGNOSIS — R7989 Other specified abnormal findings of blood chemistry: Secondary | ICD-10-CM

## 2015-03-03 DIAGNOSIS — Y95 Nosocomial condition: Secondary | ICD-10-CM | POA: Diagnosis present

## 2015-03-03 DIAGNOSIS — I252 Old myocardial infarction: Secondary | ICD-10-CM | POA: Diagnosis not present

## 2015-03-03 DIAGNOSIS — Z87891 Personal history of nicotine dependence: Secondary | ICD-10-CM | POA: Diagnosis not present

## 2015-03-03 DIAGNOSIS — J690 Pneumonitis due to inhalation of food and vomit: Secondary | ICD-10-CM | POA: Diagnosis present

## 2015-03-03 DIAGNOSIS — I2510 Atherosclerotic heart disease of native coronary artery without angina pectoris: Secondary | ICD-10-CM | POA: Diagnosis present

## 2015-03-03 DIAGNOSIS — I739 Peripheral vascular disease, unspecified: Secondary | ICD-10-CM | POA: Diagnosis present

## 2015-03-03 DIAGNOSIS — E785 Hyperlipidemia, unspecified: Secondary | ICD-10-CM | POA: Diagnosis present

## 2015-03-03 DIAGNOSIS — E11649 Type 2 diabetes mellitus with hypoglycemia without coma: Secondary | ICD-10-CM | POA: Diagnosis present

## 2015-03-03 DIAGNOSIS — M25511 Pain in right shoulder: Secondary | ICD-10-CM | POA: Diagnosis present

## 2015-03-03 DIAGNOSIS — G9340 Encephalopathy, unspecified: Secondary | ICD-10-CM | POA: Diagnosis present

## 2015-03-03 DIAGNOSIS — J9601 Acute respiratory failure with hypoxia: Secondary | ICD-10-CM | POA: Diagnosis present

## 2015-03-03 DIAGNOSIS — Z515 Encounter for palliative care: Secondary | ICD-10-CM | POA: Insufficient documentation

## 2015-03-03 DIAGNOSIS — Z7984 Long term (current) use of oral hypoglycemic drugs: Secondary | ICD-10-CM | POA: Diagnosis not present

## 2015-03-03 DIAGNOSIS — Z7189 Other specified counseling: Secondary | ICD-10-CM | POA: Insufficient documentation

## 2015-03-03 DIAGNOSIS — J989 Respiratory disorder, unspecified: Secondary | ICD-10-CM

## 2015-03-03 DIAGNOSIS — E162 Hypoglycemia, unspecified: Secondary | ICD-10-CM | POA: Diagnosis present

## 2015-03-03 DIAGNOSIS — Z8249 Family history of ischemic heart disease and other diseases of the circulatory system: Secondary | ICD-10-CM | POA: Diagnosis not present

## 2015-03-03 DIAGNOSIS — Z66 Do not resuscitate: Secondary | ICD-10-CM | POA: Diagnosis present

## 2015-03-03 DIAGNOSIS — L89152 Pressure ulcer of sacral region, stage 2: Secondary | ICD-10-CM | POA: Diagnosis present

## 2015-03-03 DIAGNOSIS — R627 Adult failure to thrive: Secondary | ICD-10-CM | POA: Diagnosis present

## 2015-03-03 DIAGNOSIS — I495 Sick sinus syndrome: Secondary | ICD-10-CM

## 2015-03-03 DIAGNOSIS — K5900 Constipation, unspecified: Secondary | ICD-10-CM | POA: Diagnosis not present

## 2015-03-03 DIAGNOSIS — I10 Essential (primary) hypertension: Secondary | ICD-10-CM | POA: Diagnosis present

## 2015-03-03 DIAGNOSIS — Z89511 Acquired absence of right leg below knee: Secondary | ICD-10-CM

## 2015-03-03 DIAGNOSIS — J40 Bronchitis, not specified as acute or chronic: Secondary | ICD-10-CM | POA: Diagnosis present

## 2015-03-03 DIAGNOSIS — R0689 Other abnormalities of breathing: Secondary | ICD-10-CM

## 2015-03-03 DIAGNOSIS — Z881 Allergy status to other antibiotic agents status: Secondary | ICD-10-CM | POA: Diagnosis not present

## 2015-03-03 DIAGNOSIS — E1129 Type 2 diabetes mellitus with other diabetic kidney complication: Secondary | ICD-10-CM | POA: Diagnosis present

## 2015-03-03 DIAGNOSIS — Z888 Allergy status to other drugs, medicaments and biological substances status: Secondary | ICD-10-CM

## 2015-03-03 DIAGNOSIS — L89623 Pressure ulcer of left heel, stage 3: Secondary | ICD-10-CM | POA: Diagnosis present

## 2015-03-03 DIAGNOSIS — I5033 Acute on chronic diastolic (congestive) heart failure: Secondary | ICD-10-CM | POA: Diagnosis present

## 2015-03-03 DIAGNOSIS — N400 Enlarged prostate without lower urinary tract symptoms: Secondary | ICD-10-CM | POA: Diagnosis present

## 2015-03-03 DIAGNOSIS — R4189 Other symptoms and signs involving cognitive functions and awareness: Secondary | ICD-10-CM | POA: Insufficient documentation

## 2015-03-03 DIAGNOSIS — N390 Urinary tract infection, site not specified: Secondary | ICD-10-CM | POA: Diagnosis present

## 2015-03-03 HISTORY — DX: Type 2 diabetes mellitus without complications: E11.9

## 2015-03-03 HISTORY — DX: Peripheral vascular disease, unspecified: I73.9

## 2015-03-03 HISTORY — DX: Pneumonia, unspecified organism: J18.9

## 2015-03-03 HISTORY — DX: Chronic kidney disease, stage 3 unspecified: N18.30

## 2015-03-03 HISTORY — DX: Chronic kidney disease, stage 3 (moderate): N18.3

## 2015-03-03 LAB — COMPREHENSIVE METABOLIC PANEL
ALBUMIN: 1.6 g/dL — AB (ref 3.5–5.0)
ALT: 21 U/L (ref 17–63)
AST: 35 U/L (ref 15–41)
Alkaline Phosphatase: 70 U/L (ref 38–126)
Anion gap: 8 (ref 5–15)
BUN: 28 mg/dL — AB (ref 6–20)
CHLORIDE: 107 mmol/L (ref 101–111)
CO2: 27 mmol/L (ref 22–32)
CREATININE: 2.33 mg/dL — AB (ref 0.61–1.24)
Calcium: 7.3 mg/dL — ABNORMAL LOW (ref 8.9–10.3)
GFR calc Af Amer: 26 mL/min — ABNORMAL LOW (ref >60)
GFR calc non Af Amer: 23 mL/min — ABNORMAL LOW (ref >60)
GLUCOSE: 93 mg/dL (ref 65–99)
POTASSIUM: 4.9 mmol/L (ref 3.5–5.1)
Sodium: 142 mmol/L (ref 135–145)
TOTAL PROTEIN: 5.3 g/dL — AB (ref 6.5–8.1)

## 2015-03-03 LAB — TROPONIN I
Troponin I: 0.12 ng/mL — ABNORMAL HIGH (ref <0.031)
Troponin I: 0.11 ng/mL — ABNORMAL HIGH (ref <0.031)
Troponin I: 0.07 ng/mL — ABNORMAL HIGH (ref <0.031)

## 2015-03-03 LAB — LACTIC ACID, PLASMA
LACTIC ACID, VENOUS: 1.1 mmol/L (ref 0.5–2.0)
Lactic Acid, Venous: 1.7 mmol/L (ref 0.5–2.0)

## 2015-03-03 LAB — GLUCOSE, CAPILLARY
GLUCOSE-CAPILLARY: 49 mg/dL — AB (ref 65–99)
GLUCOSE-CAPILLARY: 55 mg/dL — AB (ref 65–99)
GLUCOSE-CAPILLARY: 95 mg/dL (ref 65–99)
GLUCOSE-CAPILLARY: 77 mg/dL (ref 65–99)
GLUCOSE-CAPILLARY: 178 mg/dL — AB (ref 65–99)
Glucose-Capillary: 164 mg/dL — ABNORMAL HIGH (ref 65–99)
Glucose-Capillary: 91 mg/dL (ref 65–99)
Glucose-Capillary: 280 mg/dL — ABNORMAL HIGH (ref 65–99)

## 2015-03-03 LAB — STREP PNEUMONIAE URINARY ANTIGEN: Strep Pneumo Urinary Antigen: NEGATIVE

## 2015-03-03 LAB — PROTIME-INR
INR: 1.14 (ref 0.00–1.49)
PROTHROMBIN TIME: 14.8 s (ref 11.6–15.2)

## 2015-03-03 LAB — APTT: aPTT: 37 seconds (ref 24–37)

## 2015-03-03 LAB — PROCALCITONIN: Procalcitonin: 6.65 ng/mL

## 2015-03-03 LAB — MRSA PCR SCREENING: MRSA by PCR: NEGATIVE

## 2015-03-03 MED ORDER — PENTOXIFYLLINE ER 400 MG PO TBCR
400.0000 mg | EXTENDED_RELEASE_TABLET | Freq: Three times a day (TID) | ORAL | Status: DC
  Administered 2015-03-03 – 2015-03-09 (×18): 400 mg via ORAL
  Filled 2015-03-03 (×24): qty 1

## 2015-03-03 MED ORDER — FLUCONAZOLE IN SODIUM CHLORIDE 100-0.9 MG/50ML-% IV SOLN
100.0000 mg | INTRAVENOUS | Status: DC
  Administered 2015-03-03 – 2015-03-05 (×3): 100 mg via INTRAVENOUS
  Filled 2015-03-03 (×6): qty 50

## 2015-03-03 MED ORDER — SIMVASTATIN 40 MG PO TABS
40.0000 mg | ORAL_TABLET | Freq: Every evening | ORAL | Status: DC
  Administered 2015-03-03 – 2015-03-08 (×5): 40 mg via ORAL
  Filled 2015-03-03 (×5): qty 1

## 2015-03-03 MED ORDER — NITROGLYCERIN 0.2 MG/HR TD PT24
0.2000 mg | MEDICATED_PATCH | Freq: Every day | TRANSDERMAL | Status: DC
  Administered 2015-03-03 – 2015-03-09 (×7): 0.2 mg via TRANSDERMAL
  Filled 2015-03-03 (×8): qty 1

## 2015-03-03 MED ORDER — IPRATROPIUM-ALBUTEROL 0.5-2.5 (3) MG/3ML IN SOLN
3.0000 mL | Freq: Three times a day (TID) | RESPIRATORY_TRACT | Status: DC
  Administered 2015-03-04 (×2): 3 mL via RESPIRATORY_TRACT
  Filled 2015-03-03 (×3): qty 3

## 2015-03-03 MED ORDER — DOCUSATE SODIUM 100 MG PO CAPS
100.0000 mg | ORAL_CAPSULE | Freq: Two times a day (BID) | ORAL | Status: DC
  Administered 2015-03-03 – 2015-03-04 (×3): 100 mg via ORAL
  Filled 2015-03-03 (×3): qty 1

## 2015-03-03 MED ORDER — GUAIFENESIN ER 600 MG PO TB12
600.0000 mg | ORAL_TABLET | Freq: Two times a day (BID) | ORAL | Status: DC
  Administered 2015-03-03 – 2015-03-08 (×12): 600 mg via ORAL
  Filled 2015-03-03 (×12): qty 1

## 2015-03-03 MED ORDER — METHOCARBAMOL 500 MG PO TABS
500.0000 mg | ORAL_TABLET | Freq: Three times a day (TID) | ORAL | Status: DC | PRN
  Administered 2015-03-05: 500 mg via ORAL
  Filled 2015-03-03: qty 1

## 2015-03-03 MED ORDER — PHILLIPS COLON HEALTH PO CAPS: 1.0000 | ORAL_CAPSULE | Freq: Every day | ORAL | Status: DC

## 2015-03-03 MED ORDER — DEXTROSE-NACL 5-0.45 % IV SOLN
INTRAVENOUS | Status: DC
  Administered 2015-03-03: 05:00:00 via INTRAVENOUS

## 2015-03-03 MED ORDER — LEVALBUTEROL HCL 1.25 MG/0.5ML IN NEBU
1.2500 mg | INHALATION_SOLUTION | Freq: Three times a day (TID) | RESPIRATORY_TRACT | Status: DC
  Administered 2015-03-03: 1.25 mg via RESPIRATORY_TRACT

## 2015-03-03 MED ORDER — BISACODYL 10 MG RE SUPP: 10.0000 mg | RECTAL | Status: DC | PRN

## 2015-03-03 MED ORDER — GLUCOSE 40 % PO GEL
ORAL | Status: AC
  Administered 2015-03-03: 05:00:00
  Filled 2015-03-03: qty 1

## 2015-03-03 MED ORDER — LEVALBUTEROL HCL 1.25 MG/0.5ML IN NEBU
1.2500 mg | INHALATION_SOLUTION | Freq: Four times a day (QID) | RESPIRATORY_TRACT | Status: DC
  Filled 2015-03-03: qty 0.5

## 2015-03-03 MED ORDER — HYDROCORTISONE NA SUCCINATE PF 100 MG IJ SOLR
100.0000 mg | Freq: Three times a day (TID) | INTRAMUSCULAR | Status: DC
  Administered 2015-03-03 – 2015-03-04 (×3): 100 mg via INTRAVENOUS
  Filled 2015-03-03 (×3): qty 2

## 2015-03-03 MED ORDER — ADULT MULTIVITAMIN W/MINERALS CH
1.0000 | ORAL_TABLET | Freq: Every day | ORAL | Status: DC
  Administered 2015-03-03 – 2015-03-09 (×7): 1 via ORAL
  Filled 2015-03-03 (×7): qty 1

## 2015-03-03 MED ORDER — DEXTROSE 5 % IV SOLN
1.0000 g | INTRAVENOUS | Status: DC
Start: 2015-03-03 — End: 2015-03-03
  Filled 2015-03-03: qty 1

## 2015-03-03 MED ORDER — HEPARIN SODIUM (PORCINE) 5000 UNIT/ML IJ SOLN
5000.0000 [IU] | Freq: Three times a day (TID) | INTRAMUSCULAR | Status: DC
  Administered 2015-03-03 – 2015-03-09 (×19): 5000 [IU] via SUBCUTANEOUS
  Filled 2015-03-03 (×18): qty 1

## 2015-03-03 MED ORDER — VANCOMYCIN HCL IN DEXTROSE 750-5 MG/150ML-% IV SOLN
750.0000 mg | INTRAVENOUS | Status: DC
  Administered 2015-03-04: 750 mg via INTRAVENOUS
  Filled 2015-03-03: qty 150

## 2015-03-03 MED ORDER — CEFEPIME HCL 1 G IJ SOLR
1.0000 g | INTRAMUSCULAR | Status: DC
  Administered 2015-03-03 – 2015-03-05 (×3): 1 g via INTRAVENOUS
  Filled 2015-03-03 (×4): qty 1

## 2015-03-03 MED ORDER — ACETAMINOPHEN 650 MG RE SUPP: 650.0000 mg | RECTAL | Status: DC | PRN

## 2015-03-03 MED ORDER — SACCHAROMYCES BOULARDII 250 MG PO CAPS
250.0000 mg | ORAL_CAPSULE | Freq: Two times a day (BID) | ORAL | Status: DC
  Administered 2015-03-03 – 2015-03-09 (×13): 250 mg via ORAL
  Filled 2015-03-03 (×13): qty 1

## 2015-03-03 MED ORDER — SODIUM CHLORIDE 0.45 % IV SOLN
INTRAVENOUS | Status: DC
  Administered 2015-03-03: 22:00:00 via INTRAVENOUS

## 2015-03-03 MED ORDER — IPRATROPIUM-ALBUTEROL 0.5-2.5 (3) MG/3ML IN SOLN
3.0000 mL | Freq: Three times a day (TID) | RESPIRATORY_TRACT | Status: DC
Start: 2015-03-03 — End: 2015-03-03
  Administered 2015-03-03 (×2): 3 mL via RESPIRATORY_TRACT
  Filled 2015-03-03 (×2): qty 3

## 2015-03-03 MED ORDER — FLUCONAZOLE 100 MG PO TABS
100.0000 mg | ORAL_TABLET | Freq: Every day | ORAL | Status: DC
  Filled 2015-03-03: qty 1

## 2015-03-03 MED ORDER — DEXTROSE 50 % IV SOLN
25.0000 mL | INTRAVENOUS | Status: DC | PRN
  Administered 2015-03-03: 25 mL via INTRAVENOUS
  Filled 2015-03-03: qty 50

## 2015-03-03 MED ORDER — MENS MULTIVITAMIN PLUS PO TABS: 1.0000 | ORAL_TABLET | Freq: Every day | ORAL | Status: DC

## 2015-03-03 MED ORDER — HYDROCODONE-ACETAMINOPHEN 5-325 MG PO TABS
1.0000 | ORAL_TABLET | Freq: Four times a day (QID) | ORAL | Status: DC | PRN
  Administered 2015-03-03 – 2015-03-09 (×5): 1 via ORAL
  Filled 2015-03-03 (×5): qty 1

## 2015-03-03 MED ORDER — ASPIRIN 81 MG PO CHEW
81.0000 mg | CHEWABLE_TABLET | Freq: Every day | ORAL | Status: DC
  Administered 2015-03-03 – 2015-03-09 (×7): 81 mg via ORAL
  Filled 2015-03-03 (×7): qty 1

## 2015-03-03 MED ORDER — TAMSULOSIN HCL 0.4 MG PO CAPS
0.4000 mg | ORAL_CAPSULE | Freq: Every evening | ORAL | Status: DC
  Administered 2015-03-03 – 2015-03-08 (×5): 0.4 mg via ORAL
  Filled 2015-03-03 (×5): qty 1

## 2015-03-03 NOTE — Evaluation (Signed)
Clinical/Bedside Swallow Evaluation Patient Details  Name: [name] MRN: [id number] Date of Birth: Jan 10, 1921  Today's Date: 03/03/2015 Time: SLP Start Time (ACUTE ONLY): 6578 SLP Stop Time (ACUTE ONLY): 1012 SLP Time Calculation (min) (ACUTE ONLY): 14 min  Past Medical History:  Past Medical History  Diagnosis Date  . Hypertension   . Hyperlipidemia   . Diabetes mellitus   . Coronary artery disease     s/p CABG 1996  . Sick sinus syndrome (HCC)     s/p PPM 2001 (R sided),  RV lead fracture 10/12 with new system (MDT) placed on L side due to R sided venous occlusion  . Diabetic neuropathy (HCC)   . CVD (cardiovascular disease)     s/p CEA  . Peripheral vascular disease (HCC)   . Chronic kidney disease   . BPH (benign prostatic hyperplasia)   . Hypercholesteremia   . Myocardial infarction (HCC)   . CHF (congestive heart failure) (HCC)   . Ulcer of toe (HCC)   . Ulcer of heel and midfoot (HCC)   . Presence of permanent cardiac pacemaker    Past Surgical History:  Past Surgical History  Procedure Laterality Date  . Coronary artery bypass graft    . Carotid endarterectomy    . Pacemaker insertion      s/p PPM 2001 (R sided),  RV lead fracture 10/12 with new system (MDT) placed on Left side due to R sided venous occlusion  . Hernia repair    . Cataract extraction w/ intraocular lens implant Bilateral   . Insert / replace / remove pacemaker    . Wound debridement Left 07/28/2014    Procedure: DEBRIDEMENT WOUND/LEFT HEEL DEBRIDMENT;  Surgeon: Annice Needy, MD;  Location: [facility] ORS;  Service: Vascular;  Laterality: Left;  . Application of wound vac Left 07/28/2014    Procedure: APPLICATION OF WOUND VAC;  Surgeon: Annice Needy, MD;  Location: [facility] ORS;  Service: Vascular;  Laterality: Left;  . Amputation Right 12/21/2014    Procedure: AMPUTATION RAY/RIGHT;  Surgeon: Nadara Mustard, MD;  Location: [facility] OR;  Service: Orthopedics;  Laterality: Right;  . Amputation Right 02/10/2015     Procedure: Right Below Knee Amputation;  Surgeon: Nadara Mustard, MD;  Location: [facility] OR;  Service: Orthopedics;  Laterality: Right;   HPI:  82 y.o. male with PMH of hypertension, hyperlipidemia, diabetes mellitus, CAD, S/P of CABG, sick sinus syndrome, third-degree AV block, s/p of pacemaker placement, BPH, congestive heart failure, PVD (S/p of right BKA), chronic kidney disease-stage III, presents with altered mental status, hypoglycemia and shortness of breath. Patient was recently admitted and discharged 2/3-02/28/15 due to acute on chronic kidney injury and UTI. Admitted now after becoming unresponsive, shortness of breath and hypoglycemia  Chest x-ray showed right retro-diaphragmatic opacity. MBS 02/26/15 without penetration or aspiration although coughing during study, premature spill with dual textures and Dys 3 thin liquids recommended   Assessment / Plan / Recommendation Clinical Impression  Pt presented with coughing prior and during po intake. During [facility] 02/26/15 pt coughed throughout, however penetration/aspiration not observed, therefore coughing may not be related to compromised laryngeal vestibule/trachea. Missing upper dentition resulted in prolonged oral transit however appeared functional for a modified texture. Pt's appearance/medical status similar to previous admission (not dyspneic). Pt educated re: diet recommendation of  Dysphagia 3 diet, thin liquids and meds whole in puree. SLP will f/u to determine diet toleration.  (Simultaneous filing. User may not have seen previous data.)  Aspiration Risk  Mild aspiration risk    Diet Recommendation Dysphagia 3 (Mech soft);Thin liquid   Liquid Administration via: Cup;Straw Medication Administration: Whole meds with puree Supervision: Patient able to self feed;Full supervision/cueing for compensatory strategies Compensations: Minimize environmental distractions;Slow rate;Small sips/bites Postural Changes: Seated upright at 90 degrees     Other  Recommendations Oral Care Recommendations: Oral care BID   Follow up Recommendations   (TBD)    Frequency and Duration min 1 x/week  1 week       Prognosis Prognosis for Safe Diet Advancement: Fair Barriers to Reach Goals: Severity of deficits      Swallow Study   General HPI: 82 y.o. male with PMH of hypertension, hyperlipidemia, diabetes mellitus, CAD, S/P of CABG, sick sinus syndrome, third-degree AV block, s/p of pacemaker placement, BPH, congestive heart failure, PVD (S/p of right BKA), chronic kidney disease-stage III, presents with altered mental status, hypoglycemia and shortness of breath. Patient was recently admitted and discharged 2/3-02/28/15 due to acute on chronic kidney injury and UTI. Admitted now after becoming unresponsive, shortness of breath and hypoglycemia  Chest x-ray showed right retro-diaphragmatic opacity. MBS 02/26/15 without penetration or aspiration although coughing during study, premature spill with dual textures and Dys 3 thin liquids recommended Type of Study: Bedside Swallow Evaluation Previous Swallow Assessment: BSE last week Diet Prior to this Study: NPO Temperature Spikes Noted: No Respiratory Status: Nasal cannula History of Recent Intubation: No Behavior/Cognition: Alert;Cooperative;Requires cueing Oral Cavity Assessment: Within Functional Limits Oral Care Completed by SLP: No Oral Cavity - Dentition: Missing dentition;Poor condition (no upper dentition) Vision: Functional for self-feeding Self-Feeding Abilities: Able to feed self;Needs set up Patient Positioning: Upright in bed Baseline Vocal Quality: Normal Volitional Cough: Weak Volitional Swallow: Able to elicit    Oral/Motor/Sensory Function Overall Oral Motor/Sensory Function: Within functional limits   Ice Chips Ice chips: Not tested   Thin Liquid Thin Liquid: Within functional limits Presentation: Cup;Straw    Nectar Thick Nectar Thick Liquid: Not tested   Honey Thick Honey  Thick Liquid: Not tested   Puree Puree: Impaired Presentation: Spoon Pharyngeal Phase Impairments: Cough - Delayed   Solid   GO   Solid: Impaired Oral Phase Impairments: Impaired mastication Oral Phase Functional Implications: Impaired mastication;Prolonged oral transit Pharyngeal Phase Impairments: Cough - Delayed        Alcides Nutting 03/03/2015,10:39 AM  Lynita Lombard, Student-SLP

## 2015-03-03 NOTE — Progress Notes (Signed)
VASCULAR LAB PRELIMINARY  PRELIMINARY  PRELIMINARY  PRELIMINARY  Left upper extremity venous duplex completed.    Preliminary report:  Left:  Superficial thrombosis noted in the cephalic vein from the [facility] to the wrist.  No evidence of DVT.     Burech Mcfarland, RVT 03/03/2015, 3:53 PM

## 2015-03-03 NOTE — Progress Notes (Signed)
Utilization Review Completed.  

## 2015-03-03 NOTE — Evaluation (Signed)
Physical Therapy Evaluation Patient Details Name: [name] MRN: [id number] DOB: 02-15-1920 Today's Date: 03/03/2015   History of Present Illness  82 y.o. male with PMH of hypertension, hyperlipidemia, diabetes mellitus, CAD, S/P of CABG, sick sinus syndrome, third-degree AV block, s/p of pacemaker placement, BPH, congestive heart failure, PVD (S/p of right BKA), chronic kidney disease-stage III, presents with altered mental status, hypoglycemia and shortness of breath  Clinical Impression  Pt admitted with HCAP. Pt had a R BKA on 02/10/2015 and is presenting with functional limitations due to decreased activity tolerance, strength, and balance. Pt participated well with therapy. Pt's BP 76/51 upon entry to room but improved to 90/61 then 113/78 with sitting on EOB. Pt states that he was getting up to a wheelchair at the SNF but declined today. Pt would benefit from skilled acute PT services to continue to improve functional mobility and to work back towards functional independence.     Follow Up Recommendations SNF;Supervision/Assistance - 24 hour    Equipment Recommendations  None recommended by PT    Recommendations for Other Services       Precautions / Restrictions Precautions Precautions: Fall Restrictions Weight Bearing Restrictions: Yes RLE Weight Bearing: Non weight bearing      Mobility  Bed Mobility Overal bed mobility: Needs Assistance Bed Mobility: Supine to Sit;Sit to Supine     Supine to sit: Mod assist;+2 for physical assistance;HOB elevated Sit to supine: HOB elevated;Mod assist   General bed mobility comments: Pt able to move his legs off the bed, but unable to move them back on. Pt needed physical assitance with his trunk to sit up.   Transfers                 General transfer comment: Pt declined transfer and stated "my butt hurts too bad sitting up"  Ambulation/Gait                Stairs            Wheelchair Mobility     Modified Rankin (Stroke Patients Only)       Balance Overall balance assessment: Needs assistance Sitting-balance support: Feet supported;Bilateral upper extremity supported Sitting balance-Leahy Scale: Poor Sitting balance - Comments: Sat EOB for ~10 minutes with varying Min A/Min Guard to remain up right. Excessive bilateral sway.  Postural control: Right lateral lean;Left lateral lean;Other (comment) (Excessive lateral sway)                                   Pertinent Vitals/Pain Pain Assessment: No/denies pain  Pre-Activity: HR: 75 bpm, O2 Sat: 96% on 5L [place], RR: 25 bpm, BP: 76/51 mmHg During-Activity: BP: 90/61 mmHg Post-Activity: HR: 74 bpm, O2 Sat: 95% on 5L [place], RR: 23 bpm, BP: 113/78 mmHg     Home Living Family/patient expects to be discharged to:: Skilled nursing facility                      Prior Function Level of Independence: Needs assistance   Gait / Transfers Assistance Needed: Reports he has been in rehab facility since December, was able to transfer to wheelchair.  ADL's / Homemaking Assistance Needed: bathing from CNA  Comments: was living in SNF prior to admission     Hand Dominance        Extremity/Trunk Assessment   Upper Extremity Assessment: Generalized weakness  Lower Extremity Assessment: RLE deficits/detail;LLE deficits/detail;Generalized weakness RLE Deficits / Details: New BKA on 02/10/15. LLE Deficits / Details: Grossly 3/5  Cervical / Trunk Assessment: Kyphotic  Communication   Communication: HOH   Cognition Arousal/Alertness: Lethargic Behavior During Therapy: Restless Overall Cognitive Status: No family/caregiver present to determine baseline cognitive functioning       Memory: Decreased short-term memory  Pt was oriented to month and year but unable to tell what day it was and did not know why he was in the hospital. Pt very restless throughout exam but was able to carry on conversation and  follow commands.               General Comments General comments (skin integrity, edema, etc.): Bandage on L heel. R residual stump wrapped.     Exercises Amputee Exercises Hip Flexion/Marching: AROM;Right;10 reps;Seated Knee Extension: AROM;Strengthening;Right;10 reps;Seated      Assessment/Plan    PT Assessment Patient needs continued PT services  PT Diagnosis Generalized weakness;Difficulty walking   PT Problem List Decreased strength;Decreased range of motion;Decreased activity tolerance;Decreased balance;Decreased mobility;Decreased coordination;Decreased knowledge of use of DME;Impaired sensation;Decreased skin integrity  PT Treatment Interventions DME instruction;Functional mobility training;Therapeutic activities;Therapeutic exercise;Balance training;Neuromuscular re-education;Patient/family education;Wheelchair mobility training   PT Goals (Current goals can be found in the Care Plan section) Acute Rehab PT Goals Patient Stated Goal: To feel better PT Goal Formulation: With patient Time For Goal Achievement: 03/17/15 Potential to Achieve Goals: Fair    Frequency Min 2X/week   Barriers to discharge Decreased caregiver support Pt was at SNF prior to admission    Co-evaluation               End of Session Equipment Utilized During Treatment: Oxygen Activity Tolerance: Patient tolerated treatment well Patient left: in bed;with bed alarm set Nurse Communication: Mobility status         Time: 1610-9604 PT Time Calculation (min) (ACUTE ONLY): 20 min   Charges:   PT Evaluation $PT Eval Moderate Complexity: 1 Procedure     PT G Codes:       Everlean Cherry, SPT Everlean Cherry 03/03/2015, 10:05 AM

## 2015-03-03 NOTE — H&P (Addendum)
[facility] History and Physical  [name] RUE:[id number] DOB: January 09, 1921 DOA: 03/03/2015  Referring physician: ED physician PCP: Lindwood Qua, MD  Specialists:   Chief Complaint: altered mental status, hypoglycemia and shortness of breath.   HPI: [name] is a 82 y.o. male with PMH of hypertension, hyperlipidemia, diabetes mellitus, CAD, S/P of CABG, sick sinus syndrome, third-degree AV block, s/p of pacemaker placement, BPH, congestive heart failure, PVD (S/p of right BKA), chronic kidney disease-stage III, presents with altered mental status, hypoglycemia and shortness of breath.   Pt is transferred from [facility] per Dr. Ilsa Iha.  Patient was recently admitted to and discharged from our service from 2/3-02/28/15 because of acute on chronic kidney injury and UTI due to yeast. He was treated and discharged on Diflucan. Per report, patient became unresponsive since this morning. He was also found to have shortness of breath and hypoglycemia with blood sugar level 23 in ED. He was treated with D50, 25 mL x 2 with improvement of mental status. Patient has shortness of breath and dry cough.  ABG showed pH 7.26, PO2 153, PCO2 62. Chest x-ray showed right retro-diaphragmatic opacity in Ed of [facility]. When pt arrival to the floor of [place]. Mental status comes back to the baseline. He is orientated 3. He answered all questions appropriately. He reports that he has cough and mild shortness of breath, but no chest pain. He denies abdominal pain, diarrhea, symptoms of UTI. He moves all extremities (he has right BKA).  In ED of [facility], patient was found to have blood pressure 146/63, respiration rate 18, heart rate 101, WBC 9.7, temperature 96.1, stable renal function, INR 1.1, PTT 28.7, troponin 0.06-->0.08 which is gray zone in that hospital, pro-BNP 5720, positive urinalysis with 1+ leukocytes, stable renal function. Patient is admitted to inpatient for  further eval and treatment.  EKG: Independently reviewed.  QTC 509, paced rhythm  Where does patient live?  SNF  Can patient participate in ADLs? None   Review of Systems:   General: no fevers, chills, has poor appetite, has fatigue HEENT: no blurry vision, hearing changes or sore throat Pulm: has dyspnea, coughing, no wheezing CV: no chest pain, palpitations Abd: no nausea, vomiting, abdominal pain, diarrhea, constipation GU: no dysuria, burning on urination, increased urinary frequency, hematuria  Ext: no leg edema Neuro: no unilateral weakness, numbness, or tingling, no vision change or hearing loss Skin: no rash MSK: No muscle spasm, no deformity, no limitation of range of movement in spin Heme: No easy bruising.  Travel history: No recent long distant travel.  Allergy:  Allergies  Allergen Reactions  . Ace Inhibitors     Kidney failure  . Ciprofloxacin Rash    Past Medical History  Diagnosis Date  . Hypertension   . Hyperlipidemia   . Diabetes mellitus   . Coronary artery disease     s/p CABG 1996  . Sick sinus syndrome (HCC)     s/p PPM 2001 (R sided),  RV lead fracture 10/12 with new system (MDT) placed on L side due to R sided venous occlusion  . Diabetic neuropathy (HCC)   . CVD (cardiovascular disease)     s/p CEA  . Peripheral vascular disease (HCC)   . Chronic kidney disease   . BPH (benign prostatic hyperplasia)   . Hypercholesteremia   . Myocardial infarction (HCC)   . CHF (congestive heart failure) (HCC)   . Ulcer of toe (HCC)   . Ulcer of heel and  midfoot (HCC)   . Presence of permanent cardiac pacemaker     Past Surgical History  Procedure Laterality Date  . Coronary artery bypass graft    . Carotid endarterectomy    . Pacemaker insertion      s/p PPM 2001 (R sided),  RV lead fracture 10/12 with new system (MDT) placed on Left side due to R sided venous occlusion  . Hernia repair    . Cataract extraction w/ intraocular lens implant  Bilateral   . Insert / replace / remove pacemaker    . Wound debridement Left 07/28/2014    Procedure: DEBRIDEMENT WOUND/LEFT HEEL DEBRIDMENT;  Surgeon: Annice Needy, MD;  Location: [facility] ORS;  Service: Vascular;  Laterality: Left;  . Application of wound vac Left 07/28/2014    Procedure: APPLICATION OF WOUND VAC;  Surgeon: Annice Needy, MD;  Location: [facility] ORS;  Service: Vascular;  Laterality: Left;  . Amputation Right 12/21/2014    Procedure: AMPUTATION RAY/RIGHT;  Surgeon: Nadara Mustard, MD;  Location: [facility] OR;  Service: Orthopedics;  Laterality: Right;  . Amputation Right 02/10/2015    Procedure: Right Below Knee Amputation;  Surgeon: Nadara Mustard, MD;  Location: [facility] OR;  Service: Orthopedics;  Laterality: Right;    Social History:  reports that he quit smoking about 39 years ago. He has never used smokeless tobacco. He reports that he does not drink alcohol or use illicit drugs.  Family History:  Family History  Problem Relation Age of Onset  . CAD Brother      Prior to Admission medications   Medication Sig Start Date End Date Taking? Authorizing Provider  acetaminophen (TYLENOL) 325 MG tablet Take 2 tablets (650 mg total) by mouth every 6 (six) hours as needed for mild pain (or Fever >/= 101). 02/13/15   Nadara Mustard, MD  acetaminophen (TYLENOL) 650 MG suppository Place 650 mg rectally every 4 (four) hours as needed.    Historical Provider, MD  aspirin 81 MG tablet Take 81 mg by mouth daily.    Historical Provider, MD  bisacodyl (DULCOLAX) 10 MG suppository Place 10 mg rectally as needed for moderate constipation.    Historical Provider, MD  collagenase (SANTYL) ointment Apply 1 application topically daily.    Historical Provider, MD  docusate sodium (COLACE) 100 MG capsule Take 100 mg by mouth 2 (two) times daily.    Historical Provider, MD  fluconazole (DIFLUCAN) 100 MG tablet Take 1 tablet (100 mg total) by mouth daily. 03/01/15 03/03/15  Penny Pia, MD  glipiZIDE (GLUCOTROL) 5 MG tablet  Take 0.5 tablets (2.5 mg total) by mouth 2 (two) times daily before a meal. 02/28/15   Penny Pia, MD  guaiFENesin (MUCINEX) 600 MG 12 hr tablet Take 600 mg by mouth 2 (two) times daily.    Historical Provider, MD  HYDROcodone-acetaminophen (NORCO/VICODIN) 5-325 MG tablet Take 1 tablet by mouth every 6 (six) hours as needed for moderate pain. 02/28/15   Penny Pia, MD  methocarbamol (ROBAXIN) 500 MG tablet Take 1 tablet (500 mg total) by mouth every 8 (eight) hours as needed for muscle spasms. 12/23/14   Vassie Loll, MD  Multiple Vitamins-Minerals (MENS MULTIVITAMIN PLUS PO) Take 1 tablet by mouth daily.    Historical Provider, MD  nitroGLYCERIN (NITRODUR - DOSED IN MG/24 HR) 0.2 mg/hr patch Place 0.2 mg onto the skin daily.    Historical Provider, MD  ondansetron (ZOFRAN) 4 MG tablet Take 4 mg by mouth every 6 (six) hours as needed  for nausea or vomiting.    Historical Provider, MD  pentoxifylline (TRENTAL) 400 MG CR tablet Take 400 mg by mouth 3 (three) times daily with meals.    Historical Provider, MD  Probiotic Product (PHILLIPS COLON HEALTH PO) Take 1 capsule by mouth daily.    Historical Provider, MD  simvastatin (ZOCOR) 40 MG tablet Take 40 mg by mouth every evening.     Historical Provider, MD  Tamsulosin HCl (FLOMAX) 0.4 MG CAPS Take 0.4 mg by mouth every evening.     Historical Provider, MD    Physical Exam: Filed Vitals:   03/03/15 0345  BP: 98/64  Pulse: 75  Temp: 97.7 F (36.5 C)  TempSrc: Oral  Resp: 20  Weight: 78.064 kg (172 lb 1.6 oz)  SpO2: 96%   General: Not in acute distress HEENT:       Eyes: PERRL, EOMI, no scleral icterus.       ENT: No discharge from the ears and nose, no pharynx injection, no tonsillar enlargement.        Neck: No JVD, no bruit, no mass felt. Heme: No neck lymph node enlargement. Cardiac: S1/S2, RRR, No murmurs, No gallops or rubs. Pulm: Decreased air movement bilaterally. Has rales and rhonchi bilaterally. Abd: Soft, nondistended,  nontender, no rebound pain, no organomegaly, BS present. Ext: trace leg leg edema bilaterally. 2+DP/PT pulse bilaterally. R BAK. Musculoskeletal: No joint deformities, No joint redness or warmth, no limitation of ROM in spin. Skin: No rashes.  Neuro: Alert, oriented X3, cranial nerves II-XII grossly intact, moves all extremities.  Psych: Patient is not psychotic, no suicidal or hemocidal ideation.  Labs on Admission:  Basic Metabolic Panel:  Recent Labs Lab 02/24/15 1653 02/25/15 0528 02/26/15 1415 02/26/15 2332 02/27/15 0617  NA 143 141 139 139 138  K 5.4* 5.3* 4.9 4.9 4.5  CL 104 105 104 103 104  CO2 GLUCOSE 155* 172* 250* 216* 159*  BUN 75* 67* 48* 42* 38*  CREATININE 4.41* 3.94* 2.91* 2.71* 2.34*  CALCIUM 7.0* 6.9* 7.2* 7.1* 7.1*   Liver Function Tests:  Recent Labs Lab 02/24/15 1420  AST 26  ALT 14*  ALKPHOS 80  BILITOT 0.1*  PROT 6.1*  ALBUMIN 2.0*   No results for input(s): LIPASE, AMYLASE in the last 168 hours. No results for input(s): AMMONIA in the last 168 hours. CBC:  Recent Labs Lab 02/24/15 1420 02/24/15 1653 02/25/15 0528 02/26/15 2332  WBC 9.3 8.6 8.1 8.3  NEUTROABS 7.2  --   --   --   HGB 11.3* 10.7* 10.1* 9.9*  HCT 36.8* 34.9* 33.3* 31.8*  MCV 88.2 87.3 88.6 88.3  PLT 339 333 310 286   Cardiac Enzymes:  Recent Labs Lab 02/24/15 1639  CKTOTAL 63    BNP (last 3 results) No results for input(s): BNP in the last 8760 hours.  ProBNP (last 3 results) No results for input(s): PROBNP in the last 8760 hours.  CBG:  Recent Labs Lab 02/27/15 1954 02/28/15 0018 02/28/15 0402 02/28/15 0813 02/28/15 1207  GLUCAP 163* 238* 195* 150* 152*    Radiological Exams on Admission: No results found.  Assessment/Plan Principal Problem:   HCAP (healthcare-associated pneumonia) Active Problems:   Diabetic peripheral neuropathy (HCC)   HLD (hyperlipidemia)   Essential hypertension   Sick sinus syndrome (HCC)    Pacemaker-[employer]   Complete heart block (HCC)   DM (diabetes mellitus), type 2 with renal complications (HCC)   CAD (coronary artery disease)  CKD (chronic kidney disease) stage 3, GFR 30-59 ml/min   Hypertension   Hyperlipidemia   Coronary artery disease   CVD (cardiovascular disease)   Peripheral vascular disease (HCC)   BPH (benign prostatic hyperplasia)   CHF (congestive heart failure) (HCC)   Acute encephalopathy   Hypoglycemia  HCAP and sepsis: Patient's cough and shortness of breath likely caused by HCAP as shown by CXR of right retro-diaphragmatic opacity. Patient's blood pressures is soft, indicating possible sepsis. Pending lactate now. His CHF exacerbation may also contributed to his shortness of breath.  -will admit to SDU -treat HCAP, but hold diuretics for CHF due to possible sepsis. - IV Vancomycin and cefepime - Mucinex for cough  - Xopenex Neb prn for SOB - Urine legionella and S. pneumococcal antigen - Follow up blood culture x2, sputum culture - will get Procalcitonin and trend lactic acid level per sepsis protocol - gentle IV: d5-1/2 NS at 50 cc/h (patient has CHF exacerbation with pro-BNP 5720, limiting aggressive IV fluids treatmen  CHF exacerbation: Not clear which type of CHF, no 2-D echo on record. Patient has worsening shortness breath, his proBNP is elevated, indicating CHF exacerbation. Due to soft blood pressure and possible sepsis, I will hold off diuretics. -Continue aspirin -Troponin 3 -2-D echo -check BNP  Hypoglycemia: Blood sugar was 23. This is likely due to decreased oral intake and continuation of glipizide. -Hold glipizide -d5-1/2 NS at 50 cc/h  -CBG every hour -D50 when necessary  Acute encephalopathy: Most likely due to hypoglycemia. His mental status comes back to baseline along with improvement of hypoglycemia. No focal neurologic findings on physical examination. -Frequent neuro check -Treat hypoglycemia as above  DM-II: Last  A1c 7.5, not well controled. Patient is taking glipizide at home. Now presents with hypoglycemia -Hold glipizid as above  HTN: Blood pressure is soft. patient is not taking medications for home. -Monitor blood pressure closely  BPH (benign prostatic hyperplasia): -flomax  CKD-III: stable. Baseline creatinine 2.7 on 02/26/15. His creatinine is 2.10. -Follow-up will BMP  CAD (coronary artery disease): no chest pain. -continue aspirin and when necessary nitroglycerin -Follow-up troponin 3  Peripheral vascular disease (HCC): s/p of R BKA. -No acute issue     HLD: Last LDL was not on record. -Continue home medications: Zocor -Check FLP  UTI: -continue Diflucan -On vanco and cefepime -f/u Ux   DVT ppx: SQ Heparin (Pt has CKD-III, sq heparin is better choice than sq Lovenox)    Code Status: DNR Family Communication: None at bed side.   Disposition Plan: Admit to inpatient   Date of Service 03/03/2015    Lorretta Harp [facility] Pager [phone]  If 7PM-7AM, please contact night-coverage [email or web address] Password [facility] 03/03/2015, 4:39 AM

## 2015-03-03 NOTE — Progress Notes (Signed)
Hypoglycemic Event  CBG: 55  Treatment: oral gel tube  Symptoms: none  Follow-up CBG: Time:0505 CBG Result:49  Possible Reasons for Event:  unknown  Comments/MD notified: Dr Clyde Lundborg;   Increase of glucose to 164 after 25ml Dextrose 50%    [name], Alessandra Bevels

## 2015-03-03 NOTE — Progress Notes (Signed)
Pharmacy Antibiotic Note  [name] is a 82 y.o. male admitted on 03/03/2015 to [facility] unresponsive with SOB and hypoglycemia. Administered Vanc  IV at 2312 and Cefepime 1gm IV 2227 at [place].  Pharmacy has been consulted for continued Vancomycin and Cefepime dosing x 8 days for likely HCAP.  Pt recently discharged on 2/7 after being treated for acute on CKD and yeast UTI.   Labs at [facility] 2/9: WBC 9.5, SCr 2.1, est CrCl 20-25 ml/min.  Plan: Cefepime 1gm IV q24h x 8 days total Vancomycin  IV q24h x 8 days total Will f/u micro data, renal function, and pt's clinical condition Vanc trough prn   Weight: 172 lb 1.6 oz (78.064 kg)  Temp (24hrs), Avg:97.7 F (36.5 C), Min:97.7 F (36.5 C), Max:97.7 F (36.5 C)   Recent Labs Lab 02/24/15 1330  02/24/15 1420 02/24/15 1429 02/24/15 1653 02/25/15 0528 02/26/15 1415 02/26/15 2332 02/27/15 0617  WBC  --   --  9.3  --  8.6 8.1  --  8.3  --   CREATININE  --   < > 4.53*  --  4.41* 3.94* 2.91* 2.71* 2.34*  LATICACIDVEN 1.81  --   --  1.44  --   --   --   --   --   < > = values in this interval not displayed.  Estimated Creatinine Clearance: 20.8 mL/min (by C-G formula based on Cr of 2.34).    Allergies  Allergen Reactions  . Ace Inhibitors     Kidney failure  . Ciprofloxacin Rash    Antimicrobials this admission: 2/9 Vanc  >>  2/9 Cefepime >>   Dose adjustments this admission: n/a  Microbiology results: 2/10 BCx: 2/10 UCx:   2/10 Sputum:    Thank you for allowing pharmacy to be a part of this patient's care.  Christoper Fabian, PharmD, BCPS Clinical pharmacist, pager [phone] 03/03/2015 5:18 AM

## 2015-03-03 NOTE — Progress Notes (Signed)
OT NOTE  Pt is Medicare and current D/C plan is SNF. No apparent immediate acute care OT needs, therefore will defer OT to SNF. If OT eval is needed please call Acute Rehab Dept. at [phone] or text page OT at [phone].    Sussan Meter, Brynn   OTR/L Pager: [phone] Office: [phone] .   

## 2015-03-03 NOTE — Progress Notes (Signed)
Patient seen and examined, aaox3, but very lethargic, not able to provide reliable history.  intermittent congested cough noted during encounter, c/o right shoulder pain. bp borderline low. Hypoglycemia improved, cr better, reduce cbg test frequency, continue abx/antifungal passed formal swallow eval by speech with soft diet and thin liquid, continue aspiration precaution For soft blood pressure:Add stress dose steroids, awaiting echo to assess lvef, on gentle hydration for now Right shoulder pain, exam limited, patient does not move right shoulder due to pain, no erythema, no edema to right shoulder on exam, x ray pending. Left arm edema? Get venous US.

## 2015-03-03 NOTE — Progress Notes (Addendum)
Stroke swallow screen performed. Patient with difficulty swallowing water. Hesitant to take sips of water. Patient coughing after swallowing. Patient also reports trouble swallowing for "some time now" per patient.

## 2015-03-04 ENCOUNTER — Inpatient Hospital Stay (HOSPITAL_COMMUNITY): Payer: Medicare Other

## 2015-03-04 DIAGNOSIS — I509 Heart failure, unspecified: Secondary | ICD-10-CM

## 2015-03-04 DIAGNOSIS — N390 Urinary tract infection, site not specified: Secondary | ICD-10-CM

## 2015-03-04 DIAGNOSIS — E119 Type 2 diabetes mellitus without complications: Secondary | ICD-10-CM

## 2015-03-04 LAB — CBC
HCT: 29.4 % — ABNORMAL LOW (ref 39.0–52.0)
HEMOGLOBIN: 8.5 g/dL — AB (ref 13.0–17.0)
MCH: 25.8 pg — AB (ref 26.0–34.0)
MCHC: 28.9 g/dL — ABNORMAL LOW (ref 30.0–36.0)
MCV: 89.1 fL (ref 78.0–100.0)
Platelets: 228 10*3/uL (ref 150–400)
RBC: 3.3 MIL/uL — AB (ref 4.22–5.81)
RDW: 16.6 % — ABNORMAL HIGH (ref 11.5–15.5)
WBC: 7.2 10*3/uL (ref 4.0–10.5)

## 2015-03-04 LAB — URINE CULTURE: Culture: NO GROWTH

## 2015-03-04 LAB — GLUCOSE, CAPILLARY
GLUCOSE-CAPILLARY: 168 mg/dL — AB (ref 65–99)
GLUCOSE-CAPILLARY: 88 mg/dL (ref 65–99)
Glucose-Capillary: 230 mg/dL — ABNORMAL HIGH (ref 65–99)
Glucose-Capillary: 235 mg/dL — ABNORMAL HIGH (ref 65–99)
Glucose-Capillary: 249 mg/dL — ABNORMAL HIGH (ref 65–99)
Glucose-Capillary: 262 mg/dL — ABNORMAL HIGH (ref 65–99)
Glucose-Capillary: 269 mg/dL — ABNORMAL HIGH (ref 65–99)
Glucose-Capillary: 281 mg/dL — ABNORMAL HIGH (ref 65–99)

## 2015-03-04 LAB — BASIC METABOLIC PANEL
Anion gap: 11 (ref 5–15)
Anion gap: 7 (ref 5–15)
BUN: 32 mg/dL — ABNORMAL HIGH (ref 6–20)
BUN: 33 mg/dL — AB (ref 6–20)
CHLORIDE: 104 mmol/L (ref 101–111)
CHLORIDE: 107 mmol/L (ref 101–111)
CO2: 22 mmol/L (ref 22–32)
CO2: 21 mmol/L — ABNORMAL LOW (ref 22–32)
Calcium: 7.2 mg/dL — ABNORMAL LOW (ref 8.9–10.3)
Calcium: 7.1 mg/dL — ABNORMAL LOW (ref 8.9–10.3)
Creatinine, Ser: 2.38 mg/dL — ABNORMAL HIGH (ref 0.61–1.24)
Creatinine, Ser: 2.47 mg/dL — ABNORMAL HIGH (ref 0.61–1.24)
GFR calc Af Amer: 25 mL/min — ABNORMAL LOW (ref >60)
GFR calc non Af Amer: 22 mL/min — ABNORMAL LOW (ref >60)
GFR calc non Af Amer: 21 mL/min — ABNORMAL LOW (ref >60)
GFR, EST AFRICAN AMERICAN: 26 mL/min — AB (ref >60)
GLUCOSE: 285 mg/dL — AB (ref 65–99)
Glucose, Bld: 293 mg/dL — ABNORMAL HIGH (ref 65–99)
POTASSIUM: 5.9 mmol/L — AB (ref 3.5–5.1)
POTASSIUM: 5.2 mmol/L — AB (ref 3.5–5.1)
SODIUM: 137 mmol/L (ref 135–145)
Sodium: 135 mmol/L (ref 135–145)

## 2015-03-04 LAB — LIPID PANEL
CHOL/HDL RATIO: 2.8 ratio
CHOLESTEROL: 99 mg/dL (ref 0–200)
HDL: 35 mg/dL — AB (ref >40)
LDL Cholesterol: 50 mg/dL (ref 0–99)
Triglycerides: 71 mg/dL (ref <150)
VLDL: 14 mg/dL (ref 0–40)

## 2015-03-04 LAB — TSH: TSH: 1.624 u[IU]/mL (ref 0.350–4.500)

## 2015-03-04 LAB — BRAIN NATRIURETIC PEPTIDE: B Natriuretic Peptide: 396.2 pg/mL — ABNORMAL HIGH (ref 0.0–100.0)

## 2015-03-04 MED ORDER — VANCOMYCIN HCL IN DEXTROSE 1-5 GM/200ML-% IV SOLN: 1000.0000 mg | INTRAVENOUS | Status: DC

## 2015-03-04 MED ORDER — SENNOSIDES-DOCUSATE SODIUM 8.6-50 MG PO TABS
2.0000 | ORAL_TABLET | Freq: Two times a day (BID) | ORAL | Status: DC
  Administered 2015-03-04 – 2015-03-09 (×11): 2 via ORAL
  Filled 2015-03-04 (×11): qty 2

## 2015-03-04 MED ORDER — HYDROCORTISONE NA SUCCINATE PF 100 MG IJ SOLR
100.0000 mg | Freq: Three times a day (TID) | INTRAMUSCULAR | Status: DC
  Administered 2015-03-04 – 2015-03-05 (×3): 100 mg via INTRAVENOUS
  Filled 2015-03-04 (×3): qty 2

## 2015-03-04 MED ORDER — VANCOMYCIN HCL IN DEXTROSE 1-5 GM/200ML-% IV SOLN
1000.0000 mg | INTRAVENOUS | Status: DC
  Administered 2015-03-05: 1000 mg via INTRAVENOUS
  Filled 2015-03-04: qty 200

## 2015-03-04 MED ORDER — PRO-STAT SUGAR FREE PO LIQD
30.0000 mL | Freq: Two times a day (BID) | ORAL | Status: DC
  Administered 2015-03-05 – 2015-03-09 (×8): 30 mL via ORAL
  Filled 2015-03-04 (×8): qty 30

## 2015-03-04 MED ORDER — INSULIN ASPART 100 UNIT/ML ~~LOC~~ SOLN
0.0000 [IU] | Freq: Three times a day (TID) | SUBCUTANEOUS | Status: DC
  Administered 2015-03-04: 3 [IU] via SUBCUTANEOUS
  Administered 2015-03-04: 8 [IU] via SUBCUTANEOUS
  Administered 2015-03-05 (×2): 3 [IU] via SUBCUTANEOUS
  Administered 2015-03-05: 8 [IU] via SUBCUTANEOUS
  Administered 2015-03-06 (×2): 3 [IU] via SUBCUTANEOUS
  Administered 2015-03-06 – 2015-03-08 (×2): 2 [IU] via SUBCUTANEOUS
  Administered 2015-03-08: 3 [IU] via SUBCUTANEOUS
  Administered 2015-03-09: 2 [IU] via SUBCUTANEOUS

## 2015-03-04 MED ORDER — IPRATROPIUM-ALBUTEROL 0.5-2.5 (3) MG/3ML IN SOLN
3.0000 mL | RESPIRATORY_TRACT | Status: DC | PRN
Start: 2015-03-04 — End: 2015-03-09

## 2015-03-04 MED ORDER — POLYETHYLENE GLYCOL 3350 17 G PO PACK
17.0000 g | PACK | Freq: Every day | ORAL | Status: DC
  Administered 2015-03-04 – 2015-03-09 (×5): 17 g via ORAL
  Filled 2015-03-04 (×5): qty 1

## 2015-03-04 NOTE — Progress Notes (Signed)
  Echocardiogram 2D Echocardiogram has been performed.  Delcie Roch 03/04/2015, 5:50 PM

## 2015-03-04 NOTE — Progress Notes (Signed)
PROGRESS NOTE  [name] ZOX:[id number] DOB: 10-Sep-1920 DOA: 03/03/2015 PCP: Lindwood Qua, MD  HPI/Recap of past 24 hours:  More alert this am, reported feeling better,less congested cough, cloudy urine in foley bag  Assessment/Plan: Principal Problem:   HCAP (healthcare-associated pneumonia) Active Problems:   Diabetic peripheral neuropathy (HCC)   HLD (hyperlipidemia)   Essential hypertension   Sick sinus syndrome (HCC)   Pacemaker-Medtronic   Complete heart block (HCC)   DM (diabetes mellitus), type 2 with renal complications (HCC)   CAD (coronary artery disease)   CKD (chronic kidney disease) stage 3, GFR 30-59 ml/min   Hypertension   Hyperlipidemia   Coronary artery disease   CVD (cardiovascular disease)   Peripheral vascular disease (HCC)   BPH (benign prostatic hyperplasia)   CHF exacerbation (HCC)   Acute encephalopathy   Hypoglycemia   UTI (urinary tract infection)  HCAP and sepsis:  Patient's cough and shortness of breath likely caused by HCAP as shown by CXR of right retro-diaphragmatic opacity. Patient's blood pressures is soft, indicating possible sepsis. Pending lactate now. His CHF exacerbation may also contributed to his shortness of breath.  -patient is admit to SDU -treat HCAP, but hold diuretics for CHF due to possible sepsis. - IV Vancomycin and cefepime - Mucinex for cough  - Xopenex Neb prn for SOB - Urine legionella pending and S. pneumococcal antigen pending - Follow up blood culture x2, sputum culture - 2/11patient also received a few dose of stress dose steroids, bp better, d/c steroids, d/c ivf. Continue abx.  CHF exacerbation: Not clear which type of CHF, no 2-D echo on record. Patient has worsening shortness breath, his proBNP is elevated, indicating CHF exacerbation. Due to soft blood pressure and possible sepsis, continue to hold off diuretics. -Continue aspirin -Troponin 3, mild and flat -2-D echo still pending -BNP  300  Hypoglycemia:  Blood sugar was 23. This is likely due to decreased oral intake and continuation of glipizide. -Hold glipizide. S/p d5-1/2 NS at 50 cc/h  -resolved. D/c ivf  Acute encephalopathy:  Most likely due to hypoglycemia. His mental status comes back to baseline along with improvement of hypoglycemia. No focal neurologic findings on physical examination. -better  DM-II:  Last A1c 7.5, not well controled. Patient is taking glipizide at home. Now presents with hypoglycemia -Hold glipizid as above -2/11blood sugar now elevated, start ssi.  HTN:  Blood pressure is soft. patient is not taking medications for home. -Monitor blood pressure closely  BPH (benign prostatic hyperplasia): -flomax  CKD-III: stable. Baseline creatinine 2.7 on 02/26/15. His creatinine is 2.10. -Follow-up will BMP, renal dosing meds  CAD (coronary artery disease): no chest pain. -continue aspirin and when necessary nitroglycerin -Follow-up troponin 3, flat  Peripheral vascular disease (HCC): s/p of R BKA. -No acute issue   HLD: Last LDL was not on record. -Continue home medications: Zocor -ldl 50  UTI: -continue Diflucan -On vanco and cefepime -f/u Ux   DVT ppx: SQ Heparin (Pt has CKD-III, sq heparin is better choice than sq Lovenox)    Code Status: DNR Family Communication: None at bed side.  Disposition Plan: snf at discharge   Consultants:  none  Procedures:  none  Antibiotics:  vanc/cefepime   Objective: BP 113/57 mmHg  Pulse 83  Temp(Src) 97.9 F (36.6 C) (Oral)  Resp 21  Wt 78.064 kg (172 lb 1.6 oz)  SpO2 100%  Intake/Output Summary (Last 24 hours) at 03/04/15 1050 Last data filed at 03/04/15 0900  Gross per 24  hour  Intake 828.33 ml  Output    325 ml  Net 503.33 ml   Filed Weights   03/03/15 0345  Weight: 78.064 kg (172 lb 1.6 oz)    Exam:   General:  Frail, more alert, oriented x3  Cardiovascular: paced rhythm  Respiratory:  +rhonchi, no wheezing  Abdomen: Soft/ND/NT, positive BS  Musculoskeletal: s/p right bka, left upper extremity mild edema,   Neuro: now more alert  Data Reviewed: Basic Metabolic Panel:  Recent Labs Lab 02/26/15 1415 02/26/15 2332 02/27/15 0617 03/03/15 0640 03/04/15 0233  NA 139 139 138 142 137  K 4.9 4.9 4.5 4.9 5.9*  CL 104 103 104 107 104  CO2 GLUCOSE 250* 216* 159* 93 293*  BUN 48* 42* 38* 28* 32*  CREATININE 2.91* 2.71* 2.34* 2.33* 2.38*  CALCIUM 7.2* 7.1* 7.1* 7.3* 7.2*   Liver Function Tests:  Recent Labs Lab 03/03/15 0640  AST 35  ALT 21  ALKPHOS 70  BILITOT <0.1*  PROT 5.3*  ALBUMIN 1.6*   No results for input(s): LIPASE, AMYLASE in the last 168 hours. No results for input(s): AMMONIA in the last 168 hours. CBC:  Recent Labs Lab 02/26/15 2332 03/04/15 0233  WBC 8.3 7.2  HGB 9.9* 8.5*  HCT 31.8* 29.4*  MCV 88.3 89.1  PLT 286 228   Cardiac Enzymes:    Recent Labs Lab 03/03/15 0640 03/03/15 1115 03/03/15 1830  TROPONINI 0.12* 0.11* 0.07*   BNP (last 3 results)  Recent Labs  03/04/15 0233  BNP 396.2*    ProBNP (last 3 results) No results for input(s): PROBNP in the last 8760 hours.  CBG:  Recent Labs Lab 03/03/15 1709 03/03/15 2135 03/03/15 2350 03/04/15 0208 03/04/15 0314  GLUCAP 178* 280* 230* 269* 281*    Recent Results (from the past 240 hour(s))  Blood Culture (routine x 2)     Status: None   Collection Time: 02/24/15  1:24 PM  Result Value Ref Range Status   Specimen Description BLOOD LEFT HAND  Final   Special Requests BOTTLES DRAWN AEROBIC ONLY 5CC  Final   Culture NO GROWTH 5 DAYS  Final   Report Status 03/01/2015 FINAL  Final  Urine culture     Status: None   Collection Time: 02/24/15  1:50 PM  Result Value Ref Range Status   Specimen Description URINE, CATHETERIZED  Final   Special Requests NONE  Final   Culture >=100,000 COLONIES/mL YEAST  Final   Report Status 02/25/2015 FINAL  Final   Blood Culture (routine x 2)     Status: None   Collection Time: 02/24/15  2:23 PM  Result Value Ref Range Status   Specimen Description BLOOD RIGHT HAND  Final   Special Requests IN PEDIATRIC BOTTLE 3CC  Final   Culture NO GROWTH 5 DAYS  Final   Report Status 03/01/2015 FINAL  Final  MRSA PCR Screening     Status: None   Collection Time: 02/24/15  6:06 PM  Result Value Ref Range Status   MRSA by PCR NEGATIVE NEGATIVE Final    Comment:        The GeneXpert MRSA Assay (FDA approved for NASAL specimens only), is one component of a comprehensive MRSA colonization surveillance program. It is not intended to diagnose MRSA infection nor to guide or monitor treatment for MRSA infections.   Culture, blood (routine x 2) Call MD if unable to obtain prior to antibiotics being given  Status: None (Preliminary result)   Collection Time: 03/27/2015  6:24 AM  Result Value Ref Range Status   Specimen Description BLOOD RIGHT HAND  Final   Special Requests BOTTLES DRAWN AEROBIC ONLY 4CC  Final   Culture NO GROWTH 1 DAY  Final   Report Status PENDING  Incomplete  MRSA PCR Screening     Status: None   Collection Time: Mar 27, 2015  7:52 AM  Result Value Ref Range Status   MRSA by PCR NEGATIVE NEGATIVE Final    Comment:        The GeneXpert MRSA Assay (FDA approved for NASAL specimens only), is one component of a comprehensive MRSA colonization surveillance program. It is not intended to diagnose MRSA infection nor to guide or monitor treatment for MRSA infections.   Culture, blood (routine x 2) Call MD if unable to obtain prior to antibiotics being given     Status: None (Preliminary result)   Collection Time: Mar 27, 2015 11:18 AM  Result Value Ref Range Status   Specimen Description BLOOD BLOOD RIGHT HAND  Final   Special Requests IN PEDIATRIC BOTTLE 3CC  Final   Culture NO GROWTH < 24 HOURS  Final   Report Status PENDING  Incomplete     Studies: Dg Shoulder Right Port  2015/03/27   CLINICAL DATA:  Right acromioclavicular joint pain, no known injury, initial encounter EXAM: PORTABLE RIGHT SHOULDER - 2+ VIEW COMPARISON:  None. FINDINGS: Degenerative changes of the acromioclavicular joint are seen. No fracture or dislocation is noted. No gross soft tissue abnormality is noted. IMPRESSION: Acromioclavicular joint degenerative change without acute abnormality. Electronically Signed   By: Alcide Clever M.D.   On: March 27, 2015 12:38    Scheduled Meds: . aspirin  81 mg Oral Daily  . ceFEPime (MAXIPIME) IV  1 g Intravenous Q24H  . fluconazole (DIFLUCAN) IV  100 mg Intravenous Q24H  . guaiFENesin  600 mg Oral BID  . heparin  5,000 Units Subcutaneous 3 times per day  . insulin aspart  0-15 Units Subcutaneous TID WC  . ipratropium-albuterol  3 mL Nebulization TID  . multivitamin with minerals  1 tablet Oral Daily  . nitroGLYCERIN  0.2 mg Transdermal Daily  . pentoxifylline  400 mg Oral TID WC  . polyethylene glycol  17 g Oral Daily  . saccharomyces boulardii  250 mg Oral BID  . senna-docusate  2 tablet Oral BID  . simvastatin  40 mg Oral QPM  . tamsulosin  0.4 mg Oral QPM  . vancomycin  750 mg Intravenous Q24H    Continuous Infusions:    Time spent:  Mack Thurmon MD, PhD  [facility] Pager [phone]. If 7PM-7AM, please contact night-coverage at [email or web address], password [facility] 03/04/2015, 10:50 AM  LOS: 1 day

## 2015-03-04 NOTE — Progress Notes (Signed)
Pharmacy Antibiotic Note  [name] is a 82 y.o. male admitted on 03/03/2015 with pneumonia.  Pharmacy has been consulted for Vancomycin and Cefepime dosing.  Plan: 82yo male readmitted after 2/7 discharge, with HCAP.    Cr is up a bit today.  He is afebrile and WBC wnl.  Will adjust Vancomycin d/t increased Cr today and continue to watch renal fxn closely.  Weight: 172 lb 1.6 oz (78.064 kg)  Temp (24hrs), Avg:97.8 F (36.6 C), Min:97.5 F (36.4 C), Max:98.5 F (36.9 C)   Recent Labs Lab 02/26/15 2332 02/27/15 0617 03/03/15 0640 03/03/15 0641 03/03/15 1115 03/04/15 0233 03/04/15 1252  WBC 8.3  --   --   --   --  7.2  --   CREATININE 2.71* 2.34* 2.33*  --   --  2.38* 2.47*  LATICACIDVEN  --   --   --  1.7 1.1  --   --     Estimated Creatinine Clearance: 19.7 mL/min (by C-G formula based on Cr of 2.47).    Allergies  Allergen Reactions  . Ace Inhibitors     Kidney failure  . Ciprofloxacin Rash    Antimicrobials this admission: Vanc 2/10 >>  Cefepime 2/10 >>   Microbiology results: 2/10 BCx: ntd 2/10 UCx: ntd  2/10 Sputum: does not appear to be drawn  2/10 MRSA PCR: neg  Thank you for allowing pharmacy to be a part of this patient's care.  [name] 03/04/2015 2:37 PM

## 2015-03-04 NOTE — Progress Notes (Signed)
Initial Nutrition Assessment  DOCUMENTATION CODES:  Not applicable  INTERVENTION:  Magic cup TID with meals, each supplement provides 290 kcal and 9 grams of protein  Prostat 30 mls BID. Each 30 mls provides 100 kcals and 15 grams pro  MVI with minerals to provide adequate substrate for wound healing  NUTRITION DIAGNOSIS:  Increased nutrient needs related to Pressure wound healing as evidenced by estimated nutritional requirements for the condition  GOAL:  Patient will meet greater than or equal to 90% of their needs  MONITOR:  PO intake, Supplement acceptance, Labs, Skin  REASON FOR ASSESSMENT:  Consult Assessment of nutrition requirement/status  ASSESSMENT:  82 y/o male PMH of HTN, HLD, DM, CAD, S/P of CABG, sick sinus syndrome, third-degree AV block, s/p of pacemaker placement, BPH, CHF, PVD (S/p of right BKA), CKD III, presents with AMS, hypoglycemia and SOB. Worked up for HCAP, CHF exacerbation.  Family member reports that since pt lost his leg (1/20) he has not had a good appetite. Pt apparently eats better in the hospital then he does at home. Pt was not on any type of diet at home as he doesn't really eat that much anyway. Pt does not like Ensure/Boost or milkier substances. He was given Magic cup on his last admission a little over a week ago though family member does not know if he liked it or not. Will also try prostat.   Family member states pt has constipation and is being treated with a mild stool softener. Pt has history of dysphagia and was downgraded to mechanical soft. Family \\member  reports that even on the downgraded diet, Pt has had some instances of choking, specifically on beef, gravy, noodles. RD asked if family member thought pt would benefit from minced food; he did not think so. He think it was just that certain food.   Family member states that pt was weighed at 144 at a recent stay in nursing home. Bed weight today was 187.5 lbs.   RD tried to talk to  patient. Shook his head when asked if he wanted anything or was hungry. RD stated to patient he would receive magic cup on his tray and he should try best to eat it. Pt nodded in agreement.  NFPE: WDL  Labs reviewed: Hyperglycemic, hyperkalemic, hypocalcemic  Diet Order:  DIET DYS 3 Room service appropriate?: Yes; Fluid consistency:: Thin  Skin:  MSAD buttocks, Ecchymotic arms, R BKA, unstageable PU L heel, PU stage 2 sacrum  Last BM:  PTA  Height:  Ht Readings from Last 1 Encounters:  02/28/15  (1.778 m)   Weight:  Wt Readings from Last 1 Encounters:  03/03/15 172 lb 1.6 oz (78.064 kg)   Wt Readings from Last 10 Encounters:  03/03/15 172 lb 1.6 oz (78.064 kg)  02/28/15 172 lb 13.5 oz (78.4 kg)  02/10/15 200 lb (90.719 kg)  12/19/14 195 lb (88.451 kg)  07/28/14 200 lb (90.719 kg)  07/19/14 200 lb (90.719 kg)  04/22/14 206 lb (93.441 kg)  12/13/13 213 lb 1.9 oz (96.671 kg)  06/08/13 210 lb (95.255 kg)  12/10/12 206 lb 3.2 oz (93.532 kg)   Ideal Body Weight:  70.55 kg  BMI:  Adjusted Body mass index is 26.4 kg/(m^2).  Estimated Nutritional Needs:  Kcal:  1900-2100 (24-27 kcal/kg bw) Protein:  100-117 (1.3-1.5 g/kg bw) Fluid:  1.9-2.1 liters  EDUCATION NEEDS:  No education needs identified at this time  Christophe Louis RD, LDN Nutrition Pager: [phone] 03/04/2015 3:36 PM

## 2015-03-05 ENCOUNTER — Inpatient Hospital Stay (HOSPITAL_COMMUNITY): Payer: Medicare Other

## 2015-03-05 DIAGNOSIS — I739 Peripheral vascular disease, unspecified: Secondary | ICD-10-CM

## 2015-03-05 LAB — BASIC METABOLIC PANEL
Anion gap: 9 (ref 5–15)
BUN: 35 mg/dL — ABNORMAL HIGH (ref 6–20)
CHLORIDE: 106 mmol/L (ref 101–111)
CO2: 22 mmol/L (ref 22–32)
CREATININE: 2.38 mg/dL — AB (ref 0.61–1.24)
Calcium: 7.4 mg/dL — ABNORMAL LOW (ref 8.9–10.3)
GFR calc non Af Amer: 22 mL/min — ABNORMAL LOW (ref >60)
GFR, EST AFRICAN AMERICAN: 26 mL/min — AB (ref >60)
GLUCOSE: 163 mg/dL — AB (ref 65–99)
Potassium: 5.4 mmol/L — ABNORMAL HIGH (ref 3.5–5.1)
SODIUM: 137 mmol/L (ref 135–145)

## 2015-03-05 LAB — CBC
HCT: 29.8 % — ABNORMAL LOW (ref 39.0–52.0)
HEMOGLOBIN: 8.9 g/dL — AB (ref 13.0–17.0)
MCH: 25.9 pg — AB (ref 26.0–34.0)
MCHC: 29.9 g/dL — ABNORMAL LOW (ref 30.0–36.0)
MCV: 86.9 fL (ref 78.0–100.0)
Platelets: 282 10*3/uL (ref 150–400)
RBC: 3.43 MIL/uL — AB (ref 4.22–5.81)
RDW: 16.4 % — ABNORMAL HIGH (ref 11.5–15.5)
WBC: 7.6 10*3/uL (ref 4.0–10.5)

## 2015-03-05 LAB — GLUCOSE, CAPILLARY
GLUCOSE-CAPILLARY: 201 mg/dL — AB (ref 65–99)
Glucose-Capillary: 197 mg/dL — ABNORMAL HIGH (ref 65–99)
Glucose-Capillary: 281 mg/dL — ABNORMAL HIGH (ref 65–99)
Glucose-Capillary: 165 mg/dL — ABNORMAL HIGH (ref 65–99)

## 2015-03-05 LAB — BRAIN NATRIURETIC PEPTIDE: B Natriuretic Peptide: 398.3 pg/mL — ABNORMAL HIGH (ref 0.0–100.0)

## 2015-03-05 MED ORDER — FLUCONAZOLE 100 MG PO TABS
100.0000 mg | ORAL_TABLET | Freq: Every day | ORAL | Status: AC
  Administered 2015-03-06 – 2015-03-07 (×2): 100 mg via ORAL
  Filled 2015-03-05 (×2): qty 1

## 2015-03-05 MED ORDER — IPRATROPIUM-ALBUTEROL 0.5-2.5 (3) MG/3ML IN SOLN
3.0000 mL | Freq: Four times a day (QID) | RESPIRATORY_TRACT | Status: DC
  Administered 2015-03-05 – 2015-03-06 (×5): 3 mL via RESPIRATORY_TRACT
  Filled 2015-03-05 (×5): qty 3

## 2015-03-05 MED ORDER — HYDROCORTISONE NA SUCCINATE PF 100 MG IJ SOLR
50.0000 mg | Freq: Three times a day (TID) | INTRAMUSCULAR | Status: DC
  Administered 2015-03-05 – 2015-03-06 (×3): 50 mg via INTRAVENOUS
  Filled 2015-03-05 (×3): qty 2

## 2015-03-05 NOTE — Progress Notes (Addendum)
PROGRESS NOTE  [name] BJY:[id number] DOB: [birth year] DOA: 03/03/2015 PCP: Lindwood Qua, MD  HPI/Recap of past 24 hours:  alert this am, persistent congested cough, dose not look comfortable, not able to provide detailed history,  urine more clear in foley bag today  Assessment/Plan: Principal Problem:   HCAP (healthcare-associated pneumonia) Active Problems:   Diabetic peripheral neuropathy (HCC)   HLD (hyperlipidemia)   Essential hypertension   Sick sinus syndrome (HCC)   Pacemaker-Medtronic   Complete heart block (HCC)   DM (diabetes mellitus), type 2 with renal complications (HCC)   CAD (coronary artery disease)   CKD (chronic kidney disease) stage 3, GFR 30-59 ml/min   Hypertension   Hyperlipidemia   Coronary artery disease   CVD (cardiovascular disease)   Peripheral vascular disease (HCC)   BPH (benign prostatic hyperplasia)   CHF exacerbation (HCC)   Acute encephalopathy   Hypoglycemia   UTI (urinary tract infection)  HCAP and sepsis:  Patient's cough and shortness of breath likely caused by HCAP as shown by CXR of right retro-diaphragmatic opacity. Patient's blood pressures is soft, indicating possible sepsis. Pending lactate now. His CHF exacerbation may also contributed to his shortness of breath.  -patient is admit to SDU -treat HCAP, but hold diuretics for CHF due to possible sepsis. - IV Vancomycin and cefepime from admission, vanc d/ed on 2/11 (mrsa screen negative) - Mucinex for cough  - Xopenex Neb prn for SOB - Urine legionella pending and S. pneumococcal antigen pending - Follow up blood culture x2, sputum culture - 2/11patient also received a few dose of stress dose steroids, bp better, d/c steroids, d/c ivf. Continue abx. h 2/12 has tobe put back on stress dose steroids due to borderline bp, persistent congested cough, add nebs, get CT chest  CHF exacerbation: Not clear which type of CHF, no 2-D echo on record. Patient has worsening  shortness breath, his proBNP is elevated, indicating CHF exacerbation. Due to soft blood pressure and possible sepsis, continue to hold off diuretics. -Continue aspirin -Troponin 3, mild and flat -2-D echo lvef 40-45%, grade 1 diastolic dysfunction -BNP 300  Hypoglycemia:  Blood sugar was 23. This is likely due to decreased oral intake and continuation of glipizide. -Hold glipizide. S/p d5-1/2 NS at 50 cc/h  -resolved. D/c ivf  Acute encephalopathy:  Most likely due to hypoglycemia. His mental status comes back to baseline along with improvement of hypoglycemia. No focal neurologic findings on physical examination. -better  DM-II:  Last A1c 7.5, not well controled. Patient is taking glipizide at home. Now presents with hypoglycemia -Hold glipizid as above -2/11blood sugar now elevated, start ssi.  HTN:  Blood pressure is soft. patient is not taking medications for home. -Monitor blood pressure closely  BPH (benign prostatic hyperplasia): -flomax  CKD-III: stable. Baseline creatinine 2.7 on 02/26/15. His creatinine is 2.10. -Follow-up will BMP, renal dosing meds  CAD (coronary artery disease): no chest pain. -continue aspirin and when necessary nitroglycerin -Follow-up troponin 3, flat  Peripheral vascular disease (HCC): s/p of R BKA. -No acute issue   HLD: Last LDL was not on record. -Continue home medications: Zocor -ldl 50  UTI: -continue Diflucan -received vanco and cefepime from admission, vanc d/ced on 2/11 -f/u Ux   DVT ppx: SQ Heparin (Pt has CKD-III, sq heparin is better choice than sq Lovenox)    Code Status: DNR Family Communication: None at bed side.  Disposition Plan: snf at discharge   Consultants:  none  Procedures:  none  Antibiotics:  vanc/cefepime   Objective: BP 137/66 mmHg  Pulse 77  Temp(Src) 97.5 F (36.4 C) (Oral)  Resp 36  Wt 78.064 kg (172 lb 1.6 oz)  SpO2 95%  Intake/Output Summary (Last 24 hours) at 03/05/15  1004 Last data filed at 03/05/15 1610  Gross per 24 hour  Intake   1130 ml  Output    600 ml  Net    530 ml   Filed Weights   03/03/15 0345  Weight: 78.064 kg (172 lb 1.6 oz)    Exam:   General:  Frail, more alert, oriented x3  Cardiovascular: paced rhythm  Respiratory: +rhonchi, no wheezing  Abdomen: Soft/ND/NT, positive BS  Musculoskeletal: s/p right bka, left upper extremity mild edema, left heal with protective dressing.  Neuro: now more alert  Data Reviewed: Basic Metabolic Panel:  Recent Labs Lab 02/27/15 0617 03/03/15 0640 03/04/15 0233 03/04/15 1252 03/05/15 0332  NA 138 142 137 135 137  K 4.5 4.9 5.9* 5.2* 5.4*  CL 104 107 104 107 106  CO2 26 27 22  21* 22  GLUCOSE 159* 93 293* 285* 163*  BUN 38* 28* 32* 33* 35*  CREATININE 2.34* 2.33* 2.38* 2.47* 2.38*  CALCIUM 7.1* 7.3* 7.2* 7.1* 7.4*   Liver Function Tests:  Recent Labs Lab 03/03/15 0640  AST 35  ALT 21  ALKPHOS 70  BILITOT <0.1*  PROT 5.3*  ALBUMIN 1.6*   No results for input(s): LIPASE, AMYLASE in the last 168 hours. No results for input(s): AMMONIA in the last 168 hours. CBC:  Recent Labs Lab 02/26/15 2332 03/04/15 0233 03/05/15 0332  WBC 8.3 7.2 7.6  HGB 9.9* 8.5* 8.9*  HCT 31.8* 29.4* 29.8*  MCV 88.3 89.1 86.9  PLT 286 228 282   Cardiac Enzymes:    Recent Labs Lab 03/03/15 0640 03/03/15 1115 03/03/15 1830  TROPONINI 0.12* 0.11* 0.07*   BNP (last 3 results)  Recent Labs  03/04/15 0233 03/05/15 0332  BNP 396.2* 398.3*    ProBNP (last 3 results) No results for input(s): PROBNP in the last 8760 hours.  CBG:  Recent Labs Lab 03/04/15 0719 03/04/15 1152 03/04/15 1541 03/04/15 2203 03/05/15 0724  GLUCAP 235* 262* 168* 88 201*    Recent Results (from the past 240 hour(s))  Blood Culture (routine x 2)     Status: None   Collection Time: 02/24/15  1:24 PM  Result Value Ref Range Status   Specimen Description BLOOD LEFT HAND  Final   Special Requests  BOTTLES DRAWN AEROBIC ONLY 5CC  Final   Culture NO GROWTH 5 DAYS  Final   Report Status 03/01/2015 FINAL  Final  Urine culture     Status: None   Collection Time: 02/24/15  1:50 PM  Result Value Ref Range Status   Specimen Description URINE, CATHETERIZED  Final   Special Requests NONE  Final   Culture >=100,000 COLONIES/mL YEAST  Final   Report Status 02/25/2015 FINAL  Final  Blood Culture (routine x 2)     Status: None   Collection Time: 02/24/15  2:23 PM  Result Value Ref Range Status   Specimen Description BLOOD RIGHT HAND  Final   Special Requests IN PEDIATRIC BOTTLE 3CC  Final   Culture NO GROWTH 5 DAYS  Final   Report Status 03/01/2015 FINAL  Final  MRSA PCR Screening     Status: None   Collection Time: 02/24/15  6:06 PM  Result Value Ref Range Status   MRSA by PCR NEGATIVE NEGATIVE Final  Comment:        The GeneXpert MRSA Assay (FDA approved for NASAL specimens only), is one component of a comprehensive MRSA colonization surveillance program. It is not intended to diagnose MRSA infection nor to guide or monitor treatment for MRSA infections.   Culture, blood (routine x 2) Call MD if unable to obtain prior to antibiotics being given     Status: None (Preliminary result)   Collection Time: 03/03/15  6:24 AM  Result Value Ref Range Status   Specimen Description BLOOD RIGHT HAND  Final   Special Requests BOTTLES DRAWN AEROBIC ONLY 4CC  Final   Culture NO GROWTH 1 DAY  Final   Report Status PENDING  Incomplete  Urine culture     Status: None   Collection Time: 03/03/15  6:57 AM  Result Value Ref Range Status   Specimen Description URINE, CATHETERIZED  Final   Special Requests NONE  Final   Culture NO GROWTH 1 DAY  Final   Report Status 03/04/2015 FINAL  Final  MRSA PCR Screening     Status: None   Collection Time: 03/03/15  7:52 AM  Result Value Ref Range Status   MRSA by PCR NEGATIVE NEGATIVE Final    Comment:        The GeneXpert MRSA Assay (FDA approved for  NASAL specimens only), is one component of a comprehensive MRSA colonization surveillance program. It is not intended to diagnose MRSA infection nor to guide or monitor treatment for MRSA infections.   Culture, blood (routine x 2) Call MD if unable to obtain prior to antibiotics being given     Status: None (Preliminary result)   Collection Time: 03/03/15 11:18 AM  Result Value Ref Range Status   Specimen Description BLOOD BLOOD RIGHT HAND  Final   Special Requests IN PEDIATRIC BOTTLE 3CC  Final   Culture NO GROWTH < 24 HOURS  Final   Report Status PENDING  Incomplete     Studies: No results found.  Scheduled Meds: . aspirin  81 mg Oral Daily  . ceFEPime (MAXIPIME) IV  1 g Intravenous Q24H  . feeding supplement (PRO-STAT SUGAR FREE 64)  30 mL Oral BID  . fluconazole (DIFLUCAN) IV  100 mg Intravenous Q24H  . guaiFENesin  600 mg Oral BID  . heparin  5,000 Units Subcutaneous 3 times per day  . hydrocortisone sod succinate (SOLU-CORTEF) inj  50 mg Intravenous Q8H  . insulin aspart  0-15 Units Subcutaneous TID WC  . ipratropium-albuterol  3 mL Nebulization Q6H  . multivitamin with minerals  1 tablet Oral Daily  . nitroGLYCERIN  0.2 mg Transdermal Daily  . pentoxifylline  400 mg Oral TID WC  . polyethylene glycol  17 g Oral Daily  . saccharomyces boulardii  250 mg Oral BID  . senna-docusate  2 tablet Oral BID  . simvastatin  40 mg Oral QPM  . tamsulosin  0.4 mg Oral QPM    Continuous Infusions:    Time spent:  Devesh Monforte MD, PhD  [facility] Pager [phone]. If 7PM-7AM, please contact night-coverage at [email or web address], password [email or web address] 03/05/2015, 10:04 AM  LOS: 2 days

## 2015-03-06 ENCOUNTER — Inpatient Hospital Stay (HOSPITAL_COMMUNITY): Payer: Medicare Other

## 2015-03-06 DIAGNOSIS — I5042 Chronic combined systolic (congestive) and diastolic (congestive) heart failure: Secondary | ICD-10-CM

## 2015-03-06 DIAGNOSIS — I442 Atrioventricular block, complete: Secondary | ICD-10-CM

## 2015-03-06 LAB — BASIC METABOLIC PANEL
Anion gap: 10 (ref 5–15)
BUN: 43 mg/dL — ABNORMAL HIGH (ref 6–20)
CHLORIDE: 104 mmol/L (ref 101–111)
CO2: 24 mmol/L (ref 22–32)
CREATININE: 2.15 mg/dL — AB (ref 0.61–1.24)
Calcium: 7.6 mg/dL — ABNORMAL LOW (ref 8.9–10.3)
GFR calc non Af Amer: 25 mL/min — ABNORMAL LOW (ref >60)
GFR, EST AFRICAN AMERICAN: 29 mL/min — AB (ref >60)
GLUCOSE: 149 mg/dL — AB (ref 65–99)
Potassium: 4.7 mmol/L (ref 3.5–5.1)
Sodium: 138 mmol/L (ref 135–145)

## 2015-03-06 LAB — LEGIONELLA ANTIGEN, URINE

## 2015-03-06 LAB — HEMOGLOBIN A1C
HEMOGLOBIN A1C: 7.2 % — AB (ref 4.8–5.6)
MEAN PLASMA GLUCOSE: 160 mg/dL

## 2015-03-06 LAB — CBC
HEMATOCRIT: 28.8 % — AB (ref 39.0–52.0)
HEMOGLOBIN: 8.6 g/dL — AB (ref 13.0–17.0)
MCH: 25.6 pg — AB (ref 26.0–34.0)
MCHC: 29.9 g/dL — AB (ref 30.0–36.0)
MCV: 85.7 fL (ref 78.0–100.0)
Platelets: 271 10*3/uL (ref 150–400)
RBC: 3.36 MIL/uL — ABNORMAL LOW (ref 4.22–5.81)
RDW: 16.5 % — ABNORMAL HIGH (ref 11.5–15.5)
WBC: 7.8 10*3/uL (ref 4.0–10.5)

## 2015-03-06 LAB — GLUCOSE, CAPILLARY
GLUCOSE-CAPILLARY: 165 mg/dL — AB (ref 65–99)
GLUCOSE-CAPILLARY: 163 mg/dL — AB (ref 65–99)
Glucose-Capillary: 133 mg/dL — ABNORMAL HIGH (ref 65–99)
Glucose-Capillary: 115 mg/dL — ABNORMAL HIGH (ref 65–99)

## 2015-03-06 MED ORDER — TECHNETIUM TC 99M DIETHYLENETRIAME-PENTAACETIC ACID: 30.0000 | Freq: Once | INTRAVENOUS | Status: DC | PRN

## 2015-03-06 MED ORDER — TECHNETIUM TO 99M ALBUMIN AGGREGATED
4.0000 | Freq: Once | INTRAVENOUS | Status: AC | PRN
  Administered 2015-03-06: 4 via INTRAVENOUS

## 2015-03-06 MED ORDER — DOXYCYCLINE HYCLATE 100 MG PO TABS
100.0000 mg | ORAL_TABLET | Freq: Two times a day (BID) | ORAL | Status: DC
  Administered 2015-03-06 – 2015-03-09 (×7): 100 mg via ORAL
  Filled 2015-03-06 (×7): qty 1

## 2015-03-06 MED ORDER — IPRATROPIUM-ALBUTEROL 0.5-2.5 (3) MG/3ML IN SOLN
3.0000 mL | Freq: Three times a day (TID) | RESPIRATORY_TRACT | Status: DC
  Administered 2015-03-06 – 2015-03-07 (×4): 3 mL via RESPIRATORY_TRACT
  Filled 2015-03-06 (×4): qty 3

## 2015-03-06 MED ORDER — BISACODYL 10 MG RE SUPP
10.0000 mg | RECTAL | Status: DC
  Administered 2015-03-06 – 2015-03-08 (×2): 10 mg via RECTAL
  Filled 2015-03-06 (×2): qty 1

## 2015-03-06 MED ORDER — MINERAL OIL RE ENEM
1.0000 | ENEMA | Freq: Once | RECTAL | Status: AC
  Administered 2015-03-06: 1 via RECTAL
  Filled 2015-03-06: qty 1

## 2015-03-06 NOTE — Progress Notes (Signed)
PROGRESS NOTE  [name]:[id number] DOB: September 12, 1920 DOA: 03/03/2015 PCP: Lindwood Qua, MD  HPI/Recap of past 24 hours:  Patient reported feeling better, no  Cough noticed during encounter today, currently on room air, RN reported patient desats into the 80's  with minimal exertion, son in room  Assessment/Plan: Principal Problem:   HCAP (healthcare-associated pneumonia) Active Problems:   Diabetic peripheral neuropathy (HCC)   HLD (hyperlipidemia)   Essential hypertension   Sick sinus syndrome (HCC)   Pacemaker-[employer]   Complete heart block (HCC)   DM (diabetes mellitus), type 2 with renal complications (HCC)   CAD (coronary artery disease)   CKD (chronic kidney disease) stage 3, GFR 30-59 ml/min   Hypertension   Hyperlipidemia   Coronary artery disease   CVD (cardiovascular disease)   Peripheral vascular disease (HCC)   BPH (benign prostatic hyperplasia)   CHF exacerbation (HCC)   Acute encephalopathy   Hypoglycemia   UTI (urinary tract infection)  HCAP ? SOB, hypoxia:   Patient's cough and shortness of breath though likely caused by HCAP as shown by CXR of right retro-diaphragmatic opacity.  However Ct chest did not show evidence of pneumonia, but did reveal "Chronic small pleural effusion/collection at the left lung base with associated wall calcifications, the source of the chronic retro-diaphragmatic opacity described on chest x-ray of 03/02/2015."  - Urine legionella pending and S. pneumococcal antigen pending - blood culture x2 no growth, sputum culture not able to obtain.  -He was treated with IV Vancomycin and cefepime from admission, vanc d/ed on 2/11 (mrsa screen negative), cefepime d/ced on 2/13 after ct chest did not reveal pna, change to oral doxycyclin. Continue nebs/mucinex, now able to wean off oxygen at rest. -His CHF exacerbation may also contributed to his shortness of breath. Though ct chest did not review significant pulmonary edema  either.  2/13 Rn report patient does has intermittent hypoxia with exertion, Will get VQ scan to r/o PE, patient does has risk factor with recent right BKA and decreased mobility at baseline. , not able to get CTA due to cr elevation.  Sepsis? Presented initially on admission: with hypotension that responded to stress dose steroids/abx. Possible source bronchitis (he did have cough) uti.  - 2/11patient also received a few dose of stress dose steroids, bp better, d/c steroids, d/c ivf. Continue abx.  2/12 has to be put back on stress dose steroids due to borderline bp, persistent congested cough, add nebs, CT chest no acute findings. 2/13 bp better, d/c stress dose steroids.  CHF exacerbation?:  Patient has worsening shortness breath, his proBNP is elevated, indicating CHF exacerbation. Due to soft blood pressure and possible sepsis, continue to hold off diuretics. -Continue aspirin -Troponin 3, mild and flat -2-D echo lvef 40-45%, grade 1 diastolic dysfunction -BNP 300 -current seems euvolemic, with poor oral intake will continue to hold lasix.  Likely will discharge to [facility] with low dose lasix on MWF due to prone to have dehydration.  Hypoglycemia:  Blood sugar was 23. This is likely due to decreased oral intake and continuation of glipizide. -Hold glipizide. S/p d5-1/2 NS at 50 cc/h  -resolved. D/c ivf  Acute encephalopathy:  Most likely due to hypoglycemia. His mental status comes back to baseline along with improvement of hypoglycemia. No focal neurologic findings on physical examination. -better  DM-II:  Last A1c 7.5, not well controled. Patient is taking glipizide at home. Now presents with hypoglycemia -Hold glipizid as above -2/11blood sugar now elevated, start ssi. Likely will  need to discharge to snf with ssi, and close monitor blood sugar to prevent hypoglycemia episode.  H/o HTN:  Blood pressure is soft. patient is not taking medications for home. -Monitor blood  pressure closely -he required stress dose steroid for blood pressure support.  BPH (benign prostatic hyperplasia): Patient was discharged to snf with foley from last hospitalization on 2/7 -continue flomax, need outpatient urology follow up for voiding trial.  CKD-III: stable. Baseline creatinine 2.7 on 02/26/15. His creatinine is 2.10. -Follow-up will BMP, renal dosing meds  CAD (coronary artery disease): no chest pain. -continue aspirin and when necessary nitroglycerin -Follow-up troponin 3, flat  Peripheral vascular disease (HCC): s/p of R BKA. -No acute issue   HLD: Last LDL was not on record. 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, wound care consulted, will need to f/u with Dr Lajoyce Corners at discharge  DVT ppx: SQ Heparin (Pt has CKD-III, sq heparin is better choice than sq Lovenox)    Code Status: DNR Family Communication: patient and son in room Disposition Plan: return to snf at discharge, likely 2/14   Consultants:  none  Procedures:  V/Q scan  Antibiotics:  vanc/cefepime   Objective: BP 105/54 mmHg  Pulse 75  Temp(Src) 97.8 F (36.6 C) (Axillary)  Resp 28  Wt 78.064 kg (172 lb 1.6 oz)  SpO2 98%  Intake/Output Summary (Last 24 hours) at 03/06/15 1524 Last data filed at 03/06/15 1000  Gross per 24 hour  Intake    290 ml  Output    575 ml  Net   -285 ml   Filed Weights   03/03/15 0345  Weight: 78.064 kg (172 lb 1.6 oz)    Exam:   General:  Frail, more alert, oriented x3  Cardiovascular: paced rhythm  Respiratory: improved lung exam,  no wheezing, no rales,  Abdomen: Soft/ND/NT, positive BS, foley in place  Musculoskeletal: s/p right bka, left upper extremity mild edema, left heal with protective dressing.   Neuro: now more alert  Data Reviewed: Basic Metabolic Panel:  Recent Labs Lab  03/03/15 0640 03/04/15 0233 03/04/15 1252 03/05/15 0332 03/06/15 0240  NA 142 137 135 137 138  K 4.9 5.9* 5.2* 5.4* 4.7  CL 107 104 107 106 104  CO2 27 22 21* 22 24  GLUCOSE 93 293* 285* 163* 149*  BUN 28* 32* 33* 35* 43*  CREATININE 2.33* 2.38* 2.47* 2.38* 2.15*  CALCIUM 7.3* 7.2* 7.1* 7.4* 7.6*   Liver Function Tests:  Recent Labs Lab 03/03/15 0640  AST 35  ALT 21  ALKPHOS 70  BILITOT <0.1*  PROT 5.3*  ALBUMIN 1.6*   No results for input(s): LIPASE, AMYLASE in the last 168 hours. No results for input(s): AMMONIA in the last 168 hours. CBC:  Recent Labs Lab 03/04/15 0233 03/05/15 0332 03/06/15 0240  WBC 7.2 7.6 7.8  HGB 8.5* 8.9* 8.6*  HCT 29.4* 29.8* 28.8*  MCV 89.1 86.9 85.7  PLT 228 282 271   Cardiac Enzymes:    Recent Labs Lab 03/03/15 0640 03/03/15 1115 03/03/15 1830  TROPONINI 0.12* 0.11* 0.07*   BNP (last 3 results)  Recent Labs  03/04/15 0233 03/05/15 0332  BNP 396.2* 398.3*    ProBNP (last 3 results) No results for input(s): PROBNP in the last 8760 hours.  CBG:  Recent Labs Lab 03/05/15 1225 03/05/15 1720 03/05/15 2211 03/06/15 1610  03/06/15 1256  GLUCAP 197* 281* 165* 133* 165*    Recent Results (from the past 240 hour(s))  MRSA PCR Screening     Status: None   Collection Time: 02/24/15  6:06 PM  Result Value Ref Range Status   MRSA by PCR NEGATIVE NEGATIVE Final    Comment:        The GeneXpert MRSA Assay (FDA approved for NASAL specimens only), is one component of a comprehensive MRSA colonization surveillance program. It is not intended to diagnose MRSA infection nor to guide or monitor treatment for MRSA infections.   Culture, blood (routine x 2) Call MD if unable to obtain prior to antibiotics being given     Status: None (Preliminary result)   Collection Time: 03/03/15  6:24 AM  Result Value Ref Range Status   Specimen Description BLOOD RIGHT HAND  Final   Special Requests BOTTLES DRAWN AEROBIC ONLY 4CC   Final   Culture NO GROWTH 3 DAYS  Final   Report Status PENDING  Incomplete  Urine culture     Status: None   Collection Time: 03/03/15  6:57 AM  Result Value Ref Range Status   Specimen Description URINE, CATHETERIZED  Final   Special Requests NONE  Final   Culture NO GROWTH 1 DAY  Final   Report Status 03/04/2015 FINAL  Final  MRSA PCR Screening     Status: None   Collection Time: 03/03/15  7:52 AM  Result Value Ref Range Status   MRSA by PCR NEGATIVE NEGATIVE Final    Comment:        The GeneXpert MRSA Assay (FDA approved for NASAL specimens only), is one component of a comprehensive MRSA colonization surveillance program. It is not intended to diagnose MRSA infection nor to guide or monitor treatment for MRSA infections.   Culture, blood (routine x 2) Call MD if unable to obtain prior to antibiotics being given     Status: None (Preliminary result)   Collection Time: 03/03/15 11:18 AM  Result Value Ref Range Status   Specimen Description BLOOD BLOOD RIGHT HAND  Final   Special Requests IN PEDIATRIC BOTTLE 3CC  Final   Culture NO GROWTH 3 DAYS  Final   Report Status PENDING  Incomplete     Studies: No results found.  Scheduled Meds: . aspirin  81 mg Oral Daily  . doxycycline  100 mg Oral Q12H  . feeding supplement (PRO-STAT SUGAR FREE 64)  30 mL Oral BID  . fluconazole  100 mg Oral Daily  . guaiFENesin  600 mg Oral BID  . heparin  5,000 Units Subcutaneous 3 times per day  . insulin aspart  0-15 Units Subcutaneous TID WC  . ipratropium-albuterol  3 mL Nebulization Q6H  . multivitamin with minerals  1 tablet Oral Daily  . nitroGLYCERIN  0.2 mg Transdermal Daily  . pentoxifylline  400 mg Oral TID WC  . polyethylene glycol  17 g Oral Daily  . saccharomyces boulardii  250 mg Oral BID  . senna-docusate  2 tablet Oral BID  . simvastatin  40 mg Oral QPM  . tamsulosin  0.4 mg Oral QPM    Continuous Infusions:    Time spent:  Lou Loewe MD, PhD  [facility] Pager [phone]. If 7PM-7AM, please contact night-coverage at [email or web address], password [facility] 03/06/2015, 3:24 PM  LOS: 3 days

## 2015-03-06 NOTE — Care Management Important Message (Signed)
Important Message  Patient Details  Name: [name] MRN: [id number] Date of Birth: 05-Feb-1920   Medicare Important Message Given:  Yes    Oralia Rud Barbera Perritt 03/06/2015, 1:59 PM

## 2015-03-06 NOTE — Progress Notes (Signed)
Per request of patient, son Dio Giller, was notified of patient"s arrival to [address].

## 2015-03-06 NOTE — Clinical Documentation Improvement (Signed)
Internal Medicine  Abnormal Lab/Test Results:  03/04/15: K= 5.9, 5.2; 03/05/15: K= 5.4  Possible Clinical Conditions associated with below indicators  Hyperkalemia  Other Condition  Cannot Clinically Determine   Please exercise your independent, professional judgment when responding. A specific answer is not anticipated or expected.   Thank You,  Cherylann Ratel, RN, BSN Health Information Management [place] [phone]

## 2015-03-06 NOTE — Progress Notes (Signed)
Physical Therapy Treatment Patient Details Name: [name] MRN: [id number] DOB: 04/24/1920 Today's Date: 03/06/2015    History of Present Illness 82 y.o. male with PMH of hypertension, hyperlipidemia, diabetes mellitus, CAD, S/P of CABG, sick sinus syndrome, third-degree AV block, s/p of pacemaker placement, BPH, congestive heart failure, PVD (S/p of right BKA), chronic kidney disease-stage III, presents with altered mental status, hypoglycemia and shortness of breath    PT Comments    Pt admitted with above diagnosis. Pt presents with functional limitations due to decreased functional activity tolerance, decreased strength, and decreased balance. Pt participated with therapy today but moaned and grimaced the entire session although pt denied pain. Pt stated that he was constipated and wanted to get to the bedside commode but pt was unable to transfer with max +2 assistance after 3 attempts. Pt will continue to benefit from PT services to continue to improve functional mobility.    Follow Up Recommendations  SNF;Supervision/Assistance - 24 hour     Equipment Recommendations  None recommended by PT    Recommendations for Other Services       Precautions / Restrictions Precautions Precautions: Fall Restrictions Weight Bearing Restrictions: Yes RLE Weight Bearing: Non weight bearing    Mobility  Bed Mobility Overal bed mobility: Needs Assistance;+2 for physical assistance Bed Mobility: Rolling;Supine to Sit;Sit to Supine Rolling: Mod assist;+2 for physical assistance   Supine to sit: Max assist;+2 for physical assistance;HOB elevated Sit to supine: Max assist;+2 for physical assistance   General bed mobility comments: Pt able to move his legs off the bed, but unable to move them back on. Pt needed physical assitance with his trunk to sit up.   Transfers Overall transfer level: Needs assistance Equipment used: None Transfers: Sit to/from Stand Sit to Stand: Max  assist;+2 physical assistance;+2 safety/equipment;From elevated surface         General transfer comment: Attempted to stand to help pt get to bedside commode, but unable to stand after 3 attempts. Pt unable to stand with Max +2 assistance.   Ambulation/Gait                 Stairs            Wheelchair Mobility    Modified Rankin (Stroke Patients Only)       Balance Overall balance assessment: Needs assistance Sitting-balance support: Feet supported;Bilateral upper extremity supported Sitting balance-Leahy Scale: Fair Sitting balance - Comments: Sat EOB for ~10 minutes to perform exercises. Pt able to remain upright with MinGuard for safety.                             Cognition Arousal/Alertness: Awake/alert;Lethargic Behavior During Therapy: WFL for tasks assessed/performed;Restless Overall Cognitive Status: Difficult to assess       Memory: Decreased short-term memory              Exercises General Exercises - Lower Extremity Long Arc Quad: AROM;Left;20 reps;Seated Hip Flexion/Marching: Left;10 reps;Seated;AROM Amputee Exercises Hip Flexion/Marching: AROM;Right;10 reps;Seated Knee Flexion: AROM;Right;Seated;20 reps Knee Extension: AROM;AAROM;Right;20 reps;Seated (ROM Limited, assisted extension at end range. )    General Comments General comments (skin integrity, edema, etc.): Pt states that he is constipated and wishes he could go to the bathroom. Pt moaned and grimaced the entire session but still participated in therapy.       Pertinent Vitals/Pain Pain Assessment: Faces (Pt denied pain, but grimaced and moaned throughout session. ) Faces Pain Scale: Hurts  whole lot Pain Location: Bottom Pain Descriptors / Indicators: Grimacing;Moaning Pain Intervention(s): Limited activity within patient's tolerance;Monitored during session;Repositioned  Vitals stable throughout session on room air.     Home Living                       Prior Function            PT Goals (current goals can now be found in the care plan section) Acute Rehab PT Goals Patient Stated Goal: To go to the bathroom PT Goal Formulation: With patient Potential to Achieve Goals: Fair Progress towards PT goals: Progressing toward goals    Frequency  Min 2X/week    PT Plan Current plan remains appropriate    Co-evaluation             End of Session Equipment Utilized During Treatment: Gait belt Activity Tolerance: Patient limited by pain Patient left: in bed;with call bell/phone within reach;with family/visitor present;with bed alarm set     Time: 1450-1515 PT Time Calculation (min) (ACUTE ONLY): 25 min  Charges:  $Therapeutic Exercise: 8-22 mins $Therapeutic Activity: 8-22 mins                    G Codes:      Anette Riedel, SPT [name] 03/06/2015, 4:20 PM

## 2015-03-06 NOTE — Consult Note (Addendum)
WOC wound consult note Reason for Consult: Stage 3 pressure injury to left posterior heel at Achille's area, surgical incisoni at BKA amputation site, closely approximated.  Staples intact..  Please request order to remove staples from surgeon.  Stage 2 tissue loss at sacrum. Wound type:Surgical, Pressure Pressure Ulcer POA: Yes Measurement:left posterior heel:  10cm x 3cm x 0.4cm.  Pink, pale and non-granulating wound bed with moderate amount light yellow exudate.  20cm closely approximated surgical incision with staples intact on right stump.No drainage. Sacral area of pressure injury is resolving; 4cm x 8cm x 0.2cm.  Patient turns easily from side to side and the soft silicone foam dressing in place has promoted reepithelialization. Wound bed:As described above Drainage (amount, consistency, odor) As described above. Periwound:Intact. Dressing procedure/placement/frequency: I have provided nursing with orders for care of right stump, but an order should come from the surgeon to remove staples.  Care guidance is provided for the Stage 3 Pressure injury to the left heel and the resolving Stage 2 pressure injury. WOC nursing team will not follow, but will remain available to this patient, the nursing and medical teams.  Please re-consult if needed. Thanks, Ladona Mow, MSN, RN, GNP, [place]  Pager# [phone]

## 2015-03-07 ENCOUNTER — Inpatient Hospital Stay (HOSPITAL_COMMUNITY): Payer: Medicare Other

## 2015-03-07 DIAGNOSIS — R4189 Other symptoms and signs involving cognitive functions and awareness: Secondary | ICD-10-CM | POA: Insufficient documentation

## 2015-03-07 DIAGNOSIS — R404 Transient alteration of awareness: Secondary | ICD-10-CM

## 2015-03-07 LAB — BASIC METABOLIC PANEL
ANION GAP: 9 (ref 5–15)
BUN: 46 mg/dL — AB (ref 6–20)
CHLORIDE: 105 mmol/L (ref 101–111)
CO2: 23 mmol/L (ref 22–32)
Calcium: 7.5 mg/dL — ABNORMAL LOW (ref 8.9–10.3)
Creatinine, Ser: 2.02 mg/dL — ABNORMAL HIGH (ref 0.61–1.24)
GFR calc Af Amer: 31 mL/min — ABNORMAL LOW (ref >60)
GFR, EST NON AFRICAN AMERICAN: 27 mL/min — AB (ref >60)
GLUCOSE: 108 mg/dL — AB (ref 65–99)
POTASSIUM: 4.7 mmol/L (ref 3.5–5.1)
Sodium: 137 mmol/L (ref 135–145)

## 2015-03-07 LAB — GLUCOSE, CAPILLARY
Glucose-Capillary: 112 mg/dL — ABNORMAL HIGH (ref 65–99)
Glucose-Capillary: 111 mg/dL — ABNORMAL HIGH (ref 65–99)
Glucose-Capillary: 155 mg/dL — ABNORMAL HIGH (ref 65–99)
Glucose-Capillary: 147 mg/dL — ABNORMAL HIGH (ref 65–99)
Glucose-Capillary: 141 mg/dL — ABNORMAL HIGH (ref 65–99)

## 2015-03-07 MED ORDER — INSULIN ASPART 100 UNIT/ML ~~LOC~~ SOLN: SUBCUTANEOUS | Status: AC

## 2015-03-07 MED ORDER — METOPROLOL TARTRATE 25 MG PO TABS: 12.5000 mg | ORAL_TABLET | Freq: Two times a day (BID) | ORAL | Status: AC

## 2015-03-07 MED ORDER — BOOST / RESOURCE BREEZE PO LIQD
1.0000 | Freq: Two times a day (BID) | ORAL | Status: DC
  Administered 2015-03-07 – 2015-03-08 (×2): 1 via ORAL

## 2015-03-07 MED ORDER — FLUCONAZOLE 100 MG PO TABS: 100.0000 mg | ORAL_TABLET | Freq: Every day | ORAL | Status: DC

## 2015-03-07 MED ORDER — BOOST / RESOURCE BREEZE PO LIQD: 1.0000 | Freq: Two times a day (BID) | ORAL | Status: AC

## 2015-03-07 MED ORDER — DOXYCYCLINE HYCLATE 100 MG PO TABS: 100.0000 mg | ORAL_TABLET | Freq: Two times a day (BID) | ORAL | Status: DC

## 2015-03-07 MED ORDER — HYDROCODONE-ACETAMINOPHEN 5-325 MG PO TABS: 1.0000 | ORAL_TABLET | Freq: Four times a day (QID) | ORAL | Status: AC | PRN

## 2015-03-07 MED ORDER — PRO-STAT SUGAR FREE PO LIQD: 30.0000 mL | Freq: Two times a day (BID) | ORAL | Status: AC

## 2015-03-07 MED ORDER — FUROSEMIDE 40 MG PO TABS: 40.0000 mg | ORAL_TABLET | Freq: Every day | ORAL | Status: AC | PRN

## 2015-03-07 MED ORDER — IPRATROPIUM-ALBUTEROL 0.5-2.5 (3) MG/3ML IN SOLN
3.0000 mL | Freq: Three times a day (TID) | RESPIRATORY_TRACT | Status: DC
  Administered 2015-03-08 – 2015-03-09 (×4): 3 mL via RESPIRATORY_TRACT
  Filled 2015-03-07 (×5): qty 3

## 2015-03-07 MED ORDER — SENNOSIDES-DOCUSATE SODIUM 8.6-50 MG PO TABS: 2.0000 | ORAL_TABLET | Freq: Every day | ORAL | Status: AC

## 2015-03-07 MED ORDER — INSULIN ASPART 100 UNIT/ML CARTRIDGE (PENFILL): SUBCUTANEOUS | Status: DC

## 2015-03-07 NOTE — Progress Notes (Addendum)
PROGRESS NOTE  [name]:[id number] DOB: 06-20-1920 DOA: 03/03/2015 PCP: Lindwood Qua, MD  HPI/Recap of past 24 hours:  Patient reported feeling better, no  Cough noticed during encounter today, currently on room air,  son in room  Assessment/Plan: Principal Problem:   HCAP (healthcare-associated pneumonia) Active Problems:   Diabetic peripheral neuropathy (HCC)   HLD (hyperlipidemia)   Essential hypertension   Sick sinus syndrome (HCC)   Pacemaker-[employer]   Complete heart block (HCC)   DM (diabetes mellitus), type 2 with renal complications (HCC)   CAD (coronary artery disease)   CKD (chronic kidney disease) stage 3, GFR 30-59 ml/min   Hypertension   Hyperlipidemia   Coronary artery disease   CVD (cardiovascular disease)   Peripheral vascular disease (HCC)   BPH (benign prostatic hyperplasia)   CHF exacerbation (HCC)   Acute encephalopathy   Hypoglycemia   UTI (urinary tract infection)  HCAP ? SOB, acute hypoxic respiratory failure, 02 89% on room air , required 5liter oxygen supplement initial on presentation, :  Patient's cough and shortness of breath though likely caused by HCAP as shown by CXR of right retro-diaphragmatic opacity.  However Ct chest did not show evidence of pneumonia, but did reveal "Chronic small pleural effusion/collection at the left lung base with associated wall calcifications, the source of the chronic retro-diaphragmatic opacity described on chest x-ray of 03/02/2015."  - Urine legionella/ S. pneumococcal antigen are negative - blood culture x2 no growth, sputum culture not able to obtain. cough resolved at discharge, on room air. -He was treated with IV Vancomycin and cefepime from admission, vanc d/ed on 2/11 (mrsa screen negative), cefepime d/ced on 2/13 after ct chest did not reveal pna, change to oral doxycyclin to cover for possible aspiration pna and bronchitis. Continue nebs/mucinex, now able to wean off oxygen at  rest. -His CHF exacerbation may also contributed to his shortness of breath. Though ct chest did not review significant pulmonary edema either. but patient did have significant cough and wheezing initial of presentation, these symptom has resolved at discharge. 2/13 Rn report patient does has intermittent hypoxia with exertion, patient does has risk factor with recent right BKA and decreased mobility at baseline. , not able to get CTA due to cr elevation. VQ scan on 2/13, low probability for PE. 2/14 patient now is on room air at rest. No cough, lung clears.  Sepsis? Presented initially on admission: with hypotension that responded to stress dose steroids/abx. Possible source bronchitis (he did have significant cough and wheezing on presentation) and uti.  - 2/11patient also received a few dose of stress dose steroids, bp better, d/c steroids, d/c ivf. Continue abx.  2/12 has to be put back on stress dose steroids due to borderline bp, persistent congested cough, add nebs, CT chest no acute findings. 2/13 bp better, d/c stress dose steroids. 2/14: bp stable, restart home bp meds at lower dose with holding parameters.  CHF exacerbation?:  Patient has worsening shortness breath, his proBNP is elevated, indicating CHF exacerbation. Due to soft blood pressure and possible sepsis, continue to hold off diuretics. -Continue aspirin -Troponin 3, mild and flat -2-D echo lvef 40-45%, grade 1 diastolic dysfunction -BNP 300 -current seems euvolemic, with poor oral intake will not give lasix discharge to [facility] with lasix prn due to prone to have dehydration.  Hypoglycemia:  Blood sugar was 23. This is likely due to decreased oral intake and continuation of glipizide. -Hold glipizide. S/p d5-1/2 NS at 50 cc/h  -resolved. D/c  ivf D/c oral meds, change to ssi sliding scale, continue hypoglycemia precaution.  Acute encephalopathy:  Most likely due to hypoglycemia and possible sepsis. His mental  status comes back to baseline along with improvement of hypoglycemia. No focal neurologic findings on physical examination. -better  DM-II:  Last A1c 7.5, not well controled. Patient is taking glipizide at home. Now presents with hypoglycemia -Hold glipizid as above -2/11blood sugar now elevated, start ssi. discharge to snf with ssi, and close monitor blood sugar to prevent hypoglycemia episode.  H/o HTN:  Blood pressure is soft. patient is not taking medications for home. -Monitor blood pressure closely -he required stress dose steroid for blood pressure support. -home bp meds resumed at decreased dose at discharge.  BPH (benign prostatic hyperplasia): with indwelling foley Patient was discharged to snf with foley from last hospitalization on 2/7 -continue flomax, need outpatient urology follow up for voiding trial.  CKD-III: stable. Baseline creatinine 2.7 on 02/26/15. His creatinine is 2.10. -Follow-up will BMP, renal dosing meds  CAD (coronary artery disease): no chest pain. -continue aspirin and when necessary nitroglycerin -Follow-up troponin 3, flat  Peripheral vascular disease (HCC): s/p of R BKA. -No acute issue   HLD: Last LDL was not on record. -Continue home medications: Zocor -ldl 50  UTI: +yeast -continue Diflucan to finish course. -received vanco and cefepime from admission, vanc d/ced on 2/11 -Ux no growth  Hyperkalemia on presentation, resolved with hydration.  Constipation: required enema with good result. Continue stool softener.  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      DVT ppx: SQ Heparin (Pt has CKD-III, sq heparin is better choice than sq Lovenox)    Code Status: DNR Family Communication: patient and son in room Disposition Plan: return to snf on  2/14  6pm Addendum: discharge cancelled,  patient was not arousable at the time [facility] transportation getting ready to transport patient back to SNF, patient's vital stable, blood sugar 140. Unclear why patient is not arousable, i have discussed with patient's son Molly Maduro, Molly Maduro reports that patient had a good lunch and went to sleep after lunch, but has not woke up since.  discussed cancel discharge, will get CT head (not able to get mri brain due to pacemaker), may need to repeat CT head in 24hrs if first CT head negative,  Discussed with son this could bea  terminal event which he understands, dicussed with son if patient does not improve, will likely need in house palliative care consult in am which he agrees.  Consultants:  none  Procedures:  V/Q scan  Antibiotics:  vanc/cefepime   Objective: BP 131/61 mmHg  Pulse 70  Temp(Src) 97.5 F (36.4 C) (Axillary)  Resp 20  Wt 78.064 kg (172 lb 1.6 oz)  SpO2 99%  Intake/Output Summary (Last 24 hours) at 03/07/15 1758 Last data filed at 03/07/15 1255  Gross per 24 hour  Intake    480 ml  Output   1001 ml  Net   -521 ml   Filed Weights   03/03/15 0345  Weight: 78.064 kg (172 lb 1.6 oz)    Exam:   General:  Frail, more alert, oriented x3  Cardiovascular: paced rhythm  Respiratory: improved lung exam,  no wheezing, no rales,  Abdomen: Soft/ND/NT, positive BS, foley in place  Musculoskeletal: s/p right bka, left upper extremity mild edema,  left heal with protective dressing.   Neuro: now more alert  Data Reviewed: Basic Metabolic Panel:  Recent Labs Lab 03/04/15 0233 03/04/15 1252 03/05/15 0332 03/06/15 0240 03/07/15 0855  NA 137 135 137 138 137  K 5.9* 5.2* 5.4* 4.7 4.7  CL 104 107 106 104 105  CO2 22 21* GLUCOSE 293* 285* 163* 149* 108*  BUN 32* 33* 35* 43* 46*  CREATININE 2.38* 2.47* 2.38* 2.15* 2.02*  CALCIUM 7.2* 7.1* 7.4* 7.6* 7.5*   Liver Function Tests:  Recent Labs Lab 03/03/15 0640  AST 35  ALT 21  ALKPHOS 70  BILITOT  <0.1*  PROT 5.3*  ALBUMIN 1.6*   No results for input(s): LIPASE, AMYLASE in the last 168 hours. No results for input(s): AMMONIA in the last 168 hours. CBC:  Recent Labs Lab 03/04/15 0233 03/05/15 0332 03/06/15 0240  WBC 7.2 7.6 7.8  HGB 8.5* 8.9* 8.6*  HCT 29.4* 29.8* 28.8*  MCV 89.1 86.9 85.7  PLT 228 282 271   Cardiac Enzymes:    Recent Labs Lab 03/03/15 0640 03/03/15 1115 03/03/15 1830  TROPONINI 0.12* 0.11* 0.07*   BNP (last 3 results)  Recent Labs  03/04/15 0233 03/05/15 0332  BNP 396.2* 398.3*    ProBNP (last 3 results) No results for input(s): PROBNP in the last 8760 hours.  CBG:  Recent Labs Lab 03/06/15 1806 03/06/15 2135 03/07/15 0628 03/07/15 1117 03/07/15 1617  GLUCAP 163* 115* 112* 111* 155*    Recent Results (from the past 240 hour(s))  Culture, blood (routine x 2) Call MD if unable to obtain prior to antibiotics being given     Status: None (Preliminary result)   Collection Time: 03/03/15  6:24 AM  Result Value Ref Range Status   Specimen Description BLOOD RIGHT HAND  Final   Special Requests BOTTLES DRAWN AEROBIC ONLY 4CC  Final   Culture NO GROWTH 4 DAYS  Final   Report Status PENDING  Incomplete  Urine culture     Status: None   Collection Time: 03/03/15  6:57 AM  Result Value Ref Range Status   Specimen Description URINE, CATHETERIZED  Final   Special Requests NONE  Final   Culture NO GROWTH 1 DAY  Final   Report Status 03/04/2015 FINAL  Final  MRSA PCR Screening     Status: None   Collection Time: 03/03/15  7:52 AM  Result Value Ref Range Status   MRSA by PCR NEGATIVE NEGATIVE Final    Comment:        The GeneXpert MRSA Assay (FDA approved for NASAL specimens only), is one component of a comprehensive MRSA colonization surveillance program. It is not intended to diagnose MRSA infection nor to guide or monitor treatment for MRSA infections.   Culture, blood (routine x 2) Call MD if unable to obtain prior to  antibiotics being given     Status: None (Preliminary result)   Collection Time: 03/03/15 11:18 AM  Result Value Ref Range Status   Specimen Description BLOOD BLOOD RIGHT HAND  Final   Special Requests IN PEDIATRIC BOTTLE 3CC  Final   Culture NO GROWTH 4 DAYS  Final   Report Status PENDING  Incomplete     Studies: No results found.  Scheduled Meds: . aspirin  81 mg Oral Daily  . bisacodyl  10 mg Rectal Q M,W,F  . doxycycline  100 mg Oral Q12H  . feeding supplement  1 Container Oral BID BM  . feeding supplement (  PRO-STAT SUGAR FREE 64)  30 mL Oral BID  . guaiFENesin  600 mg Oral BID  . heparin  5,000 Units Subcutaneous 3 times per day  . insulin aspart  0-15 Units Subcutaneous TID WC  . ipratropium-albuterol  3 mL Nebulization 3 times per day  . multivitamin with minerals  1 tablet Oral Daily  . nitroGLYCERIN  0.2 mg Transdermal Daily  . pentoxifylline  400 mg Oral TID WC  . polyethylene glycol  17 g Oral Daily  . saccharomyces boulardii  250 mg Oral BID  . senna-docusate  2 tablet Oral BID  . simvastatin  40 mg Oral QPM  . tamsulosin  0.4 mg Oral QPM    Continuous Infusions:    Time spent:  Aryiana Klinkner MD, PhD  [facility] Pager [phone]. If 7PM-7AM, please contact night-coverage at [email or web address], password [email or web address] 03/07/2015, 5:58 PM  LOS: 4 days

## 2015-03-07 NOTE — Progress Notes (Signed)
Pt discharging to SNF today. Report called to [facility] center at 4:20 pm. Pt awaiting transport via [facility] for discharge.

## 2015-03-07 NOTE — Care Management Note (Signed)
Case Management Note  Patient Details  Name: [name] MRN: [id number] Date of Birth: [birth year]  Subjective/Objective:         Admitted with pneumonia           Action/Plan: Patient from [facility], PT recommending return to SNF. Referral made to CSW, CSW working on discharge back to facility.  Expected Discharge Date:                  Expected Discharge Plan:  Skilled Nursing Facility  In-House Referral:  Clinical Social Work  Discharge planning Services  CM Consult  Post Acute Care Choice:  NA Choice offered to:     DME Arranged:  N/A DME Agency:  NA  HH Arranged:  NA HH Agency:  NA  Status of Service:  In process, will continue to follow  Medicare Important Message Given:  Yes Date Medicare IM Given:    Medicare IM give by:    Date Additional Medicare IM Given:    Additional Medicare Important Message give by:     If discussed at Long Length of Stay Meetings, dates discussed:    Additional Comments:  Monica Becton, RN 03/07/2015, 11:35 AM

## 2015-03-07 NOTE — Clinical Social Work Placement (Signed)
   CLINICAL SOCIAL WORK PLACEMENT  NOTE  Date:  03/07/2015  Patient Details  Name: DEMETRE MONACO MRN: [id number] Date of Birth: March 29, 1920  Clinical Social Work is seeking post-discharge placement for this patient at the Skilled  Nursing Facility level of care (*CSW will initial, date and re-position this form in  chart as items are completed):  Yes   Patient/family provided with [facility] Clinical Social Work Department's list of facilities offering this level of care within the geographic area requested by the patient (or if unable, by the patient's family).  Yes   Patient/family informed of their freedom to choose among providers that offer the needed level of care, that participate in Medicare, Medicaid or managed care program needed by the patient, have an available bed and are willing to accept the patient.  Yes   Patient/family informed of [facility]'s ownership interest in [facility] and [facility], as well as of the fact that they are under no obligation to receive care at these facilities.  PASRR submitted to EDS on       PASRR number received on       Existing PASRR number confirmed on       FL2 transmitted to all facilities in geographic area requested by pt/family on       FL2 transmitted to all facilities within larger geographic area on       Patient informed that his/her managed care company has contracts with or will negotiate with certain facilities, including the following:            Patient/family informed of bed offers received.  Patient chooses bed at       Physician recommends and patient chooses bed at      Patient to be transferred to   on  .  Patient to be transferred to facility by       Patient family notified on   of transfer.  Name of family member notified:        PHYSICIAN Please sign DNR, Please sign FL2     Additional Comment:    _______________________________________________ Orson Gear, Student-SW 03/07/2015,  10:00 AM

## 2015-03-07 NOTE — Clinical Social Work Note (Signed)
Clinical Social Work Assessment  Patient Details  Name: [name] MRN: [id number] Date of Birth: [birth year]  Date of referral:  03/07/15               Reason for consult:  Discharge Planning                Permission sought to share information with:  Family Supports Permission granted to share information::  Yes, Verbal Permission Granted  Name::      Reche Dixon)  Agency::   (SNF)  Relationship::   (daughter- [name])  Contact Information:   [phone])  Housing/Transportation Living arrangements for the past 2 months:  Skilled Nursing Facility [facility]) Source of Information:  Adult Children Patient Interpreter Needed:  None Criminal Activity/Legal Involvement Pertinent to Current Situation/Hospitalization:  No - Comment as needed Significant Relationships:  Adult Children Lives with:  Facility Resident Do you feel safe going back to the place where you live?  No Need for family participation in patient care:  Yes (Comment)  Care giving concerns:   Patient's family has not expressed any care giving concerns at this time.   Social Worker assessment / plan:   BSW intern has spoken with patient's daughter, [name] by phone to discuss discharge planning. BSW intern explained SNF process with patient's daughter. Patient's daughter has informed BSW intern that patient is a long term resident at [facility] in [place]. Patient has been at this facility for about 3 months. Patient to d/c back to facility once medically stable. BSW intern to arrange transportation via [facility] for patient, per daughter's request. BSW intern to fax patient's clinical to facility and make staff aware that patient to return once stable. BSW intern remains available if any further Social Work needs arises.  Employment status:  Retired Health and safety inspector:  [employer] PT Recommendations:  Skilled Nursing Facility Information / Referral to community resources:  Skilled Nursing  Facility  Patient/Family's Response to care:   Patient's daughter appreciated Social Work intervention from [employer].  Patient/Family's Understanding of and Emotional Response to Diagnosis, Current Treatment, and Prognosis:   Patient's daughter understood need for further medical care and rehab at [facility].   Emotional Assessment Appearance:   (unable to assess) Attitude/Demeanor/Rapport:  Unable to Assess Affect (typically observed):  Unable to Assess Orientation:  Oriented to Self, Oriented to Place, Oriented to Situation Alcohol / Substance use:  Not Applicable Psych involvement (Current and /or in the community):  No (Comment)  Discharge Needs  Concerns to be addressed:  No discharge needs identified Readmission within the last 30 days:  No Current discharge risk:  None Barriers to Discharge:  No Barriers Identified   Orson Gear, Student-SW 03/07/2015, 10:09 AM

## 2015-03-07 NOTE — Clinical Social Work Note (Signed)
BSW intern has arranged transport for patient via [facility]. Patient's family has been made aware of patient's d/c as well as patient's bedside nurse.   BSW intern is signing off. If any further Social Work needs arises, please re-consult.  Kyla Balzarine Jonasia Coiner-BSW Intern  [phone]

## 2015-03-07 NOTE — Progress Notes (Signed)
Nutrition Follow-up  DOCUMENTATION CODES:   Not applicable  INTERVENTION:  Continue 30 ml Prostat po BID, each supplement provides 100 kcal and 15 grams of protein.   Provide Boost Breeze po BID, each supplement provides 250 kcal and 9 grams of protein.  Encourage adequate PO intake.   NUTRITION DIAGNOSIS:   Increased nutrient needs related to wound healing as evidenced by estimated needs; ongoing  GOAL:   Patient will meet greater than or equal to 90% of their needs; progressing  MONITOR:   PO intake, Supplement acceptance, Labs, Skin  REASON FOR ASSESSMENT:   Consult Assessment of nutrition requirement/status  ASSESSMENT:   82 y/o male PMH of HTN, HLD, DM, CAD, S/P of CABG, sick sinus syndrome, third-degree AV block, s/p of pacemaker placement, BPH, CHF, PVD (S/p of right BKA), CKD III, presents with AMS, hypoglycemia and SOB. Worked up for HCAP, CHF exacerbation.  Meal completion has been varied from 30-100%. Noted, pt with multiple wounds. Pt currently has Prostat ordered and has been consuming them. RD to additionally order Boost Breeze, as pt does not like "milky products", to aid in caloric and protein needs.   Labs and medications reviewed.  Diet Order:  DIET DYS 3 Room service appropriate?: Yes; Fluid consistency:: Thin  Skin:  Wound (see comment) (Unstageable pressure ulcer L heel, stage II on sacrum)  Last BM:  2/13  Height:   Ht Readings from Last 1 Encounters:  02/28/15  (1.778 m)    Weight:   Wt Readings from Last 1 Encounters:  03/03/15 172 lb 1.6 oz (78.064 kg)    Ideal Body Weight:  70.55 kg  BMI:  Body mass index is 24.69 kg/(m^2).  Estimated Nutritional Needs:   Kcal:  1900-2050  Protein:  95-105 grams  Fluid:  >/=1.9 L/day  EDUCATION NEEDS:   No education needs identified at this time  Roslyn Smiling, MS, RD, LDN Pager # [phone] After hours/ weekend pager # [phone]

## 2015-03-07 NOTE — Discharge Summary (Addendum)
Discharge Summary  [name] ZOX:[id number] DOB: [birth year]  PCP: Lindwood Qua, MD  Admit date: 03/03/2015 Discharge date: 03/07/2015  Time spent: >58mins  Recommendations for Outpatient Follow-up:  1. F/u with PMD at SNF in [place], family agree to palliative care consult at [facility] for possible transition to hospice. 2. F/u with urology this week for voiding trial and indwelling foley management. 3. F/u with orthopedic Dr. Lajoyce Corners for staple removal and wound  Check , s/p right BKA  Discharge Diagnoses:  Active Hospital Problems   Diagnosis Date Noted  . HCAP (healthcare-associated pneumonia) 03/03/2015  . Acute encephalopathy 03/03/2015  . Hypoglycemia 03/03/2015  . UTI (urinary tract infection) 03/03/2015  . Hypertension   . Hyperlipidemia   . Coronary artery disease   . CVD (cardiovascular disease)   . Peripheral vascular disease (HCC)   . BPH (benign prostatic hyperplasia)   . CHF exacerbation (HCC)   . DM (diabetes mellitus), type 2 with renal complications (HCC) 12/20/2014  . CAD (coronary artery disease) 12/20/2014  . CKD (chronic kidney disease) stage 3, GFR 30-59 ml/min 12/20/2014  . Complete heart block (HCC) 12/10/2012  . Pacemaker-Medtronic 11/20/2011  . Diabetic peripheral neuropathy (HCC) 04/15/2008  . HLD (hyperlipidemia) 04/15/2008  . Essential hypertension 04/15/2008  . Sick sinus syndrome (HCC) 04/15/2008    Resolved Hospital Problems   Diagnosis Date Noted Date Resolved  No resolved problems to display.    Discharge Condition: stable  Diet recommendation: heart healthy/carb modified, patient need to sit upright to eat, strict aspiration precaution Per swallow eval: Dysphagia 3 (Mech soft);Thin liquid   Liquid Administration via: Cup;Straw Medication Administration: Whole meds with puree Supervision: Patient able to self feed;Full supervision/cueing for compensatory strategies Compensations: Minimize environmental distractions;Slow rate;Small  sips/bites Postural Changes: Seated upright at 90 degrees   Filed Weights   03/03/15 0345  Weight: 78.064 kg (172 lb 1.6 oz)    History of present illness:  [name] is a 82 y.o. male with PMH of hypertension, hyperlipidemia, diabetes mellitus, CAD, S/P of CABG, sick sinus syndrome, third-degree AV block, s/p of pacemaker placement, BPH, congestive heart failure, PVD (S/p of right BKA), chronic kidney disease-stage III, presents with altered mental status, hypoglycemia and shortness of breath.   Pt is transferred from [facility] per Dr. Ilsa Iha.  Patient was recently admitted to and discharged from our service from 2/3-02/28/15 because of acute on chronic kidney injury and UTI due to yeast. He was treated and discharged on Diflucan. Per report, patient became unresponsive since this morning. He was also found to have shortness of breath and hypoglycemia with blood sugar level 23 in ED. He was treated with D50, 25 mL x 2 with improvement of mental status. Patient has shortness of breath and dry cough. ABG showed pH 7.26, PO2 153, PCO2 62. Chest x-ray showed right retro-diaphragmatic opacity in Ed of [facility]. When pt arrival to the floor of [place]. Mental status comes back to the baseline. He is orientated 3. He answered all questions appropriately. He reports that he has cough and mild shortness of breath, but no chest pain. He denies abdominal pain, diarrhea, symptoms of UTI. He moves all extremities (he has right BKA).  In ED of [facility], patient was found to have blood pressure 146/63, respiration rate 18, heart rate 101, WBC 9.7, temperature 96.1, stable renal function, INR 1.1, PTT 28.7, troponin 0.06-->0.08 which is gray zone in that hospital, pro-BNP 5720, positive urinalysis with 1+ leukocytes, stable renal function. Patient is  admitted to inpatient for further eval and treatment.  Hospital Course:  Principal Problem:   HCAP (healthcare-associated  pneumonia) Active Problems:   Diabetic peripheral neuropathy (HCC)   HLD (hyperlipidemia)   Essential hypertension   Sick sinus syndrome (HCC)   Pacemaker-Medtronic   Complete heart block (HCC)   DM (diabetes mellitus), type 2 with renal complications (HCC)   CAD (coronary artery disease)   CKD (chronic kidney disease) stage 3, GFR 30-59 ml/min   Hypertension   Hyperlipidemia   Coronary artery disease   CVD (cardiovascular disease)   Peripheral vascular disease (HCC)   BPH (benign prostatic hyperplasia)   CHF exacerbation (HCC)   Acute encephalopathy   Hypoglycemia   UTI (urinary tract infection)  HCAP ? SOB, acute hypoxic respiratory failure, 02 89% on room air , required 5liter oxygen supplement initial on presentation, :  Patient's cough and shortness of breath though likely caused by HCAP as shown by CXR of right retro-diaphragmatic opacity.  However Ct chest did not show evidence of pneumonia, but did reveal "Chronic small pleural effusion/collection at the left lung base with associated wall calcifications, the source of the chronic retro-diaphragmatic opacity described on chest x-ray of 03/02/2015."  - Urine legionella/ S. pneumococcal antigen are negative - blood culture x2 no growth, sputum culture not able to obtain. cough resolved at discharge, on room air. -He was treated with IV Vancomycin and cefepime from admission, vanc d/ed on 2/11 (mrsa screen negative), cefepime d/ced on 2/13 after ct chest did not reveal pna, change to oral doxycyclin to cover for possible aspiration pna and bronchitis. Continue nebs/mucinex, now able to wean off oxygen at rest. -His CHF exacerbation may also contributed to his shortness of breath. Though ct chest did not review significant pulmonary edema either. but patient did have significant cough and wheezing initial of presentation, these symptom has resolved at discharge. 2/13 Rn report patient does has intermittent hypoxia with  exertion,  patient does has risk factor with recent right BKA and decreased mobility at baseline. , not able to get CTA due to cr elevation. VQ scan on 2/13, low probability for PE. 2/14 patient now is on room air at rest. No cough, lung clears.  Sepsis? Presented initially on admission: with hypotension that responded to stress dose steroids/abx. Possible source bronchitis (he did have significant cough and wheezing on presentation) and uti.  - 2/11patient also received a few dose of stress dose steroids, bp better, d/c steroids, d/c ivf. Continue abx.  2/12 has to be put back on stress dose steroids due to borderline bp, persistent congested cough, add nebs, CT chest no acute findings. 2/13 bp better, d/c stress dose steroids. 2/14: bp stable, restart home bp meds at lower dose with holding parameters.  CHF exacerbation?:  Patient has worsening shortness breath, his proBNP is elevated, indicating CHF exacerbation. Due to soft blood pressure and possible sepsis, continue to hold off diuretics. -Continue aspirin -Troponin 3, mild and flat -2-D echo lvef 40-45%, grade 1 diastolic dysfunction -BNP 300 -current seems euvolemic, with poor oral intake will not give lasix discharge to [facility] with  lasix prn due to prone to have dehydration.  Hypoglycemia:  Blood sugar was 23. This is likely due to decreased oral intake and continuation of glipizide. -Hold glipizide. S/p d5-1/2 NS at 50 cc/h  -resolved. D/c ivf D/c oral meds, change to ssi sliding scale, continue hypoglycemia precaution.  Acute encephalopathy:  Most likely due to hypoglycemia and possible sepsis. His mental status comes  back to baseline along with improvement of hypoglycemia. No focal neurologic findings on physical examination. -better  DM-II:  Last A1c 7.5, not well controled. Patient is taking glipizide at home. Now presents with hypoglycemia -Hold glipizid as above -2/11blood sugar now elevated, start  ssi. discharge to snf with ssi, and close monitor blood sugar to prevent hypoglycemia episode.  H/o HTN:  Blood pressure is soft. patient is not taking medications for home. -Monitor blood pressure closely -he required stress dose steroid for blood pressure support. -home bp meds resumed at decreased dose at discharge.  BPH (benign prostatic hyperplasia): with indwelling foley Patient was discharged to snf with foley from last hospitalization on 2/7 -continue flomax, need outpatient urology follow up for voiding trial.  CKD-III: stable. Baseline creatinine 2.7 on 02/26/15. His creatinine is 2.10. -Follow-up will BMP, renal dosing meds  CAD (coronary artery disease): no chest pain. -continue aspirin and when necessary nitroglycerin -Follow-up troponin 3, flat  Peripheral vascular disease (HCC): s/p of R BKA. -No acute issue   HLD: Last LDL was not on record. -Continue home medications: Zocor -ldl 50  UTI: +yeast -continue Diflucan to finish course. -received vanco and cefepime from admission, vanc d/ced on 2/11 -Ux no growth  Hyperkalemia on presentation, resolved with hydration.  Constipation: required enema with good result. Continue stool softener.  S/p right BKA on 1/20, wound care consulted, will need to f/u with Dr Lajoyce Corners at discharge   FTT; family agrees to palliative team consult at [facility] for possible transition to hospice.    Code Status: DNR Family Communication: patient and son in room Disposition Plan: return to snf at discharge on 2/14   Consultants:  none  Procedures:  V/Q scan, low probability  Antibiotics:  Vanc/cefepime from admission to 2/13,   Doxycycline from 2/13  Diflucan from admission   Discharge Exam: BP 131/61 mmHg  Pulse 70  Temp(Src) 97.5 F (36.4 C) (Axillary)  Resp 20  Wt 78.064 kg (172 lb 1.6 oz)  SpO2 99%  General: * Cardiovascular: * Respiratory: *  Discharge Instructions You were cared for by a hospitalist  during your hospital stay. If you have any questions about your discharge medications or the care you received while you were in the hospital after you are discharged, you can call the unit and asked to speak with the hospitalist on call if the hospitalist that took care of you is not available. Once you are discharged, your primary care physician will handle any further medical issues. Please note that NO REFILLS for any discharge medications will be authorized once you are discharged, as it is imperative that you return to your primary care physician (or establish a relationship with a primary care physician if you do not have one) for your aftercare needs so that they can reassess your need for medications and monitor your lab values.      Discharge Instructions    Diet - low sodium heart healthy    Complete by:  As directed      Increase activity slowly    Complete by:  As directed             Medication List    STOP taking these medications        docusate sodium 100 MG capsule  Commonly known as:  COLACE     glipiZIDE 5 MG tablet  Commonly known as:  GLUCOTROL      TAKE these medications        acetaminophen 325  MG tablet  Commonly known as:  TYLENOL  Take 2 tablets (650 mg total) by mouth every 6 (six) hours as needed for mild pain (or Fever >/= 101).     aspirin 81 MG tablet  Take 81 mg by mouth daily.     bisacodyl 10 MG suppository  Commonly known as:  DULCOLAX  Place 10 mg rectally as needed for moderate constipation.     collagenase ointment  Commonly known as:  SANTYL  Apply 1 application topically daily.     doxycycline 100 MG tablet  Commonly known as:  VIBRA-TABS  Take 1 tablet (100 mg total) by mouth every 12 (twelve) hours.     feeding supplement (PRO-STAT SUGAR FREE 64) Liqd  Take 30 mLs by mouth 2 (two) times daily.     feeding supplement Liqd  Take 1 Container by mouth 2 (two) times daily between meals.     fluconazole 100 MG tablet  Commonly  known as:  DIFLUCAN  Take 1 tablet (100 mg total) by mouth daily.     furosemide 40 MG tablet  Commonly known as:  LASIX  Take 1 tablet (40 mg total) by mouth daily as needed for fluid or edema.     guaiFENesin 600 MG 12 hr tablet  Commonly known as:  MUCINEX  Take 600 mg by mouth 2 (two) times daily.     HYDROcodone-acetaminophen 5-325 MG tablet  Commonly known as:  NORCO/VICODIN  Take 1 tablet by mouth every 6 (six) hours as needed for moderate pain.     insulin aspart 100 UNIT/ML injection  Commonly known as:  NOVOLOG  Insulin sliding scale: (TID with meals, please check blood sugar TID with meals) Blood sugar  120-150   3units                       151-200   4units                       201-250   7units                       251- 300  11units                       301-350   15uints                       351-400   20units                       >400         call MD immediately     MENS MULTIVITAMIN PLUS PO  Take 1 tablet by mouth daily.     methocarbamol 500 MG tablet  Commonly known as:  ROBAXIN  Take 1 tablet (500 mg total) by mouth every 8 (eight) hours as needed for muscle spasms.     metoprolol tartrate 25 MG tablet  Commonly known as:  LOPRESSOR  Take 0.5 tablets (12.5 mg total) by mouth 2 (two) times daily. Hold if sbp less than     nitroGLYCERIN 0.2 mg/hr patch  Commonly known as:  NITRODUR - Dosed in mg/24 hr  Place 0.2 mg onto the skin daily.     ondansetron 4 MG tablet  Commonly known as:  ZOFRAN  Take 4 mg by mouth every 6 (six) hours as needed for nausea  or vomiting.     pentoxifylline 400 MG CR tablet  Commonly known as:  TRENTAL  Take 400 mg by mouth 3 (three) times daily with meals.     PHILLIPS COLON HEALTH PO  Take 1 capsule by mouth daily.     senna-docusate 8.6-50 MG tablet  Commonly known as:  Senokot-S  Take 2 tablets by mouth at bedtime.     simvastatin 40 MG tablet  Commonly known as:  ZOCOR  Take 40 mg by mouth every evening.      tamsulosin 0.4 MG Caps capsule  Commonly known as:  FLOMAX  Take 0.4 mg by mouth every evening.       Allergies  Allergen Reactions  . Ace Inhibitors     Kidney failure  . Ciprofloxacin Rash   Follow-up Information    Follow up with Nadara Mustard, MD.   Specialty:  Orthopedic Surgery   Why:  s/p right bka on 1/20   Contact information:   [address] [place] [address] [phone]       Follow up with [facility] .   Specialty:  Skilled Nursing Facility   Contact information:   [address] [place] [address] [phone]       The results of significant diagnostics from this hospitalization (including imaging, microbiology, ancillary and laboratory) are listed below for reference.    Significant Diagnostic Studies: Dg Chest 2 View  03/06/2015  CLINICAL DATA:  Persistent cough, CHF, heart disease EXAM: CHEST  2 VIEW COMPARISON:  03/02/2015 FINDINGS: Low lung volumes evident with cardiomegaly and vascular congestion. Basilar atelectasis present. Pacemakers present bilaterally. Prior coronary bypass changes. Aortic is atherosclerotic. No enlarging effusion or pneumothorax. No significant interval change. IMPRESSION: Stable cardiomegaly with vascular congestion, low lung volumes, and basilar atelectasis. Electronically Signed   By: Judie Petit.  Shick M.D.   On: 03/06/2015 19:25   Dg Chest 2 View  02/24/2015  CLINICAL DATA:  Cough, pneumonia EXAM: CHEST  2 VIEW COMPARISON:  05/12/2014 FINDINGS: Cardiomediastinal silhouette is stable. Bilateral dual lead cardiac pacemaker with leads in right atrium and right ventricle is unchanged in position. Status post CABG. No acute infiltrate or pulmonary edema. Stable pleural thickening and scarring left base. Osteopenia and mild degenerative changes thoracic spine. IMPRESSION: No active disease. Stable chronic changes left base. Status post median sternotomy. Electronically Signed   By: Natasha Mead M.D.    On: 02/24/2015 13:02   Ct Chest Wo Contrast  03/05/2015  CLINICAL DATA:  Pneumonia.  Opacity on chest x-ray. History of CHF, diabetes, chronic renal disease. EXAM: CT CHEST WITHOUT CONTRAST TECHNIQUE: Multidetector CT imaging of the chest was performed following the standard protocol without IV contrast. COMPARISON:  Chest x-rays dated 03/02/2015 and 02/24/2015. FINDINGS: Mediastinum/Lymph Nodes: Prominent atherosclerotic changes of the normal- caliber thoracic aorta. Heart size is upper normal. No pericardial effusion. Diffuse coronary artery calcifications. Patient is status post median sternotomy for CABG. No masses or enlarged lymph nodes within the mediastinum or perihilar regions. Trachea and central bronchi are unremarkable. Lungs/Pleura: Chronic small effusion at the left lung base, with associated wall calcifications. Subtle nodular density within the right middle lobe, pleural based, measuring 8 mm. Lungs otherwise clear. Upper abdomen: Cholelithiasis without evidence of acute cholecystitis. Limited images of the upper abdomen are otherwise unremarkable. Musculoskeletal: No chest wall mass or suspicious bone lesions identified. Degenerative changes throughout the kyphotic thoracic spine, mild to moderate in degree. No evidence of a significant central canal  stenosis at any level. IMPRESSION: 1. No evidence of pneumonia. Chronic small pleural effusion/collection at the left lung base with associated wall calcifications, the source of the chronic retro-diaphragmatic opacity described on chest x-ray of 03/02/2015. 2. Subtle 8 mm nodular density within the right middle lobe, pleural based. Suspect nodular atelectasis or scarring. Neoplastic nodule also possible. If the patient is at high risk for bronchogenic carcinoma, follow-up chest CT at 3-59months is recommended. If the patient is at low risk for bronchogenic carcinoma, follow-up chest CT at 6-12 months is recommended. This recommendation follows the  consensus statement: Guidelines for Management of Small Pulmonary Nodules Detected on CT Scans: A Statement from the Fleischner Society as published in Radiology 2005; 237:395-400. 3. Prominent atherosclerotic changes as above. Patient is status post median sternotomy for CABG. 4. Overall, no acute findings. Electronically Signed   By: Bary Richard M.D.   On: 03/05/2015 14:47   US Renal  03/05/2015  CLINICAL DATA:  Elevated creatinine EXAM: RENAL / URINARY TRACT ULTRASOUND COMPLETE COMPARISON:  None. FINDINGS: Right Kidney: Length: 10.1 cm.  Cortical thinning.  No mass or hydronephrosis. Left Kidney: Length: 11.4 cm.  Cortical thinning.  No mass or hydronephrosis. Bladder: Decompressed by indwelling Foley catheter. IMPRESSION: Cortical thinning, suggesting medical renal disease. No hydronephrosis. Bladder decompressed by indwelling Foley catheter. Electronically Signed   By: Charline Bills M.D.   On: 03/05/2015 12:07   Nm Pulmonary Perf And Vent  03/06/2015  CLINICAL DATA:  Cough shortness of breath EXAM: NUCLEAR MEDICINE VENTILATION - PERFUSION LUNG SCAN TECHNIQUE: Ventilation images were obtained in multiple projections using inhaled aerosol Tc-73m DTPA. Perfusion images were obtained in multiple projections after intravenous injection of Tc-10m MAA. RADIOPHARMACEUTICALS:  30.0 Technetium-68m DTPA aerosol inhalation and 4.1 Technetium-35m MAA IV COMPARISON:  Chest radiograph 02/24/2015 and 03/06/2015 FINDINGS: Ventilation: No focal ventilation defect. Perfusion: No wedge shaped peripheral perfusion defects to suggest acute pulmonary embolism. Lateral images not performed. Photopenic defect corresponding to bilateral cardiac pacer generator is noted. IMPRESSION: No findings to suggest pulmonary embolism. Electronically Signed   By: Esperanza Heir M.D.   On: 03/06/2015 17:40   Dg Shoulder Right Port  03/03/2015  CLINICAL DATA:  Right acromioclavicular joint pain, no known injury, initial encounter  EXAM: PORTABLE RIGHT SHOULDER - 2+ VIEW COMPARISON:  None. FINDINGS: Degenerative changes of the acromioclavicular joint are seen. No fracture or dislocation is noted. No gross soft tissue abnormality is noted. IMPRESSION: Acromioclavicular joint degenerative change without acute abnormality. Electronically Signed   By: Alcide Clever M.D.   On: 03/03/2015 12:38   Dg Swallowing Func-speech Pathology  02/26/2015  Objective Swallowing Evaluation: Type of Study: MBS-Modified Barium Swallow Study Patient Details Name: KOLBIE LEPKOWSKI MRN: [id number] Date of Birth: November 16, 1920 Today's Date: 02/26/2015 Time: SLP Start Time (ACUTE ONLY): 0935-SLP Stop Time (ACUTE ONLY): 0958 SLP Time Calculation (min) (ACUTE ONLY): 23 min Past Medical History: Past Medical History Diagnosis Date . Hypertension  . Hyperlipidemia  . Diabetes mellitus  . Coronary artery disease    s/p CABG 1996 . Sick sinus syndrome (HCC)    s/p PPM 2001 (R sided),  RV lead fracture 10/12 with new system (MDT) placed on L side due to R sided venous occlusion . Diabetic neuropathy (HCC)  . CVD (cardiovascular disease)    s/p CEA . Peripheral vascular disease (HCC)  . Chronic kidney disease  . BPH (benign prostatic hyperplasia)  . Hypercholesteremia  . Myocardial infarction (HCC)  . CHF (congestive heart failure) (HCC)  .  Ulcer of toe (HCC)  . Ulcer of heel and midfoot (HCC)  . Presence of permanent cardiac pacemaker  Past Surgical History: Past Surgical History Procedure Laterality Date . Coronary artery bypass graft   . Carotid endarterectomy   . Pacemaker insertion     s/p PPM 2001 (R sided),  RV lead fracture 10/12 with new system (MDT) placed on Left side due to R sided venous occlusion . Hernia repair   . Cataract extraction w/ intraocular lens implant Bilateral  . Insert / replace / remove pacemaker   . Wound debridement Left 07/28/2014   Procedure: DEBRIDEMENT WOUND/LEFT HEEL DEBRIDMENT;  Surgeon: Annice Needy, MD;  Location: [facility] ORS;  Service: Vascular;   Laterality: Left; . Application of wound vac Left 07/28/2014   Procedure: APPLICATION OF WOUND VAC;  Surgeon: Annice Needy, MD;  Location: [facility] ORS;  Service: Vascular;  Laterality: Left; . Amputation Right 12/21/2014   Procedure: AMPUTATION RAY/RIGHT;  Surgeon: Nadara Mustard, MD;  Location: [facility] OR;  Service: Orthopedics;  Laterality: Right; . Amputation Right 02/10/2015   Procedure: Right Below Knee Amputation;  Surgeon: Nadara Mustard, MD;  Location[facility] OR;  Service: Orthopedics;  Laterality: Right; HPI: 82 year old male with a history of right BKA, CK D, stage III, hypertension, diabetes, who presented to the emergency dept with complaints of hypoglycemia. CXR No active disease. Chart states report facility stated pt has had poor intake and "hasn't been acting his normal." No prior St notes Pt. states he has difficulty swallowing (could not give details). Subjective: The patient was seen in radiology for MBSS.  He was in distress due to inability to urinate.  Nurse was made aware of the trouble and planned to scan bladder when the patient returned to his room.  Assessment / Plan / Recommendation CHL IP CLINICAL IMPRESSIONS 02/26/2015 Therapy Diagnosis Mild oral phase dysphagia;Mild pharyngeal phase dysphagia Clinical Impression MBSS was completed.  The patient presented with mild oropharyngeal dysphagia characterized by mildly delayed oral transit and premature spill given dual textured solids and mildly delayed swallow trigger.  Material was in the vallecular prior to swallow initiation.  No penetration/aspiration was observed.  Of note, the patient had an intermittent cough during the MBSS that did not appear to be related to penetration/aspiration.  Esophgeal sweep revealed it mildly slow to clear.  ST will follow up for therapeutic diet tolerance.   Impact on safety and function Mild aspiration risk   CHL IP TREATMENT RECOMMENDATION 02/26/2015 Treatment Recommendations Therapy as outlined in treatment plan below    Prognosis 02/26/2015 Prognosis for Safe Diet Advancement Fair Barriers to Reach Goals -- Barriers/Prognosis Comment -- CHL IP DIET RECOMMENDATION 02/26/2015 SLP Diet Recommendations Dysphagia 3 (Mech soft) solids;Thin liquid Liquid Administration via Cup;Straw Medication Administration Whole meds with puree Compensations Slow rate;Small sips/bites;Follow solids with liquid Postural Changes Seated upright at 90 degrees   CHL IP OTHER RECOMMENDATIONS 02/26/2015 Recommended Consults -- Oral Care Recommendations Oral care BID Other Recommendations --   CHL IP FOLLOW UP RECOMMENDATIONS 02/25/2015 Follow up Recommendations (No Data)   CHL IP FREQUENCY AND DURATION 02/26/2015 Speech Therapy Frequency (ACUTE ONLY) min 2x/week Treatment Duration 2 weeks      CHL IP ORAL PHASE 02/26/2015 Oral Phase Impaired Oral - Pudding Teaspoon -- Oral - Pudding Cup -- Oral - Honey Teaspoon -- Oral - Honey Cup -- Oral - Nectar Teaspoon -- Oral - Nectar Cup -- Oral - Nectar Straw -- Oral - Thin Teaspoon -- Oral - Thin Cup --  Oral - Thin Straw -- Oral - Puree -- Oral - Mech Soft -- Oral - Regular -- Oral - Multi-Consistency Impaired mastication;Premature spillage Oral - Pill -- Oral Phase - Comment --  CHL IP PHARYNGEAL PHASE 02/26/2015 Pharyngeal Phase Impaired Pharyngeal- Pudding Teaspoon -- Pharyngeal -- Pharyngeal- Pudding Cup -- Pharyngeal -- Pharyngeal- Honey Teaspoon -- Pharyngeal -- Pharyngeal- Honey Cup -- Pharyngeal -- Pharyngeal- Nectar Teaspoon -- Pharyngeal -- Pharyngeal- Nectar Cup -- Pharyngeal -- Pharyngeal- Nectar Straw -- Pharyngeal -- Pharyngeal- Thin Teaspoon Delayed swallow initiation-vallecula Pharyngeal -- Pharyngeal- Thin Cup Delayed swallow initiation-vallecula Pharyngeal -- Pharyngeal- Thin Straw Delayed swallow initiation-vallecula Pharyngeal -- Pharyngeal- Puree -- Pharyngeal -- Pharyngeal- Mechanical Soft -- Pharyngeal -- Pharyngeal- Regular -- Pharyngeal -- Pharyngeal- Multi-consistency -- Pharyngeal -- Pharyngeal- Pill --  Pharyngeal -- Pharyngeal Comment --  CHL IP CERVICAL ESOPHAGEAL PHASE 02/26/2015 Cervical Esophageal Phase WFL Pudding Teaspoon -- Pudding Cup -- Honey Teaspoon -- Honey Cup -- Nectar Teaspoon -- Nectar Cup -- Nectar Straw -- Thin Teaspoon -- Thin Cup -- Thin Straw -- Puree -- Mechanical Soft -- Regular -- Multi-consistency -- Pill -- Cervical Esophageal Comment -- CHL IP GO 02/26/2015 Functional Assessment Tool Used ASHA NOMS and clinical judgment.   Functional Limitations Swallowing Swallow Current Status [phone]) CJ Swallow Goal Status (U0454) CJ Swallow Discharge Status [phone]) (None) Motor Speech Current Status [phone]) (None) Motor Speech Goal Status [phone]) (None) Motor Speech Goal Status [phone]) (None) Spoken Language Comprehension Current Status [phone]) (None) Spoken Language Comprehension Goal Status (N6295) (None) Spoken Language Comprehension Discharge Status [phone]) (None) Spoken Language Expression Current Status [phone]) (None) Spoken Language Expression Goal Status [phone]) (None) Spoken Language Expression Discharge Status [phone]) (None) Attention Current Status (I3474) (None) Attention Goal Status (Q5956) (None) Attention Discharge Status [phone]) (None) Memory Current Status (E3329) (None) Memory Goal Status (J1884) (None) Memory Discharge Status (Z6606) (None) Voice Current Status [phone]) (None) Voice Goal Status (F0932) (None) Voice Discharge Status [phone]) (None) Other Speech-Language Pathology Functional Limitation [phone]) (None) Other Speech-Language Pathology Functional Limitation Goal Status (K2706) (None) Other Speech-Language Pathology Functional Limitation Discharge Status [phone]) (None) Dimas Aguas, MA, CCC-SLP Acute Rehab SLP [phone] Fleet Contras 02/26/2015, 10:16 AM               Microbiology: Recent Results (from the past 240 hour(s))  Culture, blood (routine x 2) Call MD if unable to obtain prior to antibiotics being given     Status: None (Preliminary result)   Collection  Time: 03/03/15  6:24 AM  Result Value Ref Range Status   Specimen Description BLOOD RIGHT HAND  Final   Special Requests BOTTLES DRAWN AEROBIC ONLY 4CC  Final   Culture NO GROWTH 3 DAYS  Final   Report Status PENDING  Incomplete  Urine culture     Status: None   Collection Time: 03/03/15  6:57 AM  Result Value Ref Range Status   Specimen Description URINE, CATHETERIZED  Final   Special Requests NONE  Final   Culture NO GROWTH 1 DAY  Final   Report Status 03/04/2015 FINAL  Final  MRSA PCR Screening     Status: None   Collection Time: 03/03/15  7:52 AM  Result Value Ref Range Status   MRSA by PCR NEGATIVE NEGATIVE Final    Comment:        The GeneXpert MRSA Assay (FDA approved for NASAL specimens only), is one component of a comprehensive MRSA colonization surveillance program. It is not intended to diagnose MRSA infection nor to guide or monitor  treatment for MRSA infections.   Culture, blood (routine x 2) Call MD if unable to obtain prior to antibiotics being given     Status: None (Preliminary result)   Collection Time: 03/03/15 11:18 AM  Result Value Ref Range Status   Specimen Description BLOOD BLOOD RIGHT HAND  Final   Special Requests IN PEDIATRIC BOTTLE 3CC  Final   Culture NO GROWTH 3 DAYS  Final   Report Status PENDING  Incomplete     Labs: Basic Metabolic Panel:  Recent Labs Lab 03/04/15 0233 03/04/15 1252 03/05/15 0332 03/06/15 0240 03/07/15 0855  NA 137 135 137 138 137  K 5.9* 5.2* 5.4* 4.7 4.7  CL 104 107 106 104 105  CO2 22 21* 22 24 23   GLUCOSE 293* 285* 163* 149* 108*  BUN 32* 33* 35* 43* 46*  CREATININE 2.38* 2.47* 2.38* 2.15* 2.02*  CALCIUM 7.2* 7.1* 7.4* 7.6* 7.5*   Liver Function Tests:  Recent Labs Lab 03/03/15 0640  AST 35  ALT 21  ALKPHOS 70  BILITOT <0.1*  PROT 5.3*  ALBUMIN 1.6*   No results for input(s): LIPASE, AMYLASE in the last 168 hours. No results for input(s): AMMONIA in the last 168 hours. CBC:  Recent Labs Lab  03/04/15 0233 03/05/15 0332 03/06/15 0240  WBC 7.2 7.6 7.8  HGB 8.5* 8.9* 8.6*  HCT 29.4* 29.8* 28.8*  MCV 89.1 86.9 85.7  PLT 228 282 271   Cardiac Enzymes:  Recent Labs Lab 03/03/15 0640 03/03/15 1115 03/03/15 1830  TROPONINI 0.12* 0.11* 0.07*   BNP: BNP (last 3 results)  Recent Labs  03/04/15 0233 03/05/15 0332  BNP 396.2* 398.3*    ProBNP (last 3 results) No results for input(s): PROBNP in the last 8760 hours.  CBG:  Recent Labs Lab 03/06/15 1256 03/06/15 1806 03/06/15 2135 03/07/15 0628 03/07/15 1117  GLUCAP 165* 163* 115* 112* 111*       Signed:  Kaysen Sefcik MD, PhD  [facility] 03/07/2015, 3:01 PM

## 2015-03-07 NOTE — NC FL2 (Signed)
[place] MEDICAID FL2 LEVEL OF CARE SCREENING TOOL     IDENTIFICATION  Patient Name: [name] Birthdate: 1920-03-03 Sex: male Admission Date (Current Location): 03/03/2015  [place] and [place] Number:  [place] [id number] A Facility and Address:  [facility], [address] [place], [place] [address]      Provider Number: [id number]  Attending Physician Name and Address:  Albertine Grates, MD  Relative Name and Phone Number:   [name]: daughter- [phone])    Current Level of Care: Hospital Recommended Level of Care: Skilled Nursing Facility Prior Approval Number:    Date Approved/Denied:   PASRR Number:    Discharge Plan: SNF    Current Diagnoses: Patient Active Problem List   Diagnosis Date Noted  . HCAP (healthcare-associated pneumonia) 03/03/2015  . Acute encephalopathy 03/03/2015  . Hypoglycemia 03/03/2015  . UTI (urinary tract infection) 03/03/2015  . Hypertension   . Hyperlipidemia   . Coronary artery disease   . CVD (cardiovascular disease)   . Peripheral vascular disease (HCC)   . BPH (benign prostatic hyperplasia)   . CHF exacerbation (HCC)   . Acute renal failure superimposed on stage 3 chronic kidney disease (HCC) 02/24/2015  . Dehydration, severe 02/24/2015  . Foley catheter in place on admission 02/24/2015  . Abnormal urinalysis 02/24/2015  . Acute renal failure (ARF) (HCC) 02/24/2015  . Diabetic hypoglycemia (HCC) 02/24/2015  . Pressure ulcer 02/13/2015  . S/P BKA (below knee amputation) unilateral (HCC) 02/10/2015  . DM (diabetes mellitus), type 2 with renal complications (HCC) 12/20/2014  . CAD (coronary artery disease) 12/20/2014  . CKD (chronic kidney disease) stage 3, GFR 30-59 ml/min 12/20/2014  . Acute osteomyelitis of toe of right foot (HCC) 12/20/2014  . Diabetic ulcer of right foot associated with type 2 diabetes mellitus (HCC)   . Complete heart block (HCC) 12/10/2012  . Pacemaker-Medtronic 11/20/2011  .  DIZZINESS 11/01/2008  . EDEMA 11/01/2008  . Diabetic peripheral neuropathy (HCC) 04/15/2008  . HLD (hyperlipidemia) 04/15/2008  . Essential hypertension 04/15/2008  . Sick sinus syndrome (HCC) 04/15/2008    Orientation RESPIRATION BLADDER Height & Weight     Situation, Self, Place  Normal Indwelling catheter Weight: 172 lb 1.6 oz (78.064 kg) Height:     BEHAVIORAL SYMPTOMS/MOOD NEUROLOGICAL BOWEL NUTRITION STATUS      Continent Diet (DYS 3)  AMBULATORY STATUS COMMUNICATION OF NEEDS Skin   Extensive Assist   PU Stage and Appropriate Care                       Personal Care Assistance Level of Assistance              Functional Limitations Info             SPECIAL CARE FACTORS FREQUENCY  PT (By licensed PT)     PT Frequency:  (2x/week)              Contractures      Additional Factors Info  Isolation Precautions, Code Status, Allergies Code Status Info:  (DNR) Allergies Info:  (Ace Inhibitors, Ciprofloxacin)     Isolation Precautions Info:  (MRSA)     Current Medications (03/07/2015):  This is the current hospital active medication list Current Facility-Administered Medications  Medication Dose Route Frequency Provider Last Rate Last Dose  . acetaminophen (TYLENOL) suppository 650 mg  650 mg Rectal Q4H PRN Lorretta Harp, MD      . aspirin chewable tablet 81 mg  81 mg  Oral Daily Lorretta Harp, MD   81 mg at 03/07/15 0959  . bisacodyl (DULCOLAX) suppository 10 mg  10 mg Rectal Q M,W,F Albertine Grates, MD   10 mg at 03/06/15 1743  . dextrose 50 % solution 25 mL  25 mL Intravenous PRN Lorretta Harp, MD   25 mL at 03/03/15 0510  . doxycycline (VIBRA-TABS) tablet 100 mg  100 mg Oral Q12H Albertine Grates, MD   100 mg at 03/07/15 0958  . feeding supplement (PRO-STAT SUGAR FREE 64) liquid 30 mL  30 mL Oral BID Albertine Grates, MD   30 mL at 03/07/15 0959  . guaiFENesin (MUCINEX) 12 hr tablet 600 mg  600 mg Oral BID Lorretta Harp, MD   600 mg at 03/07/15 1000  . heparin injection 5,000 Units  5,000  Units Subcutaneous 3 times per day Lorretta Harp, MD   5,000 Units at 03/07/15 0640  . HYDROcodone-acetaminophen (NORCO/VICODIN) 5-325 MG per tablet 1 tablet  1 tablet Oral Q6H PRN Lorretta Harp, MD   1 tablet at 03/04/15 1411  . insulin aspart (novoLOG) injection 0-15 Units  0-15 Units Subcutaneous TID WC Albertine Grates, MD   3 Units at 03/06/15 1818  . ipratropium-albuterol (DUONEB) 0.5-2.5 (3) MG/3ML nebulizer solution 3 mL  3 mL Nebulization Q4H PRN Albertine Grates, MD      . ipratropium-albuterol (DUONEB) 0.5-2.5 (3) MG/3ML nebulizer solution 3 mL  3 mL Nebulization 3 times per day Albertine Grates, MD   3 mL at 03/07/15 0912  . methocarbamol (ROBAXIN) tablet 500 mg  500 mg Oral Q8H PRN Lorretta Harp, MD   500 mg at 03/05/15 1106  . multivitamin with minerals tablet 1 tablet  1 tablet Oral Daily Lorretta Harp, MD   1 tablet at 03/07/15 0959  . nitroGLYCERIN (NITRODUR - Dosed in mg/24 hr) patch 0.2 mg  0.2 mg Transdermal Daily Lorretta Harp, MD   0.2 mg at 03/07/15 0958  . pentoxifylline (TRENTAL) CR tablet 400 mg  400 mg Oral TID WC Lorretta Harp, MD   400 mg at 03/07/15 0640  . polyethylene glycol (MIRALAX / GLYCOLAX) packet 17 g  17 g Oral Daily Albertine Grates, MD   17 g at 03/07/15 0959  . saccharomyces boulardii (FLORASTOR) capsule 250 mg  250 mg Oral BID Lorretta Harp, MD   250 mg at 03/07/15 0958  . senna-docusate (Senokot-S) tablet 2 tablet  2 tablet Oral BID Albertine Grates, MD   2 tablet at 03/07/15 0959  . simvastatin (ZOCOR) tablet 40 mg  40 mg Oral QPM Lorretta Harp, MD   40 mg at 03/06/15 1743  . tamsulosin (FLOMAX) capsule 0.4 mg  0.4 mg Oral QPM Lorretta Harp, MD   0.4 mg at 03/06/15 1743  . technetium TC 50M diethylenetriame-pentaacetic acid (DTPA) injection 30 milli Curie  30 milli Curie Intravenous Once PRN Albertine Grates, MD         Discharge Medications: Please see discharge summary for a list of discharge medications.  Relevant Imaging Results:  Relevant Lab Results:   Additional Information  (SSN:[id number])  Orson Gear,  Student-SW [phone]

## 2015-03-08 ENCOUNTER — Encounter (HOSPITAL_COMMUNITY): Payer: Self-pay | Admitting: General Practice

## 2015-03-08 DIAGNOSIS — J189 Pneumonia, unspecified organism: Secondary | ICD-10-CM

## 2015-03-08 LAB — CULTURE, BLOOD (ROUTINE X 2)
CULTURE: NO GROWTH
Culture: NO GROWTH

## 2015-03-08 LAB — GLUCOSE, CAPILLARY
GLUCOSE-CAPILLARY: 122 mg/dL — AB (ref 65–99)
GLUCOSE-CAPILLARY: 129 mg/dL — AB (ref 65–99)
GLUCOSE-CAPILLARY: 125 mg/dL — AB (ref 65–99)
Glucose-Capillary: 177 mg/dL — ABNORMAL HIGH (ref 65–99)
Glucose-Capillary: 137 mg/dL — ABNORMAL HIGH (ref 65–99)

## 2015-03-08 NOTE — Progress Notes (Signed)
Patient ID: [name], male   DOB: [birth year], 82 y.o.   MRN: [id number] Patient is 3 weeks status post a right transtibial amputation. The incision looks great,  will have the nurses harvest the staples and apply a dry dressing plus Ace wrap. I will follow-up in the office in 2 weeks.

## 2015-03-08 NOTE — Progress Notes (Signed)
PROGRESS NOTE  [name]:[id number] DOB: 1920/04/25 DOA: 03/03/2015 PCP: Lindwood Qua, MD  82 year old male Initially being managed by wound care at [facility] hyperbaric oxygen for foot wound dating back to 03/14/14 Had catheter placement to left peroneal and aortogram but could not tolerate hyperbaric oxygen -was status post salvage intervention first ray amputation on the right  on 12/21/14 and had progressive dehiscence of the right foot wound and had had this other surgery  History of right transtibial BKA admission-recent DC 2 SNF 02/13/15 History sinus syndrome status post PPM 2001-urgent pacemaker revision 10/12 because of RV lead fracture Status post CABG 1996  chronic kidney disease stage III Hypertension Diabetes mellitus Patient was readmitted 02/24/15 with profound hypoglycemia as well as profound following depletion, potassium 5.4. Diuretics were held  episodic lethargy 2/14 Unclear etiology  Tele no findings Ct head done no cva  No source.  I had a long chat about this with son and unknown causes.  We will monitor overnight and see if recurrent.  See below  HCAP ? SOB, acute hypoxic respiratory failure, 02 89% on room air , required 5liter oxygen supplement initial on presentation, :  Patient's cough and shortness of breath though likely caused by HCAP as shown by CXR of right retro-diaphragmatic opacity.  However Ct chest did not show evidence of pneumonia, but did reveal "Chronic small pleural effusion/collection at the left lung base with associated wall calcifications, the source of the chronic retro-diaphragmatic opacity described on chest x-ray of 03/02/2015."  - Urine legionella/ S. pneumococcal antigen are negative - blood culture x2 no growth, sputum culture not able to obtain. cough resolved at discharge, on room air. -Site of PPM on both R and L side clean and non-tender to palpation nor motion -He was treated with IV Vancomycin and cefepime from  admission, vanc d/ed on 2/11 (mrsa screen negative), cefepime d/ced on 2/13 after ct chest did not reveal pna, change to oral doxycyclin to cover for possible aspiration pna and bronchitis. Continue nebs/mucinex, now able to wean off oxygen at rest. -His CHF exacerbation may also contributed to his shortness of breath. Though ct chest did not review significant pulmonary edema either. but patient did have significant cough and wheezing initial of presentation, these symptom has resolved at discharge. - VQ scan on 2/13, low probability for PE.  Sepsis - initially on admission: with hypotension that responded to stress dose steroids/abx. Possible source bronchitis (he did have significant cough and wheezing on presentation) and uti.  -received stress dose steroids  -resolved but needed steroids again a couple of times  CHF exacerbation?:  Patient has worsening shortness breath, his proBNP is elevated, indicating CHF exacerbation. Due to soft blood pressure and possible sepsis, continue to hold off diuretics. -Continue aspirin -Troponin 3, mild and flat -2-D echo lvef 40-45%, grade 1 diastolic dysfunction -BNP 300 -current seems euvolemic, with poor oral intake will not give lasix discharge to [facility] withlasix prn due to prone to have dehydration.  Hypoglycemia:  Blood sugar was 23. This is likely due to decreased oral intake and continuation of glipizide. -Hold glipizide. S/p d5-1/2 NS at 50 cc/h  -resolved. D/c ivf D/c oral meds, change to ssi sliding scale, -CBG ranging 120-140's  Acute encephalopathy:  Most likely due to hypoglycemia and possible sepsis. His mental status comes back to baseline along with improvement of hypoglycemia. No focal neurologic findings on physical examination. -better  DM-II:  Last A1c 7.5, not well controled. Patient is taking glipizide at  home. Now presents with hypoglycemia -Hold glipizid as above -2/11blood sugar now elevated, start  ssi. discharge to snf with ssi, and close monitor blood sugar to prevent hypoglycemia episode.  H/o HTN:  Blood pressure is soft. patient is not taking medications for home. -Monitor blood pressure closely -he required stress dose steroid for blood pressure support. -home bp meds resumed at decreased dose at discharge.  BPH (benign prostatic hyperplasia): with indwelling foley Patient was discharged to snf with foley from last hospitalization on 2/7 -continue flomax, need outpatient urology follow up for voiding trial. -This is nidus for infection and I explained to son clearly this may cause further decline  CKD-III: stable. Baseline creatinine 2.7 on 02/26/15. His creatinine is 2.10. -Follow-up will BMP, renal dosing meds  CAD (coronary artery disease): no chest pain. -continue aspirin and when necessary nitroglycerin -Follow-up troponin 3, flat  Peripheral vascular disease (HCC): s/p of R BKA. -No acute issue   HLD: Last LDL was not on record. -Continue home medications: Zocor -ldl 50  UTI: +yeast -continue Diflucan to finish course. -received vanco and cefepime from admission, vanc d/ced on 2/11 -Ux no growth  Hyperkalemia on presentation, resolved with hydration.  Constipation: required enema with good result. Continue stool softener.  S/p right BKA on 1/20, Dr Lajoyce Corners aware of apteitn and will see    FTT; family agrees to palliative team consult at [facility] for possible transition to hospice. I had a long chat with the patient and son He has had Adult failure to thrive as evidenced by multiple infections and trips to the hospital I am not sure if he will make a meaningful recovery and have asked palliative care to see him in consult in-patient     DVT ppx: SQ Heparin (Pt has CKD-III, sq heparin is better choice than sq Lovenox)    Code Status: DNR Family Communication: patient and son in room Disposition Plan: return to snf on   2/14  Consultants:  none  Procedures:  V/Q scan  Antibiotics:  vanc/cefepime   Objective: BP 139/52 mmHg  Pulse 70  Temp(Src) 98.4 F (36.9 C) (Oral)  Resp 20  Wt 78.064 kg (172 lb 1.6 oz)  SpO2 100%  Intake/Output Summary (Last 24 hours) at 03/08/15 1117 Last data filed at 03/08/15 1610  Gross per 24 hour  Intake    255 ml  Output    950 ml  Net   -695 ml   Filed Weights   03/03/15 0345  Weight: 78.064 kg (172 lb 1.6 oz)    Exam:   General:  Frail, more alert, oriented x3  Cardiovascular: paced rhythm  Respiratory: improved lung exam,  no wheezing, no rales,  Abdomen: Soft/ND/NT, positive BS, foley in place  Musculoskeletal: s/p right bka, left upper extremity mild edema, left heal with protective dressing.   Neuro: now more alert  Data Reviewed: Basic Metabolic Panel:  Recent Labs Lab 03/04/15 0233 03/04/15 1252 03/05/15 0332 03/06/15 0240 03/07/15 0855  NA 137 135 137 138 137  K 5.9* 5.2* 5.4* 4.7 4.7  CL 104 107 106 104 105  CO2 22 21* 22 24 23   GLUCOSE 293* 285* 163* 149* 108*  BUN 32* 33* 35* 43* 46*  CREATININE 2.38* 2.47* 2.38* 2.15* 2.02*  CALCIUM 7.2* 7.1* 7.4* 7.6* 7.5*   Liver Function Tests:  Recent Labs Lab 03/03/15 0640  AST 35  ALT 21  ALKPHOS 70  BILITOT <0.1*  PROT 5.3*  ALBUMIN 1.6*   No results for  input(s): LIPASE, AMYLASE in the last 168 hours. No results for input(s): AMMONIA in the last 168 hours. CBC:  Recent Labs Lab 03/04/15 0233 03/05/15 0332 03/06/15 0240  WBC 7.2 7.6 7.8  HGB 8.5* 8.9* 8.6*  HCT 29.4* 29.8* 28.8*  MCV 89.1 86.9 85.7  PLT 228 282 271   Cardiac Enzymes:    Recent Labs Lab 03/03/15 0640 03/03/15 1115 03/03/15 1830  TROPONINI 0.12* 0.11* 0.07*   BNP (last 3 results)  Recent Labs  03/04/15 0233 03/05/15 0332  BNP 396.2* 398.3*    ProBNP (last 3 results) No results for input(s): PROBNP in the last 8760 hours.  CBG:  Recent Labs Lab 03/07/15 1617  03/07/15 1734 03/07/15 2300 03/08/15 0552 03/08/15 0705  GLUCAP 155* 147* 141* 122* 129*    Recent Results (from the past 240 hour(s))  Culture, blood (routine x 2) Call MD if unable to obtain prior to antibiotics being given     Status: None (Preliminary result)   Collection Time: 03/03/15  6:24 AM  Result Value Ref Range Status   Specimen Description BLOOD RIGHT HAND  Final   Special Requests BOTTLES DRAWN AEROBIC ONLY 4CC  Final   Culture NO GROWTH 4 DAYS  Final   Report Status PENDING  Incomplete  Urine culture     Status: None   Collection Time: 03/03/15  6:57 AM  Result Value Ref Range Status   Specimen Description URINE, CATHETERIZED  Final   Special Requests NONE  Final   Culture NO GROWTH 1 DAY  Final   Report Status 03/04/2015 FINAL  Final  MRSA PCR Screening     Status: None   Collection Time: 03/03/15  7:52 AM  Result Value Ref Range Status   MRSA by PCR NEGATIVE NEGATIVE Final    Comment:        The GeneXpert MRSA Assay (FDA approved for NASAL specimens only), is one component of a comprehensive MRSA colonization surveillance program. It is not intended to diagnose MRSA infection nor to guide or monitor treatment for MRSA infections.   Culture, blood (routine x 2) Call MD if unable to obtain prior to antibiotics being given     Status: None (Preliminary result)   Collection Time: 03/03/15 11:18 AM  Result Value Ref Range Status   Specimen Description BLOOD BLOOD RIGHT HAND  Final   Special Requests IN PEDIATRIC BOTTLE 3CC  Final   Culture NO GROWTH 4 DAYS  Final   Report Status PENDING  Incomplete     Studies: Ct Head Wo Contrast  03/07/2015  CLINICAL DATA:  82 year old male with new onset altered mental status and confusion. EXAM: CT HEAD WITHOUT CONTRAST TECHNIQUE: Contiguous axial images were obtained from the base of the skull through the vertex without intravenous contrast. COMPARISON:  CT dated 06/23/2012 FINDINGS: There is no acute intracranial  hemorrhage. There is mild moderate age-related atrophy and chronic microvascular ischemic changes. There is no mass effect or midline shift. There is partial opacification of the bilateral maxillary and sphenoid sinuses as well as partial opacification of multiple ethmoid air cells. The mastoid air cells are clear. The calvarium is intact. A hearing aid is noted on the right. IMPRESSION: No acute intracranial hemorrhage. Age-related atrophy and chronic microvascular ischemic disease. If symptoms persist and there are no contraindications, MRI may provide better evaluation if clinically indicated. Electronically Signed   By: Elgie Collard M.D.   On: 03/07/2015 21:23    Scheduled Meds: . aspirin  81 mg  Oral Daily  . bisacodyl  10 mg Rectal Q M,W,F  . doxycycline  100 mg Oral Q12H  . feeding supplement  1 Container Oral BID BM  . feeding supplement (PRO-STAT SUGAR FREE 64)  30 mL Oral BID  . guaiFENesin  600 mg Oral BID  . heparin  5,000 Units Subcutaneous 3 times per day  . insulin aspart  0-15 Units Subcutaneous TID WC  . ipratropium-albuterol  3 mL Nebulization TID  . multivitamin with minerals  1 tablet Oral Daily  . nitroGLYCERIN  0.2 mg Transdermal Daily  . pentoxifylline  400 mg Oral TID WC  . polyethylene glycol  17 g Oral Daily  . saccharomyces boulardii  250 mg Oral BID  . senna-docusate  2 tablet Oral BID  . simvastatin  40 mg Oral QPM  . tamsulosin  0.4 mg Oral QPM    Continuous Infusions:    Time spent:  Pleas Koch, MD [facility] [phone]

## 2015-03-08 NOTE — Clinical Social Work Note (Signed)
CSW was informed by physician that patient is not medically ready for discharge to [facility].  CSW updated [facility] admissions worker.  CSW to continue to follow patient's progress.  Ervin Knack. Marquin Patino, MSW, Theresia Majors [phone] 03/08/2015 2:53 PM

## 2015-03-08 NOTE — Discharge Instructions (Signed)
Wash right transtibial amputation with soap and water daily. Apply 4 x 4 gauze placed Ace wrap.

## 2015-03-09 DIAGNOSIS — Z515 Encounter for palliative care: Secondary | ICD-10-CM | POA: Insufficient documentation

## 2015-03-09 DIAGNOSIS — Z7189 Other specified counseling: Secondary | ICD-10-CM | POA: Insufficient documentation

## 2015-03-09 LAB — GLUCOSE, CAPILLARY
GLUCOSE-CAPILLARY: 137 mg/dL — AB (ref 65–99)
Glucose-Capillary: 107 mg/dL — ABNORMAL HIGH (ref 65–99)

## 2015-03-09 LAB — BASIC METABOLIC PANEL
ANION GAP: 8 (ref 5–15)
BUN: 45 mg/dL — ABNORMAL HIGH (ref 6–20)
CHLORIDE: 109 mmol/L (ref 101–111)
CO2: 24 mmol/L (ref 22–32)
CREATININE: 1.83 mg/dL — AB (ref 0.61–1.24)
Calcium: 7.8 mg/dL — ABNORMAL LOW (ref 8.9–10.3)
GFR calc non Af Amer: 30 mL/min — ABNORMAL LOW (ref >60)
GFR, EST AFRICAN AMERICAN: 35 mL/min — AB (ref >60)
Glucose, Bld: 139 mg/dL — ABNORMAL HIGH (ref 65–99)
POTASSIUM: 5.6 mmol/L — AB (ref 3.5–5.1)
SODIUM: 141 mmol/L (ref 135–145)

## 2015-03-09 LAB — CBC
HCT: 30 % — ABNORMAL LOW (ref 39.0–52.0)
HEMOGLOBIN: 9.2 g/dL — AB (ref 13.0–17.0)
MCH: 27.1 pg (ref 26.0–34.0)
MCHC: 30.7 g/dL (ref 30.0–36.0)
MCV: 88.2 fL (ref 78.0–100.0)
PLATELETS: 200 10*3/uL (ref 150–400)
RBC: 3.4 MIL/uL — AB (ref 4.22–5.81)
RDW: 17.1 % — ABNORMAL HIGH (ref 11.5–15.5)
WBC: 6.9 10*3/uL (ref 4.0–10.5)

## 2015-03-09 NOTE — Discharge Summary (Signed)
Discharge Summary  [name]:[id number] DOB: Nov 15, 1920  PCP: Lindwood Qua, MD  Admit date: 03/03/2015 Discharge date: 03/09/2015  Time spent: >55mins  Recommendations for Outpatient Follow-up:  1. F/u with PMD at SNF , family agree to palliative care consult at SNF for possible transition to hospice-if the patient does not have palliative care consult done within hospital 2. F/u with urology this week for voiding trial and indwelling foley management-continue Flomax. 3. F/u with orthopedic Dr. Lajoyce Corners s/p right BKA-staples were removed on 03/09/15 4. Completed antibiotics for possible aspiration pneumonitis and would use when necessary Lasix for acute inpatient of fluid or weight gain over 2 kg in 24 hour period of time 5. Recommend a sensitive sliding scale coverage for insulin at facility 6. Per discussion nursing home physician would discontinue many overlapping medications that may not offer mortality benefit such as Zocor Leim Fabry probiotics feeding supplements--patient is having poor intake and has failure to thrive  Discharge Diagnoses:  Active Hospital Problems   Diagnosis Date Noted  . HCAP (healthcare-associated pneumonia) 03/03/2015  . Unresponsive   . Acute encephalopathy 03/03/2015  . Hypoglycemia 03/03/2015  . UTI (urinary tract infection) 03/03/2015  . Hypertension   . Hyperlipidemia   . Coronary artery disease   . CVD (cardiovascular disease)   . Peripheral vascular disease (HCC)   . BPH (benign prostatic hyperplasia)   . CHF exacerbation (HCC)   . DM (diabetes mellitus), type 2 with renal complications (HCC) 12/20/2014  . CAD (coronary artery disease) 12/20/2014  . CKD (chronic kidney disease) stage 3, GFR 30-59 ml/min 12/20/2014  . Complete heart block (HCC) 12/10/2012  . Pacemaker-Medtronic 11/20/2011  . Diabetic peripheral neuropathy (HCC) 04/15/2008  . HLD (hyperlipidemia) 04/15/2008  . Essential hypertension 04/15/2008  . Sick sinus syndrome  (HCC) 04/15/2008    Resolved Hospital Problems   Diagnosis Date Noted Date Resolved  No resolved problems to display.    Discharge Condition: stable  Diet recommendation: heart healthy/carb modified, patient need to sit upright to eat, strict aspiration precaution Per swallow eval: Dysphagia 3 (Mech soft);Thin liquid   Liquid Administration via: Cup;Straw Medication Administration: Whole meds with puree Supervision: Patient able to self feed;Full supervision/cueing for compensatory strategies Compensations: Minimize environmental distractions;Slow rate;Small sips/bites Postural Changes: Seated upright at 90 degrees   Filed Weights   03/03/15 0345  Weight: 78.064 kg (172 lb 1.6 oz)    History of present illness:   82 year old male Initially being managed by wound care at [facility] hyperbaric oxygen for foot wound dating back to 03/14/14 Had catheter placement to left peroneal and aortogram but could not tolerate hyperbaric oxygen -was status post salvage intervention first ray amputation on the right on 12/21/14 and had progressive dehiscence of the right foot wound and had had this other surgery  History of right transtibial BKA admission-recent DC 2 SNF 02/13/15 History sinus syndrome status post PPM 2001-urgent pacemaker revision 10/12 because of RV lead fracture Status post CABG 1996 chronic kidney disease stage III Hypertension Diabetes mellitus Patient was readmitted 02/24/15 with profound hypoglycemia as well as profound following depletion, potassium 5.4. Diuretics were held  Pt is transferred from [facility] per Dr. Ilsa Iha.  Patient was recently admitted to and discharged from our service from 2/3-02/28/15 because of acute on chronic kidney injury and UTI due to yeast. He was treated and discharged on Diflucan. Per report, patient became unresponsive . He was also found to have shortness of breath and hypoglycemia with blood sugar level 23 in ED.  He was treated  with D50, 25 mL x 2 with improvement of mental status. Patient has shortness of breath and dry cough. ABG showed pH 7.26, PO2 153, PCO2 62. Chest x-ray showed right retro-diaphragmatic opacity in Ed of [facility].  On arrival to the floor of [place]. Mental status comes back to the baseline. He is orientated 3. He answered all questions appropriately. He reports that he has cough and mild shortness of breath, but no chest pain. He denies abdominal pain, diarrhea, symptoms of UTI. He moves all extremities (he has right BKA).  In ED of [facility], patient was found to have blood pressure 146/63, respiration rate 18, heart rate 101, WBC 9.7, temperature 96.1, stable renal function, INR 1.1, PTT 28.7, troponin 0.06-->0.08 which is gray zone in that hospital, pro-BNP 5720, positive urinalysis with 1+ leukocytes, stable renal function.    Hospital Course:  Principal Problem:   HCAP (healthcare-associated pneumonia) Active Problems:   Diabetic peripheral neuropathy (HCC)   HLD (hyperlipidemia)   Essential hypertension   Sick sinus syndrome (HCC)   Pacemaker-Medtronic   Complete heart block (HCC)   DM (diabetes mellitus), type 2 with renal complications (HCC)   CAD (coronary artery disease)   CKD (chronic kidney disease) stage 3, GFR 30-59 ml/min   Hypertension   Hyperlipidemia   Coronary artery disease   CVD (cardiovascular disease)   Peripheral vascular disease (HCC)   BPH (benign prostatic hyperplasia)   CHF exacerbation (HCC)   Acute encephalopathy   Hypoglycemia   UTI (urinary tract infection)   Unresponsive  episodic lethargy 2/14 Unclear etiology  Tele no findings Ct head done no cva No source. I had a long chat about this with son and unknown causes. We did monitor overnight to see what occurred and there was no recurrence  HCAP ? SOB, acute hypoxic respiratory failure, 02 89% on room air , required 5liter oxygen supplement initial on presentation, :   Patient's cough and shortness of breath though likely caused by HCAP as shown by CXR of right retro-diaphragmatic opacity.  However Ct chest did not show evidence of pneumonia, but did reveal "Chronic small pleural effusion/collection at the left lung base with associated wall calcifications, the source of the chronic retro-diaphragmatic opacity described on chest x-ray of 03/02/2015."  - Urine legionella/ S. pneumococcal antigen are negative - blood culture x2 no growth, sputum culture not able to obtain. cough resolved at discharge, on room air. -He was treated with IV Vancomycin and cefepime from admission, vanc d/ed on 2/11 (mrsa screen negative), cefepime d/ced on 2/13 after ct chest did not reveal pna, change to oral doxycyclin to cover for possible aspiration pna and bronchitis--> antibiotics were stopped on 216  Continue nebs/mucinex,  -His CHF exacerbation may also contributed to his shortness of breath. Though ct chest did not review significant pulmonary edema either. but patient did have significant cough and wheezing initial of presentation, these symptom has resolved at discharge. 2/13 Rn report patient does has intermittent hypoxia with exertion,  patient does has risk factor with recent right BKA and decreased mobility at baseline. , not able to get CTA due to cr elevation. VQ scan on 2/13, low probability for PE. 2/14 patient now is on room air at rest. No cough, lung clears.  Sepsis? Presented initially on admission: with hypotension that responded to stress dose steroids/abx. Possible source bronchitis versus aspiration pneumonitis (he did have significant cough and wheezing on presentation) and uti.  - 2/11patient also received  a few dose of stress dose steroids, bp better, d/c steroids, d/c ivf. Continue abx.  2/12 has to be put back on stress dose steroids due to borderline bp, persistent congested cough, add nebs, CT chest no acute findings. 2/13 bp better, d/c stress  dose steroids. 2/14: bp stable, restart home bp meds at lower dose with holding parameters.  CHF exacerbation?:  Patient has worsening shortness breath, his proBNP is elevated, indicating CHF exacerbation. Due to soft blood pressure and possible sepsis, continue to hold off diuretics. -Continue aspirin -Troponin 3, mild and flat -2-D echo lvef 40-45%, grade 1 diastolic dysfunction -BNP 300 -current seems euvolemic, with poor oral intake will not give lasix discharge to [facility] with  lasix prn due to prone to have dehydration.  Hypoglycemia:  Blood sugar was 23. This is likely due to decreased oral intake and continuation of glipizide. -Hold glipizide. S/p d5-1/2 NS at 50 cc/h  -resolved. D/c ivf D/c oral meds, change to ssi sliding scale, continue hypoglycemia precaution.  Acute encephalopathy:  Most likely due to hypoglycemia and possible sepsis. His mental status comes back to baseline along with improvement of hypoglycemia. No focal neurologic findings on physical examination. -better  DM-II:  Last A1c 7.5, not well controled. Patient is taking glipizide at home. Now presents with hypoglycemia -Hold glipizid as above -2/11blood sugar now elevated, start ssi. discharge to snf with ssi, and close monitor blood sugar to prevent hypoglycemia episode.  H/o HTN:  Blood pressure is soft. patient is not taking medications for home. -Monitor blood pressure closely -he required stress dose steroid for blood pressure support. -home bp meds resumed at decreased dose at discharge.  BPH (benign prostatic hyperplasia): with indwelling foley Patient was discharged to snf with foley from last hospitalization on 2/7 -continue flomax, need outpatient urology follow up for voiding trial.  CKD-III: stable. Baseline creatinine 2.7 on 02/26/15. His creatinine is 2.10. -Follow-up will BMP, renal dosing meds  CAD (coronary artery disease): no chest pain. -continue aspirin and when necessary  nitroglycerin -Follow-up troponin 3, flat -Continue Trental 400 3 times a day on discharge Continue metoprolol 12.5 twice a day  Peripheral vascular disease (HCC): s/p of R BKA. -No acute issue   HLD: Last LDL was not on record. -Continue home medications: Zocor -ldl 50  UTI: +yeast -continue Diflucan to finish course. -received vanco and cefepime from admission, vanc d/ced on 2/11 -Ux no growth discontinued Diflucan on 2/16 -  Hyperkalemia on presentation, resolved with hydration.  Constipation: required enema with good result. Continue stool softener.  S/p right BKA on 1/20, wound care consulted, will need to f/u with Dr Lajoyce Corners at discharge   FTT; family agrees to palliative team consult at [facility] for possible transition to hospice.    Code Status: DNR Family Communication: patient and son in room Disposition Plan: return to snf at discharge on 2/14   Consultants:  none  Procedures:  V/Q scan, low probability  Antibiotics:  Vanc/cefepime from admission to 2/10  Doxycycline from 2/10-completed appears to be a seven-day course on 2/16  Diflucan from admission discontinued 2/16   Discharge Exam: BP 149/61 mmHg  Pulse 73  Temp(Src) 98.4 F (36.9 C) (Oral)  Resp 18  Ht 5\' 10"  (1.778 m)  Wt 78.064 kg (172 lb 1.6 oz)  SpO2 97%  General: Alert pleasant oriented no apparent distress  Cardiovascular: s1 s2 no m/r/g Respiratory: Clear no added sound  Discharge Instructions You were cared for by a hospitalist during your hospital  stay. If you have any questions about your discharge medications or the care you received while you were in the hospital after you are discharged, you can call the unit and asked to speak with the hospitalist on call if the hospitalist that took care of you is not available. Once you are discharged, your primary care physician will handle any further medical issues. Please note that NO REFILLS for any discharge medications will be  authorized once you are discharged, as it is imperative that you return to your primary care physician (or establish a relationship with a primary care physician if you do not have one) for your aftercare needs so that they can reassess your need for medications and monitor your lab values.      Discharge Instructions    Diet - low sodium heart healthy    Complete by:  As directed      Increase activity slowly    Complete by:  As directed             Medication List    STOP taking these medications        docusate sodium 100 MG capsule  Commonly known as:  COLACE     fluconazole 100 MG tablet  Commonly known as:  DIFLUCAN     glipiZIDE 5 MG tablet  Commonly known as:  GLUCOTROL     methocarbamol 500 MG tablet  Commonly known as:  ROBAXIN     simvastatin 40 MG tablet  Commonly known as:  ZOCOR      TAKE these medications        acetaminophen 325 MG tablet  Commonly known as:  TYLENOL  Take 2 tablets (650 mg total) by mouth every 6 (six) hours as needed for mild pain (or Fever >/= 101).     aspirin 81 MG tablet  Take 81 mg by mouth daily.     bisacodyl 10 MG suppository  Commonly known as:  DULCOLAX  Place 10 mg rectally as needed for moderate constipation.     collagenase ointment  Commonly known as:  SANTYL  Apply 1 application topically daily.     feeding supplement (PRO-STAT SUGAR FREE 64) Liqd  Take 30 mLs by mouth 2 (two) times daily.     feeding supplement Liqd  Take 1 Container by mouth 2 (two) times daily between meals.     furosemide 40 MG tablet  Commonly known as:  LASIX  Take 1 tablet (40 mg total) by mouth daily as needed for fluid or edema.     guaiFENesin 600 MG 12 hr tablet  Commonly known as:  MUCINEX  Take 600 mg by mouth 2 (two) times daily.     HYDROcodone-acetaminophen 5-325 MG tablet  Commonly known as:  NORCO/VICODIN  Take 1 tablet by mouth every 6 (six) hours as needed for moderate pain.     insulin aspart 100 UNIT/ML  injection  Commonly known as:  NOVOLOG  Insulin sliding scale: (TID with meals, please check blood sugar TID with meals) Blood sugar  120-150   3units                       151-200   4units                       201-250   7units  251- 300  11units                       301-350   15uints                       351-400   20units                       >400         call MD immediately     MENS MULTIVITAMIN PLUS PO  Take 1 tablet by mouth daily.     metoprolol tartrate 25 MG tablet  Commonly known as:  LOPRESSOR  Take 0.5 tablets (12.5 mg total) by mouth 2 (two) times daily. Hold if sbp less than     nitroGLYCERIN 0.2 mg/hr patch  Commonly known as:  NITRODUR - Dosed in mg/24 hr  Place 0.2 mg onto the skin daily.     ondansetron 4 MG tablet  Commonly known as:  ZOFRAN  Take 4 mg by mouth every 6 (six) hours as needed for nausea or vomiting.     pentoxifylline 400 MG CR tablet  Commonly known as:  TRENTAL  Take 400 mg by mouth 3 (three) times daily with meals.     PHILLIPS COLON HEALTH PO  Take 1 capsule by mouth daily.     senna-docusate 8.6-50 MG tablet  Commonly known as:  Senokot-S  Take 2 tablets by mouth at bedtime.     tamsulosin 0.4 MG Caps capsule  Commonly known as:  FLOMAX  Take 0.4 mg by mouth every evening.       Allergies  Allergen Reactions  . Ace Inhibitors     Kidney failure  . Ciprofloxacin Rash   Follow-up Information    Follow up with Nadara Mustard, MD.   Specialty:  Orthopedic Surgery   Why:  s/p right bka on 1/20   Contact information:   [address] [place] [address] [phone]       Follow up with [facility] .   Specialty:  Skilled Nursing Facility   Contact information:   [address] [place] [address] [phone]      Follow up with DUDA,MARCUS V, MD In 2 weeks.   Specialty:  Orthopedic Surgery   Contact information:   [address] [address] [place]  [address] [phone]        The results of significant diagnostics from this hospitalization (including imaging, microbiology, ancillary and laboratory) are listed below for reference.    Significant Diagnostic Studies: Dg Chest 2 View  03/06/2015  CLINICAL DATA:  Persistent cough, CHF, heart disease EXAM: CHEST  2 VIEW COMPARISON:  03/02/2015 FINDINGS: Low lung volumes evident with cardiomegaly and vascular congestion. Basilar atelectasis present. Pacemakers present bilaterally. Prior coronary bypass changes. Aortic is atherosclerotic. No enlarging effusion or pneumothorax. No significant interval change. IMPRESSION: Stable cardiomegaly with vascular congestion, low lung volumes, and basilar atelectasis. Electronically Signed   By: Judie Petit.  Shick M.D.   On: 03/06/2015 19:25   Dg Chest 2 View  02/24/2015  CLINICAL DATA:  Cough, pneumonia EXAM: CHEST  2 VIEW COMPARISON:  05/12/2014 FINDINGS: Cardiomediastinal silhouette is stable. Bilateral dual lead cardiac pacemaker with leads in right atrium and right ventricle is unchanged in position. Status post CABG. No acute infiltrate or pulmonary edema. Stable pleural thickening and scarring left base. Osteopenia and mild degenerative changes thoracic spine.  IMPRESSION: No active disease. Stable chronic changes left base. Status post median sternotomy. Electronically Signed   By: Natasha Mead M.D.   On: 02/24/2015 13:02   Ct Head Wo Contrast  03/07/2015  CLINICAL DATA:  82 year old male with new onset altered mental status and confusion. EXAM: CT HEAD WITHOUT CONTRAST TECHNIQUE: Contiguous axial images were obtained from the base of the skull through the vertex without intravenous contrast. COMPARISON:  CT dated 06/23/2012 FINDINGS: There is no acute intracranial hemorrhage. There is mild moderate age-related atrophy and chronic microvascular ischemic changes. There is no mass effect or midline shift. There is partial opacification of the bilateral maxillary and  sphenoid sinuses as well as partial opacification of multiple ethmoid air cells. The mastoid air cells are clear. The calvarium is intact. A hearing aid is noted on the right. IMPRESSION: No acute intracranial hemorrhage. Age-related atrophy and chronic microvascular ischemic disease. If symptoms persist and there are no contraindications, MRI may provide better evaluation if clinically indicated. Electronically Signed   By: Elgie Collard M.D.   On: 03/07/2015 21:23   Ct Chest Wo Contrast  03/05/2015  CLINICAL DATA:  Pneumonia.  Opacity on chest x-ray. History of CHF, diabetes, chronic renal disease. EXAM: CT CHEST WITHOUT CONTRAST TECHNIQUE: Multidetector CT imaging of the chest was performed following the standard protocol without IV contrast. COMPARISON:  Chest x-rays dated 03/02/2015 and 02/24/2015. FINDINGS: Mediastinum/Lymph Nodes: Prominent atherosclerotic changes of the normal- caliber thoracic aorta. Heart size is upper normal. No pericardial effusion. Diffuse coronary artery calcifications. Patient is status post median sternotomy for CABG. No masses or enlarged lymph nodes within the mediastinum or perihilar regions. Trachea and central bronchi are unremarkable. Lungs/Pleura: Chronic small effusion at the left lung base, with associated wall calcifications. Subtle nodular density within the right middle lobe, pleural based, measuring 8 mm. Lungs otherwise clear. Upper abdomen: Cholelithiasis without evidence of acute cholecystitis. Limited images of the upper abdomen are otherwise unremarkable. Musculoskeletal: No chest wall mass or suspicious bone lesions identified. Degenerative changes throughout the kyphotic thoracic spine, mild to moderate in degree. No evidence of a significant central canal stenosis at any level. IMPRESSION: 1. No evidence of pneumonia. Chronic small pleural effusion/collection at the left lung base with associated wall calcifications, the source of the chronic  retro-diaphragmatic opacity described on chest x-ray of 03/02/2015. 2. Subtle 8 mm nodular density within the right middle lobe, pleural based. Suspect nodular atelectasis or scarring. Neoplastic nodule also possible. If the patient is at high risk for bronchogenic carcinoma, follow-up chest CT at 3-20months is recommended. If the patient is at low risk for bronchogenic carcinoma, follow-up chest CT at 6-12 months is recommended. This recommendation follows the consensus statement: Guidelines for Management of Small Pulmonary Nodules Detected on CT Scans: A Statement from the Fleischner Society as published in Radiology 2005; 237:395-400. 3. Prominent atherosclerotic changes as above. Patient is status post median sternotomy for CABG. 4. Overall, no acute findings. Electronically Signed   By: Bary Richard M.D.   On: 03/05/2015 14:47   US Renal  03/05/2015  CLINICAL DATA:  Elevated creatinine EXAM: RENAL / URINARY TRACT ULTRASOUND COMPLETE COMPARISON:  None. FINDINGS: Right Kidney: Length: 10.1 cm.  Cortical thinning.  No mass or hydronephrosis. Left Kidney: Length: 11.4 cm.  Cortical thinning.  No mass or hydronephrosis. Bladder: Decompressed by indwelling Foley catheter. IMPRESSION: Cortical thinning, suggesting medical renal disease. No hydronephrosis. Bladder decompressed by indwelling Foley catheter. Electronically Signed   By: Roselie Awkward.D.  On: 03/05/2015 12:07   Nm Pulmonary Perf And Vent  03/06/2015  CLINICAL DATA:  Cough shortness of breath EXAM: NUCLEAR MEDICINE VENTILATION - PERFUSION LUNG SCAN TECHNIQUE: Ventilation images were obtained in multiple projections using inhaled aerosol Tc-62m DTPA. Perfusion images were obtained in multiple projections after intravenous injection of Tc-54m MAA. RADIOPHARMACEUTICALS:  30.0 Technetium-98m DTPA aerosol inhalation and 4.1 Technetium-36m MAA IV COMPARISON:  Chest radiograph 02/24/2015 and 03/06/2015 FINDINGS: Ventilation: No focal ventilation  defect. Perfusion: No wedge shaped peripheral perfusion defects to suggest acute pulmonary embolism. Lateral images not performed. Photopenic defect corresponding to bilateral cardiac pacer generator is noted. IMPRESSION: No findings to suggest pulmonary embolism. Electronically Signed   By: Esperanza Heir M.D.   On: 03/06/2015 17:40   Dg Shoulder Right Port  03/03/2015  CLINICAL DATA:  Right acromioclavicular joint pain, no known injury, initial encounter EXAM: PORTABLE RIGHT SHOULDER - 2+ VIEW COMPARISON:  None. FINDINGS: Degenerative changes of the acromioclavicular joint are seen. No fracture or dislocation is noted. No gross soft tissue abnormality is noted. IMPRESSION: Acromioclavicular joint degenerative change without acute abnormality. Electronically Signed   By: Alcide Clever M.D.   On: 03/03/2015 12:38   Dg Swallowing Func-speech Pathology  02/26/2015  Objective Swallowing Evaluation: Type of Study: MBS-Modified Barium Swallow Study Patient Details Name: DEKE TILGHMAN MRN: [id number] Date of Birth: [birth year] Today's Date: 02/26/2015 Time: SLP Start Time (ACUTE ONLY): 0935-SLP Stop Time (ACUTE ONLY): 0958 SLP Time Calculation (min) (ACUTE ONLY): 23 min Past Medical History: Past Medical History Diagnosis Date . Hypertension  . Hyperlipidemia  . Diabetes mellitus  . Coronary artery disease    s/p CABG 1996 . Sick sinus syndrome (HCC)    s/p PPM 2001 (R sided),  RV lead fracture 10/12 with new system (MDT) placed on L side due to R sided venous occlusion . Diabetic neuropathy (HCC)  . CVD (cardiovascular disease)    s/p CEA . Peripheral vascular disease (HCC)  . Chronic kidney disease  . BPH (benign prostatic hyperplasia)  . Hypercholesteremia  . Myocardial infarction (HCC)  . CHF (congestive heart failure) (HCC)  . Ulcer of toe (HCC)  . Ulcer of heel and midfoot (HCC)  . Presence of permanent cardiac pacemaker  Past Surgical History: Past Surgical History Procedure Laterality Date . Coronary artery  bypass graft   . Carotid endarterectomy   . Pacemaker insertion     s/p PPM 2001 (R sided),  RV lead fracture 10/12 with new system (MDT) placed on Left side due to R sided venous occlusion . Hernia repair   . Cataract extraction w/ intraocular lens implant Bilateral  . Insert / replace / remove pacemaker   . Wound debridement Left 07/28/2014   Procedure: DEBRIDEMENT WOUND/LEFT HEEL DEBRIDMENT;  Surgeon: Annice Needy, MD;  Location: [facility] ORS;  Service: Vascular;  Laterality: Left; . Application of wound vac Left 07/28/2014   Procedure: APPLICATION OF WOUND VAC;  Surgeon: Annice Needy, MD;  Location: [facility] ORS;  Service: Vascular;  Laterality: Left; . Amputation Right 12/21/2014   Procedure: AMPUTATION RAY/RIGHT;  Surgeon: Nadara Mustard, MD;  Location: [facility] OR;  Service: Orthopedics;  Laterality: Right; . Amputation Right 02/10/2015   Procedure: Right Below Knee Amputation;  Surgeon: Nadara Mustard, MD;  Location: [facility] OR;  Service: Orthopedics;  Laterality: Right; HPI: 82 year old male with a history of right BKA, CK D, stage III, hypertension, diabetes, who presented to the emergency dept with complaints of hypoglycemia. CXR No active disease. Chart states  report facility stated pt has had poor intake and "hasn't been acting his normal." No prior St notes Pt. states he has difficulty swallowing (could not give details). Subjective: The patient was seen in radiology for MBSS.  He was in distress due to inability to urinate.  Nurse was made aware of the trouble and planned to scan bladder when the patient returned to his room.  Assessment / Plan / Recommendation CHL IP CLINICAL IMPRESSIONS 02/26/2015 Therapy Diagnosis Mild oral phase dysphagia;Mild pharyngeal phase dysphagia Clinical Impression MBSS was completed.  The patient presented with mild oropharyngeal dysphagia characterized by mildly delayed oral transit and premature spill given dual textured solids and mildly delayed swallow trigger.  Material was in the vallecular  prior to swallow initiation.  No penetration/aspiration was observed.  Of note, the patient had an intermittent cough during the MBSS that did not appear to be related to penetration/aspiration.  Esophgeal sweep revealed it mildly slow to clear.  ST will follow up for therapeutic diet tolerance.   Impact on safety and function Mild aspiration risk   CHL IP TREATMENT RECOMMENDATION 02/26/2015 Treatment Recommendations Therapy as outlined in treatment plan below   Prognosis 02/26/2015 Prognosis for Safe Diet Advancement Fair Barriers to Reach Goals -- Barriers/Prognosis Comment -- CHL IP DIET RECOMMENDATION 02/26/2015 SLP Diet Recommendations Dysphagia 3 (Mech soft) solids;Thin liquid Liquid Administration via Cup;Straw Medication Administration Whole meds with puree Compensations Slow rate;Small sips/bites;Follow solids with liquid Postural Changes Seated upright at 90 degrees   CHL IP OTHER RECOMMENDATIONS 02/26/2015 Recommended Consults -- Oral Care Recommendations Oral care BID Other Recommendations --   CHL IP FOLLOW UP RECOMMENDATIONS 02/25/2015 Follow up Recommendations (No Data)   CHL IP FREQUENCY AND DURATION 02/26/2015 Speech Therapy Frequency (ACUTE ONLY) min 2x/week Treatment Duration 2 weeks      CHL IP ORAL PHASE 02/26/2015 Oral Phase Impaired Oral - Pudding Teaspoon -- Oral - Pudding Cup -- Oral - Honey Teaspoon -- Oral - Honey Cup -- Oral - Nectar Teaspoon -- Oral - Nectar Cup -- Oral - Nectar Straw -- Oral - Thin Teaspoon -- Oral - Thin Cup -- Oral - Thin Straw -- Oral - Puree -- Oral - Mech Soft -- Oral - Regular -- Oral - Multi-Consistency Impaired mastication;Premature spillage Oral - Pill -- Oral Phase - Comment --  CHL IP PHARYNGEAL PHASE 02/26/2015 Pharyngeal Phase Impaired Pharyngeal- Pudding Teaspoon -- Pharyngeal -- Pharyngeal- Pudding Cup -- Pharyngeal -- Pharyngeal- Honey Teaspoon -- Pharyngeal -- Pharyngeal- Honey Cup -- Pharyngeal -- Pharyngeal- Nectar Teaspoon -- Pharyngeal -- Pharyngeal- Nectar Cup --  Pharyngeal -- Pharyngeal- Nectar Straw -- Pharyngeal -- Pharyngeal- Thin Teaspoon Delayed swallow initiation-vallecula Pharyngeal -- Pharyngeal- Thin Cup Delayed swallow initiation-vallecula Pharyngeal -- Pharyngeal- Thin Straw Delayed swallow initiation-vallecula Pharyngeal -- Pharyngeal- Puree -- Pharyngeal -- Pharyngeal- Mechanical Soft -- Pharyngeal -- Pharyngeal- Regular -- Pharyngeal -- Pharyngeal- Multi-consistency -- Pharyngeal -- Pharyngeal- Pill -- Pharyngeal -- Pharyngeal Comment --  CHL IP CERVICAL ESOPHAGEAL PHASE 02/26/2015 Cervical Esophageal Phase WFL Pudding Teaspoon -- Pudding Cup -- Honey Teaspoon -- Honey Cup -- Nectar Teaspoon -- Nectar Cup -- Nectar Straw -- Thin Teaspoon -- Thin Cup -- Thin Straw -- Puree -- Mechanical Soft -- Regular -- Multi-consistency -- Pill -- Cervical Esophageal Comment -- CHL IP GO 02/26/2015 Functional Assessment Tool Used ASHA NOMS and clinical judgment.   Functional Limitations Swallowing Swallow Current Status [phone]) CJ Swallow Goal Status (U0454) CJ Swallow Discharge Status [phone]) (None) Motor Speech Current Status [phone]) (None) Motor Speech Goal  Status [phone]) (None) Motor Speech Goal Status [phone]) (None) Spoken Language Comprehension Current Status [phone]) (None) Spoken Language Comprehension Goal Status [phone]) (None) Spoken Language Comprehension Discharge Status [phone]) (None) Spoken Language Expression Current Status [phone]) (None) Spoken Language Expression Goal Status [phone]) (None) Spoken Language Expression Discharge Status [phone]) (None) Attention Current Status (I4332) (None) Attention Goal Status (R5188) (None) Attention Discharge Status [phone]) (None) Memory Current Status (Y3016) (None) Memory Goal Status (W1093) (None) Memory Discharge Status (A3557) (None) Voice Current Status [phone]) (None) Voice Goal Status (R4270) (None) Voice Discharge Status [phone]) (None) Other Speech-Language Pathology Functional Limitation [phone]) (None) Other  Speech-Language Pathology Functional Limitation Goal Status (V7616) (None) Other Speech-Language Pathology Functional Limitation Discharge Status [phone]) (None) Dimas Aguas, MA, CCC-SLP Acute Rehab SLP [phone] Fleet Contras 02/26/2015, 10:16 AM               Microbiology: Recent Results (from the past 240 hour(s))  Culture, blood (routine x 2) Call MD if unable to obtain prior to antibiotics being given     Status: None   Collection Time: 03/03/15  6:24 AM  Result Value Ref Range Status   Specimen Description BLOOD RIGHT HAND  Final   Special Requests BOTTLES DRAWN AEROBIC ONLY 4CC  Final   Culture NO GROWTH 5 DAYS  Final   Report Status 03/08/2015 FINAL  Final  Urine culture     Status: None   Collection Time: 03/03/15  6:57 AM  Result Value Ref Range Status   Specimen Description URINE, CATHETERIZED  Final   Special Requests NONE  Final   Culture NO GROWTH 1 DAY  Final   Report Status 03/04/2015 FINAL  Final  MRSA PCR Screening     Status: None   Collection Time: 03/03/15  7:52 AM  Result Value Ref Range Status   MRSA by PCR NEGATIVE NEGATIVE Final    Comment:        The GeneXpert MRSA Assay (FDA approved for NASAL specimens only), is one component of a comprehensive MRSA colonization surveillance program. It is not intended to diagnose MRSA infection nor to guide or monitor treatment for MRSA infections.   Culture, blood (routine x 2) Call MD if unable to obtain prior to antibiotics being given     Status: None   Collection Time: 03/03/15 11:18 AM  Result Value Ref Range Status   Specimen Description BLOOD BLOOD RIGHT HAND  Final   Special Requests IN PEDIATRIC BOTTLE [facility]  Final   Culture NO GROWTH 5 DAYS  Final   Report Status 03/08/2015 FINAL  Final     Labs: Basic Metabolic Panel:  Recent Labs Lab 03/04/15 1252 03/05/15 0332 03/06/15 0240 03/07/15 0855 03/09/15 0657  NA 135 137 138 137 141  K 5.2* 5.4* 4.7 4.7 5.6*  CL 107 106 104 105 109  CO2 21*  22 24 23 24   GLUCOSE 285* 163* 149* 108* 139*  BUN 33* 35* 43* 46* 45*  CREATININE 2.47* 2.38* 2.15* 2.02* 1.83*  CALCIUM 7.1* 7.4* 7.6* 7.5* 7.8*   Liver Function Tests:  Recent Labs Lab 03/03/15 0640  AST 35  ALT 21  ALKPHOS 70  BILITOT <0.1*  PROT 5.3*  ALBUMIN 1.6*   No results for input(s): LIPASE, AMYLASE in the last 168 hours. No results for input(s): AMMONIA in the last 168 hours. CBC:  Recent Labs Lab 03/04/15 0233 03/05/15 0332 03/06/15 0240 03/09/15 0657  WBC 7.2 7.6 7.8 6.9  HGB 8.5* 8.9* 8.6* 9.2*  HCT 29.4*  29.8* 28.8* 30.0*  MCV 89.1 86.9 85.7 88.2  PLT 228 282 271 200   Cardiac Enzymes:  Recent Labs Lab 03/03/15 0640 03/03/15 1115 03/03/15 1830  TROPONINI 0.12* 0.11* 0.07*   BNP: BNP (last 3 results)  Recent Labs  03/04/15 0233 03/05/15 0332  BNP 396.2* 398.3*    ProBNP (last 3 results) No results for input(s): PROBNP in the last 8760 hours.  CBG:  Recent Labs Lab 03/08/15 0705 03/08/15 1130 03/08/15 1652 03/08/15 2225 03/09/15 0652  GLUCAP 129* 125* 177* 137* 137*       Signed:  Rhetta Mura MD, PhD  [facility] 03/09/2015, 11:33 AM

## 2015-03-09 NOTE — Progress Notes (Signed)
SLP Cancellation Note  Patient Details Name: [name] MRN: [id number] DOB: 01/10/1921   Cancelled treatment:       Reason Eval/Treat Not Completed: Patient declined, no reason specified   Maxcine Ham, M.A. CCC-SLP [phone]  Maxcine Ham 03/09/2015, 9:54 AM

## 2015-03-09 NOTE — Clinical Social Work Note (Signed)
Patient to be d/c'ed today to [facility].  Patient and family agreeable to plans will transport via ems RN to call report to [phone] .  Windell Moulding, MSW, Theresia Majors [phone]

## 2015-03-09 NOTE — Progress Notes (Signed)
Report called to Lerry Liner, LPN, [facility] receiving Nurse @ [phone]. Son Nadine Counts at the bedside at this moment. Awaiting  PTAR to transfer pt out.

## 2015-03-09 NOTE — Progress Notes (Signed)
Upon shift change report,  called by PT x 2  Via phone to come into room 5N30 while assisting and assessing  patient in  room 5N21. Stated would like this nurse to  Come "see patient before turning him" Found Pt in bed, right sided lying RUE on side rail upon entered,.  Ecchymosis and non pitting edema noted to bilateral upper extrimities, with few whipping areas. Sacrum and buttocks wounds foam dressings in place and intact. Pt alert, oriented, and conversant. Denies pain.  Noted and removed  a  flat sheet underneath the fitted sheet  When patient assisted to sitting position  with feet dangle per PT. This nurse exit room once assessment completed.

## 2015-03-09 NOTE — Consult Note (Signed)
Consultation Note Date: 03/09/2015   Patient Name: [name]  DOB: 1920-12-19  MRN: [id number]  Age / Sex: 82 y.o., male  PCP: [name] Bott, MD Referring Physician: Nita Sells, MD  Reason for Consultation: Establishing goals of care    Clinical Assessment/Narrative: 82 yo male [name] with CABG/pacemaker, CHF (EF 40%) PVD with recent Rt BKA, CKD stage 3 and DM who has had 4 admissions in the last 6 months.   He was admitted on 2/10 with HCAP, Sepsis and CHF exacerbation.  I met with [name] and his son [name].  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   His wife passed 2 years ago and she is in heaven waiting & cleaning house for him.  [name] denies any pain.  He complains of continuous non-productive cough.  He son reports that the patient has significant pain from his pressure ulcers with movement.    [name] has a living will and his son, [name] is his HCPOA.  In my discussion with [name] he indicated that his father would prefer the comfort approach and would likely prefer not to return to the hospital.  We discussed the MOST form and I gave him copy.  He said he would consider it, but did not want to fill it out today.  We discussed Hospice services.  [name] as he is a long term resident Hospice services would be covered by his Medicare benefit.    I reviewed [name]' Advanced Directive which states:  I desire that my life not be prolonged by life-sustaining procedures  If I am:    Terminally ill   Permanently in a Coma   Suffer severe dementia   Or am in a persistent vegetative state.  [name],  told me clearly that he has been thru this with his mother and mother in law and he would call in Hospice services when the time is right.  He felt like it was not time yet.   I strongly encouraged him to take  advantage of hospice services earlier rather than later as they would ease his father's discomfort and help prevent a return to the hospital.  A "Hard Choices Booklet was left with [name]"  Contacts/Participants in Discussion: Son [name] [facility]) and the patient   SUMMARY OF RECOMMENDATIONS  Code Status/Advance Care Planning: DNR       Other Directives:Advanced Directive placed on chart.  Symptom Management:   Consider addition of Hydrocodone cough syrup QHS PRN for cough to allow him to rest  Palliative Prophylaxis:   Palliative Wound Care  Additional Recommendations  My recommendation is for a full comfort approach with palliative wound care.  I recommend he not return to the hospital.  [name] was not quite ready to commit do this plan yet.  Psycho-social/Spiritual:  Support System: Adequate Additional Recommendations: Caregiving  Support/Resources  Prognosis: Weeks to months.  Discharge Planning: [facility] for rehab with Palliative care service follow-up   Chief Complaint/ Primary Diagnoses: Present on Admission:  . Hypertension . Hyperlipidemia . Coronary artery disease . CVD (cardiovascular disease) . Peripheral vascular disease ([place]) . BPH (benign prostatic hyperplasia) . Diabetic peripheral neuropathy ([place]) . HLD (hyperlipidemia) . Essential hypertension . Sick sinus syndrome ([place]) . Pacemaker-[employer]tr[employer] . Complete heart block ([place]) . DM (diabetes mellitus), type 2 with renal complications ([place]) . CAD (coronary artery disease) . CKD (chronic kidney  disease) stage 3, GFR 30-59 ml/min . HCAP (healthcare-associated pneumonia) . Acute encephalopathy . Hypoglycemia . UTI (urinary tract infection)  I have reviewed the medical record, interviewed the patient and family, and examined the patient. The following aspects are pertinent.  Past Medical History  Diagnosis Date  . Hypertension   . Hyperlipidemia   . Coronary artery disease      s/p CABG 1996  . Sick sinus syndrome ([place])     s/p PPM 2001 (R sided),  RV lead fracture 10/12 with new system (MDT) placed on L side due to R sided venous occlusion  . CVD (cardiovascular disease)     s/p CEA  . BPH (benign prostatic hyperplasia)   . Hypercholesteremia   . Myocardial infarction ([employer])   . Ulcer of toe ([place])   . Ulcer of heel and midfoot ([place])   . Presence of permanent cardiac pacemaker   . Type II diabetes mellitus ([place])   . Diabetic neuropathy ([place])   . CHF (congestive heart failure) ([place])   . Peripheral vascular disease ([place])   . PAD (peripheral artery disease) ([place])   . Chronic kidney disease (CKD), stage III (moderate)     [name] 03/08/2015  . HCAP (healthcare-associated pneumonia) 03/03/2015  . Pneumonia 02/2015   Social History   Social History  . Marital Status: Widowed    Spouse Name: N/A  . Number of Children: N/A  . Years of Education: N/A   Social History Main Topics  . Smoking status: Former Smoker -- 1.00 packs/day for 4 years    Types: Cigarettes    Quit date: 11/07/1975  . Smokeless tobacco: Never Used  . Alcohol Use: No  . Drug Use: No  . Sexual Activity: No   Other Topics Concern  . None   Social History Narrative   Family History  Problem Relation Age of Onset  . CAD Brother    Scheduled Meds: . aspirin  81 mg Oral Daily  . bisacodyl  10 mg Rectal Q M,W,F  . doxycycline  100 mg Oral Q12H  . feeding supplement  1 Container Oral BID BM  . feeding supplement (PRO-STAT SUGAR FREE 64)  30 mL Oral BID  . guaiFENesin  600 mg Oral BID  . heparin  5,000 Units Subcutaneous 3 times per day  . insulin aspart  0-15 Units Subcutaneous TID WC  . ipratropium-albuterol  3 mL Nebulization TID  . multivitamin with minerals  1 tablet Oral Daily  . nitroGLYCERIN  0.2 mg Transdermal Daily  . pentoxifylline  400 mg Oral TID WC  . polyethylene glycol  17 g Oral Daily  . saccharomyces boulardii  250 mg Oral BID  . senna-docusate  2 tablet Oral BID    . simvastatin  40 mg Oral QPM  . tamsulosin  0.4 mg Oral QPM   Continuous Infusions:  PRN Meds:.acetaminophen, dextrose, HYDROcodone-acetaminophen, ipratropium-albuterol, methocarbamol, technetium TC 79M diethylenetriame-pentaacetic acid Medications Prior to Admission:  Prior to Admission medications   Medication Sig Start Date End Date Taking? Authorizing Provider  acetaminophen (TYLENOL) 325 MG tablet Take 2 tablets (650 mg total) by mouth every 6 (six) hours as needed for mild pain (or Fever >/= 101). 02/13/15  Yes Newt Minion, MD  aspirin 81 MG tablet Take 81 mg by mouth daily.   Yes Historical Provider, MD  docusate sodium (COLACE) 100 MG capsule Take 100 mg by mouth 2 (two) times daily.   Yes Historical Provider, MD  glipiZIDE (GLUCOTROL) 5 MG tablet  Take 0.5 tablets (2.5 mg total) by mouth 2 (two) times daily before a meal. 02/28/15  Yes Velvet Bathe, MD  guaiFENesin (MUCINEX) 600 MG 12 hr tablet Take 600 mg by mouth 2 (two) times daily.   Yes Historical Provider, MD  Multiple Vitamins-Minerals (MENS MULTIVITAMIN PLUS PO) Take 1 tablet by mouth daily.   Yes Historical Provider, MD  ondansetron (ZOFRAN) 4 MG tablet Take 4 mg by mouth every 6 (six) hours as needed for nausea or vomiting.   Yes Historical Provider, MD  pentoxifylline (TRENTAL) 400 MG CR tablet Take 400 mg by mouth 3 (three) times daily with meals.   Yes Historical Provider, MD  Probiotic Product ([place]) Take 1 capsule by mouth daily.   Yes Historical Provider, MD  simvastatin (ZOCOR) 40 MG tablet Take 40 mg by mouth every evening.    Yes Historical Provider, MD  Tamsulosin HCl (FLOMAX) 0.4 MG CAPS Take 0.4 mg by mouth every evening.    Yes Historical Provider, MD  acetaminophen (TYLENOL) 650 MG suppository Place 650 mg rectally every 4 (four) hours as needed.    Historical Provider, MD  Amino Acids-Protein Hydrolys (FEEDING SUPPLEMENT, PRO-STAT SUGAR FREE 64,) LIQD Take 30 mLs by mouth 2 (two) times daily.  03/07/15   Florencia Reasons, MD  bisacodyl (DULCOLAX) 10 MG suppository Place 10 mg rectally as needed for moderate constipation.    Historical Provider, MD  collagenase (SANTYL) ointment Apply 1 application topically daily.    Historical Provider, MD  feeding supplement (BOOST / RESOURCE BREEZE) LIQD Take 1 Container by mouth 2 (two) times daily between meals. 03/07/15   Florencia Reasons, MD  furosemide (LASIX) 40 MG tablet Take 1 tablet (40 mg total) by mouth daily as needed for fluid or edema. 03/07/15   Florencia Reasons, MD  HYDROcodone-acetaminophen (NORCO/VICODIN) 5-325 MG tablet Take 1 tablet by mouth every 6 (six) hours as needed for moderate pain. 03/07/15   Florencia Reasons, MD  insulin aspart (NOVOLOG) 100 UNIT/ML injection Insulin sliding scale: (TID with meals, please check blood sugar TID with meals) Blood sugar  120-150   3units                       151-200   4units                       201-250   7units                       251- 300  11units                       301-350   15uints                       351-400   20units                       >400         call MD immediately 03/07/15   Florencia Reasons, MD  methocarbamol (ROBAXIN) 500 MG tablet Take 1 tablet (500 mg total) by mouth every 8 (eight) hours as needed for muscle spasms. 12/23/14   Barton Dubois, MD  metoprolol tartrate (LOPRESSOR) 25 MG tablet Take 0.5 tablets (12.5 mg total) by mouth 2 (two) times daily. Hold if sbp less than 39mHg 03/07/15   FFlorencia Reasons MD  nitroGLYCERIN (NITRODUR - DOSED IN  MG/24 HR) 0.2 mg/hr patch Place 0.2 mg onto the skin daily.    Historical Provider, MD  senna-docusate (SENOKOT-S) 8.6-50 MG tablet Take 2 tablets by mouth at bedtime. 03/07/15   Florencia Reasons, MD   Allergies  Allergen Reactions  . Ace Inhibitors     Kidney failure  . Ciprofloxacin Rash    Review of Systems  Constitutional: Positive for activity change, appetite change and fatigue.  HENT: Negative.   Eyes: Positive for discharge.  Respiratory: Positive for cough and shortness of  breath.   Cardiovascular: Negative.   Gastrointestinal: Negative for diarrhea and constipation.  Endocrine: Negative.   Genitourinary: Positive for dysuria.  Musculoskeletal: Negative.   Skin: Positive for wound.  Allergic/Immunologic: Negative.   Neurological: Positive for weakness.  Hematological: Negative.   Psychiatric/Behavioral: Positive for sleep disturbance.     Physical Exam\ Pleasant elderly, chronically ill appearing male, appears tired. CV RRR Resp:  Non productive, rattling cough, no increased work of breathing. Abdomen:  Soft nt, nd, +bs   Vital Signs: BP 149/61 mmHg  Pulse 73  Temp(Src) 98.4 F (36.9 C) (Oral)  Resp 18  Ht 5' 10"  (1.778 m)  Wt 78.064 kg (172 lb 1.6 oz)  SpO2 97%  SpO2: SpO2: 97 % O2 Device:SpO2: 97 % O2 Flow Rate: .O2 Flow Rate (L/min): 3 L/min  IO: Intake/output summary:  Intake/Output Summary (Last 24 hours) at 03/09/15 1238 Last data filed at 03/09/15 0516  Gross per 24 hour  Intake    240 ml  Output    800 ml  Net   -560 ml    LBM: Last BM Date: 03/08/15 Baseline Weight: Weight: 78.064 kg (172 lb 1.6 oz) Most recent weight: Weight: 78.064 kg (172 lb 1.6 oz)      Palliative Assessment/Data:  Flowsheet Rows        Most Recent Value   Intake Tab    Referral Department  Hospitalist   Unit at Time of Referral  Med/Surg Unit   Palliative Care Primary Diagnosis  Cardiac   Date Notified  03/08/15   Palliative Care Type  New Palliative care   Reason for referral  Clarify Goals of Care   Date of Admission  03/05/15   Date first seen by Palliative Care  03/09/15   # of days Palliative referral response time  1 Day(s)   # of days IP prior to Palliative referral  3   Clinical Assessment    Palliative Performance Scale Score  30%   Pain Max last 24 hours  6   Pain Min Last 24 hours  2   Dyspnea Max Last 24 Hours  4   Dyspnea Min Last 24 hours  2   Psychosocial & Spiritual Assessment    Palliative Care Outcomes    Patient/Family  meeting held?  Yes   Who was at the meeting?  patient and son, [name]   Palliative Care Outcomes  Clarified goals of care   Palliative Care follow-up planned  Yes, Facility      Additional Data Reviewed:  CBC:    Component Value Date/Time   WBC 6.9 03/09/2015 0657   WBC 8.1 05/12/2014 1542   HGB 9.2* 03/09/2015 0657   HGB 12.1* 05/12/2014 1542   HCT 30.0* 03/09/2015 0657   HCT 37.0* 05/12/2014 1542   PLT 200 03/09/2015 0657   PLT 276 05/12/2014 1542   MCV 88.2 03/09/2015 0657   MCV 88 05/12/2014 1542   NEUTROABS 7.2 02/24/2015 1420  NEUTROABS 5.4 05/12/2014 1542   LYMPHSABS 1.2 02/24/2015 1420   LYMPHSABS 1.5 05/12/2014 1542   MONOABS 0.7 02/24/2015 1420   MONOABS 0.9 05/12/2014 1542   EOSABS 0.1 02/24/2015 1420   EOSABS 0.2 05/12/2014 1542   BASOSABS 0.1 02/24/2015 1420   BASOSABS 0.0 05/12/2014 1542   Comprehensive Metabolic Panel:    Component Value Date/Time   NA 141 03/09/2015 0657   K 5.6* 03/09/2015 0657   K 5.0 07/28/2014   CL 109 03/09/2015 0657   CO2 24 03/09/2015 0657   BUN 45* 03/09/2015 0657   CREATININE 1.83* 03/09/2015 0657   GLUCOSE 139* 03/09/2015 0657   CALCIUM 7.8* 03/09/2015 0657   AST 35 03/03/2015 0640   ALT 21 03/03/2015 0640   ALKPHOS 70 03/03/2015 0640   BILITOT <0.1* 03/03/2015 0640   PROT 5.3* 03/03/2015 0640   ALBUMIN 1.6* 03/03/2015 0640     Time In: 11:50 Time Out: 1:00 Time Total: 70 min. Greater than 50%  of this time was spent counseling and coordinating care related to the above assessment and plan.  Signed by:  Imogene Burn, PA-C Palliative Medicine Pager: [phone]  03/09/2015, 12:38 PM  Please contact Palliative Medicine Team phone at [phone] for questions and concerns.

## 2015-03-09 NOTE — Progress Notes (Signed)
Physical Therapy Treatment Patient Details Name: [name] MRN: [id number] DOB: [birth year] Today's Date: 03/09/2015    History of Present Illness 82 y.o. male with PMH of hypertension, hyperlipidemia, diabetes mellitus, CAD, S/P of CABG, sick sinus syndrome, third-degree AV block, s/p of pacemaker placement, BPH, congestive heart failure, PVD (S/p of right BKA), chronic kidney disease-stage III, presents with altered mental status, hypoglycemia and shortness of breath    PT Comments    Pt in bed against right bed rail on arrival with pressure wound noted to right forearm, RN notified and present to witness. Pt with cues and assist for transfers with fair balance EOB but lack of carryover from prior session. Pt with BM on arrival and assist for rolling pericare, positioning end of session in chair position with pillow under right hip and both arms elevated on pillows. RN made aware of all above. Will continue to follow and hope to attempt AP transfer bed to chair next session.   Follow Up Recommendations  SNF;Supervision/Assistance - 24 hour     Equipment Recommendations       Recommendations for Other Services       Precautions / Restrictions Precautions Precautions: Fall Precaution Comments: R BKA    Mobility  Bed Mobility Overal bed mobility: Needs Assistance Bed Mobility: Rolling;Sidelying to Sit Rolling: Max assist Sidelying to sit: +2 for physical assistance;Total assist   Sit to supine: +2 for physical assistance;Total assist   General bed mobility comments: pt max assist to roll bil for cleaning and positioning, total assist to transition to sitting and back to supine with cues for sequence, assist to elevate trunk and bring legs onto surface. EOB pt able to sit 6 min minguard with bil UE support.  Transfers                 General transfer comment: Unable to attempt transfer as no recliner in room and none available on floor  Ambulation/Gait                  Stairs            Wheelchair Mobility    Modified Rankin (Stroke Patients Only)       Balance     Sitting balance-Leahy Scale: Fair Sitting balance - Comments: EOB 6 min with bil UE support, cues for midline position                            Cognition Arousal/Alertness: Lethargic Behavior During Therapy: Flat affect Overall Cognitive Status: No family/caregiver present to determine baseline cognitive functioning                      Exercises General Exercises - Lower Extremity Long Arc Quad: AAROM;Both;10 reps;Seated Heel Slides: AAROM;Both;10 reps;Supine Hip ABduction/ADduction: AAROM;Both;10 reps;Supine    General Comments        Pertinent Vitals/Pain Pain Score: 7  Pain Location: generalized Pain Descriptors / Indicators: Grimacing Pain Intervention(s): Limited activity within patient's tolerance;Monitored during session;Repositioned;Other (comment) (pt denied need for pain medicine)    Home Living                      Prior Function            PT Goals (current goals can now be found in the care plan section) Progress towards PT goals: Not progressing toward goals - comment (pt with limited assist with mobility  and confusion)    Frequency       PT Plan Current plan remains appropriate    Co-evaluation             End of Session   Activity Tolerance: Patient tolerated treatment well Patient left: in bed;with call bell/phone within reach;with bed alarm set     Time: 0828-0900 PT Time Calculation (min) (ACUTE ONLY): 32 min  Charges:  $Therapeutic Exercise: 8-22 mins $Therapeutic Activity: 8-22 mins                    G Codes:      Delorse Lek 2015/03/22, 9:27 AM Delaney Meigs, PT [phone]

## 2015-04-22 DEATH — deceased

## 2017-01-01 IMAGING — DX DG FOOT COMPLETE 3+V*R*
3 series · 3 of 3 positions shown · non-contrast
Comparison: None.

CLINICAL DATA: Wound and infection at the superior right great toe,
status post fall 1 month ago. Initial encounter.

EXAM:
RIGHT FOOT COMPLETE - 3+ VIEW

[x foot ap right]
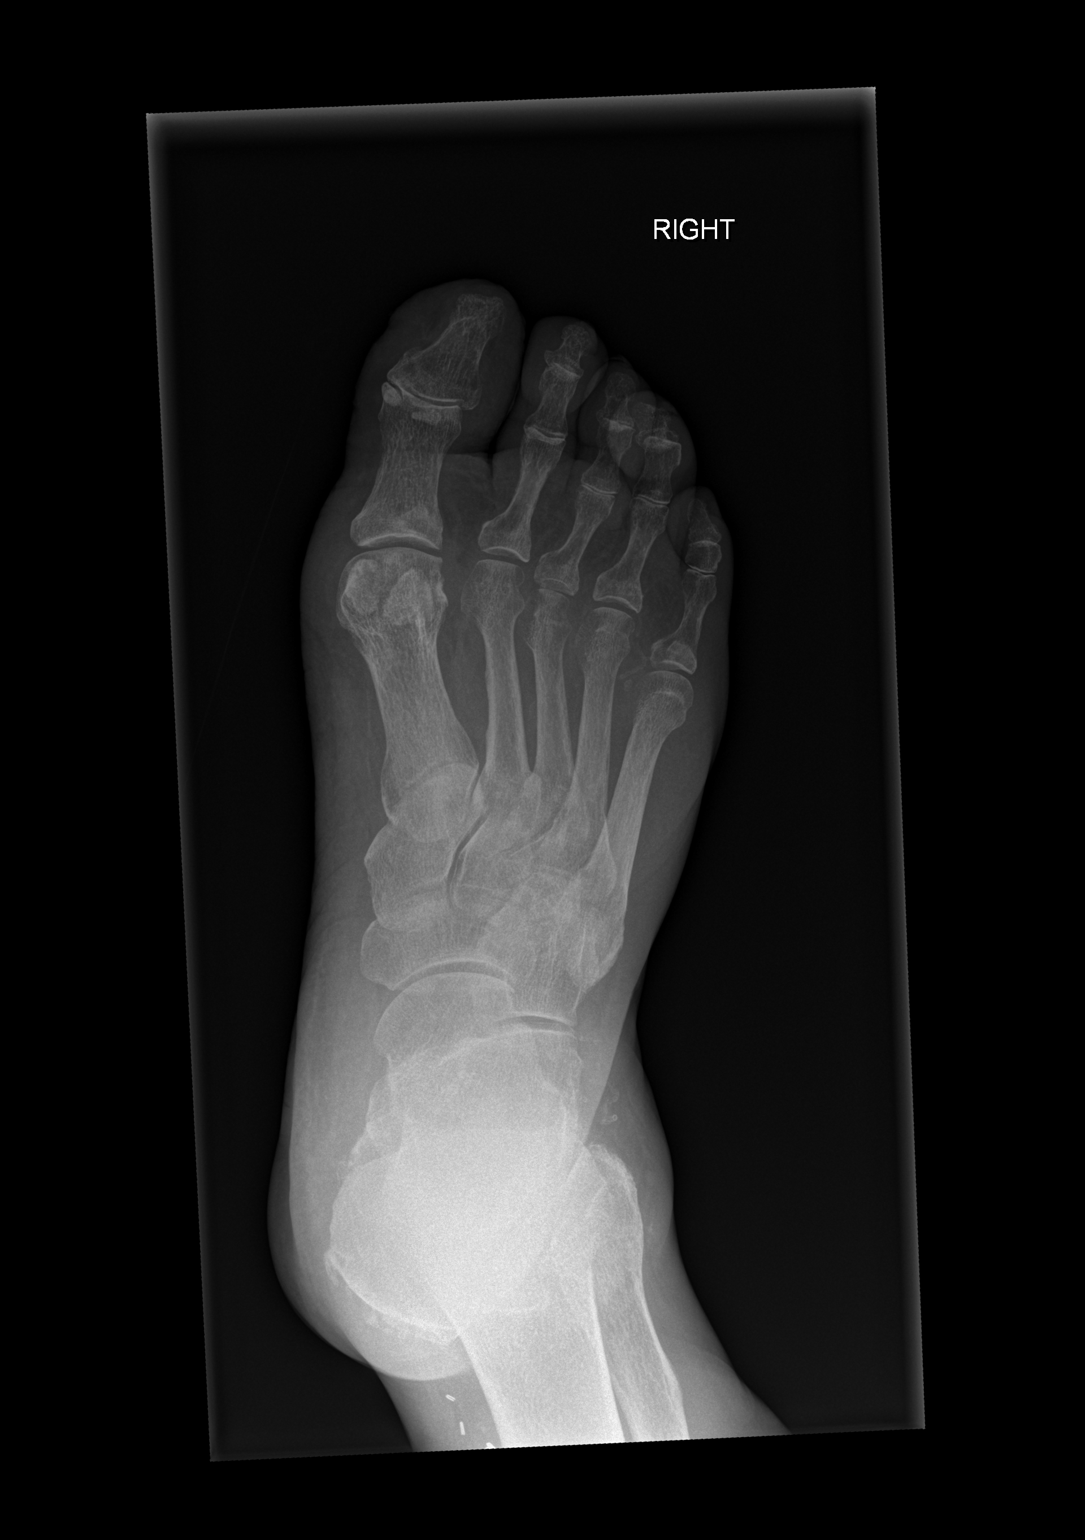

[x foot obl right]
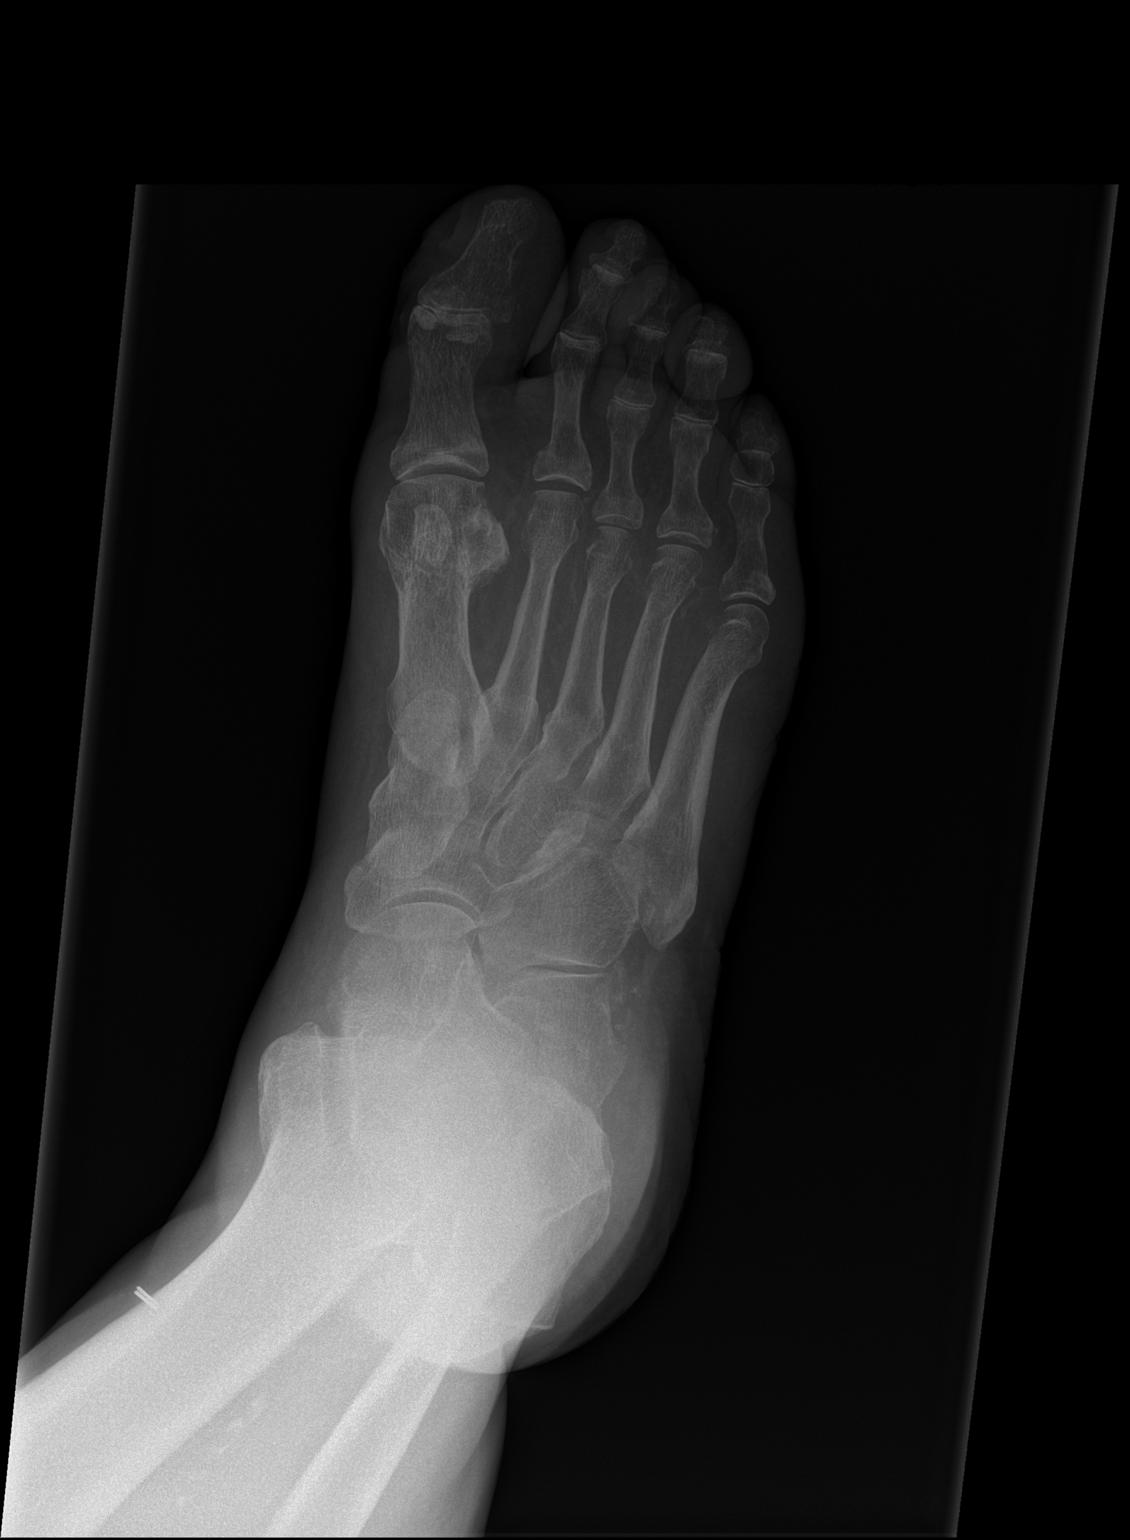

[x foot lat right]
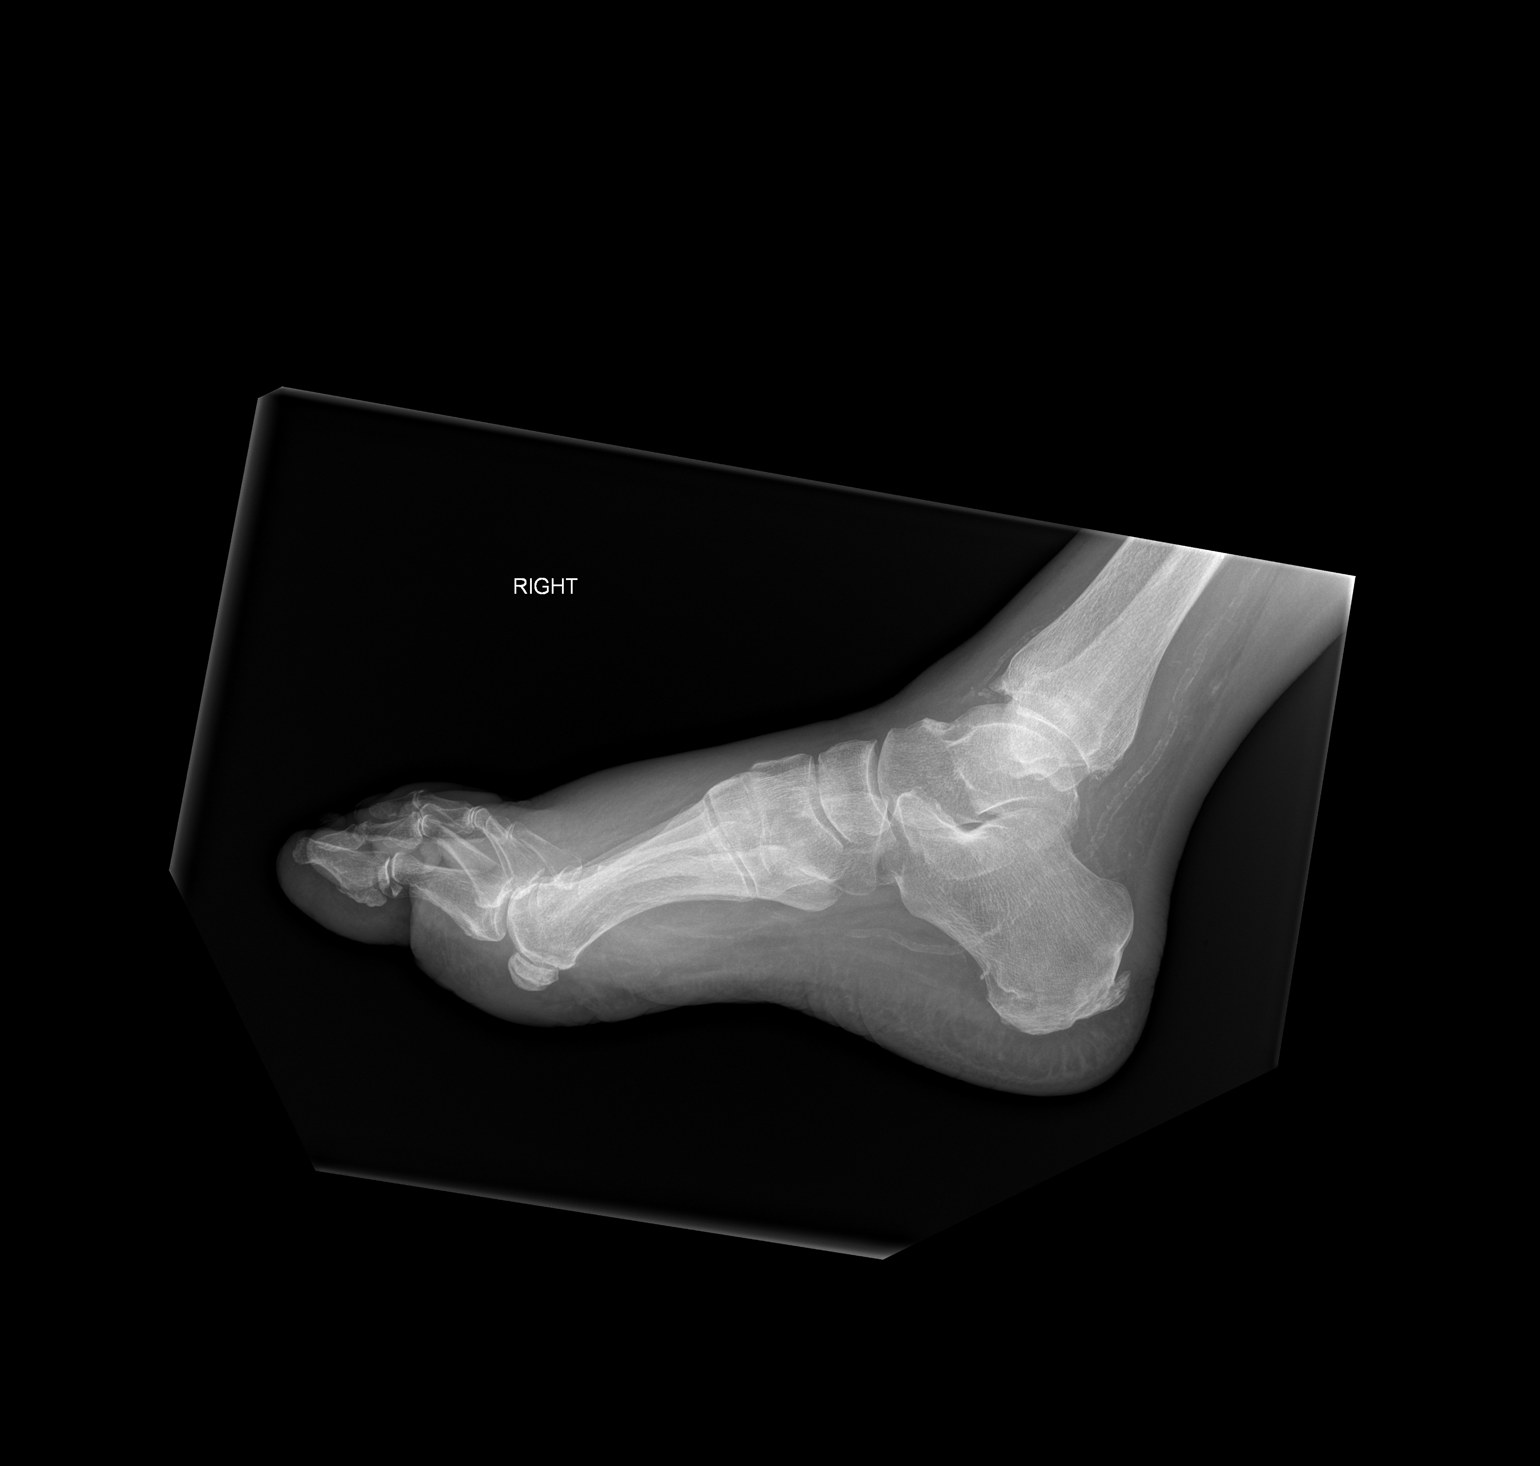

[3 of 3 positions shown; findings below may reference images not displayed]

FINDINGS: There is no evidence of fracture or dislocation. No definite osseous
erosions are seen. The known soft tissue ulceration is difficult to
fully characterize on radiograph. No radiopaque foreign bodies are
identified.

The joint spaces are preserved. There is no evidence of talar
subluxation; the subtalar joint is unremarkable in appearance.
Scattered vascular calcifications are seen.
IMPRESSION: No evidence of fracture or dislocation. No definite osseous erosion
seen. No radiopaque foreign bodies identified. Scattered vascular
calcifications seen.

## 2017-03-09 IMAGING — DX DG CHEST 2V
2 series · 2 of 2 positions shown · non-contrast
Comparison: 05/12/2014

CLINICAL DATA: Cough, pneumonia

EXAM:
CHEST  2 VIEW

[chest lat]
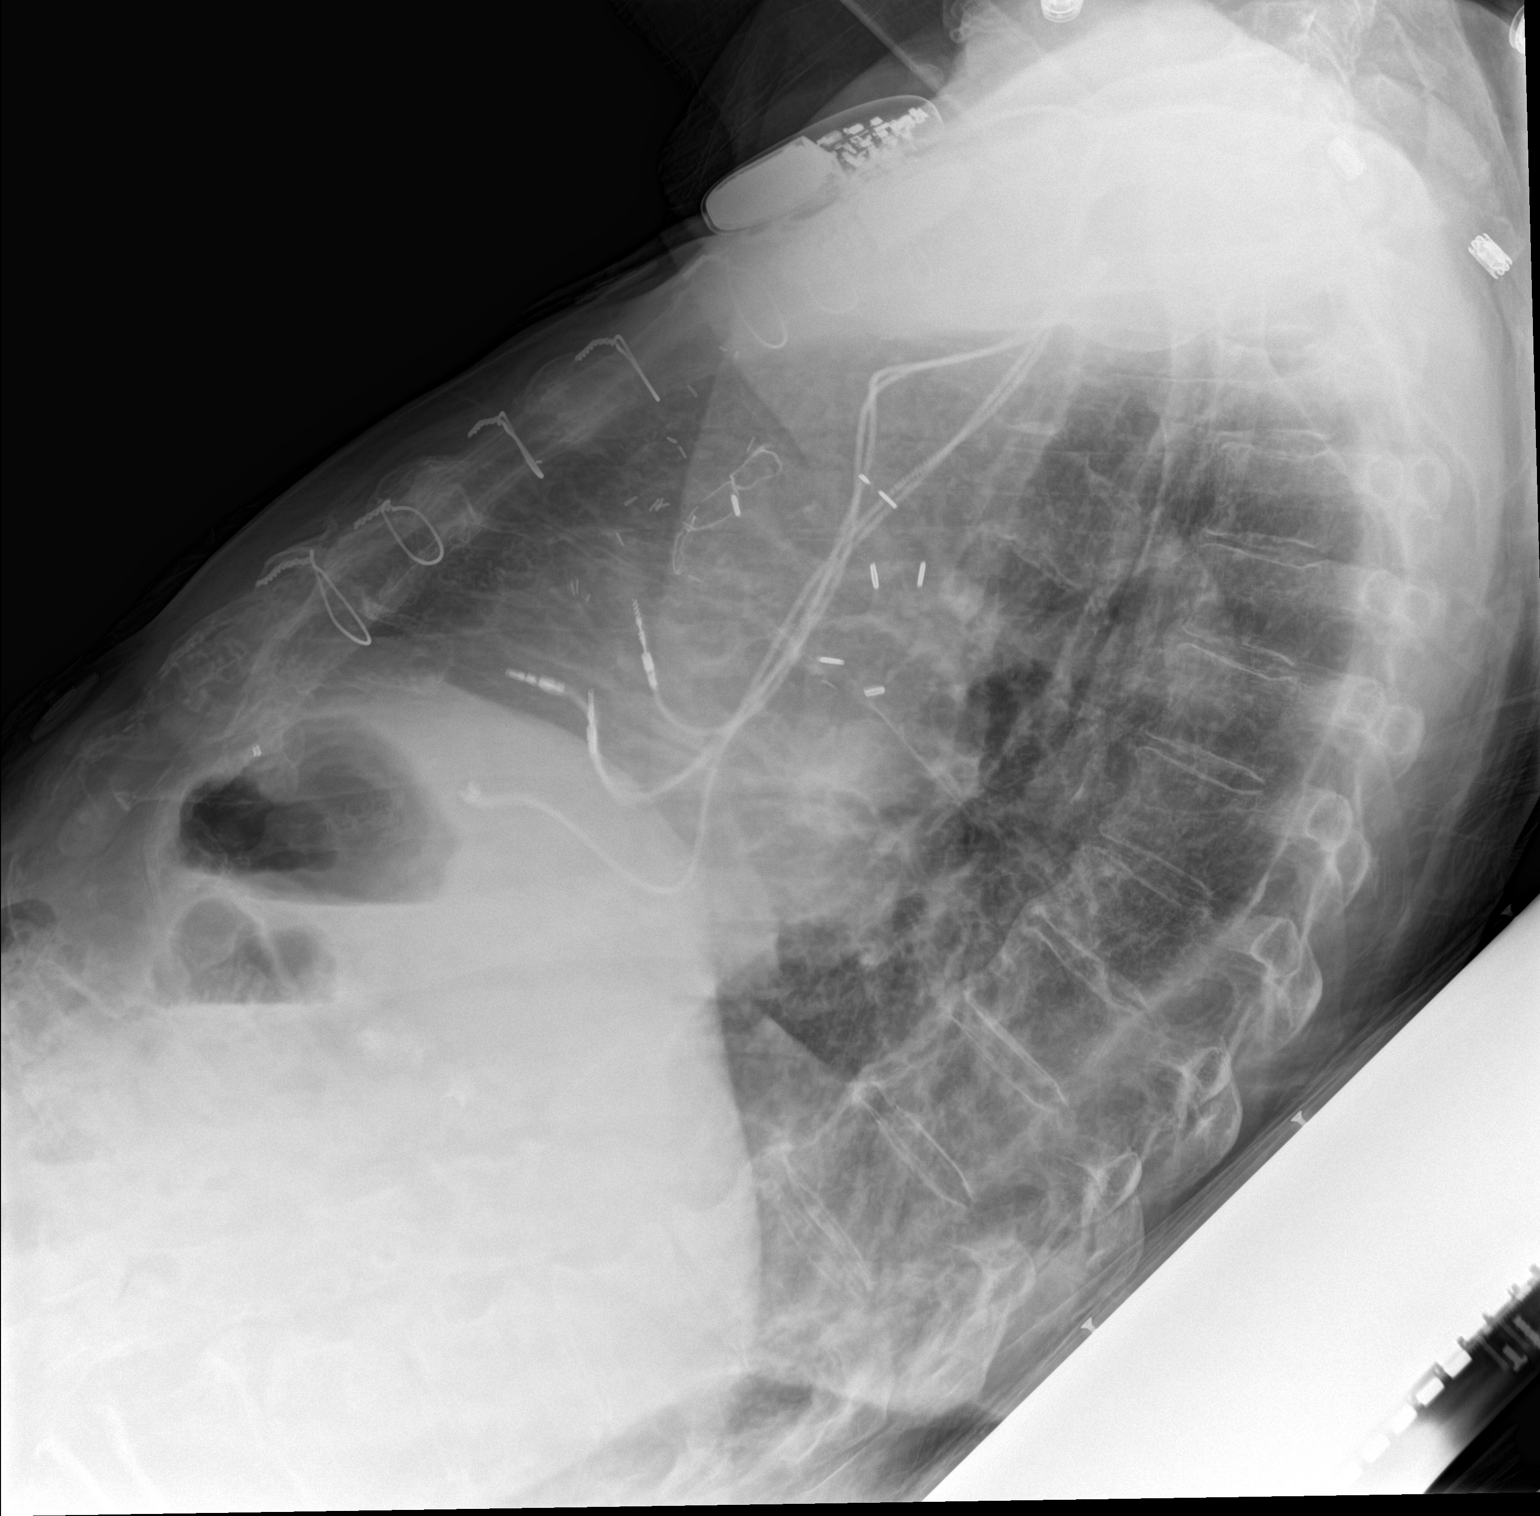

[chest ap]
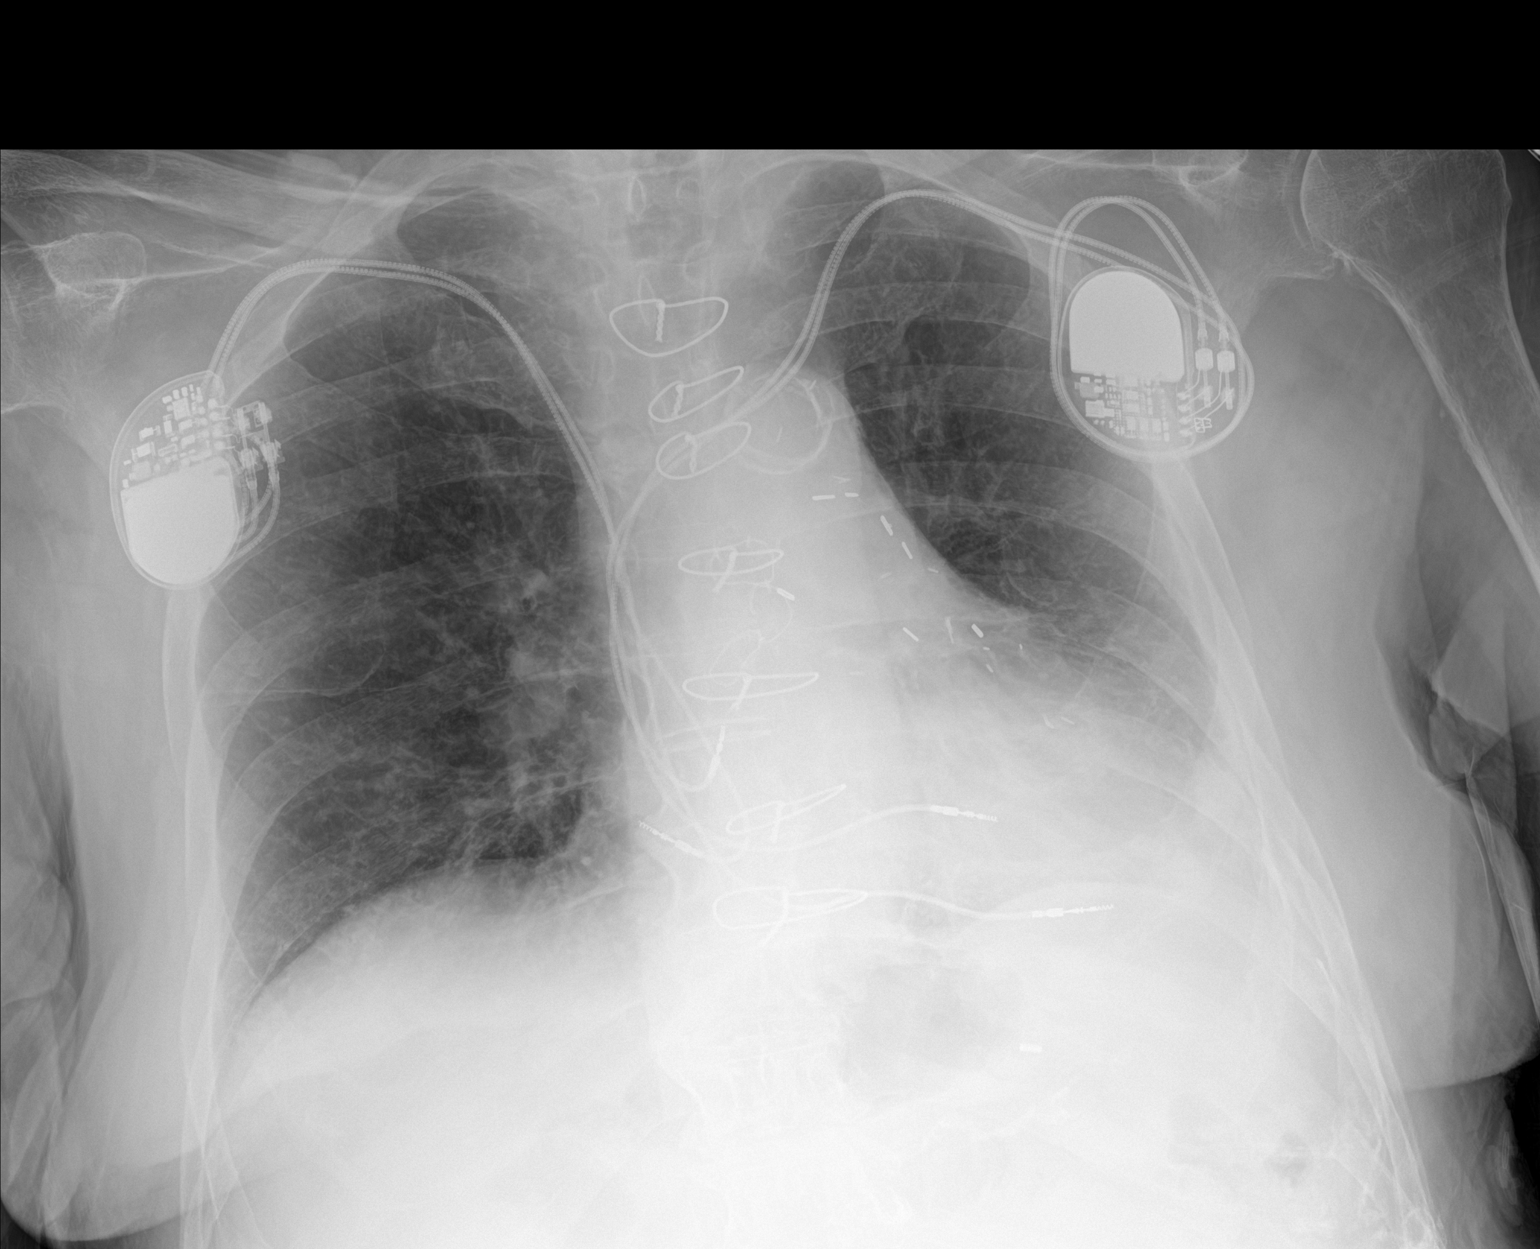

[2 of 2 positions shown; findings below may reference images not displayed]

FINDINGS: Cardiomediastinal silhouette is stable. Bilateral dual lead cardiac
pacemaker with leads in right atrium and right ventricle is
unchanged in position. Status post CABG. No acute infiltrate or
pulmonary edema. Stable pleural thickening and scarring left base.
Osteopenia and mild degenerative changes thoracic spine.
IMPRESSION: No active disease. Stable chronic changes left base. Status post
median sternotomy.
